# Patient Record
Sex: Female | Born: 1945
Health system: Southern US, Community
[De-identification: ages and names within clinical notes are randomized; demographics above are authoritative.]

## PROBLEM LIST (undated history)

## (undated) DIAGNOSIS — K0889 Other specified disorders of teeth and supporting structures: Secondary | ICD-10-CM

## (undated) DIAGNOSIS — K219 Gastro-esophageal reflux disease without esophagitis: Secondary | ICD-10-CM

## (undated) DIAGNOSIS — Z8719 Personal history of other diseases of the digestive system: Secondary | ICD-10-CM

## (undated) DIAGNOSIS — G8929 Other chronic pain: Secondary | ICD-10-CM

## (undated) DIAGNOSIS — J302 Other seasonal allergic rhinitis: Secondary | ICD-10-CM

## (undated) DIAGNOSIS — R112 Nausea with vomiting, unspecified: Secondary | ICD-10-CM

## (undated) DIAGNOSIS — R0602 Shortness of breath: Secondary | ICD-10-CM

## (undated) DIAGNOSIS — R634 Abnormal weight loss: Secondary | ICD-10-CM

## (undated) DIAGNOSIS — Z89432 Acquired absence of left foot: Secondary | ICD-10-CM

## (undated) DIAGNOSIS — M549 Dorsalgia, unspecified: Secondary | ICD-10-CM

## (undated) DIAGNOSIS — E46 Unspecified protein-calorie malnutrition: Secondary | ICD-10-CM

## (undated) DIAGNOSIS — Z789 Other specified health status: Secondary | ICD-10-CM

## (undated) DIAGNOSIS — K649 Unspecified hemorrhoids: Secondary | ICD-10-CM

## (undated) DIAGNOSIS — H9190 Unspecified hearing loss, unspecified ear: Secondary | ICD-10-CM

## (undated) DIAGNOSIS — M1990 Unspecified osteoarthritis, unspecified site: Secondary | ICD-10-CM

## (undated) DIAGNOSIS — J690 Pneumonitis due to inhalation of food and vomit: Secondary | ICD-10-CM

## (undated) DIAGNOSIS — R159 Full incontinence of feces: Secondary | ICD-10-CM

## (undated) DIAGNOSIS — K8020 Calculus of gallbladder without cholecystitis without obstruction: Secondary | ICD-10-CM

## (undated) DIAGNOSIS — M545 Low back pain, unspecified: Secondary | ICD-10-CM

## (undated) DIAGNOSIS — R600 Localized edema: Secondary | ICD-10-CM

## (undated) DIAGNOSIS — R269 Unspecified abnormalities of gait and mobility: Secondary | ICD-10-CM

## (undated) DIAGNOSIS — M419 Scoliosis, unspecified: Secondary | ICD-10-CM

## (undated) DIAGNOSIS — R32 Unspecified urinary incontinence: Secondary | ICD-10-CM

## (undated) DIAGNOSIS — D649 Anemia, unspecified: Secondary | ICD-10-CM

## (undated) DIAGNOSIS — Z9289 Personal history of other medical treatment: Secondary | ICD-10-CM

## (undated) DIAGNOSIS — Z9889 Other specified postprocedural states: Secondary | ICD-10-CM

## (undated) DIAGNOSIS — J42 Unspecified chronic bronchitis: Secondary | ICD-10-CM

## (undated) DIAGNOSIS — K56609 Unspecified intestinal obstruction, unspecified as to partial versus complete obstruction: Secondary | ICD-10-CM

## (undated) HISTORY — PX: CATARACT EXTRACTION W/ INTRAOCULAR LENS  IMPLANT, BILATERAL: SHX1307

## (undated) HISTORY — PX: TONSILLECTOMY AND ADENOIDECTOMY: SUR1326

## (undated) HISTORY — PX: KNEE ARTHROSCOPY: SUR90

## (undated) HISTORY — PX: APPENDECTOMY: SHX54

## (undated) HISTORY — PX: BLADDER REPAIR: SHX76

## (undated) HISTORY — PX: DOBUTAMINE STRESS ECHO: SHX5426

---

## 1973-07-15 DIAGNOSIS — Z9289 Personal history of other medical treatment: Secondary | ICD-10-CM

## 1973-07-15 HISTORY — DX: Personal history of other medical treatment: Z92.89

## 1973-07-15 HISTORY — PX: EXPLORATORY LAPAROTOMY: SUR591

## 1973-07-15 HISTORY — PX: ABDOMINAL HYSTERECTOMY: SHX81

## 1987-07-16 HISTORY — PX: BREAST LUMPECTOMY: SHX2

## 2004-09-20 ENCOUNTER — Other Ambulatory Visit: Admission: RE | Admit: 2004-09-20 | Discharge: 2004-09-20 | Payer: Self-pay | Admitting: Family Medicine

## 2011-08-06 ENCOUNTER — Other Ambulatory Visit: Payer: Self-pay | Admitting: Family Medicine

## 2011-08-06 DIAGNOSIS — R634 Abnormal weight loss: Secondary | ICD-10-CM

## 2011-08-12 ENCOUNTER — Ambulatory Visit
Admission: RE | Admit: 2011-08-12 | Discharge: 2011-08-12 | Disposition: A | Discharge status: Home / Self Care | Payer: Medicare Other | Source: Ambulatory Visit | Attending: Family Medicine | Admitting: Family Medicine

## 2011-08-12 ENCOUNTER — Other Ambulatory Visit: Payer: Self-pay | Admitting: Family Medicine

## 2011-08-12 ENCOUNTER — Other Ambulatory Visit: Payer: Self-pay

## 2011-08-12 DIAGNOSIS — R634 Abnormal weight loss: Secondary | ICD-10-CM

## 2011-08-12 MED ORDER — IOHEXOL 300 MG/ML  SOLN
100.0000 mL | Freq: Once | INTRAMUSCULAR | Status: AC | PRN
  Administered 2011-08-12: 100 mL via INTRAVENOUS

## 2011-09-23 ENCOUNTER — Encounter (HOSPITAL_BASED_OUTPATIENT_CLINIC_OR_DEPARTMENT_OTHER): Payer: Medicare Other | Attending: Internal Medicine

## 2011-09-23 DIAGNOSIS — Z9089 Acquired absence of other organs: Secondary | ICD-10-CM | POA: Insufficient documentation

## 2011-09-23 DIAGNOSIS — I872 Venous insufficiency (chronic) (peripheral): Secondary | ICD-10-CM | POA: Insufficient documentation

## 2011-09-23 DIAGNOSIS — Z79899 Other long term (current) drug therapy: Secondary | ICD-10-CM | POA: Insufficient documentation

## 2011-09-23 DIAGNOSIS — Z90710 Acquired absence of both cervix and uterus: Secondary | ICD-10-CM | POA: Insufficient documentation

## 2011-09-23 DIAGNOSIS — F17200 Nicotine dependence, unspecified, uncomplicated: Secondary | ICD-10-CM | POA: Insufficient documentation

## 2011-09-23 DIAGNOSIS — L97509 Non-pressure chronic ulcer of other part of unspecified foot with unspecified severity: Secondary | ICD-10-CM | POA: Insufficient documentation

## 2011-09-23 DIAGNOSIS — J4489 Other specified chronic obstructive pulmonary disease: Secondary | ICD-10-CM | POA: Insufficient documentation

## 2011-09-23 DIAGNOSIS — J449 Chronic obstructive pulmonary disease, unspecified: Secondary | ICD-10-CM | POA: Insufficient documentation

## 2011-09-27 NOTE — Progress Notes (Signed)
[facility]  NAME:  Ashley Howard, Ashley Howard NO.:  [id number]  MEDICAL RECORD NO.:  [id number]      DATE OF BIRTH:  1945-11-05  PHYSICIAN:  Ardath Sax, M.D.           VISIT DATE:                                  OFFICE VISIT   This is a 66 year old lady who was sent here by her family doctor because of chronic venous stasis ulcer on her left foot.  She has a history of being a smoker, has COPD.  She has a history of appendectomy, a hysterectomy, a lumpectomy of her breast, left knee surgery, and cataract surgery.  She is on several medicines including Lasix and Aldactone, and lisinopril.  She was examined here today with the findings of about a 2 x 2 cm ulcer on the first MP area of the left foot.  She has obvious venous stasis bilaterally and after going over her physically and deciding on what to do, we put her in Unna boots with collagen on the wound on her left foot, and we did debride some callus from the wound, and we will see her here in a week.  We also cultured the area and put her on a 2-week course of doxycycline.  So, our diagnosis is venous stasis ulcers, and we will see her here in a week.     Ardath Sax, M.D.     PP/MEDQ  D:  09/27/2011  T:  09/27/2011  Job:  [id number]

## 2011-10-14 ENCOUNTER — Encounter (HOSPITAL_BASED_OUTPATIENT_CLINIC_OR_DEPARTMENT_OTHER): Payer: Medicare Other | Attending: Internal Medicine

## 2011-10-14 DIAGNOSIS — L97509 Non-pressure chronic ulcer of other part of unspecified foot with unspecified severity: Secondary | ICD-10-CM | POA: Insufficient documentation

## 2011-10-14 DIAGNOSIS — I872 Venous insufficiency (chronic) (peripheral): Secondary | ICD-10-CM | POA: Insufficient documentation

## 2011-10-14 DIAGNOSIS — J4489 Other specified chronic obstructive pulmonary disease: Secondary | ICD-10-CM | POA: Insufficient documentation

## 2011-10-14 DIAGNOSIS — Z90710 Acquired absence of both cervix and uterus: Secondary | ICD-10-CM | POA: Insufficient documentation

## 2011-10-14 DIAGNOSIS — J449 Chronic obstructive pulmonary disease, unspecified: Secondary | ICD-10-CM | POA: Insufficient documentation

## 2011-10-21 ENCOUNTER — Encounter (HOSPITAL_BASED_OUTPATIENT_CLINIC_OR_DEPARTMENT_OTHER): Payer: Medicare Other

## 2011-11-06 ENCOUNTER — Other Ambulatory Visit (HOSPITAL_COMMUNITY): Payer: Self-pay | Admitting: Orthopedic Surgery

## 2011-11-06 ENCOUNTER — Encounter (HOSPITAL_COMMUNITY): Payer: Self-pay | Admitting: Respiratory Therapy

## 2011-11-06 ENCOUNTER — Encounter (HOSPITAL_COMMUNITY): Payer: Self-pay | Admitting: *Deleted

## 2011-11-06 MED ORDER — CEFAZOLIN SODIUM 1-5 GM-% IV SOLN
1.0000 g | INTRAVENOUS | Status: AC
  Administered 2011-11-07: 1 g via INTRAVENOUS
  Filled 2011-11-06: qty 50

## 2011-11-07 ENCOUNTER — Encounter (HOSPITAL_COMMUNITY): Payer: Self-pay | Admitting: Certified Registered Nurse Anesthetist

## 2011-11-07 ENCOUNTER — Encounter (HOSPITAL_COMMUNITY): Payer: Self-pay | Admitting: *Deleted

## 2011-11-07 ENCOUNTER — Ambulatory Visit (HOSPITAL_COMMUNITY): Payer: Medicare Other

## 2011-11-07 ENCOUNTER — Inpatient Hospital Stay (HOSPITAL_COMMUNITY)
Admission: RE | Admit: 2011-11-07 | Discharge: 2011-11-11 | DRG: 505 | Disposition: A | Discharge status: Skilled Nursing Facility | Payer: Medicare Other | Source: Ambulatory Visit | Attending: Orthopedic Surgery | Admitting: Orthopedic Surgery

## 2011-11-07 ENCOUNTER — Encounter (HOSPITAL_COMMUNITY)
Admission: RE | Disposition: A | Discharge status: Skilled Nursing Facility | Payer: Self-pay | Source: Ambulatory Visit | Attending: Orthopedic Surgery

## 2011-11-07 ENCOUNTER — Ambulatory Visit (HOSPITAL_COMMUNITY): Payer: Medicare Other | Admitting: Certified Registered Nurse Anesthetist

## 2011-11-07 DIAGNOSIS — R32 Unspecified urinary incontinence: Secondary | ICD-10-CM | POA: Diagnosis present

## 2011-11-07 DIAGNOSIS — M869 Osteomyelitis, unspecified: Principal | ICD-10-CM | POA: Diagnosis present

## 2011-11-07 DIAGNOSIS — K219 Gastro-esophageal reflux disease without esophagitis: Secondary | ICD-10-CM | POA: Diagnosis present

## 2011-11-07 DIAGNOSIS — M86179 Other acute osteomyelitis, unspecified ankle and foot: Secondary | ICD-10-CM

## 2011-11-07 DIAGNOSIS — Z888 Allergy status to other drugs, medicaments and biological substances status: Secondary | ICD-10-CM

## 2011-11-07 DIAGNOSIS — M4120 Other idiopathic scoliosis, site unspecified: Secondary | ICD-10-CM | POA: Diagnosis present

## 2011-11-07 DIAGNOSIS — Z9849 Cataract extraction status, unspecified eye: Secondary | ICD-10-CM

## 2011-11-07 DIAGNOSIS — M129 Arthropathy, unspecified: Secondary | ICD-10-CM | POA: Diagnosis present

## 2011-11-07 HISTORY — DX: Nausea with vomiting, unspecified: Z98.890

## 2011-11-07 HISTORY — PX: AMPUTATION: SHX166

## 2011-11-07 HISTORY — DX: Shortness of breath: R06.02

## 2011-11-07 HISTORY — DX: Unspecified osteoarthritis, unspecified site: M19.90

## 2011-11-07 HISTORY — DX: Calculus of gallbladder without cholecystitis without obstruction: K80.20

## 2011-11-07 HISTORY — DX: Unspecified urinary incontinence: R32

## 2011-11-07 HISTORY — DX: Abnormal weight loss: R63.4

## 2011-11-07 HISTORY — DX: Nausea with vomiting, unspecified: R11.2

## 2011-11-07 HISTORY — DX: Other seasonal allergic rhinitis: J30.2

## 2011-11-07 HISTORY — DX: Gastro-esophageal reflux disease without esophagitis: K21.9

## 2011-11-07 HISTORY — DX: Personal history of other diseases of the digestive system: Z87.19

## 2011-11-07 HISTORY — DX: Localized edema: R60.0

## 2011-11-07 HISTORY — DX: Full incontinence of feces: R15.9

## 2011-11-07 HISTORY — DX: Scoliosis, unspecified: M41.9

## 2011-11-07 LAB — CBC
HCT: 34 % — ABNORMAL LOW (ref 36.0–46.0)
Hemoglobin: 11.9 g/dL — ABNORMAL LOW (ref 12.0–15.0)
MCH: 34.4 pg — ABNORMAL HIGH (ref 26.0–34.0)
RBC: 3.46 MIL/uL — ABNORMAL LOW (ref 3.87–5.11)

## 2011-11-07 LAB — COMPREHENSIVE METABOLIC PANEL
ALT: 21 U/L (ref 0–35)
Alkaline Phosphatase: 96 U/L (ref 39–117)
BUN: 22 mg/dL (ref 6–23)
CO2: 25 mEq/L (ref 19–32)
Calcium: 9.7 mg/dL (ref 8.4–10.5)
GFR calc Af Amer: 83 mL/min — ABNORMAL LOW (ref >90)
GFR calc non Af Amer: 71 mL/min — ABNORMAL LOW (ref >90)
Glucose, Bld: 101 mg/dL — ABNORMAL HIGH (ref 70–99)
Potassium: 3.7 mEq/L (ref 3.5–5.1)
Sodium: 137 mEq/L (ref 135–145)

## 2011-11-07 SURGERY — AMPUTATION, FOOT, RAY
Anesthesia: General | Site: Foot | Laterality: Left | Wound class: Clean

## 2011-11-07 MED ORDER — SODIUM CHLORIDE 0.9 % IV SOLN
INTRAVENOUS | Status: DC
  Administered 2011-11-07: 20 mL/h via INTRAVENOUS

## 2011-11-07 MED ORDER — ADULT MULTIVITAMIN W/MINERALS CH
1.0000 | ORAL_TABLET | Freq: Every day | ORAL | Status: DC
  Administered 2011-11-07 – 2011-11-11 (×5): 1 via ORAL
  Filled 2011-11-07 (×5): qty 1

## 2011-11-07 MED ORDER — MUPIROCIN CALCIUM 2 % EX CREA
TOPICAL_CREAM | Freq: Two times a day (BID) | CUTANEOUS | Status: AC
  Filled 2011-11-07: qty 15

## 2011-11-07 MED ORDER — 0.9 % SODIUM CHLORIDE (POUR BTL) OPTIME
TOPICAL | Status: DC | PRN
  Administered 2011-11-07: 1000 mL

## 2011-11-07 MED ORDER — EPHEDRINE SULFATE 50 MG/ML IJ SOLN
INTRAMUSCULAR | Status: DC | PRN
  Administered 2011-11-07: 10 mg via INTRAVENOUS

## 2011-11-07 MED ORDER — SPIRONOLACTONE 25 MG PO TABS
25.0000 mg | ORAL_TABLET | Freq: Every day | ORAL | Status: DC
  Administered 2011-11-07 – 2011-11-11 (×4): 25 mg via ORAL
  Filled 2011-11-07 (×5): qty 1

## 2011-11-07 MED ORDER — ONDANSETRON HCL 4 MG/2ML IJ SOLN
INTRAMUSCULAR | Status: DC | PRN
  Administered 2011-11-07: 4 mg via INTRAVENOUS

## 2011-11-07 MED ORDER — MIDAZOLAM HCL 5 MG/5ML IJ SOLN
INTRAMUSCULAR | Status: DC | PRN
  Administered 2011-11-07: 1 mg via INTRAVENOUS

## 2011-11-07 MED ORDER — DEXTROSE 5 % IV SOLN
500.0000 mg | Freq: Four times a day (QID) | INTRAVENOUS | Status: DC | PRN
  Filled 2011-11-07: qty 5

## 2011-11-07 MED ORDER — HYDROMORPHONE HCL PF 1 MG/ML IJ SOLN: 0.5000 mg | INTRAMUSCULAR | Status: DC | PRN

## 2011-11-07 MED ORDER — METHOCARBAMOL 500 MG PO TABS
500.0000 mg | ORAL_TABLET | Freq: Four times a day (QID) | ORAL | Status: DC | PRN
  Filled 2011-11-07 (×2): qty 1

## 2011-11-07 MED ORDER — ONDANSETRON HCL 4 MG/2ML IJ SOLN: 4.0000 mg | Freq: Four times a day (QID) | INTRAMUSCULAR | Status: DC | PRN

## 2011-11-07 MED ORDER — CEFAZOLIN SODIUM 1-5 GM-% IV SOLN
1.0000 g | Freq: Four times a day (QID) | INTRAVENOUS | Status: AC
  Administered 2011-11-07 – 2011-11-08 (×3): 1 g via INTRAVENOUS
  Filled 2011-11-07 (×3): qty 50

## 2011-11-07 MED ORDER — ONDANSETRON HCL 4 MG PO TABS: 4.0000 mg | ORAL_TABLET | Freq: Four times a day (QID) | ORAL | Status: DC | PRN

## 2011-11-07 MED ORDER — METHOCARBAMOL 500 MG PO TABS
500.0000 mg | ORAL_TABLET | Freq: Four times a day (QID) | ORAL | Status: DC
  Administered 2011-11-07 – 2011-11-11 (×16): 500 mg via ORAL
  Filled 2011-11-07 (×20): qty 1

## 2011-11-07 MED ORDER — MUPIROCIN 2 % EX OINT
TOPICAL_OINTMENT | CUTANEOUS | Status: AC
  Administered 2011-11-07: 1
  Filled 2011-11-07: qty 22

## 2011-11-07 MED ORDER — BISACODYL 5 MG PO TBEC
5.0000 mg | DELAYED_RELEASE_TABLET | ORAL | Status: DC
  Administered 2011-11-07 – 2011-11-09 (×2): 5 mg via ORAL
  Filled 2011-11-07 (×3): qty 1

## 2011-11-07 MED ORDER — OXYCODONE HCL 5 MG PO TABS
5.0000 mg | ORAL_TABLET | ORAL | Status: DC | PRN
  Administered 2011-11-07 – 2011-11-11 (×13): 5 mg via ORAL
  Filled 2011-11-07 (×13): qty 1

## 2011-11-07 MED ORDER — OXYCODONE-ACETAMINOPHEN 5-325 MG PO TABS: 1.0000 | ORAL_TABLET | ORAL | Status: DC | PRN

## 2011-11-07 MED ORDER — DOXYCYCLINE HYCLATE 100 MG PO TABS
100.0000 mg | ORAL_TABLET | Freq: Two times a day (BID) | ORAL | Status: DC
  Administered 2011-11-07 – 2011-11-11 (×9): 100 mg via ORAL
  Filled 2011-11-07 (×11): qty 1

## 2011-11-07 MED ORDER — FUROSEMIDE 20 MG PO TABS
20.0000 mg | ORAL_TABLET | Freq: Every day | ORAL | Status: DC
  Administered 2011-11-07 – 2011-11-11 (×4): 20 mg via ORAL
  Filled 2011-11-07 (×5): qty 1

## 2011-11-07 MED ORDER — CALCIUM CARBONATE ANTACID 500 MG PO CHEW
1.0000 | CHEWABLE_TABLET | Freq: Three times a day (TID) | ORAL | Status: DC
  Administered 2011-11-07 – 2011-11-11 (×12): 200 mg via ORAL
  Filled 2011-11-07 (×15): qty 1

## 2011-11-07 MED ORDER — HYDROMORPHONE HCL PF 1 MG/ML IJ SOLN
0.2500 mg | INTRAMUSCULAR | Status: DC | PRN
  Administered 2011-11-07 (×3): 0.5 mg via INTRAVENOUS

## 2011-11-07 MED ORDER — FENTANYL CITRATE 0.05 MG/ML IJ SOLN
INTRAMUSCULAR | Status: DC | PRN
  Administered 2011-11-07: 25 ug via INTRAVENOUS
  Administered 2011-11-07: 100 ug via INTRAVENOUS
  Administered 2011-11-07: 25 ug via INTRAVENOUS

## 2011-11-07 MED ORDER — METOCLOPRAMIDE HCL 5 MG/ML IJ SOLN: 5.0000 mg | Freq: Three times a day (TID) | INTRAMUSCULAR | Status: DC | PRN

## 2011-11-07 MED ORDER — HYDROMORPHONE HCL PF 1 MG/ML IJ SOLN
INTRAMUSCULAR | Status: AC
  Filled 2011-11-07: qty 1

## 2011-11-07 MED ORDER — LACTATED RINGERS IV SOLN
INTRAVENOUS | Status: DC | PRN
  Administered 2011-11-07: 08:00:00 via INTRAVENOUS

## 2011-11-07 MED ORDER — HYDROCODONE-ACETAMINOPHEN 5-325 MG PO TABS: 1.0000 | ORAL_TABLET | ORAL | Status: DC | PRN

## 2011-11-07 MED ORDER — METOCLOPRAMIDE HCL 10 MG PO TABS: 5.0000 mg | ORAL_TABLET | Freq: Three times a day (TID) | ORAL | Status: DC | PRN

## 2011-11-07 MED ORDER — PROPOFOL 10 MG/ML IV EMUL
INTRAVENOUS | Status: DC | PRN
  Administered 2011-11-07: 120 mg via INTRAVENOUS

## 2011-11-07 SURGICAL SUPPLY — 49 items
BANDAGE ESMARK 6X9 LF (GAUZE/BANDAGES/DRESSINGS) IMPLANT
BANDAGE GAUZE ELAST BULKY 4 IN (GAUZE/BANDAGES/DRESSINGS) ×1 IMPLANT
BLADE SAW SGTL MED 73X18.5 STR (BLADE) IMPLANT
BNDG CMPR 9X6 STRL LF SNTH (GAUZE/BANDAGES/DRESSINGS)
BNDG COHESIVE 4X5 TAN STRL (GAUZE/BANDAGES/DRESSINGS) ×2 IMPLANT
BNDG COHESIVE 6X5 TAN STRL LF (GAUZE/BANDAGES/DRESSINGS) ×1 IMPLANT
BNDG ESMARK 6X9 LF (GAUZE/BANDAGES/DRESSINGS)
CLOTH BEACON ORANGE TIMEOUT ST (SAFETY) ×2 IMPLANT
CUFF TOURNIQUET SINGLE 34IN LL (TOURNIQUET CUFF) IMPLANT
CUFF TOURNIQUET SINGLE 44IN (TOURNIQUET CUFF) IMPLANT
DRAPE U-SHAPE 47X51 STRL (DRAPES) ×3 IMPLANT
DRSG ADAPTIC 3X8 NADH LF (GAUZE/BANDAGES/DRESSINGS) ×1 IMPLANT
DRSG EMULSION OIL 3X3 NADH (GAUZE/BANDAGES/DRESSINGS) ×1 IMPLANT
DRSG PAD ABDOMINAL 8X10 ST (GAUZE/BANDAGES/DRESSINGS) ×1 IMPLANT
DURAPREP 26ML APPLICATOR (WOUND CARE) ×2 IMPLANT
ELECT REM PT RETURN 9FT ADLT (ELECTROSURGICAL) ×2
ELECTRODE REM PT RTRN 9FT ADLT (ELECTROSURGICAL) ×1 IMPLANT
GLOVE BIOGEL PI IND STRL 7.0 (GLOVE) IMPLANT
GLOVE BIOGEL PI IND STRL 8 (GLOVE) IMPLANT
GLOVE BIOGEL PI IND STRL 9 (GLOVE) ×1 IMPLANT
GLOVE BIOGEL PI INDICATOR 7.0 (GLOVE) ×1
GLOVE BIOGEL PI INDICATOR 8 (GLOVE) ×1
GLOVE BIOGEL PI INDICATOR 9 (GLOVE) ×1
GLOVE SURG ORTHO 9.0 STRL STRW (GLOVE) ×2 IMPLANT
GLOVE SURG SS PI 7.0 STRL IVOR (GLOVE) ×1 IMPLANT
GLOVE SURG SS PI 7.5 STRL IVOR (GLOVE) ×1 IMPLANT
GOWN PREVENTION PLUS XLARGE (GOWN DISPOSABLE) ×1 IMPLANT
GOWN SRG XL XLNG 56XLVL 4 (GOWN DISPOSABLE) ×1 IMPLANT
GOWN STRL NON-REIN XL XLG LVL4 (GOWN DISPOSABLE) ×4
KIT BASIN OR (CUSTOM PROCEDURE TRAY) ×2 IMPLANT
KIT ROOM TURNOVER OR (KITS) ×2 IMPLANT
MANIFOLD NEPTUNE II (INSTRUMENTS) ×2 IMPLANT
NS IRRIG 1000ML POUR BTL (IV SOLUTION) ×2 IMPLANT
PACK ORTHO EXTREMITY (CUSTOM PROCEDURE TRAY) ×2 IMPLANT
PAD ARMBOARD 7.5X6 YLW CONV (MISCELLANEOUS) ×3 IMPLANT
PAD CAST 4YDX4 CTTN HI CHSV (CAST SUPPLIES) ×1 IMPLANT
PADDING CAST COTTON 4X4 STRL (CAST SUPPLIES)
SPECIMEN JAR MEDIUM (MISCELLANEOUS) ×1 IMPLANT
SPONGE GAUZE 4X4 12PLY (GAUZE/BANDAGES/DRESSINGS) ×2 IMPLANT
SPONGE LAP 18X18 X RAY DECT (DISPOSABLE) ×3 IMPLANT
STAPLER VISISTAT 35W (STAPLE) ×1 IMPLANT
STOCKINETTE IMPERVIOUS LG (DRAPES) IMPLANT
SUCTION FRAZIER TIP 10 FR DISP (SUCTIONS) ×2 IMPLANT
SUT ETHILON 2 0 PSLX (SUTURE) ×3 IMPLANT
TOWEL OR 17X24 6PK STRL BLUE (TOWEL DISPOSABLE) ×2 IMPLANT
TOWEL OR 17X26 10 PK STRL BLUE (TOWEL DISPOSABLE) ×2 IMPLANT
TUBE CONNECTING 12X1/4 (SUCTIONS) ×2 IMPLANT
UNDERPAD 30X30 INCONTINENT (UNDERPADS AND DIAPERS) ×2 IMPLANT
WATER STERILE IRR 1000ML POUR (IV SOLUTION) ×1 IMPLANT

## 2011-11-07 NOTE — Anesthesia Postprocedure Evaluation (Signed)
  Anesthesia Post-op Note  Patient: Ashley Howard  Procedure(s) Performed: Procedure(s) (LRB): AMPUTATION RAY (Left)  Patient Location: PACU  Anesthesia Type: General  Level of Consciousness: awake, alert  and oriented  Airway and Oxygen Therapy: Patient Spontanous Breathing  Post-op Pain: mild  Post-op Assessment: Post-op Vital signs reviewed, Patient's Cardiovascular Status Stable, Respiratory Function Stable, Patent Airway, No signs of Nausea or vomiting and Pain level controlled  Post-op Vital Signs: Reviewed and stable  Complications: No apparent anesthesia complications

## 2011-11-07 NOTE — Preoperative (Signed)
Beta Blockers   Reason not to administer Beta Blockers:Not Applicable 

## 2011-11-07 NOTE — Anesthesia Procedure Notes (Signed)
Procedure Name: LMA Insertion Date/Time: 11/07/2011 7:56 AM Performed by: Rogelia Boga Pre-anesthesia Checklist: Patient identified, Emergency Drugs available, Suction available, Patient being monitored and Timeout performed Patient Re-evaluated:Patient Re-evaluated prior to inductionOxygen Delivery Method: Circle system utilized Preoxygenation: Pre-oxygenation with 100% oxygen Intubation Type: IV induction LMA Size: 4.0 Number of attempts: 1 Placement Confirmation: positive ETCO2 and breath sounds checked- equal and bilateral Tube secured with: Tape Dental Injury: Teeth and Oropharynx as per pre-operative assessment

## 2011-11-07 NOTE — Progress Notes (Signed)
Orthopedic Tech Progress Note Patient Details:  Ashley Howard [birth year] [id number]  Other Ortho Devices Type of Ortho Device: Postop boot Ortho Device Interventions: Application   Cammer, Mickie Bail 11/07/2011, 9:05 AM

## 2011-11-07 NOTE — Transfer of Care (Signed)
Immediate Anesthesia Transfer of Care Note  Patient: Ashley Howard  Procedure(s) Performed: Procedure(s) (LRB): AMPUTATION RAY (Left)  Patient Location: PACU  Anesthesia Type: General  Level of Consciousness: awake, oriented and patient cooperative  Airway & Oxygen Therapy: Patient Spontanous Breathing and Patient connected to nasal cannula oxygen  Post-op Assessment: Report given to PACU RN  Post vital signs: Reviewed and stable  Complications: No apparent anesthesia complications

## 2011-11-07 NOTE — H&P (Signed)
Ashley Howard is an 66 y.o. female.   Chief Complaint: Abscess ulcer infection foot HPI: Patient is a 66 year old woman who presents with ulcer abscess of the great toe with foul smelling drainage exposed bone failed conservative treatment  Past Medical History  Diagnosis Date  . Incontinence of urine     wears adult briefs  . Gallstones   . Fluid retention in legs     wears compression stocking R leg  . PONV (postoperative nausea and vomiting)   . Seasonal allergies   . Weight loss, unintentional     40 lbs in last year  . Shortness of breath     with pain  . GERD (gastroesophageal reflux disease)   . H/O hiatal hernia   . Arthritis   . Scoliosis   . Bowel incontinence     Past Surgical History  Procedure Date  . Knee arthroscopy     left  . Dobutamine stress echo   . Breast surgery 1989    lumpectomy bilat  . Tonsillectomy   . Adenoidectomy   . Eye surgery     bilat cataracts  . Appendectomy   . Abdominal hysterectomy 1975  . Bladder repair     History reviewed. No pertinent family history. Social History:  reports that she has been smoking.  She does not have any smokeless tobacco history on file. She reports that she drinks alcohol. She reports that she does not use illicit drugs.  Allergies:  Allergies  Allergen Reactions  . Tape Other (See Comments)    Paper tape - blisters  . Tylenol (Acetaminophen) Other (See Comments)    Stop from urinating     Medications Prior to Admission  Medication Sig Dispense Refill  . bisacodyl (DULCOLAX) 5 MG EC tablet Take 5 mg by mouth once a week.      . doxycycline (VIBRA-TABS) 100 MG tablet Take 100 mg by mouth 2 (two) times daily.      . furosemide (LASIX) 20 MG tablet Take 20 mg by mouth daily.      Marland Kitchen guaifenesin (ROBITUSSIN) 100 MG/5ML syrup Take 200 mg by mouth daily as needed.      . methocarbamol (ROBAXIN) 500 MG tablet Take 500 mg by mouth 4 (four) times daily.      . naproxen sodium (ANAPROX) 220 MG  tablet Take 440 mg by mouth 3 (three) times daily with meals.      . pseudoephedrine-guaifenesin (MUCINEX D) 60-600 MG per tablet Take 1 tablet by mouth every 12 (twelve) hours.      Marland Kitchen spironolactone (ALDACTONE) 25 MG tablet Take 25 mg by mouth daily.      . calcium carbonate (TUMS - DOSED IN MG ELEMENTAL CALCIUM) 500 MG chewable tablet Chew 1 tablet by mouth 3 (three) times daily before meals.      . Multiple Vitamin (MULITIVITAMIN WITH MINERALS) TABS Take 1 tablet by mouth daily.        Results for orders placed during the hospital encounter of 11/07/11 (from the past 48 hour(s))  APTT     Status: Normal   Collection Time   11/07/11  6:51 AM      Component Value Range Comment   aPTT 34  24 - 37 (seconds)    No results found.  Review of Systems  All other systems reviewed and are negative.    Blood pressure 114/68, pulse 78, temperature 97.9 F (36.6 C), temperature source Oral, resp. rate 20, height 5\' 2"  (1.575  m), weight 48.535 kg (107 lb), SpO2 99.00%. Physical Exam  Examination patient does have good pulses, she has a Wagner grade 3 ulcer on the plantar aspect of the MTP joint. The ulcer also does probe to bone. Patient does have radiographic findings which showed lytic changes at the MTP joint. Assessment/Plan Assessment: Osteomyelitis MTP joint of the foot.  Plan: Plan for first ray amputation. Risks and benefits were discussed including infection neurovascular injury nonhealing of the wound need for additional surgery. Patient states she understands and wishes to proceed at this time.  Ashley Howard V 11/07/2011, 7:20 AM

## 2011-11-07 NOTE — Op Note (Signed)
OPERATIVE REPORT  DATE OF SURGERY: 11/07/2011  PATIENT:  Ashley Howard,  66 y.o. female  PRE-OPERATIVE DIAGNOSIS:  Abscess and Osteomyelitis Left Great Toe MTP Joint  POST-OPERATIVE DIAGNOSIS:  Abscess and Osteomyelitis Left Great Toe MTP Joint  PROCEDURE:  Procedure(s): AMPUTATION RAY Left foot first ray  SURGEON:  Surgeon(s): Nadara Mustard, MD  ANESTHESIA:   general  EBL:  Minimal ML  SPECIMEN:  No Specimen and Source of Specimen:  Left foot first ray  TOURNIQUET:  * No tourniquets in log *  PROCEDURE DETAILS: Patient is a 66 year old woman with insensate neuropathy with [name] grade 3 ulcer left foot great toe MTP joint. She has failed conservative care radiograph shows osteomyelitis the ulcer probes to bone she has good pulses and she presents at this time for amputation of the first ray after failure of conservative care. Risks and benefits were discussed including infection neurovascular injury persistent pain nonhealing of the wound need for additional surgery. Patient states she understands and wished to proceed at this time. Description of procedure patient was brought to OR room 15 and underwent a general anesthetic. After adequate levels of anesthesia were obtained patient's left foot was prepped using DuraPrep and draped into a sterile field. A racquet incision was made around the ulcer the first ray was resected at the base of the first metatarsal. The wound was irrigated with normal saline hemostasis was obtained. The incision was closed using 2-0 nylon. The wound was covered with Adaptic orthopedic sponges ABDs dressing Kerlix and Coban. Patient was extubated and taken to the PACU in stable condition.  PLAN OF CARE: Admit to inpatient   PATIENT DISPOSITION:  PACU - hemodynamically stable.   Nadara Mustard, MD 11/07/2011 8:23 AM

## 2011-11-07 NOTE — Anesthesia Preprocedure Evaluation (Addendum)
Anesthesia Evaluation  Patient identified by MRN, date of birth, ID band Patient awake    Reviewed: Allergy & Precautions, H&P , NPO status , Patient's Chart, lab work & pertinent test results  History of Anesthesia Complications (+) PONV  Airway Mallampati: II TM Distance: >3 FB Neck ROM: Full    Dental  (+) Dental Advisory Given, Loose and Poor Dentition   Pulmonary shortness of breath and with exertion, Current Smoker,  breath sounds clear to auscultation  Pulmonary exam normal       Cardiovascular hypertension (on diurectics), Pt. on medications + Peripheral Vascular Disease negative cardio ROS  Rhythm:Regular Rate:Normal     Neuro/Psych Chronic pain negative psych ROS   GI/Hepatic hiatal hernia, GERD-  Medicated and Controlled,H/o elevated LFTs   Endo/Other  negative endocrine ROS  Renal/GU negative Renal ROS     Musculoskeletal  (+) Arthritis -, Osteoarthritis,    Abdominal   Peds  Hematology   Anesthesia Other Findings   Reproductive/Obstetrics                          Anesthesia Physical Anesthesia Plan  ASA: III  Anesthesia Plan: General   Post-op Pain Management:    Induction: Intravenous  Airway Management Planned: LMA  Additional Equipment:   Intra-op Plan:   Post-operative Plan:   Informed Consent: I have reviewed the patients History and Physical, chart, labs and discussed the procedure including the risks, benefits and alternatives for the proposed anesthesia with the patient or authorized representative who has indicated his/her understanding and acceptance.   Dental advisory given  Plan Discussed with: Surgeon and CRNA  Anesthesia Plan Comments: (Plan routine monitors, GA- LMA OK)       Anesthesia Quick Evaluation

## 2011-11-08 ENCOUNTER — Encounter (HOSPITAL_COMMUNITY): Payer: Self-pay | Admitting: Orthopedic Surgery

## 2011-11-08 MED ORDER — MUPIROCIN 2 % EX OINT
TOPICAL_OINTMENT | Freq: Two times a day (BID) | CUTANEOUS | Status: DC
  Administered 2011-11-08 – 2011-11-11 (×7): via NASAL
  Filled 2011-11-08: qty 22

## 2011-11-08 MED ORDER — CHLORHEXIDINE GLUCONATE CLOTH 2 % EX PADS
6.0000 | MEDICATED_PAD | Freq: Every day | CUTANEOUS | Status: DC
  Administered 2011-11-08 – 2011-11-09 (×2): 6 via TOPICAL

## 2011-11-08 NOTE — Progress Notes (Signed)
UR Completed.  Sary Bogie Jane [phone] 11/08/2011  

## 2011-11-08 NOTE — Progress Notes (Signed)
Clinical Social Work Department BRIEF PSYCHOSOCIAL ASSESSMENT 11/08/2011  Patient:  Ashley Howard     Account Number:  [id number]     Admit date:  11/07/2011  Clinical Social Worker:  Peggyann Shoals  Date/Time:  11/08/2011 03:30 PM  Referred by:  Physician  Date Referred:  11/08/2011 Referred for  SNF Placement   Other Referral:   Interview type:  Patient Other interview type:    PSYCHOSOCIAL DATA Living Status:  HUSBAND Admitted from facility:   Level of care:   Primary support name:  Ashley Howard Primary support relationship to patient:  SPOUSE Degree of support available:   adequate, however not able to provide support at home.    CURRENT CONCERNS Current Concerns  Post-Acute Placement   Other Concerns:    SOCIAL WORK ASSESSMENT / PLAN CSW met with pt to address cosnult. CSW explained process of SNF as PT is recommending SNF at discharge. Pt lives with her husand and has limited support. Pt is agreeable to SNF   Assessment/plan status:  Other - See comment Other assessment/ plan:   CSW initated SNF search for [facility] and [facility]. CSW will follow up with bed offers. CSW will facilitate discharge to SNF when medically ready.   Information/referral to community resources:   as needed.    PATIENT'S/FAMILY'S RESPONSE TO PLAN OF CARE: Pt was pleasant and oreinted. Pt is agreeable to discharge plan to SNF.   Dede Query, MSW, Theresia Majors [phone]

## 2011-11-08 NOTE — Progress Notes (Addendum)
Clinical Social Work Department CLINICAL SOCIAL WORK PLACEMENT NOTE 11/08/2011  Patient:  Ashley Howard  Account Number:  [id number] Admit date:  11/07/2011  Clinical Social Worker:  Peggyann Shoals  Date/time:  11/08/2011 03:30 PM  Clinical Social Work is seeking post-discharge placement for this patient at the following level of care:   SKILLED NURSING   (*CSW will update this form in Epic as items are completed)   11/08/2011  Patient/family provided with [facility] Department of Clinical Social Work's list of facilities offering this level of care within the geographic area requested by the patient (or if unable, by the patient's family).  11/08/2011  Patient/family informed of their freedom to choose among providers that offer the needed level of care, that participate in Medicare, Medicaid or managed care program needed by the patient, have an available bed and are willing to accept the patient.  11/08/2011  Patient/family informed of [facility]' ownership interest in [facility], as well as of the fact that they are under no obligation to receive care at this facility.  PASARR submitted to EDS on 11/08/2011 PASARR number received from EDS on 11/11/11  FL2 transmitted to all facilities in geographic area requested by pt/family on  11/08/2011 FL2 transmitted to all facilities within larger geographic area on   Patient informed that his/her managed care company has contracts with or will negotiate with  certain facilities, including the following:     Patient/family informed of bed offers received:  11/10/11 [facility] Patient chooses bed at 11/11/11 [facility]B Physician recommends and patient chooses bed at  [facility]  Patient to be transferred to  on  11/11/11 JB Patient to be transferred to facility by [facility] Level  The following physician request were entered in Epic:   Additional Comments:  Dede Query, MSW, LCSWA [phone]

## 2011-11-08 NOTE — Progress Notes (Signed)
Physical Therapy Evaluation Note  Past Medical History  Diagnosis Date  . Incontinence of urine     wears adult briefs  . Gallstones   . Fluid retention in legs     wears compression stocking R leg  . PONV (postoperative nausea and vomiting)   . Seasonal allergies   . Weight loss, unintentional     40 lbs in last year  . Shortness of breath     with pain  . GERD (gastroesophageal reflux disease)   . H/O hiatal hernia   . Arthritis   . Scoliosis   . Bowel incontinence     Past Surgical History  Procedure Date  . Knee arthroscopy     left  . Dobutamine stress echo   . Breast surgery 1989    lumpectomy bilat  . Tonsillectomy   . Adenoidectomy   . Eye surgery     bilat cataracts  . Appendectomy   . Abdominal hysterectomy 1975  . Bladder repair      11/08/11 0909  PT Visit Information  Last PT Received On 11/08/11  PT Time Calculation  PT Start Time 0909  PT Stop Time 0938  PT Time Calculation (min) 29 min  Subjective Data  Subjective Pt received supine in bed with c/o 3/10 L foot pain, throbbing.  Precautions  Precautions Fall  Restrictions  LLE Weight Bearing TWB  Home Living  Lives With Spouse  Available Help at Discharge (none)  Type of Home House  Home Access Stairs to enter  Entrance Stairs-Number of Steps 3  Entrance Stairs-Rails None  Home Layout Two level;Able to live on main level with bedroom/bathroom  Alternate Level Stairs-Number of Steps 12  Alternate Level Stairs-Rails Right  Bathroom Shower/Tub Tub/shower unit  Horticulturist, commercial No  Home Adaptive Equipment (rolling walker)  Additional Comments pt spouse unable to assist patient at home and has no other people to assist  Prior Function  Level of Independence Independent with assistive device(s) (straight cane)  Able to Take Stairs? Yes  Driving No  Vocation Retired  Geneticist, molecular No difficulties  Cognition  Overall Cognitive Status  Appears within functional limits for tasks assessed/performed  Arousal/Alertness Awake/alert  Orientation Level Oriented X4 / Intact  Behavior During Session [facility] for tasks performed  Cognition - Other Comments pt easily distracted  Right Upper Extremity Assessment  RUE ROM/Strength/Tone WFL  Left Upper Extremity Assessment  LUE ROM/Strength/Tone WFL  Right Lower Extremity Assessment  RLE ROM/Strength/Tone WFL  Left Lower Extremity Assessment  LLE ROM/Strength/Tone WFL (except ankle/foot due to surgery)  Trunk Assessment  Trunk Assessment Normal  Bed Mobility  Bed Mobility Rolling Left;Left Sidelying to Sit  Rolling Left 4: Min assist;With rail  Left Sidelying to Sit 4: Min assist;HOB flat  Details for Bed Mobility Assistance v/c's for sequencing, increased time required  Transfers  Transfers Sit to Stand;Stand to Sit  Sit to Stand 4: Min assist;From chair/3-in-1  Stand to Sit 4: Min assist;To chair/3-in-1  Details for Transfer Assistance v/c's for L LW TWB, pt however maintained NWB L LE without difficulty  Ambulation/Gait  Ambulation/Gait Assistance 4: Min assist  Ambulation Distance (Feet) 20 Feet  Assistive device Rolling walker  Ambulation/Gait Assistance Details max directional verbal cues for sequencing of walker due to poor safety awareness  Gait Pattern Step-to pattern  Gait velocity slow  Stairs No  Balance  Balance Assessed Yes  Static Standing Balance  Static Standing - Balance Support Bilateral upper  extremity supported  Static Standing - Level of Assistance 4: Min assist (mod/maxA without UE support)  Static Standing - Comment/# of Minutes pt unsteady with UE support, need bilat UE support  PT - End of Session  Equipment Utilized During Treatment Gait belt (L post op shoe)  Activity Tolerance Patient limited by fatigue;Patient limited by pain  Patient left in chair;with call bell/phone within reach  Nurse Communication (need for SW c/s due to pt unsafe to  return home)  PT Assessment  Clinical Impression Statement Pt s/p  L toe amputation presenting with L LE TWB. Patient requires 24/7 supervision for safety due to increased fall risk and requires assist for ADls. Patient unsafet to return home due to spouse unable to assist patient and has no other people to assist. Patient unable to safely enter home due to spouse inability to hold walker for patient to safely enter stairs or bump patient up in w/c. Patient to benefit from SNF placement to achieve I function for safe transition home.  PT Recommendation/Assessment Patient needs continued PT services  PT Problem List Decreased strength;Decreased range of motion;Decreased activity tolerance;Decreased balance  Barriers to Discharge Inaccessible home environment;Decreased caregiver support  PT Therapy Diagnosis  Difficulty walking;Abnormality of gait;Generalized weakness;Acute pain  PT Plan  PT Frequency Min 5X/week  PT Treatment/Interventions DME instruction;Gait training;Stair training;Functional mobility training;Therapeutic activities;Therapeutic exercise  PT Recommendation  Recommendations for Other Services (Social work c/s)  Follow Up Recommendations Skilled nursing facility;Supervision/Assistance - 24 hour  Equipment Recommended Defer to next venue  Individuals Consulted  Consulted and Agree with Results and Recommendations Patient  Acute Rehab PT Goals  PT Goal Formulation With patient  Time For Goal Achievement 11/22/11  Potential to Achieve Goals Good  Pt will go Supine/Side to Sit with modified independence;with HOB 0 degrees  PT Goal: Supine/Side to Sit - Progress Goal set today  Pt will go Sit to Stand with modified independence (up to RW.)  PT Goal: Sit to Stand - Progress Goal set today  Pt will go Stand to Sit with modified independence  PT Goal: Stand to Sit - Progress Goal set today  Pt will Ambulate 51 - 150 feet;with modified independence;with rolling walker  PT Goal:  Ambulate - Progress Goal set today  Pt will Perform Home Exercise Program Independently  PT Goal: Perform Home Exercise Program - Progress Goal set today  Written Expression  Dominant Hand Right    Pain: 5/10 L LE  Lewis Shock, PT, DPT Pager #: [phone] Office #: [phone]

## 2011-11-08 NOTE — Progress Notes (Signed)
   CARE MANAGEMENT NOTE 11/08/2011  Patient:  Ashley Howard   Account Number:  [id number]  Date Initiated:  11/08/2011  Documentation initiated by:  Carlitos Bottino  Subjective/Objective Assessment:   CM received order for [facility] needs, however pt no progressing adequately and husband unable to assist.     Action/Plan:   CSW notified that pt may need short term SNF   Anticipated DC Date:  11/10/2011   Anticipated DC Plan:  SKILLED NURSING FACILITY         Choice offered to / List presented to:             Status of service:  Completed, signed off Medicare Important Message given?   (If response is "NO", the following Medicare IM given date fields will be blank) Date Medicare IM given:   Date Additional Medicare IM given:    Discharge Disposition:  SKILLED NURSING FACILITY  Per UR Regulation:  Reviewed for med. necessity/level of care/duration of stay  If discussed at Long Length of Stay Meetings, dates discussed:    Comments:

## 2011-11-08 NOTE — Progress Notes (Signed)
Patient ID: Ashley Howard, female   DOB: [birth year], 66 y.o.   MRN: [id number] Postoperative day 1 left foot first ray amputation. Patient states she feels uncomfortable with discharge to home. She will work with physical therapy with a Darco shoe on the left foot possibly discharge to home on Sunday.

## 2011-11-08 NOTE — Progress Notes (Signed)
Rept to Dr Lajoyce Corners regarding pt's BP 105/69 pulse 86, pt's failure to progress with PT and no help at home, +MSSA status. Orders received to hold lasix and aldactone today, MSW consult for SNF placement, mupirocin ointment intra nasally BID and CHG cloth wipes. Also repted to Dr. Lajoyce Corners regarding pt was found to have a tick attached to her skin on her L side this AM. Tick was completely removed and red area was circled. As per his order, will continue to monitor area.

## 2011-11-09 NOTE — Progress Notes (Signed)
Physical Therapy Treatment Patient Details Name: Ashley Howard MRN: [id number] DOB: 01-18-1946 Today's Date: 11/09/2011 Time: 8657-8469 PT Time Calculation (min): 30 min  PT Assessment / Plan / Recommendation Comments on Treatment Session  Pt easily distracted and gets off topic quickly.  Cues to stay on task and for safety. Pt desires SNF secondary to husband unable to assist at home.    Follow Up Recommendations  Skilled nursing facility;Supervision/Assistance - 24 hour    Equipment Recommendations  Defer to next venue    Frequency Min 5X/week   Plan Discharge plan remains appropriate    Precautions / Restrictions Precautions Precautions: Fall Restrictions Weight Bearing Restrictions: Yes LLE Weight Bearing: Touchdown weight bearing   Pertinent Vitals/Pain Pt c/o L foot pain with dependent position but did not rate.    Mobility  Bed Mobility Bed Mobility: Supine to Sit Supine to Sit: 4: Min assist;HOB flat;With rails Details for Bed Mobility Assistance: Increased time required and verbal cues Transfers Sit to Stand: 4: Min assist;With upper extremity assist;From bed Stand to Sit: 4: Min assist;With upper extremity assist;With armrests;To chair/3-in-1 Details for Transfer Assistance: Pt maintaining NWB Ambulation/Gait Ambulation/Gait Assistance: 4: Min assist Ambulation Distance (Feet): 30 Feet Assistive device: Rolling walker Ambulation/Gait Assistance Details: Min cues for walker safety.  Able to maintain NWB during ambulation. Gait Pattern: Step-to pattern Gait velocity: slow Stairs: No Wheelchair Mobility Wheelchair Mobility: No    Exercises     PT Goals Acute Rehab PT Goals Time For Goal Achievement: 11/22/11 Potential to Achieve Goals: Good PT Goal: Supine/Side to Sit - Progress: Progressing toward goal PT Goal: Sit to Stand - Progress: Progressing toward goal PT Goal: Stand to Sit - Progress: Progressing toward goal PT Goal: Ambulate - Progress:  Progressing toward goal  Visit Information  Last PT Received On: 11/09/11 Assistance Needed: +1    Subjective Data  Subjective: Pt extremely talkative and off task at times.   Cognition  Overall Cognitive Status: Appears within functional limits for tasks assessed/performed Arousal/Alertness: Awake/alert Orientation Level: Oriented X4 / Intact Behavior During Session: WFL for tasks performed Cognition - Other Comments: pt easily distracted    Balance  Balance Balance Assessed: No  End of Session PT - End of Session Equipment Utilized During Treatment: Gait belt Activity Tolerance: Patient tolerated treatment well Patient left: in chair;with call bell/phone within reach Nurse Communication: Mobility status    Newell Coral 11/09/2011, 12:46 PM  Newell Coral, PTA Acute Rehab [phone] (office)

## 2011-11-09 NOTE — Progress Notes (Signed)
Patient ID: Ashley Howard, female   DOB: [birth year], 66 y.o.   MRN: [id number] Postoperative day 2 amputation left foot first ray. Patient is slow with therapy. Her husband is unable to care for her at home to to his multiple medical problems. Patient will benefit from short-term skilled nursing treatment. I felt to was signed for short term skilled nursing discharge. Plan discharge Monday.

## 2011-11-10 MED ORDER — MAGNESIUM CITRATE PO SOLN
1.0000 | Freq: Once | ORAL | Status: AC
  Administered 2011-11-10: 1 via ORAL
  Filled 2011-11-10: qty 296

## 2011-11-10 NOTE — Progress Notes (Signed)
Subjective: 3 Days Post-Op Procedure(s) (LRB): AMPUTATION RAY (Left) Patient reports pain as moderate.    Objective: Vital signs in last 24 hours: Temp:  [98.7 F (37.1 C)-99.4 F (37.4 C)] 98.7 F (37.1 C) (04/28 0636) Pulse Rate:  [77-88] 77  (04/28 0636) Resp:  [18-20] 20  (04/28 0636) BP: (106-125)/(66-82) 125/82 mmHg (04/28 0636) SpO2:  [98 %-99 %] 99 % (04/28 0636)  Intake/Output from previous day:   Intake/Output this shift:    No results found for this basename: HGB:5 in the last 72 hours No results found for this basename: WBC:2,RBC:2,HCT:2,PLT:2 in the last 72 hours No results found for this basename: NA:2,K:2,CL:2,CO2:2,BUN:2,CREATININE:2,GLUCOSE:2,CALCIUM:2 in the last 72 hours No results found for this basename: LABPT:2,INR:2 in the last 72 hours  Neurologically intact  Assessment/Plan: 3 Days Post-Op Procedure(s) (LRB): AMPUTATION RAY (Left) Discharge to SNF  DUDA,MARCUS V 11/10/2011, 8:06 AM

## 2011-11-11 MED ORDER — WARFARIN SODIUM 1 MG PO TABS: 1.0000 mg | ORAL_TABLET | Freq: Every day | ORAL | Status: DC

## 2011-11-11 MED ORDER — HYDROCODONE-ACETAMINOPHEN 5-500 MG PO TABS: 1.0000 | ORAL_TABLET | Freq: Four times a day (QID) | ORAL | Status: AC | PRN

## 2011-11-11 MED ORDER — OXYCODONE-ACETAMINOPHEN 5-325 MG PO TABS: 1.0000 | ORAL_TABLET | ORAL | Status: AC | PRN

## 2011-11-11 NOTE — Discharge Summary (Signed)
Physician Discharge Summary  Patient ID: Ashley Howard MRN: [id number] DOB/AGE: 02/23/1946 66 y.o.  Admit date: 11/07/2011 Discharge date: 11/11/2011  Admission Diagnoses: Osteomyelitis abscess and ulceration left foot  Discharge Diagnoses: Same Active Problems:  * No active hospital problems. *    Discharged Condition: stable  Hospital Course: Patient's hospital course was essentially unremarkable she underwent a first ray amputation of the left foot. Postoperatively she progressed slowly with therapy and was felt to benefit from short-term skilled nursing placement.  Consults: None  Significant Diagnostic Studies: labs: Routine labs  Treatments: surgery: Please see operative note  Discharge Exam: Blood pressure 124/75, pulse 72, temperature 98.8 F (37.1 C), temperature source Oral, resp. rate 18, height 5\' 2"  (1.575 m), weight 48.535 kg (107 lb), SpO2 97.00%. Incision/Wound: incision clean and dry at time of discharge.  Disposition: Final discharge disposition not confirmed  Discharge Orders    Future Orders Please Complete By Expires   Diet - low sodium heart healthy      Call MD / Call 911      Comments:   If you experience chest pain or shortness of breath, CALL 911 and be transported to the hospital emergency room.  If you develope a fever above 101 F, pus (white drainage) or increased drainage or redness at the wound, or calf pain, call your surgeon's office.   Constipation Prevention      Comments:   Drink plenty of fluids.  Prune juice may be helpful.  You may use a stool softener, such as Colace (over the counter) 100 mg twice a day.  Use MiraLax (over the counter) for constipation as needed.   Increase activity slowly as tolerated      Weight Bearing as taught in Physical Therapy      Comments:   Use a walker or crutches as instructed.     Medication List  As of 11/11/2011  6:45 AM   TAKE these medications         bisacodyl 5 MG EC tablet   Commonly  known as: DULCOLAX   Take 5 mg by mouth once a week.      calcium carbonate 500 MG chewable tablet   Commonly known as: TUMS - dosed in mg elemental calcium   Chew 1 tablet by mouth 3 (three) times daily before meals.      doxycycline 100 MG tablet   Commonly known as: VIBRA-TABS   Take 100 mg by mouth 2 (two) times daily.      furosemide 20 MG tablet   Commonly known as: LASIX   Take 20 mg by mouth daily.      guaifenesin 100 MG/5ML syrup   Commonly known as: ROBITUSSIN   Take 200 mg by mouth daily as needed.      HYDROcodone-acetaminophen 5-500 MG per tablet   Commonly known as: VICODIN   Take 1 tablet by mouth every 6 (six) hours as needed for pain.      methocarbamol 500 MG tablet   Commonly known as: ROBAXIN   Take 500 mg by mouth 4 (four) times daily.      mulitivitamin with minerals Tabs   Take 1 tablet by mouth daily.      naproxen sodium 220 MG tablet   Commonly known as: ANAPROX   Take 440 mg by mouth 3 (three) times daily with meals.      oxyCODONE-acetaminophen 5-325 MG per tablet   Commonly known as: PERCOCET   Take 1 tablet by  mouth every 4 (four) hours as needed for pain.      pseudoephedrine-guaifenesin 60-600 MG per tablet   Commonly known as: MUCINEX D   Take 1 tablet by mouth every 12 (twelve) hours.      spironolactone 25 MG tablet   Commonly known as: ALDACTONE   Take 25 mg by mouth daily.      warfarin 1 MG tablet   Commonly known as: COUMADIN   Take 1 tablet (1 mg total) by mouth daily.           Follow-up Information    Follow up with Alekxander Isola V, MD in 3 weeks.   Contact information:   [address] [place] [address] [phone]          Signed: Nadara Mustard 11/11/2011, 6:45 AM

## 2011-11-11 NOTE — Discharge Instructions (Signed)
Physical therapy progressive ambulation nonweightbearing on the left lower extremity

## 2011-11-11 NOTE — Progress Notes (Signed)
Physical Therapy Treatment Note   11/11/11 0940  PT Visit Information  Last PT Received On 11/11/11  Assistance Needed +1  PT Time Calculation  PT Start Time 0940  PT Stop Time 0955  PT Time Calculation (min) 15 min  Subjective Data  Subjective Pt received sitting up in chair.  Precautions  Precautions Fall  Required Braces or Orthoses (post op shoe on L LE when amb)  Restrictions  LLE Weight Bearing TWB  Cognition  Overall Cognitive Status Appears within functional limits for tasks assessed/performed  Arousal/Alertness Awake/alert  Orientation Level Oriented X4 / Intact  Behavior During Session [facility] for tasks performed  Cognition - Other Comments pt easily distracted and requires freq re-directing to stay on task  Bed Mobility  Bed Mobility Not assessed (pt received sitting up in chair)  Transfers  Transfers Sit to Stand;Stand to Sit  Sit to Stand 4: Min guard;With armrests;With upper extremity assist;From chair/3-in-1  Stand to Sit 4: Min assist;With upper extremity assist;With armrests;To chair/3-in-1  Details for Transfer Assistance pt maintained L LE NWB  Ambulation/Gait  Ambulation/Gait Assistance 4: Min assist  Ambulation Distance (Feet) 60 Feet  Assistive device Rolling walker  Ambulation/Gait Assistance Details pt 100% compliant with L LE NWB.  Gait Pattern Step-to pattern  Gait velocity improved from last PT session  Stairs No  PT - End of Session  Equipment Utilized During Treatment Gait belt  Activity Tolerance Patient tolerated treatment well  Patient left in chair;with call bell/phone within reach  Nurse Communication Mobility status  PT - Assessment/Plan  Comments on Treatment Session patient con't to require assist for transfers, ADLs, and mobility. Patient con't to benefit from SNF placement due to inabiltiy for anyone to provide assistance to her at home. patient 100% compliant with L LE NWB.  PT Plan Discharge plan remains appropriate;Frequency needs to  be updated  PT Frequency Min 3X/week  Follow Up Recommendations Skilled nursing facility  Equipment Recommended Defer to next venue  Acute Rehab PT Goals  Time For Goal Achievement 11/22/11  Potential to Achieve Goals Good  PT Goal: Sit to Stand - Progress Progressing toward goal  PT Goal: Stand to Sit - Progress Progressing toward goal  PT Goal: Ambulate - Progress Progressing toward goal  PT Goal: Perform Home Exercise Program - Progress Progressing toward goal    Pain: pt denies pain at this time  Lewis Shock, PT, DPT Pager #: [phone] Office #: [phone]

## 2012-02-18 ENCOUNTER — Other Ambulatory Visit: Payer: Self-pay | Admitting: Neurology

## 2012-02-18 DIAGNOSIS — R32 Unspecified urinary incontinence: Secondary | ICD-10-CM

## 2012-02-18 DIAGNOSIS — R269 Unspecified abnormalities of gait and mobility: Secondary | ICD-10-CM

## 2012-02-20 ENCOUNTER — Other Ambulatory Visit: Payer: Self-pay | Admitting: Neurology

## 2012-02-20 DIAGNOSIS — R269 Unspecified abnormalities of gait and mobility: Secondary | ICD-10-CM

## 2012-02-20 DIAGNOSIS — R32 Unspecified urinary incontinence: Secondary | ICD-10-CM

## 2012-02-27 ENCOUNTER — Ambulatory Visit
Admission: RE | Admit: 2012-02-27 | Discharge: 2012-02-27 | Disposition: A | Discharge status: Home / Self Care | Payer: Medicare Other | Source: Ambulatory Visit | Attending: Neurology | Admitting: Neurology

## 2012-02-27 DIAGNOSIS — R269 Unspecified abnormalities of gait and mobility: Secondary | ICD-10-CM

## 2012-02-27 DIAGNOSIS — R32 Unspecified urinary incontinence: Secondary | ICD-10-CM

## 2012-05-12 ENCOUNTER — Other Ambulatory Visit: Payer: Self-pay | Admitting: Neurology

## 2012-05-12 DIAGNOSIS — R32 Unspecified urinary incontinence: Secondary | ICD-10-CM

## 2012-05-12 DIAGNOSIS — R269 Unspecified abnormalities of gait and mobility: Secondary | ICD-10-CM

## 2012-05-15 ENCOUNTER — Ambulatory Visit
Admission: RE | Admit: 2012-05-15 | Discharge: 2012-05-15 | Disposition: A | Discharge status: Home / Self Care | Payer: Medicare Other | Source: Ambulatory Visit | Attending: Neurology | Admitting: Neurology

## 2012-05-15 DIAGNOSIS — R269 Unspecified abnormalities of gait and mobility: Secondary | ICD-10-CM

## 2012-05-15 DIAGNOSIS — R32 Unspecified urinary incontinence: Secondary | ICD-10-CM

## 2012-05-27 ENCOUNTER — Other Ambulatory Visit: Payer: Self-pay | Admitting: Neurology

## 2012-05-27 DIAGNOSIS — E538 Deficiency of other specified B group vitamins: Secondary | ICD-10-CM

## 2012-05-27 DIAGNOSIS — R269 Unspecified abnormalities of gait and mobility: Secondary | ICD-10-CM

## 2012-05-27 DIAGNOSIS — R32 Unspecified urinary incontinence: Secondary | ICD-10-CM

## 2012-06-04 ENCOUNTER — Ambulatory Visit
Admission: RE | Admit: 2012-06-04 | Discharge: 2012-06-04 | Disposition: A | Discharge status: Home / Self Care | Payer: Medicare Other | Source: Ambulatory Visit | Attending: Neurology | Admitting: Neurology

## 2012-06-04 DIAGNOSIS — R32 Unspecified urinary incontinence: Secondary | ICD-10-CM

## 2012-06-04 DIAGNOSIS — R269 Unspecified abnormalities of gait and mobility: Secondary | ICD-10-CM

## 2012-06-04 DIAGNOSIS — E538 Deficiency of other specified B group vitamins: Secondary | ICD-10-CM

## 2012-06-04 MED ORDER — GADOBENATE DIMEGLUMINE 529 MG/ML IV SOLN
10.0000 mL | Freq: Once | INTRAVENOUS | Status: AC | PRN
  Administered 2012-06-04: 10 mL via INTRAVENOUS

## 2012-07-29 ENCOUNTER — Ambulatory Visit (INDEPENDENT_AMBULATORY_CARE_PROVIDER_SITE_OTHER): Payer: Self-pay | Admitting: Surgery

## 2012-08-12 ENCOUNTER — Encounter (INDEPENDENT_AMBULATORY_CARE_PROVIDER_SITE_OTHER): Payer: Self-pay | Admitting: Surgery

## 2012-08-12 ENCOUNTER — Ambulatory Visit (INDEPENDENT_AMBULATORY_CARE_PROVIDER_SITE_OTHER): Payer: Medicare Other | Admitting: Surgery

## 2012-08-12 ENCOUNTER — Telehealth (INDEPENDENT_AMBULATORY_CARE_PROVIDER_SITE_OTHER): Payer: Self-pay | Admitting: General Surgery

## 2012-08-12 NOTE — Telephone Encounter (Signed)
LMOM for patient to call back and ask for Pattricia Boss, I want to move pt up on Dr Magnus Ivan schedule for 1-29

## 2012-08-20 ENCOUNTER — Ambulatory Visit (INDEPENDENT_AMBULATORY_CARE_PROVIDER_SITE_OTHER): Payer: Medicare Other | Admitting: Surgery

## 2012-08-27 ENCOUNTER — Ambulatory Visit (INDEPENDENT_AMBULATORY_CARE_PROVIDER_SITE_OTHER): Payer: Medicare Other | Admitting: Surgery

## 2012-09-14 ENCOUNTER — Ambulatory Visit (INDEPENDENT_AMBULATORY_CARE_PROVIDER_SITE_OTHER): Payer: Medicare Other | Admitting: Surgery

## 2012-09-23 ENCOUNTER — Ambulatory Visit (INDEPENDENT_AMBULATORY_CARE_PROVIDER_SITE_OTHER): Payer: Medicare Other | Admitting: Surgery

## 2012-10-05 ENCOUNTER — Ambulatory Visit (INDEPENDENT_AMBULATORY_CARE_PROVIDER_SITE_OTHER): Payer: Medicare Other | Admitting: Surgery

## 2012-10-05 ENCOUNTER — Encounter (INDEPENDENT_AMBULATORY_CARE_PROVIDER_SITE_OTHER): Payer: Self-pay | Admitting: Surgery

## 2012-10-05 VITALS — BP 130/86 | HR 84 | Temp 98.4°F | Resp 22 | Ht 60.0 in | Wt 101.2 lb

## 2012-10-05 DIAGNOSIS — K8020 Calculus of gallbladder without cholecystitis without obstruction: Secondary | ICD-10-CM

## 2012-10-05 NOTE — Progress Notes (Signed)
Patient ID: Ashley Howard, female   DOB: [birth year], 67 y.o.   MRN: [id number]  Chief Complaint  Patient presents with  . Other    gallstones    HPI Ashley Howard is a 67 y.o. female.   HPI This is a very confused about her medical health 67 year old female. She was found to have gallstones and a CAT scan in January of 2013. She is a self-referral because of gallstones. She has a lot of chronic medical conditions.  She does not appear to have nausea, vomiting, or right upper quadrant abdominal pain after fatty meals. She apparently has seen a gastroenterologist who decided she was not a candidate for upper lower endoscopy from her sedation standpoint. Past Medical History  Diagnosis Date  . Incontinence of urine     wears adult briefs  . Gallstones   . Fluid retention in legs     wears compression stocking R leg  . PONV (postoperative nausea and vomiting)   . Seasonal allergies   . Weight loss, unintentional     40 lbs in last year  . Shortness of breath     with pain  . GERD (gastroesophageal reflux disease)   . H/O hiatal hernia   . Arthritis   . Scoliosis   . Bowel incontinence     Past Surgical History  Procedure Laterality Date  . Knee arthroscopy      left  . Dobutamine stress echo    . Breast surgery  1989    lumpectomy bilat  . Tonsillectomy    . Adenoidectomy    . Eye surgery      bilat cataracts  . Appendectomy    . Abdominal hysterectomy  1975  . Bladder repair    . Amputation  11/07/2011    Procedure: AMPUTATION RAY;  Surgeon: Nadara Mustard, MD;  Location: [facility] OR;  Service: Orthopedics;  Laterality: Left;  Left Foot 1st Ray Amputation    History reviewed. No pertinent family history.  Social History History  Substance Use Topics  . Smoking status: Current Every Day Smoker -- 1.00 packs/day  . Smokeless tobacco: Not on file  . Alcohol Use: Yes     Comment: occasional    Allergies  Allergen Reactions  . Tape Other (See Comments)    Paper tape  - blisters  . Tylenol (Acetaminophen) Other (See Comments)    Stop from urinating     Current Outpatient Prescriptions  Medication Sig Dispense Refill  . bisacodyl (DULCOLAX) 5 MG EC tablet Take 5 mg by mouth once a week.      . calcium carbonate (TUMS - DOSED IN MG ELEMENTAL CALCIUM) 500 MG chewable tablet Chew 1 tablet by mouth 3 (three) times daily before meals.      Marland Kitchen doxycycline (VIBRA-TABS) 100 MG tablet Take 100 mg by mouth 2 (two) times daily.      . furosemide (LASIX) 20 MG tablet Take 20 mg by mouth daily.      Marland Kitchen guaifenesin (ROBITUSSIN) 100 MG/5ML syrup Take 200 mg by mouth daily as needed.      . hydrocodone-ibuprofen (VICOPROFEN) 5-200 MG per tablet       . losartan-hydrochlorothiazide (HYZAAR) 50-12.5 MG per tablet       . methocarbamol (ROBAXIN) 500 MG tablet Take 500 mg by mouth 4 (four) times daily.      . Multiple Vitamin (MULITIVITAMIN WITH MINERALS) TABS Take 1 tablet by mouth daily.      . naproxen sodium (  ANAPROX) 220 MG tablet Take 440 mg by mouth 3 (three) times daily with meals.      . pravastatin (PRAVACHOL) 20 MG tablet       . pseudoephedrine-guaifenesin (MUCINEX D) 60-600 MG per tablet Take 1 tablet by mouth every 12 (twelve) hours.      Marland Kitchen spironolactone (ALDACTONE) 25 MG tablet Take 25 mg by mouth daily.      Marland Kitchen warfarin (COUMADIN) 1 MG tablet Take 1 tablet (1 mg total) by mouth daily.  30 tablet  0   No current facility-administered medications for this visit.    Review of Systems Review of Systems  Unable to perform ROS   Blood pressure 130/86, pulse 84, temperature 98.4 F (36.9 C), temperature source Temporal, resp. rate 22, height 5' (1.524 m), weight 101 lb 3.2 oz (45.904 kg).  Physical Exam Physical Exam  Constitutional:  Disheveled-looking cachectic female walking bent over with a cane but in no acute distress  Cardiovascular: Normal rate, regular rhythm, normal heart sounds and intact distal pulses.   No murmur heard. Pulmonary/Chest:  Effort normal. She has wheezes.  Abdominal: Soft. She exhibits no distension. There is no tenderness. There is no rebound and no guarding.    Data Reviewed I have reviewed her CAT scan showing cholelithiasis  Assessment    Cholelithiasis     Plan    I do not believe she is symptomatic at this point. I believe she needs a much more complete workup including upper and lower endoscopy. I am also uncertain of her cardiac status. She does not appear to be the greatest of surgical candidate. I encouraged her to followup with her primary care physician. I explained gallbladder disease with her in detail. I will see her back as needed        Zidane Renner A 10/05/2012, 4:26 PM

## 2012-11-23 ENCOUNTER — Telehealth: Payer: Self-pay | Admitting: Nurse Practitioner

## 2012-11-25 ENCOUNTER — Other Ambulatory Visit (HOSPITAL_COMMUNITY)
Admission: RE | Admit: 2012-11-25 | Discharge: 2012-11-25 | Disposition: A | Discharge status: Home / Self Care | Payer: Medicare Other | Source: Ambulatory Visit | Attending: Family Medicine | Admitting: Family Medicine

## 2012-11-25 ENCOUNTER — Other Ambulatory Visit: Payer: Self-pay | Admitting: Family Medicine

## 2012-11-25 DIAGNOSIS — Z01419 Encounter for gynecological examination (general) (routine) without abnormal findings: Secondary | ICD-10-CM | POA: Insufficient documentation

## 2012-11-25 DIAGNOSIS — Z1151 Encounter for screening for human papillomavirus (HPV): Secondary | ICD-10-CM | POA: Insufficient documentation

## 2012-12-04 ENCOUNTER — Telehealth: Payer: Self-pay | Admitting: Neurology

## 2012-12-04 NOTE — Telephone Encounter (Signed)
I spoke to patient who said she had a lot of labs and wanted to know what she was tested for.  I told her the best way to go over these labs was to keep her appointment on 12-25-12 with Eber Jones and they could go over and discuss the labs.  She agreed and said she would try and make appointment.  She has transportation issues.  She knows she is vitamin B12 deficient but has not been able to come in and get her injections on a regular bases.

## 2012-12-25 ENCOUNTER — Ambulatory Visit: Payer: Self-pay | Admitting: Nurse Practitioner

## 2012-12-30 ENCOUNTER — Telehealth: Payer: Self-pay | Admitting: *Deleted

## 2012-12-30 ENCOUNTER — Ambulatory Visit (INDEPENDENT_AMBULATORY_CARE_PROVIDER_SITE_OTHER): Payer: Medicare Other | Admitting: Neurology

## 2012-12-30 ENCOUNTER — Ambulatory Visit: Payer: Medicare Other | Admitting: Nurse Practitioner

## 2012-12-30 ENCOUNTER — Other Ambulatory Visit: Payer: Self-pay | Admitting: Neurology

## 2012-12-30 DIAGNOSIS — E538 Deficiency of other specified B group vitamins: Secondary | ICD-10-CM

## 2012-12-30 MED ORDER — CYANOCOBALAMIN 1000 MCG/ML IJ SOLN
1000.0000 ug | Freq: Once | INTRAMUSCULAR | Status: AC
  Administered 2012-12-30: 1000 ug via INTRAMUSCULAR

## 2012-12-30 NOTE — Telephone Encounter (Signed)
Message copied by Monico Blitz on Wed Dec 30, 2012  3:28 PM ------      Message from: Arther Abbott B      Created: Wed Dec 30, 2012 12:15 PM      Contact:  Pt [place] [phone] cell #       Pt had apt today was late had to put on waiting list due to she can't be seen until afternoon lunch, she is needing someone to call her about a pain management Dr. To be seen until then to get something for pain.  ------

## 2012-12-30 NOTE — Telephone Encounter (Signed)
Pt had apt today was late had to put on waiting list due to she can't be seen until afternoon lunch, she is needing someone to call her about a pain management Dr. To be seen until then to get something for pain.

## 2012-12-30 NOTE — Patient Instructions (Signed)
Patient was told to try and come once a month for B12 injections.  She has not been for a few months, has a problem with transportation.

## 2012-12-30 NOTE — Progress Notes (Signed)
Patient here for B12 injection.  Under aseptic technique Cyanocobalamin 1000mcg/1ml given IM in right gluteal.  Tolerated well.  Band-Aid applied. 

## 2013-01-01 ENCOUNTER — Encounter: Payer: Self-pay | Admitting: *Deleted

## 2013-01-01 NOTE — Telephone Encounter (Signed)
This encounter was created in error - please disregard.

## 2013-01-04 ENCOUNTER — Encounter: Payer: Self-pay | Admitting: Neurology

## 2013-01-04 ENCOUNTER — Ambulatory Visit: Payer: Self-pay

## 2013-01-04 ENCOUNTER — Ambulatory Visit (INDEPENDENT_AMBULATORY_CARE_PROVIDER_SITE_OTHER): Payer: Medicare Other | Admitting: Neurology

## 2013-01-04 VITALS — BP 119/80 | HR 78 | Ht 60.0 in | Wt 107.0 lb

## 2013-01-04 DIAGNOSIS — M79675 Pain in left toe(s): Secondary | ICD-10-CM | POA: Insufficient documentation

## 2013-01-04 DIAGNOSIS — R269 Unspecified abnormalities of gait and mobility: Secondary | ICD-10-CM | POA: Insufficient documentation

## 2013-01-04 DIAGNOSIS — E538 Deficiency of other specified B group vitamins: Secondary | ICD-10-CM

## 2013-01-04 DIAGNOSIS — M79609 Pain in unspecified limb: Secondary | ICD-10-CM

## 2013-01-04 MED ORDER — CYANOCOBALAMIN 1000 MCG/ML IJ SOLN
1000.0000 ug | Freq: Once | INTRAMUSCULAR | Status: AC
  Administered 2013-01-04: 1000 ug via INTRAMUSCULAR

## 2013-01-04 NOTE — Patient Instructions (Signed)
Reminder to follow up for next B12 injection.

## 2013-01-04 NOTE — Addendum Note (Signed)
Addended by: Levert Feinstein on: 01/04/2013 04:46 PM   Modules accepted: Orders

## 2013-01-04 NOTE — Progress Notes (Signed)
Patient here for B12 injection. Administered cyclocobalamin 1000 mcg/ml IM in L gluteus under aseptic technique. Bandaid applied.

## 2013-01-04 NOTE — Progress Notes (Signed)
History of Present Illness:   Ashley Howard is a 67 years old right-handed Caucasian female, referred by her primary care physician for evaluation of bowel and bladder incontinence.  She is a poor historian, reported a history of recent left foot toes amputation in April 2013, still has left foot pain,because of chronic nonhealing left lower extremity swelling, toe infection,  She has a midline chronic low back pain, reported 2 years history of urinary incontinence, getting worse, she urinate on herself sometimes when she gets up in the morning, recently also developed bowel incontinence, she could not make it in the bathroom,  She denies significant bilateral lower extremity paresthesia, but complains recent bilateral fingertips and numbness, no upper extremity weakness, she has gait difficulty due to left foot pain, there was no significant left lower extremity weakness  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   I have reviewed MRI film together with patient, brain parenchyma shows  multiple subcortical, periventricular, periatrial white matter hyperintensities on T2/flair images, MRI thoracic spine was normal, there was no intrinsic cord lesion.  Laboratory showed low B12, with significantly elevated homocystine, methylmalonic level, she was started on IM B12 supplement, there was no significant improvement, she continued to have frequent low back pain, muscle spasm, gait difficulty, left foot pain,  intermittent bowel and bladder incontinence.  Visual evoked potential was normal  UPDATE June 23rd 2014: She denies significant change of her status, continue had mild left foot pain, gait difficulty, bowel bladder incontinence, she also reported a previous history of traumatic hysterectomy, with complications, but she could not elaborate on details  Review of Systems  Out of a complete 14 system review, the patient complains of only the following symptoms, and all other reviewed systems are negative.    She complains of fever, chills, swellings in legs, hearing loss, moles, blurred vision, incontinence, feeling hot, feeling cold, joint sweating, achy muscles, allergy, skin sensitivity, snoring, depression, anxiety, not enough sleep,   Physical Exam  Neck: supple no carotid bruits Respiratory: clear to auscultation bilaterally Cardiovascular: regular rate rhythm Musculoskeletal: scoliosis  Neurologic Exam  Mental Status: awake, alert, cooperative to history, talking, and casual conversation. Cranial Nerves: CN II-XII pupils were equal round reactive to light.  Fundi were sharp bilaterally.  Extraocular movements were full.  Visual fields were full on confrontational test.  Facial sensation and strength were normal.  Hearing was intact to finger rubbing bilaterally.  Uvula tongue were midline.  Head turning and shoulder shrugging were normal and symmetric.  Tongue protrusion into the cheeks strength were normal.  Motor: Normal tone, bulk, and strength, s/p left 1 toe amputation, mild left ankle swelling, tenderness upon palpitation Sensory: milldy dependent decreased  light touch, pinprick,  and preserved toe vibratory sensation and proprioception. Coordination: Normal finger-to-nose, heel-to-shin.  There was no dysmetria noticed. Gait and Station: need to push up, atalgic, dragging left foot across the floor, scoliosis, unsteady.  Reflexes: Deep tendon reflexes: Biceps: 2+/2+,  Brachioradialis: 2/2, Triceps: 2/2, Pateller: 2/2, Achilles: absent  Plantar responses are flexor.   Assessment and Plan:  66 years  old Caucasian female reported more than 2 years history of urinary urgency incontinence, recent 1 year history of bowel incontinence, on examination, she is status post  left toe amputation, brisk upper extremity and  patella reflexes, absent ankle reflex, MRI cervical, lumbar showed DJD, no significant canal stenosis. abnormal MRI brain consistent with small vessel disease, would not explain her bowel and bladder incontinence. she reported a history of traumatic hysterectomy with complications in the past, but could not elaborate on details,  1. I will refer her to urology for further evaluation 2. Return to clinic in 6-9 months

## 2013-01-05 ENCOUNTER — Ambulatory Visit: Payer: Self-pay

## 2013-05-24 ENCOUNTER — Other Ambulatory Visit: Payer: Self-pay | Admitting: Neurology

## 2013-05-31 NOTE — Telephone Encounter (Signed)
Dr. Terrace Arabia,  This patient is coming to see CM Daphine Deutscher, NP on July 06 2013. Should we send new Rx to cover folate through December?

## 2013-06-13 ENCOUNTER — Other Ambulatory Visit: Payer: Self-pay

## 2013-06-13 MED ORDER — FOLIC ACID 1 MG PO TABS: 2.0000 mg | ORAL_TABLET | Freq: Every day | ORAL | Status: DC

## 2013-07-06 ENCOUNTER — Ambulatory Visit: Payer: Medicare Other | Admitting: Nurse Practitioner

## 2013-07-06 ENCOUNTER — Telehealth: Payer: Self-pay | Admitting: Nurse Practitioner

## 2013-07-06 NOTE — Telephone Encounter (Signed)
No showed for appt

## 2013-07-28 ENCOUNTER — Other Ambulatory Visit: Payer: Self-pay | Admitting: Neurology

## 2013-07-29 NOTE — Telephone Encounter (Signed)
No showed last OV, no showed last injection appt as well.

## 2013-08-05 ENCOUNTER — Other Ambulatory Visit: Payer: Self-pay | Admitting: Neurology

## 2013-08-06 NOTE — Telephone Encounter (Signed)
Patient has rescheduled appt  

## 2013-09-02 NOTE — Telephone Encounter (Signed)
Pt came in for her visit closing encounter °

## 2013-11-19 ENCOUNTER — Ambulatory Visit: Payer: Medicare Other | Admitting: Nurse Practitioner

## 2014-02-03 ENCOUNTER — Other Ambulatory Visit: Payer: Self-pay | Admitting: Neurology

## 2014-02-03 NOTE — Telephone Encounter (Signed)
Patient has an appt scheduled in Sept

## 2014-02-06 ENCOUNTER — Other Ambulatory Visit: Payer: Self-pay | Admitting: Neurology

## 2014-02-07 ENCOUNTER — Other Ambulatory Visit: Payer: Self-pay | Admitting: Neurology

## 2014-02-07 NOTE — Telephone Encounter (Signed)
Patient has appt scheduled in Sept

## 2014-03-15 ENCOUNTER — Encounter: Payer: Self-pay | Admitting: Nurse Practitioner

## 2014-03-15 ENCOUNTER — Ambulatory Visit (INDEPENDENT_AMBULATORY_CARE_PROVIDER_SITE_OTHER): Payer: Medicare Other | Admitting: Nurse Practitioner

## 2014-03-15 VITALS — BP 137/80 | HR 91 | Ht 60.0 in | Wt 129.0 lb

## 2014-03-15 DIAGNOSIS — R269 Unspecified abnormalities of gait and mobility: Secondary | ICD-10-CM

## 2014-03-15 NOTE — Progress Notes (Signed)
[facility]  PATIENT: Ashley Howard DOB: [birth year]   REASON FOR VISIT: follow up for bowel and bladder incontinence   HISTORY OF PRESENT ILLNESS: Ashley Howard, 68 year old female returns for followup. She was last seen by Dr. Krista Blue 6/ 23/ 2014. She has had some testing at urology and has been placed on Myrbetriq which she claims is very beneficial. She is supposed to have a colonoscopy done but has not followed through with obtaining an appt. She claims she was evaluated for colonoscopy but never followed up. She has a history of left foot toe amputation with gait abnormality. She is ambulating unassisted. She also has a history of multilevel degenerative disc disease.She sees Dr. Sharol Given. She returns for reevaluation  HISTORY: Ashley Howard is a 68 years old right-handed Caucasian female, referred by her primary care physician for evaluation of bowel and bladder incontinence.  She is a poor historian, reported a history of recent left foot toes amputation in April 2013, still has left foot pain,because of chronic nonhealing left lower extremity swelling, toe infection,  She has a midline chronic low back pain, reported 2 years history of urinary incontinence, getting worse, she urinate on herself sometimes when she gets up in the morning, recently also developed bowel incontinence, she could not make it in the bathroom,  She denies significant bilateral lower extremity paresthesia, but complains recent bilateral fingertips and numbness, no upper extremity weakness, she has gait difficulty due to left foot pain, there was no significant left lower extremity weakness  She is the caregiver of her husband, she has intermittent bilateral hands paresthesia no weakness she has a long-standing history of scoliosis, chronic low back pain  MRI of cervical and lumbar spine showed multilevel degenerative disc disease, but there was no significant canal stenosis, EMG nerve conduction study suggestive  of bilateral lumbosacral radiculopathy, also mild axonal peripheral neuropathy, bilateral carpal tunnel syndromes  MRI scan of the thoracic spine shows minor disc signal abnormalities without significant compression.  I have reviewed MRI film together with patient, brain parenchyma shows multiple subcortical, periventricular, periatrial white matter hyperintensities on T2/flair images, MRI thoracic spine was normal, there was no intrinsic cord lesion.  Laboratory showed low B12, with significantly elevated homocystine, methylmalonic level, she was started on IM B12 supplement, there was no significant improvement, she continued to have frequent low back pain, muscle spasm, gait difficulty, left foot pain, intermittent bowel and bladder incontinence.  Visual evoked potential was normal  UPDATE June 23rd 2014:  She denies significant change of her status, continue had mild left foot pain, gait difficulty, bowel bladder incontinence, she also reported a previous history of traumatic hysterectomy, with complications, but she could not elaborate on details  Review of Systems  Out of a complete 14 system review, the patient complains of only the following symptoms, and all other reviewed systems are negative.  She complains of fever, chills, swellings in legs, hearing loss, moles, blurred vision, incontinence, feeling hot, feeling cold, joint sweating, achy muscles, allergy, skin sensitivity, snoring, depression, anxiety, not enough sleep,    REVIEW OF SYSTEMS: Full 14 system review of systems performed and notable only for those listed, all others are neg:  Constitutional: N/A  Cardiovascular: N/A  Ear/Nose/Throat: N/A  Skin: N/A  Eyes: Itching Respiratory: N/A  Gastroitestinal: Incontinence of bowel, urinary frequency Hematology/Lymphatic: N/A  Endocrine: N/A Musculoskeletal: Joint pain, back pain Allergy/Immunology: N/A  Neurological: Numbness Psychiatric: N/A Sleep :  NA   ALLERGIES: Allergies  Allergen Reactions  .  Tape Other (See Comments)    Paper tape - blisters  . Tylenol [Acetaminophen] Other (See Comments)    Stop from urinating     HOME MEDICATIONS: Outpatient Prescriptions Prior to Visit  Medication Sig Dispense Refill  . bisacodyl (DULCOLAX) 5 MG EC tablet Take 5 mg by mouth once a week.      . busPIRone (BUSPAR) 30 MG tablet TAKE ONE TABLET BY MOUTH ONCE DAILY  90 tablet  0  . calcium carbonate (TUMS - DOSED IN MG ELEMENTAL CALCIUM) 500 MG chewable tablet Chew 1 tablet by mouth 3 (three) times daily before meals.      . Cholecalciferol (VITAMIN D PO) Take by mouth as directed.      . Cyanocobalamin (B-12 SL) Place under the tongue.      . folic acid (FOLVITE) 1 MG tablet TAKE TWO TABLETS BY MOUTH ONCE DAILY  180 tablet  1  . guaifenesin (ROBITUSSIN) 100 MG/5ML syrup Take 200 mg by mouth daily as needed.      Marland Kitchen losartan-hydrochlorothiazide (HYZAAR) 50-12.5 MG per tablet       . MAGNESIUM ASPARTATE PO Take by mouth as directed.      . methocarbamol (ROBAXIN) 500 MG tablet Take 500 mg by mouth 4 (four) times daily.      . Multiple Vitamin (MULITIVITAMIN WITH MINERALS) TABS Take 1 tablet by mouth daily.      . naproxen sodium (ANAPROX) 220 MG tablet Take 440 mg by mouth 3 (three) times daily with meals.      . pravastatin (PRAVACHOL) 20 MG tablet       . pseudoephedrine-guaifenesin (MUCINEX D) 60-600 MG per tablet Take 1 tablet by mouth every 12 (twelve) hours.      . triamcinolone cream (KENALOG) 0.1 % 0.1 application as directed.      . busPIRone (BUSPAR) 30 MG tablet TAKE ONE TABLET BY MOUTH ONCE DAILY  90 tablet  0   No facility-administered medications prior to visit.    PAST MEDICAL HISTORY: Past Medical History  Diagnosis Date  . Incontinence of urine     wears adult briefs  . Gallstones   . Fluid retention in legs     wears compression stocking R leg  . PONV (postoperative nausea and vomiting)   . Seasonal allergies   .  Weight loss, unintentional     40 lbs in last year  . Shortness of breath     with pain  . GERD (gastroesophageal reflux disease)   . H/O hiatal hernia   . Arthritis   . Scoliosis   . Bowel incontinence     PAST SURGICAL HISTORY: Past Surgical History  Procedure Laterality Date  . Knee arthroscopy      left  . Dobutamine stress echo    . Breast surgery  1989    lumpectomy bilat  . Tonsillectomy    . Adenoidectomy    . Eye surgery      bilat cataracts  . Appendectomy    . Abdominal hysterectomy  1975  . Bladder repair    . Amputation  11/07/2011    Procedure: AMPUTATION RAY;  Surgeon: Newt Minion, MD;  Location: [facility];  Service: Orthopedics;  Laterality: Left;  Left Foot 1st Ray Amputation    FAMILY HISTORY: History reviewed. No pertinent family history.  SOCIAL HISTORY: History   Social History  . Marital Status: Married    Spouse Name: Nadara Mustard    Number of Children: 1  . Years  of Education: N/A   Occupational History  . retired    Social History Main Topics  . Smoking status: Current Every Day Smoker -- 1.00 packs/day    Types: Cigarettes  . Smokeless tobacco: Never Used  . Alcohol Use: No     Comment: occasional  . Drug Use: No  . Sexual Activity: Not on file   Other Topics Concern  . Not on file   Social History Narrative   Patient is retired and lives at home with her husband Nadara Mustard.    Right handed.   Caffeine- coffee daily.     PHYSICAL EXAM  Filed Vitals:   03/15/14 1521  BP: 137/80  Pulse: 91  Height: 5' (1.524 m)  Weight: 129 lb (58.514 kg)   Body mass index is 25.19 kg/(m^2). Neck: supple no carotid bruits  Respiratory: clear to auscultation bilaterally  Cardiovascular: regular rate rhythm  Musculoskeletal: scoliosis  Neurologic Exam  Mental Status: awake, alert, cooperative to history, talking, and casual conversation.  Cranial Nerves: CN II-XII pupils were equal round reactive to light. Fundi were sharp bilaterally.  Extraocular movements were full. Visual fields were full on confrontational test. Facial sensation and strength were normal. Hearing was intact to finger rubbing bilaterally. Uvula tongue were midline. Head turning and shoulder shrugging were normal and symmetric. Tongue protrusion into the cheeks strength were normal.  Motor: Normal tone, bulk, and strength, s/p left 1 toe amputation, mild left ankle swelling, no tenderness upon palpitation  Sensory: milldy dependent decreased light touch, pinprick, and preserved toe vibratory sensation and proprioception.  Coordination: Normal finger-to-nose, heel-to-shin. There was no dysmetria noticed.  Gait and Station: need to push up, atalgic,  unsteady gait . No assistive device Reflexes: Deep tendon reflexes: Biceps: 2+/2+, Brachioradialis: 2/2, Triceps: 2/2, Pateller: 2/2, Achilles: absent Plantar responses are flexor.   DIAGNOSTIC DATA (LABS, IMAGING, TESTING) -ASSESSMENT AND PLAN  68 y.o. year old female  has a past medical history of Incontinence of urine;  Weight loss, unintentional; Shortness of breath; GERD (gastroesophageal reflux disease); H/O hiatal hernia; Arthritis; Scoliosis; and Bowel incontinence. here to followup. Her urinary incontinence is much better with Myrbetriq.  Continue F/U with urology for bladder incontinance GI groups in town that perform colonscopy are [place] and [place] F/U with [place] prn only [facility], [facility], [facility], [facility] [address], [place], [place] [address] [phone]

## 2014-03-15 NOTE — Patient Instructions (Signed)
Continue F/U with urology for bladder incontinance GI groups in town that perform colonscopy are [place] and [place] F/U with [place] prn only

## 2014-04-01 ENCOUNTER — Other Ambulatory Visit: Payer: Self-pay | Admitting: Neurology

## 2014-06-01 ENCOUNTER — Encounter: Payer: Self-pay | Admitting: Neurology

## 2014-06-07 ENCOUNTER — Encounter: Payer: Self-pay | Admitting: Neurology

## 2014-06-22 ENCOUNTER — Telehealth: Payer: Self-pay | Admitting: Neurology

## 2014-06-22 NOTE — Telephone Encounter (Signed)
Called patient and left her a message stating that I will forward message to Dr.Yan to ask about flu shots.

## 2014-06-22 NOTE — Telephone Encounter (Signed)
Patient is calling because she went to her PCP to get a flu shot and on the paper explaining the flu shot it stated if you have MS not to get the flu shot. Is this correct? Is it all right to have a pneumonia shot? Please call and advise. It is ok to leave a message. Thank you.

## 2014-06-22 NOTE — Telephone Encounter (Signed)
I have called her, she had abnormal MRI brain in the past, most likely small vessel disease, MS is on the differential list.   She is Ok to have flu shot, pneumonia vaccination  Hinton Dyer: Please give her a follow-up visit with me  in March 2016,

## 2014-06-23 NOTE — Telephone Encounter (Signed)
Called and spoke to patient she is scheduled for 2016. Patient is aware of date and time.

## 2014-07-19 DIAGNOSIS — N362 Urethral caruncle: Secondary | ICD-10-CM | POA: Diagnosis not present

## 2014-07-19 DIAGNOSIS — N3281 Overactive bladder: Secondary | ICD-10-CM | POA: Diagnosis not present

## 2014-07-19 DIAGNOSIS — R300 Dysuria: Secondary | ICD-10-CM | POA: Diagnosis not present

## 2014-10-06 ENCOUNTER — Ambulatory Visit: Payer: Self-pay | Admitting: Neurology

## 2014-11-08 DIAGNOSIS — I10 Essential (primary) hypertension: Secondary | ICD-10-CM | POA: Diagnosis not present

## 2014-11-08 DIAGNOSIS — N390 Urinary tract infection, site not specified: Secondary | ICD-10-CM | POA: Diagnosis not present

## 2014-11-08 DIAGNOSIS — M419 Scoliosis, unspecified: Secondary | ICD-10-CM | POA: Diagnosis not present

## 2014-11-08 DIAGNOSIS — N39498 Other specified urinary incontinence: Secondary | ICD-10-CM | POA: Diagnosis not present

## 2014-11-21 DIAGNOSIS — H43813 Vitreous degeneration, bilateral: Secondary | ICD-10-CM | POA: Diagnosis not present

## 2015-05-02 DIAGNOSIS — D510 Vitamin B12 deficiency anemia due to intrinsic factor deficiency: Secondary | ICD-10-CM | POA: Diagnosis not present

## 2015-05-02 DIAGNOSIS — Z6823 Body mass index (BMI) 23.0-23.9, adult: Secondary | ICD-10-CM | POA: Diagnosis not present

## 2015-05-02 DIAGNOSIS — Z23 Encounter for immunization: Secondary | ICD-10-CM | POA: Diagnosis not present

## 2015-05-02 DIAGNOSIS — R531 Weakness: Secondary | ICD-10-CM | POA: Diagnosis not present

## 2015-09-04 ENCOUNTER — Telehealth: Payer: Self-pay | Admitting: Neurology

## 2015-09-04 NOTE — Telephone Encounter (Signed)
Pt

## 2015-09-11 DIAGNOSIS — M545 Low back pain: Secondary | ICD-10-CM | POA: Diagnosis not present

## 2015-09-19 DIAGNOSIS — E785 Hyperlipidemia, unspecified: Secondary | ICD-10-CM | POA: Diagnosis not present

## 2015-09-19 DIAGNOSIS — Z0000 Encounter for general adult medical examination without abnormal findings: Secondary | ICD-10-CM | POA: Diagnosis not present

## 2015-09-19 DIAGNOSIS — I10 Essential (primary) hypertension: Secondary | ICD-10-CM | POA: Diagnosis not present

## 2015-10-16 DIAGNOSIS — N3281 Overactive bladder: Secondary | ICD-10-CM | POA: Diagnosis not present

## 2015-10-16 DIAGNOSIS — Z0000 Encounter for general adult medical examination without abnormal findings: Secondary | ICD-10-CM | POA: Diagnosis not present

## 2015-10-16 DIAGNOSIS — R32 Unspecified urinary incontinence: Secondary | ICD-10-CM | POA: Diagnosis not present

## 2016-01-10 ENCOUNTER — Encounter: Payer: Self-pay | Admitting: Gastroenterology

## 2016-03-14 ENCOUNTER — Encounter: Payer: Self-pay | Admitting: Gastroenterology

## 2016-03-14 ENCOUNTER — Ambulatory Visit (INDEPENDENT_AMBULATORY_CARE_PROVIDER_SITE_OTHER): Payer: Medicare Other | Admitting: Gastroenterology

## 2016-03-14 ENCOUNTER — Encounter (INDEPENDENT_AMBULATORY_CARE_PROVIDER_SITE_OTHER): Payer: Self-pay

## 2016-03-14 VITALS — BP 108/60 | HR 68 | Ht 60.0 in | Wt 106.2 lb

## 2016-03-14 DIAGNOSIS — K429 Umbilical hernia without obstruction or gangrene: Secondary | ICD-10-CM | POA: Diagnosis not present

## 2016-03-14 DIAGNOSIS — R159 Full incontinence of feces: Secondary | ICD-10-CM | POA: Diagnosis not present

## 2016-03-14 NOTE — Progress Notes (Addendum)
[facility] Consult Note:  History: Ashley Howard 03/14/2016  Referring physician: Maggie Font, MD  Reason for consult/chief complaint: Abdominal Pain (concerned about hernia); Diarrhea (incontinent at times); and Weight Loss (unsure how much she has lost) (Nearly 30 minutes late for today's clinic appointment)  Subjective  HPI:  This is a 70 year old woman referred by primary care for a constellation of symptoms. She is a very poor and tangential historian. Despite considerable effort, I was unable to get a consistent history from her. She seemed to be concerned about a possible hernia around the umbilicus, especially prominent when she bends over or lift something. She is sometimes incontinent of formed stool. She may have lost some weight but is uncertain how much it could be. We have no primary care notes for review. She denies rectal bleeding nausea vomiting or dysphagia. She cannot recall the last time she may have had a colonoscopy performed. She has a variety of other somatic symptoms that are very difficult to follow.  Poor historian ROS:  Review of Systems  Constitutional: Positive for fatigue. Negative for appetite change and unexpected weight change.  HENT: Negative for mouth sores and voice change.   Eyes: Negative for pain and redness.  Respiratory: Negative for cough and shortness of breath.   Cardiovascular: Positive for palpitations. Negative for chest pain.  Endocrine: Positive for cold intolerance.  Genitourinary: Positive for frequency and urgency. Negative for dysuria and hematuria.  Musculoskeletal: Positive for arthralgias and back pain. Negative for myalgias.  Skin: Negative for pallor and rash.  Neurological: Positive for light-headedness. Negative for weakness and headaches.  Hematological: Negative for adenopathy. Bruises/bleeds easily.     Past Medical History: Past Medical History:  Diagnosis Date  . Arthritis   . Bowel incontinence    . Fluid retention in legs    wears compression stocking R leg  . Gallstones   . GERD (gastroesophageal reflux disease)   . H/O hiatal hernia   . Incontinence of urine    wears adult briefs  . PONV (postoperative nausea and vomiting)   . Scoliosis   . Seasonal allergies   . Shortness of breath    with pain  . Weight loss, unintentional    40 lbs in last year     Past Surgical History: Past Surgical History:  Procedure Laterality Date  . ABDOMINAL HYSTERECTOMY  1975  . adenoidectomy    . AMPUTATION  11/07/2011   Procedure: AMPUTATION RAY;  Surgeon: Newt Minion, MD;  Location: [facility];  Service: Orthopedics;  Laterality: Left;  Left Foot 1st Ray Amputation  . APPENDECTOMY    . BLADDER REPAIR    . BREAST SURGERY  1989   lumpectomy bilat  . DOBUTAMINE STRESS ECHO    . EYE SURGERY     bilat cataracts  . KNEE ARTHROSCOPY     left  . TONSILLECTOMY       Family History: History reviewed. No pertinent family history. No known  family history of colorectal cancer   Social History: Social History   Social History  . Marital status: Married    Spouse name: Nadara Mustard  . Number of children: 1  . Years of education: N/A   Occupational History  . retired    Social History Main Topics  . Smoking status: Current Every Day Smoker    Packs/day: 1.00    Types: Cigarettes  . Smokeless tobacco: Never Used  . Alcohol use No     Comment: occasional  .  Drug use: No  . Sexual activity: Not Asked   Other Topics Concern  . None   Social History Narrative   Patient is retired and lives at home with her husband Nadara Mustard.    Right handed.   Caffeine- coffee daily.    Allergies: Allergies  Allergen Reactions  . Tape Other (See Comments)    Paper tape - blisters  . Tylenol [Acetaminophen] Other (See Comments)    Stop from urinating     Outpatient Meds: Current Outpatient Prescriptions  Medication Sig Dispense Refill  . bisacodyl (DULCOLAX) 5 MG EC tablet Take 5 mg by  mouth daily.     Marland Kitchen ENSURE PLUS (ENSURE PLUS) LIQD Take 237 mLs by mouth daily.    Marland Kitchen gabapentin (NEURONTIN) 300 MG capsule 300 mg 3 (three) times daily.    Marland Kitchen losartan-hydrochlorothiazide (HYZAAR) 50-12.5 MG per tablet     . methocarbamol (ROBAXIN) 500 MG tablet Take 500 mg by mouth 4 (four) times daily.    Marland Kitchen MYRBETRIQ 25 MG TB24 tablet 2 (two) times daily.     . naproxen sodium (ANAPROX) 220 MG tablet Take 440 mg by mouth 3 (three) times daily with meals.    . pravastatin (PRAVACHOL) 20 MG tablet     . pseudoephedrine-guaifenesin (MUCINEX D) 60-600 MG per tablet Take 1 tablet by mouth every 12 (twelve) hours.    . triamcinolone cream (KENALOG) 0.1 % 0.1 application as needed.      No current facility-administered medications for this visit.       ___________________________________________________________________ Objective   Exam:  BP 108/60   Pulse 68   Ht 5' (1.524 m)   Wt 106 lb 4 oz (48.2 kg)   BMI 20.75 kg/m    General: this is a(n) [place] elderly woman who is stooped over from scoliosis, walks with the aid of a cane.   Eyes: sclera anicteric, no redness  ENT: oral mucosa moist without lesions, no cervical or supraclavicular lymphadenopathy, good dentition  CV: RRR without murmur, S1/S2, no JVD, no peripheral edema  Resp: clear to auscultation bilaterally, normal RR and effort noted  GI: soft, no tenderness, with active bowel sounds. No guarding or palpable organomegaly noted. She seems to have a small easily reducible umbilical hernia when she stands up.  Skin; warm and dry, no rash or jaundice noted  Neuro: awake, alert and oriented x 3. Normal gross motor function and fluent speech She came incontinent of formed stool during the encounter. With the assistance of our MA, I performed a rectal exam which revealed poor resting and voluntary sphincter tone, no other palpable lesions and a small prolapsed internal hemorrhoid. We then got her cleaned changed with a fresh  diaper that she had brought from home.  Data: No labs or imaging for review  Assessment: Encounter Diagnoses  Name Primary?  . Incontinence of bowel Yes  . Umbilical hernia without obstruction and without gangrene     I'm afraid I really could not make sense of her reported symptoms, it is not clear how much weight she is really lost and whether it is really of a GI nature. She has lately living at home alone because her husband is in rehabilitation after her stroke. I think the risk-benefit ratio does not favor an endoscopic workup at this point, and I think she would not even be able to navigate the bowel preparation and logistics of the procedures.   Plan:  I advised a toilet regimen of 3-4 times a day  get more regular evacuation and decrease episodes of incontinence due to poor anorectal incision and muscle tone. If more specific data is available that would indicate the likelihood of a digestive disorder, this can be revisited.  Total 45 minute time, over half spent counseling and coordinating care.  Thank you for the courtesy of this consult.  Please call me with any questions or concerns.  Nelida Meuse III  CC: Maggie Font, MD

## 2016-03-14 NOTE — Patient Instructions (Signed)
If you are age 72 or older, your body mass index should be between 23-30. Your Body mass index is 20.75 kg/m. If this is out of the aforementioned range listed, please consider follow up with your Primary Care Provider.  If you are age 82 or younger, your body mass index should be between 19-25. Your Body mass index is 20.75 kg/m. If this is out of the aformentioned range listed, please consider follow up with your Primary Care Provider.   Thank you for choosing [facility]  Dr Wilfrid Lund III

## 2016-03-23 ENCOUNTER — Other Ambulatory Visit: Payer: Self-pay | Admitting: Neurology

## 2016-04-05 ENCOUNTER — Emergency Department (HOSPITAL_COMMUNITY): Payer: Medicare Other

## 2016-04-05 ENCOUNTER — Encounter (HOSPITAL_COMMUNITY): Payer: Self-pay | Admitting: *Deleted

## 2016-04-05 ENCOUNTER — Emergency Department (HOSPITAL_COMMUNITY)
Admission: EM | Admit: 2016-04-05 | Discharge: 2016-04-05 | Disposition: A | Discharge status: Home / Self Care | Payer: Medicare Other | Attending: Emergency Medicine | Admitting: Emergency Medicine

## 2016-04-05 DIAGNOSIS — M7989 Other specified soft tissue disorders: Secondary | ICD-10-CM | POA: Diagnosis not present

## 2016-04-05 DIAGNOSIS — Z791 Long term (current) use of non-steroidal anti-inflammatories (NSAID): Secondary | ICD-10-CM | POA: Insufficient documentation

## 2016-04-05 DIAGNOSIS — Z79899 Other long term (current) drug therapy: Secondary | ICD-10-CM | POA: Insufficient documentation

## 2016-04-05 DIAGNOSIS — Y929 Unspecified place or not applicable: Secondary | ICD-10-CM | POA: Insufficient documentation

## 2016-04-05 DIAGNOSIS — F17210 Nicotine dependence, cigarettes, uncomplicated: Secondary | ICD-10-CM | POA: Insufficient documentation

## 2016-04-05 DIAGNOSIS — S5012XA Contusion of left forearm, initial encounter: Secondary | ICD-10-CM | POA: Insufficient documentation

## 2016-04-05 DIAGNOSIS — Y9389 Activity, other specified: Secondary | ICD-10-CM | POA: Insufficient documentation

## 2016-04-05 DIAGNOSIS — S59912A Unspecified injury of left forearm, initial encounter: Secondary | ICD-10-CM | POA: Diagnosis not present

## 2016-04-05 DIAGNOSIS — Y999 Unspecified external cause status: Secondary | ICD-10-CM | POA: Diagnosis not present
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
## 2016-04-05 NOTE — Discharge Instructions (Signed)
Apply ice and heat to the area and use Tylenol and Motrin for pain.  Follow-up with your primary care doctor.  Return here as needed

## 2016-04-05 NOTE — ED Notes (Signed)
First attempt---PT dit not answer to name
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
## 2016-05-07 ENCOUNTER — Encounter (HOSPITAL_COMMUNITY): Payer: Self-pay | Admitting: Family Medicine

## 2016-05-07 ENCOUNTER — Ambulatory Visit (HOSPITAL_COMMUNITY)
Admission: EM | Admit: 2016-05-07 | Discharge: 2016-05-07 | Disposition: A | Discharge status: Home / Self Care | Payer: Medicare Other | Attending: Family Medicine | Admitting: Family Medicine

## 2016-05-07 DIAGNOSIS — J069 Acute upper respiratory infection, unspecified: Secondary | ICD-10-CM | POA: Diagnosis not present

## 2016-05-07 MED ORDER — IPRATROPIUM BROMIDE 0.06 % NA SOLN: 2.0000 | Freq: Four times a day (QID) | NASAL | 1 refills | Status: DC

## 2016-05-07 NOTE — Discharge Instructions (Signed)
Drink plenty of fluids as discussed, use medicine as prescribed, and mucinex or delsym for cough. Return or see your doctor if further problems °

## 2016-05-07 NOTE — ED Provider Notes (Signed)
[facility]    CSN: [id number] Arrival date & time: 05/07/16  1540     History   Chief Complaint Chief Complaint  Patient presents with  . Nasal Congestion    HPI CHANCIE CHALOUPKA is a 70 y.o. female.   The history is provided by the patient.  URI  Presenting symptoms: congestion and rhinorrhea   Presenting symptoms: no fever   Severity:  Mild Onset quality:  Gradual Duration:  5 days Progression:  Unchanged Chronicity:  New Relieved by:  None tried Worsened by:  Nothing Ineffective treatments:  None tried Risk factors: sick contacts     Past Medical History:  Diagnosis Date  . Arthritis   . Bowel incontinence   . Fluid retention in legs    wears compression stocking R leg  . Gallstones   . GERD (gastroesophageal reflux disease)   . H/O hiatal hernia   . Incontinence of urine    wears adult briefs  . PONV (postoperative nausea and vomiting)   . Scoliosis   . Seasonal allergies   . Shortness of breath    with pain  . Weight loss, unintentional    40 lbs in last year    Patient Active Problem List   Diagnosis Date Noted  . Abnormality of gait 01/04/2013  . Pain of left great toe 01/04/2013    Past Surgical History:  Procedure Laterality Date  . ABDOMINAL HYSTERECTOMY  1975  . adenoidectomy    . AMPUTATION  11/07/2011   Procedure: AMPUTATION RAY;  Surgeon: Newt Minion, MD;  Location: [facility];  Service: Orthopedics;  Laterality: Left;  Left Foot 1st Ray Amputation  . APPENDECTOMY    . BLADDER REPAIR    . BREAST SURGERY  1989   lumpectomy bilat  . DOBUTAMINE STRESS ECHO    . EYE SURGERY     bilat cataracts  . KNEE ARTHROSCOPY     left  . TONSILLECTOMY      OB History    No data available       Home Medications    Prior to Admission medications   Medication Sig Start Date End Date Taking? Authorizing Provider  bisacodyl (DULCOLAX) 5 MG EC tablet Take 5 mg by mouth daily.     Historical Provider, MD  ENSURE PLUS (ENSURE  PLUS) LIQD Take 237 mLs by mouth daily.    Historical Provider, MD  gabapentin (NEURONTIN) 300 MG capsule 300 mg 3 (three) times daily. 03/04/14   Historical Provider, MD  losartan-hydrochlorothiazide Konrad Penta) 50-12.5 MG per tablet  08/08/12   Historical Provider, MD  methocarbamol (ROBAXIN) 500 MG tablet Take 500 mg by mouth 4 (four) times daily.    Historical Provider, MD  MYRBETRIQ 25 MG TB24 tablet 2 (two) times daily.  03/04/14   Historical Provider, MD  naproxen sodium (ANAPROX) 220 MG tablet Take 440 mg by mouth 3 (three) times daily with meals.    Historical Provider, MD  pravastatin (PRAVACHOL) 20 MG tablet  09/05/12   Historical Provider, MD  pseudoephedrine-guaifenesin (MUCINEX D) 60-600 MG per tablet Take 1 tablet by mouth every 12 (twelve) hours.    Historical Provider, MD  triamcinolone cream (KENALOG) 0.1 % 0.1 application as needed.  12/28/12   Historical Provider, MD    Family History History reviewed. No pertinent family history.  Social History Social History  Substance Use Topics  . Smoking status: Current Every Day Smoker    Packs/day: 1.00    Types: Cigarettes  .  Smokeless tobacco: Never Used  . Alcohol use No     Comment: occasional     Allergies   Tape and Tylenol [acetaminophen]   Review of Systems Review of Systems  Constitutional: Negative for fever.  HENT: Positive for congestion, postnasal drip, rhinorrhea and sinus pressure.   Respiratory: Negative.   Cardiovascular: Negative.   All other systems reviewed and are negative.    Physical Exam Triage Vital Signs ED Triage Vitals [05/07/16 1613]  Enc Vitals Group     BP 108/76     Pulse Rate 80     Resp 18     Temp 97.9 F (36.6 C)     Temp src      SpO2 97 %     Weight      Height      Head Circumference      Peak Flow      Pain Score      Pain Loc      Pain Edu?      Excl. in [place]?    No data found.   Updated Vital Signs BP 108/76   Pulse 80   Temp 97.9 F (36.6 C)   Resp 18    SpO2 97%   Visual Acuity Right Eye Distance:   Left Eye Distance:   Bilateral Distance:    Right Eye Near:   Left Eye Near:    Bilateral Near:     Physical Exam  Constitutional: She is oriented to person, place, and time. She appears well-developed and well-nourished.  HENT:  Right Ear: External ear normal.  Left Ear: External ear normal.  Nose: Nose normal.  Mouth/Throat: Oropharynx is clear and moist.  Eyes: Pupils are equal, round, and reactive to light.  Neck: Normal range of motion. Neck supple.  Pulmonary/Chest: Effort normal and breath sounds normal.  Lymphadenopathy:    She has no cervical adenopathy.  Neurological: She is alert and oriented to person, place, and time.  Skin: Skin is warm and dry.  Nursing note and vitals reviewed.    UC Treatments / Results  Labs (all labs ordered are listed, but only abnormal results are displayed) Labs Reviewed - No data to display  EKG  EKG Interpretation None       Radiology No results found.  Procedures Procedures (including critical care time)  Medications Ordered in UC Medications - No data to display   Initial Impression / Assessment and Plan / UC Course  I have reviewed the triage vital signs and the nursing notes.  Pertinent labs & imaging results that were available during my care of the patient were reviewed by me and considered in my medical decision making (see chart for details).  Clinical Course      Final Clinical Impressions(s) / UC Diagnoses   Final diagnoses:  None    New Prescriptions New Prescriptions   No medications on file     Billy Fischer, MD 05/07/16 [phone]

## 2016-05-07 NOTE — ED Triage Notes (Signed)
Pt here for sinus congestion

## 2016-05-27 DIAGNOSIS — Z23 Encounter for immunization: Secondary | ICD-10-CM | POA: Diagnosis not present

## 2016-05-27 DIAGNOSIS — M1990 Unspecified osteoarthritis, unspecified site: Secondary | ICD-10-CM | POA: Diagnosis not present

## 2016-05-27 DIAGNOSIS — M4125 Other idiopathic scoliosis, thoracolumbar region: Secondary | ICD-10-CM | POA: Diagnosis not present

## 2016-05-27 DIAGNOSIS — J449 Chronic obstructive pulmonary disease, unspecified: Secondary | ICD-10-CM | POA: Diagnosis not present

## 2016-06-05 ENCOUNTER — Ambulatory Visit (INDEPENDENT_AMBULATORY_CARE_PROVIDER_SITE_OTHER): Payer: Self-pay | Admitting: Orthopedic Surgery

## 2016-06-05 ENCOUNTER — Encounter (INDEPENDENT_AMBULATORY_CARE_PROVIDER_SITE_OTHER): Payer: Self-pay | Admitting: Orthopedic Surgery

## 2016-06-05 ENCOUNTER — Ambulatory Visit (INDEPENDENT_AMBULATORY_CARE_PROVIDER_SITE_OTHER): Payer: Medicare Other | Admitting: Orthopedic Surgery

## 2016-06-05 ENCOUNTER — Ambulatory Visit (INDEPENDENT_AMBULATORY_CARE_PROVIDER_SITE_OTHER): Payer: Medicare Other

## 2016-06-05 VITALS — Ht 59.0 in | Wt 93.6 lb

## 2016-06-05 DIAGNOSIS — M25511 Pain in right shoulder: Secondary | ICD-10-CM | POA: Diagnosis not present

## 2016-06-05 NOTE — Progress Notes (Signed)
Office Visit Note   Patient: Ashley Howard           Date of Birth: [birth year]           MRN: [id number] Visit Date: 06/05/2016              Requested by: Iona Beard, MD [address] [place], [place] [address] PCP: Maggie Font, MD   Assessment & Plan: Visit Diagnoses:  1. Acute pain of right shoulder    Contusion right upper extremity. Plan: Recommended Aleve 2 by mouth twice a day. Patient has used Naprosyn in the past with good results.  Follow-Up Instructions: Return if symptoms worsen or fail to improve.   Orders:  Orders Placed This Encounter  Procedures  . XR Shoulder Right   No orders of the defined types were placed in this encounter.     Procedures: No procedures performed   Clinical Data: No additional findings.   Subjective: Chief Complaint  Patient presents with  . Right Shoulder - Pain    S/P fall approximately 3 weeks ago.    Patient is status post fall approximately 3 weeks ago. She grabbed her sink on impact and banged her right upper arm and shoulder. She has decreased range of motion, throbbing and pain radiating down to elbow. She is requesting something for pain today. She is having difficulty lifting objects or even holding the paper.    Review of Systems   Objective: Vital Signs: Ht 4\' 11"  (1.499 m)   Wt 93 lb 9.6 oz (42.5 kg)   BMI 18.90 kg/m   Physical Exam on examination patient is alert oriented no adenopathy well-dressed normal affect normal [place] she has difficulty getting from a sitting to a standing position uses a cane. Examination the right shoulder she has abduction and flexion to 100 she does have some pain with Neer and Hawkins impingement test. There is no ecchymosis or bruising on the shoulder she does have some bruising over the forearm but this is asymptomatic.  Ortho Exam  Specialty Comments:  No specialty comments available.  Imaging: Xr Shoulder Right  Result Date: 06/05/2016 2 view  radiographs of the right shoulder shows decreased subacromial joint space there is no acute fractures. No lung field abnormalities. Patient does have a scoliosis.    PMFS History: Patient Active Problem List   Diagnosis Date Noted  . Abnormality of gait 01/04/2013  . Pain of left great toe 01/04/2013   Past Medical History:  Diagnosis Date  . Arthritis   . Bowel incontinence   . Fluid retention in legs    wears compression stocking R leg  . Gallstones   . GERD (gastroesophageal reflux disease)   . H/O hiatal hernia   . Incontinence of urine    wears adult briefs  . PONV (postoperative nausea and vomiting)   . Scoliosis   . Seasonal allergies   . Shortness of breath    with pain  . Weight loss, unintentional    40 lbs in last year    History reviewed. No pertinent family history.  Past Surgical History:  Procedure Laterality Date  . ABDOMINAL HYSTERECTOMY  1975  . adenoidectomy    . AMPUTATION  11/07/2011   Procedure: AMPUTATION RAY;  Surgeon: Newt Minion, MD;  Location: [facility];  Service: Orthopedics;  Laterality: Left;  Left Foot 1st Ray Amputation  . APPENDECTOMY    . BLADDER REPAIR    . BREAST SURGERY  1989   lumpectomy bilat  . DOBUTAMINE STRESS ECHO    . EYE SURGERY     bilat cataracts  . KNEE ARTHROSCOPY     left  . TONSILLECTOMY     Social History   Occupational History  . retired    Social History Main Topics  . Smoking status: Current Every Day Smoker    Packs/day: 1.00    Types: Cigarettes  . Smokeless tobacco: Never Used  . Alcohol use No     Comment: occasional  . Drug use: No  . Sexual activity: Not on file

## 2016-06-10 ENCOUNTER — Ambulatory Visit (INDEPENDENT_AMBULATORY_CARE_PROVIDER_SITE_OTHER): Payer: Self-pay | Admitting: Orthopedic Surgery

## 2016-06-27 ENCOUNTER — Emergency Department (HOSPITAL_COMMUNITY): Payer: Medicare Other

## 2016-06-27 ENCOUNTER — Encounter (HOSPITAL_COMMUNITY): Payer: Self-pay | Admitting: Emergency Medicine

## 2016-06-27 ENCOUNTER — Emergency Department (HOSPITAL_COMMUNITY)
Admission: EM | Admit: 2016-06-27 | Discharge: 2016-06-27 | Disposition: A | Discharge status: Home / Self Care | Payer: Medicare Other | Attending: Emergency Medicine | Admitting: Emergency Medicine

## 2016-06-27 ENCOUNTER — Ambulatory Visit (HOSPITAL_COMMUNITY)
Admission: EM | Admit: 2016-06-27 | Discharge: 2016-06-27 | Disposition: A | Discharge status: Nursing Home (Includes ICF) | Payer: Medicare Other | Source: Home / Self Care | Attending: Emergency Medicine | Admitting: Emergency Medicine

## 2016-06-27 DIAGNOSIS — N3001 Acute cystitis with hematuria: Secondary | ICD-10-CM | POA: Diagnosis not present

## 2016-06-27 DIAGNOSIS — R109 Unspecified abdominal pain: Secondary | ICD-10-CM | POA: Insufficient documentation

## 2016-06-27 DIAGNOSIS — I959 Hypotension, unspecified: Secondary | ICD-10-CM | POA: Diagnosis not present

## 2016-06-27 DIAGNOSIS — R6883 Chills (without fever): Secondary | ICD-10-CM | POA: Insufficient documentation

## 2016-06-27 DIAGNOSIS — R300 Dysuria: Secondary | ICD-10-CM | POA: Diagnosis not present

## 2016-06-27 DIAGNOSIS — Z79899 Other long term (current) drug therapy: Secondary | ICD-10-CM

## 2016-06-27 DIAGNOSIS — F17210 Nicotine dependence, cigarettes, uncomplicated: Secondary | ICD-10-CM | POA: Diagnosis not present

## 2016-06-27 DIAGNOSIS — R630 Anorexia: Secondary | ICD-10-CM

## 2016-06-27 DIAGNOSIS — R0602 Shortness of breath: Secondary | ICD-10-CM | POA: Diagnosis not present

## 2016-06-27 DIAGNOSIS — K8020 Calculus of gallbladder without cholecystitis without obstruction: Secondary | ICD-10-CM | POA: Diagnosis not present

## 2016-06-27 LAB — URINALYSIS, ROUTINE W REFLEX MICROSCOPIC
Bilirubin Urine: NEGATIVE
GLUCOSE, UA: NEGATIVE mg/dL
KETONES UR: 20 mg/dL — AB
Leukocytes, UA: NEGATIVE
Nitrite: POSITIVE — AB
PH: 6 (ref 5.0–8.0)
PROTEIN: NEGATIVE mg/dL
Specific Gravity, Urine: 1.016 (ref 1.005–1.030)

## 2016-06-27 LAB — POCT URINALYSIS DIP (DEVICE)
GLUCOSE, UA: NEGATIVE mg/dL
KETONES UR: 15 mg/dL — AB
Nitrite: NEGATIVE
Protein, ur: 30 mg/dL — AB
SPECIFIC GRAVITY, URINE: 1.025 (ref 1.005–1.030)
Urobilinogen, UA: 0.2 mg/dL (ref 0.0–1.0)
pH: 6.5 (ref 5.0–8.0)

## 2016-06-27 LAB — COMPREHENSIVE METABOLIC PANEL
ALBUMIN: 3.2 g/dL — AB (ref 3.5–5.0)
ALK PHOS: 56 U/L (ref 38–126)
ALT: 31 U/L (ref 14–54)
AST: 31 U/L (ref 15–41)
Anion gap: 14 (ref 5–15)
BUN: 38 mg/dL — ABNORMAL HIGH (ref 6–20)
CALCIUM: 9.4 mg/dL (ref 8.9–10.3)
CHLORIDE: 96 mmol/L — AB (ref 101–111)
CO2: 25 mmol/L (ref 22–32)
CREATININE: 1.03 mg/dL — AB (ref 0.44–1.00)
GFR calc Af Amer: >60 mL/min (ref >60)
GFR calc non Af Amer: 53 mL/min — ABNORMAL LOW (ref >60)
Glucose, Bld: 104 mg/dL — ABNORMAL HIGH (ref 65–99)
Potassium: 3.8 mmol/L (ref 3.5–5.1)
SODIUM: 135 mmol/L (ref 135–145)
Total Bilirubin: 1.1 mg/dL (ref 0.3–1.2)
Total Protein: 6.6 g/dL (ref 6.5–8.1)

## 2016-06-27 LAB — HEPATIC FUNCTION PANEL
ALT: 24 U/L (ref 14–54)
AST: 24 U/L (ref 15–41)
Albumin: 2.3 g/dL — ABNORMAL LOW (ref 3.5–5.0)
Alkaline Phosphatase: 40 U/L (ref 38–126)
BILIRUBIN DIRECT: 0.2 mg/dL (ref 0.1–0.5)
BILIRUBIN TOTAL: 1.3 mg/dL — AB (ref 0.3–1.2)
Indirect Bilirubin: 1.1 mg/dL — ABNORMAL HIGH (ref 0.3–0.9)
Total Protein: 4.5 g/dL — ABNORMAL LOW (ref 6.5–8.1)

## 2016-06-27 LAB — I-STAT CG4 LACTIC ACID, ED
LACTIC ACID, VENOUS: 0.58 mmol/L (ref 0.5–1.9)
Lactic Acid, Venous: 1.75 mmol/L (ref 0.5–1.9)

## 2016-06-27 LAB — CBC
HCT: 34.3 % — ABNORMAL LOW (ref 36.0–46.0)
Hemoglobin: 11.7 g/dL — ABNORMAL LOW (ref 12.0–15.0)
MCH: 33.6 pg (ref 26.0–34.0)
MCHC: 34.1 g/dL (ref 30.0–36.0)
MCV: 98.6 fL (ref 78.0–100.0)
PLATELETS: 256 10*3/uL (ref 150–400)
RBC: 3.48 MIL/uL — ABNORMAL LOW (ref 3.87–5.11)
RDW: 13.1 % (ref 11.5–15.5)
WBC: 9.2 10*3/uL (ref 4.0–10.5)

## 2016-06-27 LAB — LIPASE, BLOOD: Lipase: 15 U/L (ref 11–51)

## 2016-06-27 MED ORDER — DEXTROSE 5 % IV SOLN
1.0000 g | Freq: Once | INTRAVENOUS | Status: AC
  Administered 2016-06-27: 1 g via INTRAVENOUS
  Filled 2016-06-27: qty 10

## 2016-06-27 MED ORDER — SODIUM CHLORIDE 0.9 % IV BOLUS (SEPSIS)
2000.0000 mL | Freq: Once | INTRAVENOUS | Status: AC
  Administered 2016-06-27: 2000 mL via INTRAVENOUS

## 2016-06-27 MED ORDER — SODIUM CHLORIDE 0.9 % IV BOLUS (SEPSIS)
1000.0000 mL | Freq: Once | INTRAVENOUS | Status: AC
  Administered 2016-06-27: 1000 mL via INTRAVENOUS

## 2016-06-27 MED ORDER — LEVOFLOXACIN 500 MG PO TABS: 500.0000 mg | ORAL_TABLET | Freq: Every day | ORAL | 0 refills | Status: DC

## 2016-06-27 MED ORDER — LEVOFLOXACIN 500 MG PO TABS
500.0000 mg | ORAL_TABLET | Freq: Once | ORAL | Status: AC
  Administered 2016-06-27: 500 mg via ORAL
  Filled 2016-06-27: qty 1

## 2016-06-27 NOTE — ED Provider Notes (Signed)
[name]    CSN: [id number] Arrival date & time: 06/27/16  1057     History   Chief Complaint Chief Complaint  Patient presents with  . Recurrent UTI    HPI Ashley Howard is a 70 y.o. female.   HPI She is a 70 year old woman here for evaluation of foul-smelling urine. She reports about a weeklong history of foul-smelling urine. She denies any dysuria.  She does report some lower abdominal pain. She reports loss of appetite, but denies nausea. She reports a subjective fever at home yesterday. Today, she is complaining of chills. No specific flank pain. She has not been eating much of anything for the last 5 days.  Past Medical History:  Diagnosis Date  . Arthritis   . Bowel incontinence   . Fluid retention in legs    wears compression stocking R leg  . Gallstones   . GERD (gastroesophageal reflux disease)   . H/O hiatal hernia   . Incontinence of urine    wears adult briefs  . PONV (postoperative nausea and vomiting)   . Scoliosis   . Seasonal allergies   . Shortness of breath    with pain  . Weight loss, unintentional    40 lbs in last year    Patient Active Problem List   Diagnosis Date Noted  . Abnormality of gait 01/04/2013  . Pain of left great toe 01/04/2013    Past Surgical History:  Procedure Laterality Date  . ABDOMINAL HYSTERECTOMY  1975  . adenoidectomy    . AMPUTATION  11/07/2011   Procedure: AMPUTATION RAY;  Surgeon: Newt Minion, MD;  Location: [facility];  Service: Orthopedics;  Laterality: Left;  Left Foot 1st Ray Amputation  . APPENDECTOMY    . BLADDER REPAIR    . BREAST SURGERY  1989   lumpectomy bilat  . DOBUTAMINE STRESS ECHO    . EYE SURGERY     bilat cataracts  . KNEE ARTHROSCOPY     left  . TONSILLECTOMY      OB History    No data available       Home Medications    Prior to Admission medications   Medication Sig Start Date End Date Taking? Authorizing Provider  bisacodyl (DULCOLAX) 5 MG EC tablet Take 5 mg  by mouth daily.    Yes Historical Provider, MD  gabapentin (NEURONTIN) 300 MG capsule 300 mg 3 (three) times daily. 03/04/14  Yes Historical Provider, MD  losartan-hydrochlorothiazide Konrad Penta) 50-12.5 MG per tablet  08/08/12  Yes Historical Provider, MD  methocarbamol (ROBAXIN) 500 MG tablet Take 500 mg by mouth 4 (four) times daily.   Yes Historical Provider, MD  MYRBETRIQ 25 MG TB24 tablet 2 (two) times daily.  03/04/14  Yes Historical Provider, MD  ENSURE PLUS (ENSURE PLUS) LIQD Take 237 mLs by mouth daily.    Historical Provider, MD  ipratropium (ATROVENT) 0.06 % nasal spray Place 2 sprays into both nostrils 4 (four) times daily. 05/07/16   Billy Fischer, MD  naproxen sodium (ANAPROX) 220 MG tablet Take 440 mg by mouth 3 (three) times daily with meals.    Historical Provider, MD  pravastatin (PRAVACHOL) 20 MG tablet  09/05/12   Historical Provider, MD  pseudoephedrine-guaifenesin (MUCINEX D) 60-600 MG per tablet Take 1 tablet by mouth every 12 (twelve) hours.    Historical Provider, MD  triamcinolone cream (KENALOG) 0.1 % 0.1 application as needed.  12/28/12   Historical Provider, MD    Family  History History reviewed. No pertinent family history.  Social History Social History  Substance Use Topics  . Smoking status: Current Every Day Smoker    Packs/day: 1.00    Types: Cigarettes  . Smokeless tobacco: Never Used  . Alcohol use No     Comment: occasional     Allergies   Tape and Tylenol [acetaminophen]   Review of Systems Review of Systems As in history of present illness  Physical Exam Triage Vital Signs ED Triage Vitals  Enc Vitals Group     BP      Pulse      Resp      Temp      Temp src      SpO2      Weight      Height      Head Circumference      Peak Flow      Pain Score      Pain Loc      Pain Edu?      Excl. in [place]?    No data found.   Updated Vital Signs BP (!) 89/58 (BP Location: Left Arm)   Pulse 77   Temp 97.7 F (36.5 C) (Oral)   Resp 22    SpO2 97%   Visual Acuity Right Eye Distance:   Left Eye Distance:   Bilateral Distance:    Right Eye Near:   Left Eye Near:    Bilateral Near:     Physical Exam  Constitutional: She is oriented to person, place, and time.  Very thin elderly woman. Sitting in wheelchair.  Cardiovascular: Normal rate.   Pulmonary/Chest: Effort normal.  Abdominal: Soft. There is tenderness (suprapubic). There is no rebound.  Mild left CVA tenderness  Neurological: She is alert and oriented to person, place, and time.     UC Treatments / Results  Labs (all labs ordered are listed, but only abnormal results are displayed) Labs Reviewed  POCT URINALYSIS DIP (DEVICE) - Abnormal; Notable for the following:       Result Value   Bilirubin Urine MODERATE (*)    Ketones, ur 15 (*)    Hgb urine dipstick SMALL (*)    Protein, ur 30 (*)    Leukocytes, UA TRACE (*)    All other components within normal limits  URINE CULTURE    EKG  EKG Interpretation None       Radiology No results found.  Procedures Procedures (including critical care time)  Medications Ordered in UC Medications - No data to display   Initial Impression / Assessment and Plan / UC Course  I have reviewed the triage vital signs and the nursing notes.  Pertinent labs & imaging results that were available during my care of the patient were reviewed by me and considered in my medical decision making (see chart for details).  Clinical Course     UA is consistent with infection. Given the hypotension, loss of appetite, chills, and CVA tenderness, will transfer to the ER via shuttle for additional evaluation and management. Concerned for pyelonephritis in frail elderly woman.  Final Clinical Impressions(s) / UC Diagnoses   Final diagnoses:  Hypotension, unspecified hypotension type  Acute cystitis with hematuria    New Prescriptions New Prescriptions   No medications on file     Melony Overly, MD 06/27/16 1235

## 2016-06-27 NOTE — ED Notes (Signed)
Patient at xray

## 2016-06-27 NOTE — ED Notes (Signed)
Taken to CT.

## 2016-06-27 NOTE — Discharge Instructions (Signed)
Your urine is consistent with an infection. Given the reported fever yesterday, chills, and low blood pressure, we are going to send you to the emergency room for additional management.

## 2016-06-27 NOTE — ED Notes (Signed)
I&O cath performed with Charlett Nose EMT.

## 2016-06-27 NOTE — ED Provider Notes (Signed)
[facility] DEPT Provider Note   CSN: [id number] Arrival date & time: 06/27/16  1259     History   Chief Complaint Chief Complaint  Patient presents with  . Abdominal Pain  . Recurrent UTI    HPI Ashley Howard is a 70 y.o. female.  Patient complains of dysuria and foul-smelling urine. She also complains of some right flank pain.   The history is provided by the patient. No language interpreter was used.  Dysuria   This is a recurrent problem. The current episode started 12 to 24 hours ago. The problem occurs every urination. The problem has not changed since onset.The quality of the pain is described as burning. The pain is at a severity of 5/10. The pain is moderate. There has been no fever. She is not sexually active. There is a history of pyelonephritis. Pertinent negatives include no chills, no frequency and no hematuria.    Past Medical History:  Diagnosis Date  . Arthritis   . Bowel incontinence   . Fluid retention in legs    wears compression stocking R leg  . Gallstones   . GERD (gastroesophageal reflux disease)   . H/O hiatal hernia   . Incontinence of urine    wears adult briefs  . PONV (postoperative nausea and vomiting)   . Scoliosis   . Seasonal allergies   . Shortness of breath    with pain  . Weight loss, unintentional    40 lbs in last year    Patient Active Problem List   Diagnosis Date Noted  . Abnormality of gait 01/04/2013  . Pain of left great toe 01/04/2013    Past Surgical History:  Procedure Laterality Date  . ABDOMINAL HYSTERECTOMY  1975  . adenoidectomy    . AMPUTATION  11/07/2011   Procedure: AMPUTATION RAY;  Surgeon: Newt Minion, MD;  Location: [name];  Service: Orthopedics;  Laterality: Left;  Left Foot 1st Ray Amputation  . APPENDECTOMY    . BLADDER REPAIR    . BREAST SURGERY  1989   lumpectomy bilat  . DOBUTAMINE STRESS ECHO    . EYE SURGERY     bilat cataracts  . KNEE ARTHROSCOPY     left  . TONSILLECTOMY       OB History    No data available       Home Medications    Prior to Admission medications   Medication Sig Start Date End Date Taking? Authorizing Provider  bisacodyl (DULCOLAX) 5 MG EC tablet Take 5 mg by mouth daily.    Yes Historical Provider, MD  diphenhydrAMINE (BENADRYL) 25 MG tablet Take 25 mg by mouth every 6 (six) hours as needed for allergies.   Yes Historical Provider, MD  ENSURE PLUS (ENSURE PLUS) LIQD Take 237 mLs by mouth daily.   Yes Historical Provider, MD  gabapentin (NEURONTIN) 300 MG capsule Take 300 mg by mouth 3 (three) times daily.  03/04/14  Yes Historical Provider, MD  ipratropium (ATROVENT) 0.06 % nasal spray Place 2 sprays into both nostrils 4 (four) times daily. 05/07/16  Yes Billy Fischer, MD  losartan-hydrochlorothiazide (HYZAAR) 50-12.5 MG per tablet Take 1 tablet by mouth daily.  08/08/12  Yes Historical Provider, MD  methocarbamol (ROBAXIN) 500 MG tablet Take 500 mg by mouth daily.    Yes Historical Provider, MD  MYRBETRIQ 25 MG TB24 tablet Take 25 mg by mouth 2 (two) times daily.  03/04/14  Yes Historical Provider, MD  naproxen sodium (ANAPROX) 220  MG tablet Take 440 mg by mouth 2 (two) times daily as needed (pain).    Yes Historical Provider, MD  pravastatin (PRAVACHOL) 20 MG tablet Take 20 mg by mouth daily.  09/05/12  Yes Historical Provider, MD  VESICARE 10 MG tablet Take 10 mg by mouth daily. 05/15/16  Yes Historical Provider, MD  levofloxacin (LEVAQUIN) 500 MG tablet Take 1 tablet (500 mg total) by mouth daily. 06/27/16   Milton Ferguson, MD    Family History History reviewed. No pertinent family history.  Social History Social History  Substance Use Topics  . Smoking status: Current Every Day Smoker    Packs/day: 1.00    Types: Cigarettes  . Smokeless tobacco: Never Used  . Alcohol use No     Comment: occasional     Allergies   Tape and Tylenol [acetaminophen]   Review of Systems Review of Systems  Constitutional: Negative for  appetite change, chills and fatigue.  HENT: Negative for congestion, ear discharge and sinus pressure.   Eyes: Negative for discharge.  Respiratory: Negative for cough.   Cardiovascular: Negative for chest pain.  Gastrointestinal: Negative for abdominal pain and diarrhea.  Genitourinary: Positive for dysuria. Negative for frequency and hematuria.  Musculoskeletal: Negative for back pain.  Skin: Negative for rash.  Neurological: Negative for seizures and headaches.  Psychiatric/Behavioral: Negative for hallucinations.     Physical Exam Updated Vital Signs BP 114/73   Pulse 77   Temp 99.5 F (37.5 C) (Rectal)   Resp 19   Ht 4\' 11"  (1.499 m)   Wt 95 lb (43.1 kg)   SpO2 100%   BMI 19.19 kg/m   Physical Exam  Constitutional: She is oriented to person, place, and time. She appears well-developed.  HENT:  Head: Normocephalic.  Eyes: Conjunctivae and EOM are normal. No scleral icterus.  Neck: Neck supple. No thyromegaly present.  Cardiovascular: Normal rate and regular rhythm.  Exam reveals no gallop and no friction rub.   No murmur heard. Pulmonary/Chest: No stridor. She has no wheezes. She has no rales. She exhibits no tenderness.  Abdominal: She exhibits no distension. There is no tenderness. There is no rebound.  Genitourinary:  Genitourinary Comments: Tender right flank mild  Musculoskeletal: Normal range of motion. She exhibits no edema.  Lymphadenopathy:    She has no cervical adenopathy.  Neurological: She is oriented to person, place, and time. She exhibits normal muscle tone. Coordination normal.  Skin: No rash noted. No erythema.  Psychiatric: She has a normal mood and affect. Her behavior is normal.     ED Treatments / Results  Labs (all labs ordered are listed, but only abnormal results are displayed) Labs Reviewed  COMPREHENSIVE METABOLIC PANEL - Abnormal; Notable for the following:       Result Value   Chloride 96 (*)    Glucose, Bld 104 (*)    BUN 38  (*)    Creatinine, Ser 1.03 (*)    Albumin 3.2 (*)    GFR calc non Af Amer 53 (*)    All other components within normal limits  CBC - Abnormal; Notable for the following:    RBC 3.48 (*)    Hemoglobin 11.7 (*)    HCT 34.3 (*)    All other components within normal limits  URINALYSIS, ROUTINE W REFLEX MICROSCOPIC - Abnormal; Notable for the following:    APPearance HAZY (*)    Hgb urine dipstick SMALL (*)    Ketones, ur 20 (*)    Nitrite  POSITIVE (*)    Bacteria, UA FEW (*)    Squamous Epithelial / LPF 0-5 (*)    All other components within normal limits  HEPATIC FUNCTION PANEL - Abnormal; Notable for the following:    Total Protein 4.5 (*)    Albumin 2.3 (*)    Total Bilirubin 1.3 (*)    Indirect Bilirubin 1.1 (*)    All other components within normal limits  CULTURE, BLOOD (ROUTINE X 2)  CULTURE, BLOOD (ROUTINE X 2)  URINE CULTURE  LIPASE, BLOOD  I-STAT CG4 LACTIC ACID, ED  I-STAT CG4 LACTIC ACID, ED    EKG  EKG Interpretation None       Radiology Dg Chest 2 View  Result Date: 06/27/2016 CLINICAL DATA:  Patient with sticking sensation while drinking fluids. History of hiatal hernia. Shortness of breath. EXAM: CHEST  2 VIEW COMPARISON:  Chest radiograph 11/07/2011. FINDINGS: Normal cardiac and mediastinal contours with tortuosity and calcification of the thoracic aorta. Patchy consolidation within the right mid lung. No pleural effusion or pneumothorax. Osseous demineralization. Mid thoracic spine degenerative changes. Aortic vascular calcification. IMPRESSION: Patchy opacities within the right mid lung may represent infection in the appropriate clinical setting. Underlying lesion not excluded. Recommend follow-up chest radiograph in 4-6 weeks. Aortic atherosclerosis. Electronically Signed   By: Lovey Newcomer M.D.   On: 06/27/2016 17:27   Ct Renal Stone Study  Result Date: 06/27/2016 CLINICAL DATA:  Lower abdominal pain, fever, foul smelling urine EXAM: CT ABDOMEN AND  PELVIS WITHOUT CONTRAST TECHNIQUE: Multidetector CT imaging of the abdomen and pelvis was performed following the standard protocol without IV contrast. COMPARISON:  08/12/2011 FINDINGS: Lower chest: Lung bases are normal. Hepatobiliary: Unenhanced liver shows no biliary ductal dilatation. At least 2 calcified gallstones are noted within gallbladder the largest measures 1 cm. Pancreas: Unenhanced pancreas is poorly visualized Spleen: Unenhanced spleen is normal. Adrenals/Urinary Tract: No adrenal gland mass. No definite nephrolithiasis. No hydronephrosis or hydroureter. Vascular calcifications are noted bilateral renal hilum. Mild distended urinary bladder without focal filling defects. Stomach/Bowel: There are multiple distended small bowel loops within abdomen and pelvis with fluid and some air-fluid levels. There is no transition point in caliber of small bowel. Findings are highly suspicious for significant ileus or bowel obstruction. Clinical correlation is necessary. Vascular/Lymphatic: Extensive atherosclerotic calcifications of abdominal aorta and iliac arteries are noted. Some colonic gas noted in right colon and hepatic flexure of the colon. Moderate stool noted within redundant transverse colon. Descending colon is partially empty. Sigmoid colon is not distended. Reproductive: Unenhanced uterus is poorly visualized probable atrophic. Other: No ascites or free abdominal air. Musculoskeletal: There are degenerative changes pubic symphysis. There is dextroscoliosis of lower thoracic spine and levoscoliosis of the lumbar spine. Degenerative changes are noted thoracolumbar spine. IMPRESSION: 1. No nephrolithiasis.  No hydronephrosis or hydroureter. 2. No calcified calculi are noted within urinary bladder. 3. At least 2 calcified gallstones are noted within gallbladder the largest measures 1 cm. 4. There are fluid distended small bowel loops with some air-fluid levels. Findings highly suspicious for small bowel  obstruction or significant ileus. No transition point in caliber of small bowel is noted. Limited study without IV and oral contrast. 5. Moderate stool noted within redundant transverse colon. No definite evidence of colonic obstruction. 6. Extensive calcifications are noted abdominal aorta and iliac arteries. 7. No ascites or free abdominal air. 8. Scoliosis thoracolumbar spine. Degenerative changes as described above. Electronically Signed   By: Lahoma Crocker M.D.   On: 06/27/2016  16:59    Procedures Procedures (including critical care time)  Medications Ordered in ED Medications  levofloxacin (LEVAQUIN) tablet 500 mg (not administered)  sodium chloride 0.9 % bolus 2,000 mL (0 mLs Intravenous Stopped 06/27/16 1754)  cefTRIAXone (ROCEPHIN) 1 g in dextrose 5 % 50 mL IVPB (0 g Intravenous Stopped 06/27/16 1754)  sodium chloride 0.9 % bolus 1,000 mL (0 mLs Intravenous Stopped 06/27/16 1952)     Initial Impression / Assessment and Plan / ED Course  I have reviewed the triage vital signs and the nursing notes.  Pertinent labs & imaging results that were available during my care of the patient were reviewed by me and considered in my medical decision making (see chart for details).  Clinical Course     Labs unremarkable except for mild dehydration and urinary tract infection. Patient was given some fluids and Levaquin for infection. Chest x-ray suggests the possibility of a pneumonia but patient has no respiratory symptoms. She will follow-up with her PCP  Final Clinical Impressions(s) / ED Diagnoses   Final diagnoses:  Dysuria    New Prescriptions New Prescriptions   LEVOFLOXACIN (LEVAQUIN) 500 MG TABLET    Take 1 tablet (500 mg total) by mouth daily.     Milton Ferguson, MD 06/27/16 2031

## 2016-06-27 NOTE — ED Triage Notes (Signed)
Pt went to urgent care for abd pain and questionable urinary tract infection. Pt states she has had several UTIs over the years. Pt sent here for ruling out pyelonephritis. Pt states it burns when she urinates.

## 2016-06-27 NOTE — Discharge Instructions (Signed)
Take Tylenol or Motrin for pain. Drink plenty of fluids. Start taking antibiotic tomorrow. Follow-up with your primary care doctor next week. Return if problems sooner

## 2016-06-27 NOTE — ED Triage Notes (Signed)
C/o poss UTI sx onset 1 week ++ associated w/abd pain, foul urine odor  Also reports constipation since Friday... taking dulcolax yest  Brought back on wheelchair  A&O x4... NAD

## 2016-06-29 LAB — URINE CULTURE: Culture: >=100000 — AB

## 2016-07-02 LAB — CULTURE, BLOOD (ROUTINE X 2)
Culture: NO GROWTH
Culture: NO GROWTH

## 2016-07-09 DIAGNOSIS — N390 Urinary tract infection, site not specified: Secondary | ICD-10-CM | POA: Diagnosis not present

## 2016-07-09 DIAGNOSIS — J449 Chronic obstructive pulmonary disease, unspecified: Secondary | ICD-10-CM | POA: Diagnosis not present

## 2016-07-15 DIAGNOSIS — J690 Pneumonitis due to inhalation of food and vomit: Secondary | ICD-10-CM

## 2016-07-15 DIAGNOSIS — Z8719 Personal history of other diseases of the digestive system: Secondary | ICD-10-CM

## 2016-07-15 HISTORY — DX: Pneumonitis due to inhalation of food and vomit: J69.0

## 2016-07-15 HISTORY — DX: Personal history of other diseases of the digestive system: Z87.19

## 2016-07-16 ENCOUNTER — Inpatient Hospital Stay (HOSPITAL_COMMUNITY)
Admission: EM | Admit: 2016-07-16 | Discharge: 2016-07-24 | DRG: 871 | Disposition: A | Discharge status: Skilled Nursing Facility | Payer: Medicare Other | Attending: Family Medicine | Admitting: Family Medicine

## 2016-07-16 ENCOUNTER — Emergency Department (HOSPITAL_COMMUNITY): Payer: Medicare Other

## 2016-07-16 ENCOUNTER — Encounter (HOSPITAL_COMMUNITY): Payer: Self-pay

## 2016-07-16 ENCOUNTER — Inpatient Hospital Stay (HOSPITAL_COMMUNITY): Payer: Medicare Other

## 2016-07-16 DIAGNOSIS — E46 Unspecified protein-calorie malnutrition: Secondary | ICD-10-CM | POA: Diagnosis not present

## 2016-07-16 DIAGNOSIS — K8020 Calculus of gallbladder without cholecystitis without obstruction: Secondary | ICD-10-CM | POA: Diagnosis not present

## 2016-07-16 DIAGNOSIS — K56699 Other intestinal obstruction unspecified as to partial versus complete obstruction: Secondary | ICD-10-CM | POA: Diagnosis not present

## 2016-07-16 DIAGNOSIS — D649 Anemia, unspecified: Secondary | ICD-10-CM | POA: Diagnosis not present

## 2016-07-16 DIAGNOSIS — M6281 Muscle weakness (generalized): Secondary | ICD-10-CM | POA: Diagnosis not present

## 2016-07-16 DIAGNOSIS — E43 Unspecified severe protein-calorie malnutrition: Secondary | ICD-10-CM | POA: Diagnosis not present

## 2016-07-16 DIAGNOSIS — Z79899 Other long term (current) drug therapy: Secondary | ICD-10-CM

## 2016-07-16 DIAGNOSIS — E860 Dehydration: Secondary | ICD-10-CM | POA: Diagnosis present

## 2016-07-16 DIAGNOSIS — I248 Other forms of acute ischemic heart disease: Secondary | ICD-10-CM | POA: Diagnosis not present

## 2016-07-16 DIAGNOSIS — K56609 Unspecified intestinal obstruction, unspecified as to partial versus complete obstruction: Secondary | ICD-10-CM

## 2016-07-16 DIAGNOSIS — K920 Hematemesis: Secondary | ICD-10-CM | POA: Diagnosis not present

## 2016-07-16 DIAGNOSIS — Z89412 Acquired absence of left great toe: Secondary | ICD-10-CM

## 2016-07-16 DIAGNOSIS — R0989 Other specified symptoms and signs involving the circulatory and respiratory systems: Secondary | ICD-10-CM | POA: Diagnosis not present

## 2016-07-16 DIAGNOSIS — Z681 Body mass index (BMI) 19 or less, adult: Secondary | ICD-10-CM | POA: Diagnosis not present

## 2016-07-16 DIAGNOSIS — R600 Localized edema: Secondary | ICD-10-CM | POA: Diagnosis not present

## 2016-07-16 DIAGNOSIS — J9690 Respiratory failure, unspecified, unspecified whether with hypoxia or hypercapnia: Secondary | ICD-10-CM

## 2016-07-16 DIAGNOSIS — N390 Urinary tract infection, site not specified: Secondary | ICD-10-CM | POA: Diagnosis not present

## 2016-07-16 DIAGNOSIS — Z888 Allergy status to other drugs, medicaments and biological substances status: Secondary | ICD-10-CM

## 2016-07-16 DIAGNOSIS — E876 Hypokalemia: Secondary | ICD-10-CM | POA: Diagnosis not present

## 2016-07-16 DIAGNOSIS — J302 Other seasonal allergic rhinitis: Secondary | ICD-10-CM | POA: Diagnosis present

## 2016-07-16 DIAGNOSIS — Z4682 Encounter for fitting and adjustment of non-vascular catheter: Secondary | ICD-10-CM | POA: Diagnosis not present

## 2016-07-16 DIAGNOSIS — J90 Pleural effusion, not elsewhere classified: Secondary | ICD-10-CM | POA: Diagnosis not present

## 2016-07-16 DIAGNOSIS — M1990 Unspecified osteoarthritis, unspecified site: Secondary | ICD-10-CM | POA: Diagnosis not present

## 2016-07-16 DIAGNOSIS — A419 Sepsis, unspecified organism: Secondary | ICD-10-CM | POA: Diagnosis not present

## 2016-07-16 DIAGNOSIS — R488 Other symbolic dysfunctions: Secondary | ICD-10-CM | POA: Diagnosis not present

## 2016-07-16 DIAGNOSIS — K6389 Other specified diseases of intestine: Secondary | ICD-10-CM | POA: Diagnosis not present

## 2016-07-16 DIAGNOSIS — M419 Scoliosis, unspecified: Secondary | ICD-10-CM | POA: Diagnosis present

## 2016-07-16 DIAGNOSIS — K5669 Other partial intestinal obstruction: Secondary | ICD-10-CM | POA: Diagnosis not present

## 2016-07-16 DIAGNOSIS — J9601 Acute respiratory failure with hypoxia: Secondary | ICD-10-CM | POA: Diagnosis not present

## 2016-07-16 DIAGNOSIS — I5032 Chronic diastolic (congestive) heart failure: Secondary | ICD-10-CM | POA: Diagnosis not present

## 2016-07-16 DIAGNOSIS — K567 Ileus, unspecified: Secondary | ICD-10-CM | POA: Diagnosis present

## 2016-07-16 DIAGNOSIS — J690 Pneumonitis due to inhalation of food and vomit: Secondary | ICD-10-CM | POA: Diagnosis present

## 2016-07-16 DIAGNOSIS — K219 Gastro-esophageal reflux disease without esophagitis: Secondary | ICD-10-CM | POA: Diagnosis not present

## 2016-07-16 DIAGNOSIS — Z0189 Encounter for other specified special examinations: Secondary | ICD-10-CM

## 2016-07-16 DIAGNOSIS — J189 Pneumonia, unspecified organism: Secondary | ICD-10-CM

## 2016-07-16 DIAGNOSIS — K449 Diaphragmatic hernia without obstruction or gangrene: Secondary | ICD-10-CM | POA: Diagnosis not present

## 2016-07-16 DIAGNOSIS — R579 Shock, unspecified: Secondary | ICD-10-CM | POA: Diagnosis not present

## 2016-07-16 DIAGNOSIS — R6521 Severe sepsis with septic shock: Secondary | ICD-10-CM | POA: Diagnosis present

## 2016-07-16 DIAGNOSIS — R0902 Hypoxemia: Secondary | ICD-10-CM | POA: Diagnosis not present

## 2016-07-16 DIAGNOSIS — F17210 Nicotine dependence, cigarettes, uncomplicated: Secondary | ICD-10-CM | POA: Diagnosis present

## 2016-07-16 DIAGNOSIS — R109 Unspecified abdominal pain: Secondary | ICD-10-CM | POA: Diagnosis not present

## 2016-07-16 LAB — URINALYSIS, ROUTINE W REFLEX MICROSCOPIC
Bilirubin Urine: NEGATIVE
Glucose, UA: NEGATIVE mg/dL
HGB URINE DIPSTICK: NEGATIVE
KETONES UR: NEGATIVE mg/dL
Leukocytes, UA: NEGATIVE
NITRITE: NEGATIVE
PROTEIN: NEGATIVE mg/dL
SPECIFIC GRAVITY, URINE: 1.017 (ref 1.005–1.030)
pH: 5 (ref 5.0–8.0)

## 2016-07-16 LAB — PROTIME-INR
INR: 1.06
Prothrombin Time: 13.9 seconds (ref 11.4–15.2)

## 2016-07-16 LAB — COMPREHENSIVE METABOLIC PANEL
ALT: 14 U/L (ref 14–54)
AST: 26 U/L (ref 15–41)
Albumin: 2.8 g/dL — ABNORMAL LOW (ref 3.5–5.0)
Alkaline Phosphatase: 61 U/L (ref 38–126)
Anion gap: 11 (ref 5–15)
BILIRUBIN TOTAL: 0.9 mg/dL (ref 0.3–1.2)
BUN: 53 mg/dL — ABNORMAL HIGH (ref 6–20)
CO2: 35 mmol/L — AB (ref 22–32)
Calcium: 8.6 mg/dL — ABNORMAL LOW (ref 8.9–10.3)
Chloride: 95 mmol/L — ABNORMAL LOW (ref 101–111)
Creatinine, Ser: 1.02 mg/dL — ABNORMAL HIGH (ref 0.44–1.00)
GFR calc Af Amer: >60 mL/min (ref >60)
GFR calc non Af Amer: 54 mL/min — ABNORMAL LOW (ref >60)
GLUCOSE: 121 mg/dL — AB (ref 65–99)
POTASSIUM: 3.9 mmol/L (ref 3.5–5.1)
SODIUM: 141 mmol/L (ref 135–145)
TOTAL PROTEIN: 5.6 g/dL — AB (ref 6.5–8.1)

## 2016-07-16 LAB — SAMPLE TO BLOOD BANK

## 2016-07-16 LAB — CBC WITH DIFFERENTIAL/PLATELET
Basophils Absolute: 0 10*3/uL (ref 0.0–0.1)
Basophils Relative: 0 %
Eosinophils Absolute: 0 10*3/uL (ref 0.0–0.7)
Eosinophils Relative: 0 %
HEMATOCRIT: 24.4 % — AB (ref 36.0–46.0)
HEMOGLOBIN: 8.7 g/dL — AB (ref 12.0–15.0)
LYMPHS PCT: 4 %
Lymphs Abs: 0.7 10*3/uL (ref 0.7–4.0)
MCH: 34.9 pg — ABNORMAL HIGH (ref 26.0–34.0)
MCHC: 35.7 g/dL (ref 30.0–36.0)
MCV: 98 fL (ref 78.0–100.0)
MONO ABS: 0.8 10*3/uL (ref 0.1–1.0)
Monocytes Relative: 5 %
NEUTROS ABS: 15.6 10*3/uL — AB (ref 1.7–7.7)
NEUTROS PCT: 91 %
Platelets: 315 10*3/uL (ref 150–400)
RBC: 2.49 MIL/uL — ABNORMAL LOW (ref 3.87–5.11)
RDW: 13.8 % (ref 11.5–15.5)
WBC: 17.2 10*3/uL — ABNORMAL HIGH (ref 4.0–10.5)

## 2016-07-16 LAB — I-STAT CG4 LACTIC ACID, ED: LACTIC ACID, VENOUS: 2.34 mmol/L — AB (ref 0.5–1.9)

## 2016-07-16 LAB — ETHANOL

## 2016-07-16 LAB — POC OCCULT BLOOD, ED: Fecal Occult Bld: NEGATIVE

## 2016-07-16 MED ORDER — IOPAMIDOL (ISOVUE-300) INJECTION 61%
INTRAVENOUS | Status: AC
  Filled 2016-07-16: qty 100

## 2016-07-16 MED ORDER — ENOXAPARIN SODIUM 30 MG/0.3ML ~~LOC~~ SOLN: 30.0000 mg | SUBCUTANEOUS | Status: DC

## 2016-07-16 MED ORDER — SODIUM CHLORIDE 0.9 % IV BOLUS (SEPSIS)
500.0000 mL | Freq: Once | INTRAVENOUS | Status: AC
  Administered 2016-07-16: 500 mL via INTRAVENOUS

## 2016-07-16 MED ORDER — ONDANSETRON HCL 4 MG/2ML IJ SOLN
4.0000 mg | Freq: Four times a day (QID) | INTRAMUSCULAR | Status: DC | PRN
  Administered 2016-07-18 – 2016-07-19 (×4): 4 mg via INTRAVENOUS
  Filled 2016-07-16 (×4): qty 2

## 2016-07-16 MED ORDER — IOPAMIDOL (ISOVUE-300) INJECTION 61%
INTRAVENOUS | Status: AC
  Filled 2016-07-16: qty 30

## 2016-07-16 MED ORDER — ONDANSETRON 4 MG PO TBDP: 4.0000 mg | ORAL_TABLET | Freq: Four times a day (QID) | ORAL | Status: DC | PRN

## 2016-07-16 MED ORDER — PANTOPRAZOLE SODIUM 40 MG IV SOLR
40.0000 mg | Freq: Every day | INTRAVENOUS | Status: DC
  Administered 2016-07-17: 40 mg via INTRAVENOUS
  Filled 2016-07-16: qty 40

## 2016-07-16 MED ORDER — ONDANSETRON HCL 4 MG/2ML IJ SOLN
4.0000 mg | Freq: Once | INTRAMUSCULAR | Status: AC
  Administered 2016-07-16: 4 mg via INTRAVENOUS
  Filled 2016-07-16: qty 2

## 2016-07-16 MED ORDER — HYDROMORPHONE HCL 1 MG/ML IJ SOLN
1.0000 mg | INTRAMUSCULAR | Status: DC | PRN
Start: 2016-07-16 — End: 2016-07-20

## 2016-07-16 MED ORDER — PANTOPRAZOLE SODIUM 40 MG IV SOLR
40.0000 mg | Freq: Once | INTRAVENOUS | Status: AC
  Administered 2016-07-16: 40 mg via INTRAVENOUS
  Filled 2016-07-16: qty 40

## 2016-07-16 MED ORDER — SODIUM CHLORIDE 0.9 % IV BOLUS (SEPSIS)
1000.0000 mL | Freq: Once | INTRAVENOUS | Status: AC
  Administered 2016-07-16: 1000 mL via INTRAVENOUS

## 2016-07-16 MED ORDER — PROMETHAZINE HCL 25 MG/ML IJ SOLN
6.2500 mg | Freq: Once | INTRAMUSCULAR | Status: AC
  Administered 2016-07-16: 6.25 mg via INTRAVENOUS
  Filled 2016-07-16: qty 1

## 2016-07-16 MED ORDER — IOPAMIDOL (ISOVUE-300) INJECTION 61%: 30.0000 mL | Freq: Once | INTRAVENOUS | Status: DC | PRN

## 2016-07-16 MED ORDER — KCL IN DEXTROSE-NACL 20-5-0.45 MEQ/L-%-% IV SOLN
INTRAVENOUS | Status: DC
  Administered 2016-07-17: 03:00:00 via INTRAVENOUS
  Filled 2016-07-16: qty 1000

## 2016-07-16 MED ORDER — IOPAMIDOL (ISOVUE-300) INJECTION 61%
100.0000 mL | Freq: Once | INTRAVENOUS | Status: AC | PRN
  Administered 2016-07-16: 60 mL via INTRAVENOUS

## 2016-07-16 NOTE — H&P (Signed)
Ashley Howard is an 71 y.o. female.    General Surgery [facility]  Chief Complaint: small bowel obstruction  HPI: patient is a 72 yo WF brought to the ER by EMS with signs and symptoms of SBO.  Patient and husband found on floor in unheated home under a blanket.  Patient states she has had "constipation" for a long time.  Nausea and emesis began 3 days ago.  In ER, elevated WBC 17K, elevated lactate level.  CT scan with markedly distended stomach and dilated small bowel consistent with obstruction but no transition point seen.  Previous TAH and appy.  Hx of breast surgery and ray amputation of left foot.  On Neurontin for "neuropathy".  Past Medical History:  Diagnosis Date  . Arthritis   . Bowel incontinence   . Fluid retention in legs    wears compression stocking R leg  . Gallstones   . GERD (gastroesophageal reflux disease)   . H/O hiatal hernia   . Incontinence of urine    wears adult briefs  . PONV (postoperative nausea and vomiting)   . Scoliosis   . Seasonal allergies   . Shortness of breath    with pain  . Weight loss, unintentional    40 lbs in last year    Past Surgical History:  Procedure Laterality Date  . ABDOMINAL HYSTERECTOMY  1975  . adenoidectomy    . AMPUTATION  11/07/2011   Procedure: AMPUTATION RAY;  Surgeon: Nadara Mustard, MD;  Location: [facility] OR;  Service: Orthopedics;  Laterality: Left;  Left Foot 1st Ray Amputation  . APPENDECTOMY    . BLADDER REPAIR    . BREAST SURGERY  1989   lumpectomy bilat  . DOBUTAMINE STRESS ECHO    . EYE SURGERY     bilat cataracts  . KNEE ARTHROSCOPY     left  . TONSILLECTOMY      Family History  Problem Relation Age of Onset  . Hypertension Mother    Social History:  reports that she has been smoking Cigarettes.  She has been smoking about 1.00 pack per day. She has never used smokeless tobacco. She reports that she does not drink alcohol or use drugs.  Allergies:  Allergies  Allergen  Reactions  . Tape Other (See Comments)    Paper tape - blisters  . Tylenol [Acetaminophen] Other (See Comments)    Stop from urinating      (Not in a hospital admission)  Results for orders placed or performed during the hospital encounter of 07/16/16 (from the past 48 hour(s))  Comprehensive metabolic panel     Status: Abnormal   Collection Time: 07/16/16  4:44 PM  Result Value Ref Range   Sodium 141 135 - 145 mmol/L   Potassium 3.9 3.5 - 5.1 mmol/L   Chloride 95 (L) 101 - 111 mmol/L   CO2 35 (H) 22 - 32 mmol/L   Glucose, Bld 121 (H) 65 - 99 mg/dL   BUN 53 (H) 6 - 20 mg/dL   Creatinine, Ser 3.47 (H) 0.44 - 1.00 mg/dL   Calcium 8.6 (L) 8.9 - 10.3 mg/dL   Total Protein 5.6 (L) 6.5 - 8.1 g/dL   Albumin 2.8 (L) 3.5 - 5.0 g/dL   AST 26 15 - 41 U/L   ALT 14 14 - 54 U/L   Alkaline Phosphatase 61 38 - 126 U/L   Total Bilirubin 0.9 0.3 - 1.2 mg/dL   GFR calc non Af Denyse Dago  54 (L) >60 mL/min   GFR calc Af Amer >60 >60 mL/min    Comment: (NOTE) The eGFR has been calculated using the CKD EPI equation. This calculation has not been validated in all clinical situations. eGFR's persistently <60 mL/min signify possible Chronic Kidney Disease.    Anion gap 11 5 - 15  CBC with Differential     Status: Abnormal   Collection Time: 07/16/16  4:44 PM  Result Value Ref Range   WBC 17.2 (H) 4.0 - 10.5 K/uL   RBC 2.49 (L) 3.87 - 5.11 MIL/uL   Hemoglobin 8.7 (L) 12.0 - 15.0 g/dL   HCT 13.5 (L) 32.9 - 79.6 %   MCV 98.0 78.0 - 100.0 fL   MCH 34.9 (H) 26.0 - 34.0 pg   MCHC 35.7 30.0 - 36.0 g/dL   RDW 94.2 16.6 - 68.1 %   Platelets 315 150 - 400 K/uL   Neutrophils Relative % 91 %   Neutro Abs 15.6 (H) 1.7 - 7.7 K/uL   Lymphocytes Relative 4 %   Lymphs Abs 0.7 0.7 - 4.0 K/uL   Monocytes Relative 5 %   Monocytes Absolute 0.8 0.1 - 1.0 K/uL   Eosinophils Relative 0 %   Eosinophils Absolute 0.0 0.0 - 0.7 K/uL   Basophils Relative 0 %   Basophils Absolute 0.0 0.0 - 0.1 K/uL  Protime-INR      Status: None   Collection Time: 07/16/16  4:44 PM  Result Value Ref Range   Prothrombin Time 13.9 11.4 - 15.2 seconds   INR 1.06   Ethanol     Status: None   Collection Time: 07/16/16  4:44 PM  Result Value Ref Range   Alcohol, Ethyl (B) <5 <5 mg/dL    Comment:        LOWEST DETECTABLE LIMIT FOR SERUM ALCOHOL IS 5 mg/dL FOR MEDICAL PURPOSES ONLY   Sample to Blood Bank     Status: None   Collection Time: 07/16/16  4:52 PM  Result Value Ref Range   Blood Bank Specimen SAMPLE AVAILABLE FOR TESTING    Sample Expiration 07/19/2016   I-Stat CG4 Lactic Acid, ED     Status: Abnormal   Collection Time: 07/16/16  6:17 PM  Result Value Ref Range   Lactic Acid, Venous 2.34 (HH) 0.5 - 1.9 mmol/L   Comment NOTIFIED PHYSICIAN   POC occult blood, ED RN will collect     Status: None   Collection Time: 07/16/16  6:21 PM  Result Value Ref Range   Fecal Occult Bld NEGATIVE NEGATIVE  Urinalysis, Routine w reflex microscopic     Status: Abnormal   Collection Time: 07/16/16  8:00 PM  Result Value Ref Range   Color, Urine YELLOW YELLOW   APPearance HAZY (A) CLEAR   Specific Gravity, Urine 1.017 1.005 - 1.030   pH 5.0 5.0 - 8.0   Glucose, UA NEGATIVE NEGATIVE mg/dL   Hgb urine dipstick NEGATIVE NEGATIVE   Bilirubin Urine NEGATIVE NEGATIVE   Ketones, ur NEGATIVE NEGATIVE mg/dL   Protein, ur NEGATIVE NEGATIVE mg/dL   Nitrite NEGATIVE NEGATIVE   Leukocytes, UA NEGATIVE NEGATIVE   Ct Abdomen Pelvis W Contrast  Result Date: 07/16/2016 CLINICAL DATA:  Throwing up dark tarry emesis x1 week. Generalized abdominal pain. EXAM: CT ABDOMEN AND PELVIS WITH CONTRAST TECHNIQUE: Multidetector CT imaging of the abdomen and pelvis was performed using the standard protocol following bolus administration of intravenous contrast. CONTRAST:  62mL ISOVUE-300 IOPAMIDOL (ISOVUE-300) INJECTION 61% COMPARISON:  CT  from 06/27/2016 FINDINGS: Lower chest: No acute abnormality. Fluid-filled distal esophagus likely secondary to  reflux from marked gastric fluid-filled distention and small-bowel obstruction. Hepatobiliary: Mild intrahepatic ductal dilatation. There are least 2 gallstones are again identified without significant change measuring up to 9 mm in greatest dimension. No gallbladder wall thickening or distention. No space-occupying mass of the liver. Pancreas: Poorly visualized due to adjacent marked fluid-filled small bowel dilatation. Spleen: No splenomegaly. Adrenals/Urinary Tract: Adrenal glands are unremarkable. Kidneys are normal, without renal calculi, focal lesion, or hydronephrosis. Bladder is partially decompressed. Stomach/Bowel: Marked fluid-filled gastric distention with marked small bowel fluid-filled dilatation up to 4.7 cm. Decompressed large bowel. Findings are in keeping with high-grade small bowel obstruction. Exact source and transition zone is not identified the given history of prior hysterectomy and appendectomy as well as bladder repair, adhesions is a leading consideration. Vascular/Lymphatic: Extensive atherosclerotic calcifications of the abdominal aorta and iliac arteries. No lymphadenopathy. Reproductive: Uterus and adnexa are not well visualized. Other: There is a small volume of ascites interposed between dilated small bowel loops. Musculoskeletal: There shaped scoliosis of the thoracolumbar spine. IMPRESSION: Worsening of small bowel obstruction since prior exam where it was noted mostly within the pelvis. There is marked gastric fluid-filled distention and reflux of contrast into the distal esophagus. Fluid-filled small bowel measures up to 4.7 cm in diameter. NG tube to suction is highly recommended given the potential risk of aspiration due to fluid-filled stomach and reflux. Critical Value/emergent results were called by telephone at the time of interpretation on 07/16/2016 at 9:08 pm to Dr. Davonna Belling , who verbally acknowledged these results. Uncomplicated cholelithiasis. Electronically  Signed   By: Ashley Royalty M.D.   On: 07/16/2016 21:08   Dg Abd Acute W/chest  Result Date: 07/16/2016 CLINICAL DATA:  Vomiting for 1 week, generalized abdominal pain. EXAM: DG ABDOMEN ACUTE W/ 1V CHEST COMPARISON:  Chest x-ray dated 06/27/2016 and abdomen CT dated 06/27/2016. FINDINGS: Single view of the chest and two views of the abdomen are provided. Single view of the chest: Cardiomediastinal silhouette is stable. Atherosclerotic changes noted at the aortic arch. Lungs are at least mildly hyperexpanded suggesting COPD/ emphysema. Lungs appear clear. No pleural effusion or pneumothorax seen. Supine and decubitus views of the abdomen: Significantly distended bowel loops, most likely small bowel, are seen within the left abdomen and right pelvis, similar to the small-bowel distention seen on earlier CT abdomen of 06/27/2016. Air and stool is noted within the right colon. There is no evidence of free intraperitoneal air. No soft tissue mass or abnormal fluid collection seen. Degenerative changes noted within the scoliotic lumbar spine. No acute or suspicious osseous finding seen. IMPRESSION: 1. No evidence of acute cardiopulmonary abnormality. No evidence of pneumonia. 2. Distended loops of gas-filled bowel within the left abdomen and right pelvis, favored to be small bowel loops, similar to the small bowel distension seen on earlier CT abdomen of 06/27/2016. Chronic partial obstruction or ileus? Chronic institutional bowel syndrome? 3. Aortic atherosclerosis. Electronically Signed   By: Franki Cabot M.D.   On: 07/16/2016 17:50    Review of Systems  Constitutional: Positive for malaise/fatigue and weight loss.  HENT: Positive for nosebleeds.   Eyes: Negative.   Respiratory: Positive for cough.   Cardiovascular: Positive for leg swelling.  Gastrointestinal: Positive for constipation, nausea and vomiting. Negative for blood in stool.  Genitourinary: Negative.   Musculoskeletal: Negative.   Skin:  Negative.   Neurological: Positive for weakness.  Endo/Heme/Allergies: Negative.   Psychiatric/Behavioral:  Negative.     Blood pressure (!) 90/52, pulse 90, temperature 98.5 F (36.9 C), temperature source Rectal, resp. rate 14, height '4\' 11"'$  (1.499 m), weight 43.1 kg (95 lb), SpO2 97 %. Physical Exam  Constitutional: No distress.  Cachetic appearing  HENT:  Head: Normocephalic and atraumatic.  Right Ear: External ear normal.  Left Ear: External ear normal.  Eyes: Conjunctivae are normal. Pupils are equal, round, and reactive to light. No scleral icterus.  Neck: Normal range of motion. Neck supple. No tracheal deviation present. No thyromegaly present.  Cardiovascular: Normal rate, regular rhythm and normal heart sounds.   No murmur heard. Respiratory: Effort normal and breath sounds normal. No respiratory distress. She has no wheezes.  Coarse BS bilat  GI: She exhibits distension (marked). She exhibits no mass. There is tenderness (mild, diffuse). There is guarding. There is no rebound.  Musculoskeletal: Normal range of motion. She exhibits deformity (ray amputation left foot). She exhibits no edema or tenderness.  Neurological: She is alert.  Skin: Skin is warm and dry. She is not diaphoretic.  Psychiatric: She has a normal mood and affect.     Assessment/Plan Small bowel obstruction, likely secondary to adhesions Hypotension Dehydration  Admit to surgical service.  Will request step-down bed due to mild hypotension and alteration in mental status.  Plan NG decompression, IV hydration, repeat labs in AM, serial exams.  Discussed with patient who agrees.  Earnstine Regal, MD, [facility] Office: [phone]    Earnstine Regal, MD 07/16/2016, 10:23 PM

## 2016-07-16 NOTE — ED Provider Notes (Signed)
[facility] DEPT Provider Note   CSN: [id number] Arrival date & time: 07/16/16  1607     History   Chief Complaint Chief Complaint  Patient presents with  . Abdominal Pain  . Hematemesis    HPI Ashley Howard is a 71 y.o. female.  HPI Patient presents with throwing of emesis that is black the last week. Also abdominal pain. Reportedly had a welfare check called from an occupational therapist. Reportedly had no heat in the house. Patient's abdominal pain. Denies fevers. States she is having no bowel movements. Pain is dull and constant. Patient states her abdomen is not normally swollen like it is now. Around 2 weeks ago was on antibiotics for urinary tract infection. Past Medical History:  Diagnosis Date  . Arthritis   . Bowel incontinence   . Fluid retention in legs    wears compression stocking R leg  . Gallstones   . GERD (gastroesophageal reflux disease)   . H/O hiatal hernia   . Incontinence of urine    wears adult briefs  . PONV (postoperative nausea and vomiting)   . Scoliosis   . Seasonal allergies   . Shortness of breath    with pain  . Weight loss, unintentional    40 lbs in last year    Patient Active Problem List   Diagnosis Date Noted  . Abnormality of gait 01/04/2013  . Pain of left great toe 01/04/2013    Past Surgical History:  Procedure Laterality Date  . ABDOMINAL HYSTERECTOMY  1975  . adenoidectomy    . AMPUTATION  11/07/2011   Procedure: AMPUTATION RAY;  Surgeon: Newt Minion, MD;  Location: [facility];  Service: Orthopedics;  Laterality: Left;  Left Foot 1st Ray Amputation  . APPENDECTOMY    . BLADDER REPAIR    . BREAST SURGERY  1989   lumpectomy bilat  . DOBUTAMINE STRESS ECHO    . EYE SURGERY     bilat cataracts  . KNEE ARTHROSCOPY     left  . TONSILLECTOMY      OB History    No data available       Home Medications    Prior to Admission medications   Medication Sig Start Date End Date Taking? Authorizing Provider    calcium carbonate (TUMS - DOSED IN MG ELEMENTAL CALCIUM) 500 MG chewable tablet Chew 1 tablet by mouth as needed for indigestion or heartburn.   Yes Historical Provider, MD  diphenhydrAMINE (BENADRYL) 25 MG tablet Take 25 mg by mouth every 6 (six) hours as needed for allergies.   Yes Historical Provider, MD  Docusate Calcium (STOOL SOFTENER PO) Take 1 tablet by mouth daily.   Yes Historical Provider, MD  ENSURE PLUS (ENSURE PLUS) LIQD Take 237 mLs by mouth daily.   Yes Historical Provider, MD  gabapentin (NEURONTIN) 300 MG capsule Take 300 mg by mouth 3 (three) times daily.  03/04/14  Yes Historical Provider, MD  methocarbamol (ROBAXIN) 500 MG tablet Take 500 mg by mouth daily.    Yes Historical Provider, MD  MYRBETRIQ 25 MG TB24 tablet Take 25 mg by mouth 2 (two) times daily.  03/04/14  Yes Historical Provider, MD  naproxen sodium (ANAPROX) 220 MG tablet Take 440 mg by mouth 2 (two) times daily as needed (pain).    Yes Historical Provider, MD  pravastatin (PRAVACHOL) 20 MG tablet Take 20 mg by mouth daily.  09/05/12  Yes Historical Provider, MD  VESICARE 10 MG tablet Take 10 mg by mouth  at bedtime.    Yes Historical Provider, MD  ipratropium (ATROVENT) 0.06 % nasal spray Place 2 sprays into both nostrils 4 (four) times daily. Patient not taking: Reported on 07/16/2016 05/07/16   Billy Fischer, MD  levofloxacin (LEVAQUIN) 500 MG tablet Take 1 tablet (500 mg total) by mouth daily. Patient not taking: Reported on 07/16/2016 06/27/16   Milton Ferguson, MD    Family History Family History  Problem Relation Age of Onset  . Hypertension Mother     Social History Social History  Substance Use Topics  . Smoking status: Current Some Day Smoker    Packs/day: 1.00    Types: Cigarettes  . Smokeless tobacco: Never Used  . Alcohol use No     Allergies   Tape and Tylenol [acetaminophen]   Review of Systems Review of Systems  Constitutional: Positive for appetite change. Negative for fever.  HENT:  Negative for congestion.   Respiratory: Negative for shortness of breath.   Cardiovascular: Negative for chest pain.  Gastrointestinal: Positive for abdominal distention, abdominal pain, nausea and vomiting. Negative for blood in stool and constipation.  Genitourinary: Negative for dysuria.  Musculoskeletal: Negative for back pain.  Skin: Negative for wound.  Neurological: Negative for headaches.  Psychiatric/Behavioral: Negative for confusion.     Physical Exam Updated Vital Signs BP (!) 90/52 (BP Location: Left Arm)   Pulse 90   Temp 98.5 F (36.9 C) (Rectal)   Resp 14   Ht 4\' 11"  (1.499 m)   Wt 95 lb (43.1 kg)   SpO2 97%   BMI 19.19 kg/m   Physical Exam  Constitutional: She appears well-developed.  HENT:  Head: Normocephalic.  Eyes: EOM are normal.  Neck: Neck supple.  Cardiovascular: Normal rate.   Pulmonary/Chest: Effort normal.  Abdominal: She exhibits distension. There is tenderness.  Diffusely distended and tender. No hernias palpated.  Musculoskeletal: She exhibits no tenderness.  Neurological: She is alert.  Skin: Skin is warm. Capillary refill takes less than 2 seconds.  Psychiatric: She has a normal mood and affect.     ED Treatments / Results  Labs (all labs ordered are listed, but only abnormal results are displayed) Labs Reviewed  COMPREHENSIVE METABOLIC PANEL - Abnormal; Notable for the following:       Result Value   Chloride 95 (*)    CO2 35 (*)    Glucose, Bld 121 (*)    BUN 53 (*)    Creatinine, Ser 1.02 (*)    Calcium 8.6 (*)    Total Protein 5.6 (*)    Albumin 2.8 (*)    GFR calc non Af Amer 54 (*)    All other components within normal limits  CBC WITH DIFFERENTIAL/PLATELET - Abnormal; Notable for the following:    WBC 17.2 (*)    RBC 2.49 (*)    Hemoglobin 8.7 (*)    HCT 24.4 (*)    MCH 34.9 (*)    Neutro Abs 15.6 (*)    All other components within normal limits  URINALYSIS, ROUTINE W REFLEX MICROSCOPIC - Abnormal; Notable for  the following:    APPearance HAZY (*)    All other components within normal limits  I-STAT CG4 LACTIC ACID, ED - Abnormal; Notable for the following:    Lactic Acid, Venous 2.34 (*)    All other components within normal limits  PROTIME-INR  ETHANOL  POC OCCULT BLOOD, ED  SAMPLE TO BLOOD BANK    EKG  EKG Interpretation None  Radiology Ct Abdomen Pelvis W Contrast  Result Date: 07/16/2016 CLINICAL DATA:  Throwing up dark tarry emesis x1 week. Generalized abdominal pain. EXAM: CT ABDOMEN AND PELVIS WITH CONTRAST TECHNIQUE: Multidetector CT imaging of the abdomen and pelvis was performed using the standard protocol following bolus administration of intravenous contrast. CONTRAST:  3mL ISOVUE-300 IOPAMIDOL (ISOVUE-300) INJECTION 61% COMPARISON:  CT from 06/27/2016 FINDINGS: Lower chest: No acute abnormality. Fluid-filled distal esophagus likely secondary to reflux from marked gastric fluid-filled distention and small-bowel obstruction. Hepatobiliary: Mild intrahepatic ductal dilatation. There are least 2 gallstones are again identified without significant change measuring up to 9 mm in greatest dimension. No gallbladder wall thickening or distention. No space-occupying mass of the liver. Pancreas: Poorly visualized due to adjacent marked fluid-filled small bowel dilatation. Spleen: No splenomegaly. Adrenals/Urinary Tract: Adrenal glands are unremarkable. Kidneys are normal, without renal calculi, focal lesion, or hydronephrosis. Bladder is partially decompressed. Stomach/Bowel: Marked fluid-filled gastric distention with marked small bowel fluid-filled dilatation up to 4.7 cm. Decompressed large bowel. Findings are in keeping with high-grade small bowel obstruction. Exact source and transition zone is not identified the given history of prior hysterectomy and appendectomy as well as bladder repair, adhesions is a leading consideration. Vascular/Lymphatic: Extensive atherosclerotic  calcifications of the abdominal aorta and iliac arteries. No lymphadenopathy. Reproductive: Uterus and adnexa are not well visualized. Other: There is a small volume of ascites interposed between dilated small bowel loops. Musculoskeletal: There shaped scoliosis of the thoracolumbar spine. IMPRESSION: Worsening of small bowel obstruction since prior exam where it was noted mostly within the pelvis. There is marked gastric fluid-filled distention and reflux of contrast into the distal esophagus. Fluid-filled small bowel measures up to 4.7 cm in diameter. NG tube to suction is highly recommended given the potential risk of aspiration due to fluid-filled stomach and reflux. Critical Value/emergent results were called by telephone at the time of interpretation on 07/16/2016 at 9:08 pm to Dr. Davonna Belling , who verbally acknowledged these results. Uncomplicated cholelithiasis. Electronically Signed   By: Ashley Royalty M.D.   On: 07/16/2016 21:08   Dg Abd Acute W/chest  Result Date: 07/16/2016 CLINICAL DATA:  Vomiting for 1 week, generalized abdominal pain. EXAM: DG ABDOMEN ACUTE W/ 1V CHEST COMPARISON:  Chest x-ray dated 06/27/2016 and abdomen CT dated 06/27/2016. FINDINGS: Single view of the chest and two views of the abdomen are provided. Single view of the chest: Cardiomediastinal silhouette is stable. Atherosclerotic changes noted at the aortic arch. Lungs are at least mildly hyperexpanded suggesting COPD/ emphysema. Lungs appear clear. No pleural effusion or pneumothorax seen. Supine and decubitus views of the abdomen: Significantly distended bowel loops, most likely small bowel, are seen within the left abdomen and right pelvis, similar to the small-bowel distention seen on earlier CT abdomen of 06/27/2016. Air and stool is noted within the right colon. There is no evidence of free intraperitoneal air. No soft tissue mass or abnormal fluid collection seen. Degenerative changes noted within the scoliotic lumbar  spine. No acute or suspicious osseous finding seen. IMPRESSION: 1. No evidence of acute cardiopulmonary abnormality. No evidence of pneumonia. 2. Distended loops of gas-filled bowel within the left abdomen and right pelvis, favored to be small bowel loops, similar to the small bowel distension seen on earlier CT abdomen of 06/27/2016. Chronic partial obstruction or ileus? Chronic institutional bowel syndrome? 3. Aortic atherosclerosis. Electronically Signed   By: Franki Cabot M.D.   On: 07/16/2016 17:50    Procedures Procedures (including critical care time)  Medications  Ordered in ED Medications  iopamidol (ISOVUE-300) 61 % injection (not administered)  iopamidol (ISOVUE-300) 61 % injection (not administered)  iopamidol (ISOVUE-300) 61 % injection 30 mL (not administered)  sodium chloride 0.9 % bolus 1,000 mL (1,000 mLs Intravenous New Bag/Given 07/16/16 2148)  sodium chloride 0.9 % bolus 1,000 mL (0 mLs Intravenous Stopped 07/16/16 1913)  ondansetron (ZOFRAN) injection 4 mg (4 mg Intravenous Given 07/16/16 1814)  pantoprazole (PROTONIX) injection 40 mg (40 mg Intravenous Given 07/16/16 1913)  sodium chloride 0.9 % bolus 500 mL (500 mLs Intravenous New Bag/Given 07/16/16 2032)  promethazine (PHENERGAN) injection 6.25 mg (6.25 mg Intravenous Given 07/16/16 2032)  iopamidol (ISOVUE-300) 61 % injection 100 mL (60 mLs Intravenous Contrast Given 07/16/16 2008)     Initial Impression / Assessment and Plan / ED Course  I have reviewed the triage vital signs and the nursing notes.  Pertinent labs & imaging results that were available during my care of the patient were reviewed by me and considered in my medical decision making (see chart for details).  Clinical Course     Patient presents with nausea vomiting abdominal pain. Distended abdomen. Reviewed records from previous ER visit 2 weeks ago and had some abdominal distention on CT. Now has clear bowel obstruction. Has been vomiting. States she's been  passing nothing from her bowels. Has some mild hypotension which appears to be somewhat chronic for. Lactic acid minimally elevated.White count elevated and hemoglobin is a little low. Guaiac negative from below. Will require admission. NG tube will be placed. Patient vomited had a little Phenergan has been a little more sleepy since then. General surgery will admit  CRITICAL CARE Performed by: Mackie Pai. Total critical care time: 30 minutes Critical care time was exclusive of separately billable procedures and treating other patients. Critical care was necessary to treat or prevent imminent or life-threatening deterioration. Critical care was time spent personally by me on the following activities: development of treatment plan with patient and/or surrogate as well as nursing, discussions with consultants, evaluation of patient's response to treatment, examination of patient, obtaining history from patient or surrogate, ordering and performing treatments and interventions, ordering and review of laboratory studies, ordering and review of radiographic studies, pulse oximetry and re-evaluation of patient's condition.   Final Clinical Impressions(s) / ED Diagnoses   Final diagnoses:  Small bowel obstruction  Anemia, unspecified type    New Prescriptions New Prescriptions   No medications on file     Davonna Belling, MD 07/16/16 2220
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
## 2016-07-16 NOTE — ED Notes (Signed)
Bed: RESA Expected date:  Expected time:  Means of arrival:  Comments: EMS 71 yo, abd pain

## 2016-07-16 NOTE — ED Notes (Signed)
Attempted to call for report, RN will call back.
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
## 2016-07-16 NOTE — ED Notes (Signed)
Patient transported to CT 
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
## 2016-07-16 NOTE — ED Notes (Signed)
[facility] report made concerning patient's home situation

## 2016-07-16 NOTE — ED Notes (Signed)
Patient's husband yelling and screaming at staff to give him his medication, wants to be taken out for air, wants to be taken upstairs so he can lay in a bed. Patient became irate and called staff "a bunch of dumb asses." Attempted to explain to the patient;s husband that he was not a patient and could not be taken to a bed nor could give him medication.

## 2016-07-16 NOTE — ED Notes (Signed)
Abnormal labs result MD Alvino Chapel have been made aware

## 2016-07-16 NOTE — ED Notes (Signed)
Voicemails left with [facility] and [facility] social workers about patient and husband's home situation

## 2016-07-17 ENCOUNTER — Inpatient Hospital Stay (HOSPITAL_COMMUNITY): Payer: Medicare Other

## 2016-07-17 DIAGNOSIS — A419 Sepsis, unspecified organism: Principal | ICD-10-CM

## 2016-07-17 DIAGNOSIS — J690 Pneumonitis due to inhalation of food and vomit: Secondary | ICD-10-CM

## 2016-07-17 DIAGNOSIS — R6521 Severe sepsis with septic shock: Secondary | ICD-10-CM

## 2016-07-17 DIAGNOSIS — K56609 Unspecified intestinal obstruction, unspecified as to partial versus complete obstruction: Secondary | ICD-10-CM

## 2016-07-17 LAB — PREALBUMIN: Prealbumin: 7.8 mg/dL — ABNORMAL LOW (ref 18–38)

## 2016-07-17 LAB — CBC
HCT: 20 % — ABNORMAL LOW (ref 36.0–46.0)
HEMATOCRIT: 17.2 % — AB (ref 36.0–46.0)
HEMATOCRIT: 26.4 % — AB (ref 36.0–46.0)
HEMOGLOBIN: 9.2 g/dL — AB (ref 12.0–15.0)
Hemoglobin: 7.1 g/dL — ABNORMAL LOW (ref 12.0–15.0)
Hemoglobin: 5.9 g/dL — CL (ref 12.0–15.0)
MCH: 35.5 pg — ABNORMAL HIGH (ref 26.0–34.0)
MCH: 33.7 pg (ref 26.0–34.0)
MCH: 33.1 pg (ref 26.0–34.0)
MCHC: 35.5 g/dL (ref 30.0–36.0)
MCHC: 34.3 g/dL (ref 30.0–36.0)
MCHC: 34.8 g/dL (ref 30.0–36.0)
MCV: 100 fL (ref 78.0–100.0)
MCV: 98.3 fL (ref 78.0–100.0)
MCV: 95 fL (ref 78.0–100.0)
PLATELETS: 235 10*3/uL (ref 150–400)
Platelets: 192 10*3/uL (ref 150–400)
Platelets: 217 10*3/uL (ref 150–400)
RBC: 2 MIL/uL — AB (ref 3.87–5.11)
RBC: 1.75 MIL/uL — ABNORMAL LOW (ref 3.87–5.11)
RBC: 2.78 MIL/uL — AB (ref 3.87–5.11)
RDW: 14.2 % (ref 11.5–15.5)
RDW: 14.4 % (ref 11.5–15.5)
RDW: 16.5 % — ABNORMAL HIGH (ref 11.5–15.5)
WBC: 7.1 10*3/uL (ref 4.0–10.5)
WBC: 6.6 10*3/uL (ref 4.0–10.5)
WBC: 9.2 10*3/uL (ref 4.0–10.5)

## 2016-07-17 LAB — BASIC METABOLIC PANEL
ANION GAP: 5 (ref 5–15)
Anion gap: 5 (ref 5–15)
BUN: 36 mg/dL — AB (ref 6–20)
BUN: 32 mg/dL — ABNORMAL HIGH (ref 6–20)
CALCIUM: 7.2 mg/dL — AB (ref 8.9–10.3)
CALCIUM: 7 mg/dL — AB (ref 8.9–10.3)
CO2: 29 mmol/L (ref 22–32)
CO2: 27 mmol/L (ref 22–32)
CREATININE: 0.73 mg/dL (ref 0.44–1.00)
Chloride: 108 mmol/L (ref 101–111)
Chloride: 110 mmol/L (ref 101–111)
Creatinine, Ser: 0.74 mg/dL (ref 0.44–1.00)
GFR calc non Af Amer: >60 mL/min (ref >60)
GFR calc non Af Amer: >60 mL/min (ref >60)
Glucose, Bld: 103 mg/dL — ABNORMAL HIGH (ref 65–99)
Glucose, Bld: 91 mg/dL (ref 65–99)
Potassium: 3 mmol/L — ABNORMAL LOW (ref 3.5–5.1)
Potassium: 3.1 mmol/L — ABNORMAL LOW (ref 3.5–5.1)
SODIUM: 142 mmol/L (ref 135–145)
Sodium: 142 mmol/L (ref 135–145)

## 2016-07-17 LAB — MRSA PCR SCREENING: MRSA BY PCR: NEGATIVE

## 2016-07-17 LAB — TROPONIN I
TROPONIN I: 0.04 ng/mL — AB (ref <0.03)
Troponin I: 0.04 ng/mL (ref <0.03)

## 2016-07-17 LAB — OCCULT BLOOD GASTRIC / DUODENUM (SPECIMEN CUP)
OCCULT BLOOD, GASTRIC: POSITIVE — AB
pH, Gastric: 3

## 2016-07-17 LAB — CORTISOL: Cortisol, Plasma: 19.3 ug/dL

## 2016-07-17 LAB — BRAIN NATRIURETIC PEPTIDE: B NATRIURETIC PEPTIDE 5: 429.8 pg/mL — AB (ref 0.0–100.0)

## 2016-07-17 LAB — LACTIC ACID, PLASMA
LACTIC ACID, VENOUS: 1.1 mmol/L (ref 0.5–1.9)
LACTIC ACID, VENOUS: 1.2 mmol/L (ref 0.5–1.9)

## 2016-07-17 LAB — ABO/RH: ABO/RH(D): O POS

## 2016-07-17 LAB — PROCALCITONIN: Procalcitonin: 0.14 ng/mL

## 2016-07-17 LAB — INFLUENZA PANEL BY PCR (TYPE A & B)
INFLBPCR: NEGATIVE
Influenza A By PCR: NEGATIVE

## 2016-07-17 LAB — PREPARE RBC (CROSSMATCH)

## 2016-07-17 MED ORDER — PIPERACILLIN-TAZOBACTAM 3.375 G IVPB
3.3750 g | Freq: Three times a day (TID) | INTRAVENOUS | Status: AC
  Administered 2016-07-17 – 2016-07-22 (×17): 3.375 g via INTRAVENOUS
  Filled 2016-07-17 (×17): qty 50

## 2016-07-17 MED ORDER — SODIUM CHLORIDE 0.9 % IV SOLN: Freq: Once | INTRAVENOUS | Status: DC

## 2016-07-17 MED ORDER — POTASSIUM CHLORIDE 10 MEQ/100ML IV SOLN
10.0000 meq | INTRAVENOUS | Status: AC
  Administered 2016-07-17 (×5): 10 meq via INTRAVENOUS
  Filled 2016-07-17 (×5): qty 100

## 2016-07-17 MED ORDER — PANTOPRAZOLE SODIUM 40 MG IV SOLR
40.0000 mg | Freq: Two times a day (BID) | INTRAVENOUS | Status: DC
  Administered 2016-07-17 – 2016-07-22 (×11): 40 mg via INTRAVENOUS
  Filled 2016-07-17 (×12): qty 40

## 2016-07-17 MED ORDER — ACETAMINOPHEN 10 MG/ML IV SOLN
1000.0000 mg | Freq: Four times a day (QID) | INTRAVENOUS | Status: DC
  Filled 2016-07-17: qty 100

## 2016-07-17 MED ORDER — DIPHENHYDRAMINE HCL 50 MG/ML IJ SOLN
12.5000 mg | Freq: Four times a day (QID) | INTRAMUSCULAR | Status: DC | PRN
  Administered 2016-07-18: 12.5 mg via INTRAVENOUS
  Administered 2016-07-18: 25 mg via INTRAVENOUS
  Filled 2016-07-17 (×2): qty 1

## 2016-07-17 MED ORDER — SODIUM CHLORIDE 0.9 % IV SOLN
Freq: Once | INTRAVENOUS | Status: AC
  Administered 2016-07-17: 06:00:00 via INTRAVENOUS

## 2016-07-17 MED ORDER — DIATRIZOATE MEGLUMINE & SODIUM 66-10 % PO SOLN
90.0000 mL | Freq: Once | ORAL | Status: AC
Start: 2016-07-17 — End: 2016-07-18
  Administered 2016-07-18: 90 mL via NASOGASTRIC
  Filled 2016-07-17: qty 90

## 2016-07-17 MED ORDER — SODIUM CHLORIDE 0.9 % IV SOLN: 30.0000 meq | Freq: Four times a day (QID) | INTRAVENOUS | Status: DC

## 2016-07-17 MED ORDER — POTASSIUM CHLORIDE 10 MEQ/100ML IV SOLN
10.0000 meq | INTRAVENOUS | Status: AC
  Administered 2016-07-17 (×3): 10 meq via INTRAVENOUS
  Filled 2016-07-17 (×3): qty 100

## 2016-07-17 MED ORDER — VANCOMYCIN HCL IN DEXTROSE 1-5 GM/200ML-% IV SOLN
1000.0000 mg | INTRAVENOUS | Status: DC
  Administered 2016-07-17 – 2016-07-18 (×2): 1000 mg via INTRAVENOUS
  Filled 2016-07-17 (×2): qty 200

## 2016-07-17 MED ORDER — KCL IN DEXTROSE-NACL 20-5-0.9 MEQ/L-%-% IV SOLN
INTRAVENOUS | Status: DC
  Administered 2016-07-17: 13:00:00 via INTRAVENOUS
  Filled 2016-07-17: qty 1000

## 2016-07-17 MED ORDER — VITAMINS A & D EX OINT
TOPICAL_OINTMENT | CUTANEOUS | Status: AC
  Administered 2016-07-17: 5
  Filled 2016-07-17: qty 5

## 2016-07-17 MED ORDER — DIPHENHYDRAMINE HCL 50 MG/ML IJ SOLN
12.5000 mg | Freq: Once | INTRAMUSCULAR | Status: AC
  Administered 2016-07-17: 12.5 mg via INTRAVENOUS
  Filled 2016-07-17: qty 1

## 2016-07-17 MED ORDER — DEXTROSE 5 % IV SOLN
30.0000 ug/min | INTRAVENOUS | Status: DC
  Administered 2016-07-17: 108 ug/min via INTRAVENOUS
  Administered 2016-07-17: 30 ug/min via INTRAVENOUS
  Administered 2016-07-17: 120 ug/min via INTRAVENOUS
  Administered 2016-07-17: 115 ug/min via INTRAVENOUS
  Administered 2016-07-17: 105 ug/min via INTRAVENOUS
  Administered 2016-07-17: 90 ug/min via INTRAVENOUS
  Administered 2016-07-17: 103 ug/min via INTRAVENOUS
  Administered 2016-07-18: 85 ug/min via INTRAVENOUS
  Administered 2016-07-18: 103 ug/min via INTRAVENOUS
  Administered 2016-07-18: 22 ug/min via INTRAVENOUS
  Administered 2016-07-18: 50 ug/min via INTRAVENOUS
  Administered 2016-07-18: 105 ug/min via INTRAVENOUS
  Administered 2016-07-19: 16 ug/min via INTRAVENOUS
  Administered 2016-07-20 – 2016-07-21 (×2): 10 ug/min via INTRAVENOUS
  Filled 2016-07-17 (×17): qty 1

## 2016-07-17 MED ORDER — SODIUM CHLORIDE 0.9 % IV SOLN
Freq: Once | INTRAVENOUS | Status: AC
  Administered 2016-07-17: 02:00:00 via INTRAVENOUS

## 2016-07-17 MED ORDER — ACETAMINOPHEN 10 MG/ML IV SOLN
1000.0000 mg | Freq: Four times a day (QID) | INTRAVENOUS | Status: AC
  Administered 2016-07-17 – 2016-07-18 (×4): 1000 mg via INTRAVENOUS
  Filled 2016-07-17 (×5): qty 100

## 2016-07-17 MED ORDER — ORAL CARE MOUTH RINSE
15.0000 mL | Freq: Two times a day (BID) | OROMUCOSAL | Status: DC
  Administered 2016-07-17 – 2016-07-23 (×11): 15 mL via OROMUCOSAL

## 2016-07-17 MED ORDER — CHLORHEXIDINE GLUCONATE 0.12 % MT SOLN
15.0000 mL | Freq: Two times a day (BID) | OROMUCOSAL | Status: DC
  Administered 2016-07-17 – 2016-07-24 (×15): 15 mL via OROMUCOSAL
  Filled 2016-07-17 (×14): qty 15

## 2016-07-17 NOTE — Progress Notes (Signed)
Initial Nutrition Assessment  DOCUMENTATION CODES:   Not applicable  INTERVENTION:  - RD will follow-up 1/5.  NUTRITION DIAGNOSIS:   Inadequate oral intake related to inability to eat as evidenced by NPO status.  GOAL:   Patient will meet greater than or equal to 90% of their needs  MONITOR:   Weight trends, Labs, I & O's, Other (Comment) (Diet advancement versus need for nutrition support)  REASON FOR ASSESSMENT:   Malnutrition Screening Tool  ASSESSMENT:   71 yo WF brought to the ER by EMS with signs and symptoms of SBO.  Patient and husband found on floor in unheated home under a blanket.  Patient states she has had "constipation" for a long time.  Nausea and emesis began 3 days ago.  In ER, elevated WBC 17K, elevated lactate level.  CT scan with markedly distended stomach and dilated small bowel consistent with obstruction but no transition point seen.  Pt seen for MST. BMI indicates normal weight. Pt has been NPO since admission and unable to meet needs. NGT placed in ED yesterday.  Per chart review, pt had 3-4 weeks of anorexia followed by 2-3 days of N/V with associated mild abdominal pain. Emesis is described as black tarry liquid.   Unable to talk with pt at this time d/t several care providers working with/talking with her Data processing manager, Surgery PA, and RN). Will follow-up 07/19/16 for POC, especially as it relates to need for surgery and nutrition, and obtain all information from pt at that time.   Unable to perform physical assessment but will do so at follow-up. Per chart review, pt has lost 8 lbs (7.5% body weight) in the past 3 months which is significant for time frame. Pt likely meets criteria for malnutrition but unable to confirm at this time.   Medications reviewed; PRN Zofran, 40 mg IV Protonix BID, 10 mEq IV KCl x5 doses today.  Labs reviewed; K: 3.1 mmol/L, BUN: 32 mg/dL, Ca: 7 mg/dL.  IVF: D5-NS-20 mEq KCl @ 50 mL/hr (204 kcal).  Drip: Neo @ 80 mcg/min.      Diet Order:  Diet NPO time specified Except for: Ice Chips  Skin:  Reviewed, no issues  Last BM:  PTA/unknown  Height:   Ht Readings from Last 1 Encounters:  07/17/16 4\' 11"  (1.499 m)    Weight:   Wt Readings from Last 1 Encounters:  07/17/16 98 lb 8.7 oz (44.7 kg)    Ideal Body Weight:  42.91 kg  BMI:  Body mass index is 19.9 kg/m.  Estimated Nutritional Needs:   Kcal:  1120-1340 (25-30 kcal/kg)  Protein:  45-55 grams (1-1.2 grams/kg)  Fluid:  >/= 1.5 L/day  EDUCATION NEEDS:   No education needs identified at this time    Jarome Matin, MS, RD, LDN, CNSC Inpatient Clinical Dietitian Pager # [phone] After hours/weekend pager # [phone]

## 2016-07-17 NOTE — Progress Notes (Signed)
Subjective: Called with low BP and fever.  Pt reports having UTI and low BP back back on 06/27/16.  BP was low and sent to ED. Treated for dehydration and UTI with Levofloxacin and Rocephin. BP meds stopped at that time Admitted to ED with Nausea and vomiting, dark tarry stools, unable to eat much since before Christmas.  Currently BP 70/40, she is alert and answering questions.  No distress.  Temp 101.9, rectally.  telem SR with rate of 90.  She was seen before for weight loss  Since admit her BP has been in the SBP 90's to 70's.  She has had 4.5 liters of fluid so far since admit, I am giving a 500 ml bolus.  1200 urine output. Objective: Vital signs in last 24 hours: Temp:  [97.9 F (36.6 C)-101.5 F (38.6 C)] 101.5 F (38.6 C) (01/03 0923) Pulse Rate:  [55-102] 88 (01/03 0923) Resp:  [12-26] 14 (01/03 0923) BP: (55-107)/(27-69) 73/32 (01/03 0913) SpO2:  [80 %-100 %] 100 % (01/03 0923) Weight:  [43.1 kg (95 lb)-44.7 kg (98 lb 8.7 oz)] 44.7 kg (98 lb 8.7 oz) (01/03 0124)  labs K+ 3.0 Lactate is better H/H 7.1/20 CT last PM:  Worsening of small bowel obstruction since prior exam where it was noted mostly within the pelvis. There is marked gastric fluid-filled distention and reflux of contrast into the distal esophagus. Fluid-filled small bowel measures up to 4.7 cm in diameter. NG tube to suction is highly recommended given the potential risk of aspiration due to fluid-filled stomach and reflux.   Plain abdominal film this AM:  NG tube tip overlies the expected mid body of the stomach. 2. Some improvement in gaseous distention of small bowel. 3. Opacity medially at the left lung base suspicious for pneumonia, new compared to the CT images through the lung bases. 4. Gallstones in the right upper quadrant.   Intake/Output from previous day: 01/02 0701 - 01/03 0700 In: 4552.1 [I.V.:2052.1; IV Piggyback:2500] Out: [id number] [Urine:325] Intake/Output this shift: Total I/O In: 125  [I.V.:125] Out: 315 [Urine:315]  General appearance: alert, cooperative, no distress and answering questions well, no memory or cognition issues Resp: clear to auscultation bilaterally Cardio: regular rate and rhythm, S1, S2 normal, no murmur, click, rub or gallop and tachycardic GI: still distended not really tender, no BS, midline incision lower abdomen. Extremities: trace edema noted with SCD's.  Lab Results:   Recent Labs  07/16/16 1644 07/17/16 0347  WBC 17.2* 7.1  HGB 8.7* 7.1*  HCT 24.4* 20.0*  PLT 315 235    BMET  Recent Labs  07/16/16 1644 07/17/16 0347  NA 141 142  K 3.9 3.0*  CL 95* 108  CO2 35* 29  GLUCOSE 121* 103*  BUN 53* 36*  CREATININE 1.02* 0.73  CALCIUM 8.6* 7.2*   PT/INR  Recent Labs  07/16/16 1644  LABPROT 13.9  INR 1.06     Recent Labs Lab 07/16/16 1644  AST 26  ALT 14  ALKPHOS 61  BILITOT 0.9  PROT 5.6*  ALBUMIN 2.8*     Lipase     Component Value Date/Time   LIPASE 15 06/27/2016 1310     Studies/Results: Dg Abdomen 1 View  Result Date: 07/16/2016 CLINICAL DATA:  Initial evaluation for NG tube placement. EXAM: ABDOMEN - 1 VIEW COMPARISON:  Prior CT from same day. FINDINGS: Enteric tube in place with tip overlying the stomach. Side hole is at or just beyond the level the GE junction. Consider advancement by  3 cm to insure adequate placement within the stomach. Multiple dilated loops of small bowel again seen within the abdomen, compatible with obstruction. Finding better evaluated with CT from the same day. Visualized lung bases are clear. Scoliosis with multilevel degenerative spondylolysis noted. IMPRESSION: 1. NG tube in place with side hole at or just beyond the level the GE junction. Consider advancement by 3 cm to insure adequate placement within the stomach. 2. Multiple dilated gas-filled loops of small bowel within the abdomen, compatible with small bowel obstruction. Findings better evaluated on prior CT from earlier the  same day. Electronically Signed   By: Jeannine Boga M.D.   On: 07/16/2016 23:54   Ct Abdomen Pelvis W Contrast  Result Date: 07/16/2016 CLINICAL DATA:  Throwing up dark tarry emesis x1 week. Generalized abdominal pain. EXAM: CT ABDOMEN AND PELVIS WITH CONTRAST TECHNIQUE: Multidetector CT imaging of the abdomen and pelvis was performed using the standard protocol following bolus administration of intravenous contrast. CONTRAST:  60mL ISOVUE-300 IOPAMIDOL (ISOVUE-300) INJECTION 61% COMPARISON:  CT from 06/27/2016 FINDINGS: Lower chest: No acute abnormality. Fluid-filled distal esophagus likely secondary to reflux from marked gastric fluid-filled distention and small-bowel obstruction. Hepatobiliary: Mild intrahepatic ductal dilatation. There are least 2 gallstones are again identified without significant change measuring up to 9 mm in greatest dimension. No gallbladder wall thickening or distention. No space-occupying mass of the liver. Pancreas: Poorly visualized due to adjacent marked fluid-filled small bowel dilatation. Spleen: No splenomegaly. Adrenals/Urinary Tract: Adrenal glands are unremarkable. Kidneys are normal, without renal calculi, focal lesion, or hydronephrosis. Bladder is partially decompressed. Stomach/Bowel: Marked fluid-filled gastric distention with marked small bowel fluid-filled dilatation up to 4.7 cm. Decompressed large bowel. Findings are in keeping with high-grade small bowel obstruction. Exact source and transition zone is not identified the given history of prior hysterectomy and appendectomy as well as bladder repair, adhesions is a leading consideration. Vascular/Lymphatic: Extensive atherosclerotic calcifications of the abdominal aorta and iliac arteries. No lymphadenopathy. Reproductive: Uterus and adnexa are not well visualized. Other: There is a small volume of ascites interposed between dilated small bowel loops. Musculoskeletal: There shaped scoliosis of the thoracolumbar  spine. IMPRESSION: Worsening of small bowel obstruction since prior exam where it was noted mostly within the pelvis. There is marked gastric fluid-filled distention and reflux of contrast into the distal esophagus. Fluid-filled small bowel measures up to 4.7 cm in diameter. NG tube to suction is highly recommended given the potential risk of aspiration due to fluid-filled stomach and reflux. Critical Value/emergent results were called by telephone at the time of interpretation on 07/16/2016 at 9:08 pm to Dr. Davonna Belling , who verbally acknowledged these results. Uncomplicated cholelithiasis. Electronically Signed   By: Ashley Royalty M.D.   On: 07/16/2016 21:08   Dg Abd Acute W/chest  Result Date: 07/16/2016 CLINICAL DATA:  Vomiting for 1 week, generalized abdominal pain. EXAM: DG ABDOMEN ACUTE W/ 1V CHEST COMPARISON:  Chest x-ray dated 06/27/2016 and abdomen CT dated 06/27/2016. FINDINGS: Single view of the chest and two views of the abdomen are provided. Single view of the chest: Cardiomediastinal silhouette is stable. Atherosclerotic changes noted at the aortic arch. Lungs are at least mildly hyperexpanded suggesting COPD/ emphysema. Lungs appear clear. No pleural effusion or pneumothorax seen. Supine and decubitus views of the abdomen: Significantly distended bowel loops, most likely small bowel, are seen within the left abdomen and right pelvis, similar to the small-bowel distention seen on earlier CT abdomen of 06/27/2016. Air and stool is noted within the  right colon. There is no evidence of free intraperitoneal air. No soft tissue mass or abnormal fluid collection seen. Degenerative changes noted within the scoliotic lumbar spine. No acute or suspicious osseous finding seen. IMPRESSION: 1. No evidence of acute cardiopulmonary abnormality. No evidence of pneumonia. 2. Distended loops of gas-filled bowel within the left abdomen and right pelvis, favored to be small bowel loops, similar to the small bowel  distension seen on earlier CT abdomen of 06/27/2016. Chronic partial obstruction or ileus? Chronic institutional bowel syndrome? 3. Aortic atherosclerosis. Electronically Signed   By: Franki Cabot M.D.   On: 07/16/2016 17:50   Dg Abd Portable 1v-small Bowel Protocol-position Verification  Result Date: 07/17/2016 CLINICAL DATA:  NG tube placement, small bowel obstruction EXAM: PORTABLE ABDOMEN - 1 VIEW COMPARISON:  KUB of 07/16/2016 and CT abdomen and pelvis of 07/16/2016 FINDINGS: The tip of the NG tube overlies the expected mid body of the stomach. There appears to have been some slight improvement in the gaseous distention of small bowel with some colonic bowel gas present. However there is opacity noted medially at the left lung base and left lower lobe pneumonia is a consideration. The left lung base appeared clear on yesterday's CT of the abdomen. Thoracolumbar scoliosis again is noted. Contrast is within the urinary bladder from recent CT scan. Gallstones are present within the right upper quadrant. IMPRESSION: 1. NG tube tip overlies the expected mid body of the stomach. 2. Some improvement in gaseous distention of small bowel. 3. Opacity medially at the left lung base suspicious for pneumonia, new compared to the CT images through the lung bases. 4. Gallstones in the right upper quadrant. Electronically Signed   By: Ivar Drape M.D.   On: 07/17/2016 09:49   Prior to Admission medications   Medication Sig Start Date End Date Taking? Authorizing Provider  calcium carbonate (TUMS - DOSED IN MG ELEMENTAL CALCIUM) 500 MG chewable tablet Chew 1 tablet by mouth as needed for indigestion or heartburn.   Yes Historical Provider, MD  diphenhydrAMINE (BENADRYL) 25 MG tablet Take 25 mg by mouth every 6 (six) hours as needed for allergies.   Yes Historical Provider, MD  Docusate Calcium (STOOL SOFTENER PO) Take 1 tablet by mouth daily.   Yes Historical Provider, MD  ENSURE PLUS (ENSURE PLUS) LIQD Take 237 mLs  by mouth daily.   Yes Historical Provider, MD  gabapentin (NEURONTIN) 300 MG capsule Take 300 mg by mouth 3 (three) times daily.  03/04/14  Yes Historical Provider, MD  methocarbamol (ROBAXIN) 500 MG tablet Take 500 mg by mouth daily.    Yes Historical Provider, MD  MYRBETRIQ 25 MG TB24 tablet Take 25 mg by mouth 2 (two) times daily.  03/04/14  Yes Historical Provider, MD  naproxen sodium (ANAPROX) 220 MG tablet Take 440 mg by mouth 2 (two) times daily as needed (pain).    Yes Historical Provider, MD  pravastatin (PRAVACHOL) 20 MG tablet Take 20 mg by mouth daily.  09/05/12  Yes Historical Provider, MD  VESICARE 10 MG tablet Take 10 mg by mouth at bedtime.    Yes Historical Provider, MD  ipratropium (ATROVENT) 0.06 % nasal spray Place 2 sprays into both nostrils 4 (four) times daily. Patient not taking: Reported on 07/16/2016 05/07/16   Billy Fischer, MD  levofloxacin (LEVAQUIN) 500 MG tablet Take 1 tablet (500 mg total) by mouth daily. Patient not taking: Reported on 07/16/2016 06/27/16   Milton Ferguson, MD     Medications: . chlorhexidine  15 mL Mouth Rinse BID  . diatrizoate meglumine-sodium  90 mL Per NG tube Once  . diphenhydrAMINE  12.5 mg Intravenous Once  . mouth rinse  15 mL Mouth Rinse q12n4p  . pantoprazole (PROTONIX) IV  40 mg Intravenous QHS  . potassium chloride  10 mEq Intravenous Q1 Hr x 3  . potassium chloride  10 mEq Intravenous Q1 Hr x 5   . dextrose 5 % and 0.45 % NaCl with KCl 20 mEq/L 50 mL/hr at 07/17/16 [id number]    Assessment/Plan Sepsis/Hypotension - fluids, transfuse when temp is better, rule out MI Possible pneumonia - CXR, IV zosyn and vancomycin Severe anemia - recheck labs, transfuse when safe Possible GI bleed - Protonix, stool and NG occult's ordered SBO by CT - Hx of hysterectomy/appendectomy ? Hx Tobacco use - none for 3 days Malnutrition - prealbumin ordered  FEN:  IV fluids ID:  Starting Zosyn and Vancomycin DVT:  SCD - hold on anemia for Low H/H - possible  occult bleeding.    Plan:  Update labs now, rule out MI,  PCXR now to evaluate for pneumonia.  Start Zosyn and Vancomycin.  Check stool and NG for occult blood.  CCM/ Dr. Lake Bells is also seeing her and will add some pressors to help maintain pressures.  Discussed with Dr. Lake Bells and will try some IV Tylenol, the allergy report  is very vague.    LOS: 1 day    Erubiel Manasco 07/17/2016 [id number]

## 2016-07-17 NOTE — Progress Notes (Signed)
CRITICAL VALUE ALERT  Critical value received:  Hgb 5.9 Date of notification:  07/17/2016  Time of notification:  [id number]  Critical value read back:Yes.    Nurse who received alert:  Luther Parody, RN  MD notified (1st page):  [place]   Time of first page:  3  MD notified (2nd page):  Time of second page:  Responding MD:  Lake Bells  Time MD responded:  [id number]

## 2016-07-17 NOTE — Progress Notes (Signed)
Blood ordered- pre blood vitals revealed oral temperature of 101.5. On call notified. Will await further orders. Luther Parody, RN
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
## 2016-07-17 NOTE — Progress Notes (Signed)
Small bowel obstruction  Subjective: Pt resuscitated in the SDU last night.  She states her pain is better.  Objective: Vital signs in last 24 hours: Temp:  [97.9 F (36.6 C)-99.8 F (37.7 C)] 98.9 F (37.2 C) (01/03 0318) Pulse Rate:  [55-102] 90 (01/03 0800) Resp:  [12-26] 22 (01/03 0800) BP: (63-107)/(31-69) 86/49 (01/03 0800) SpO2:  [80 %-100 %] 100 % (01/03 0800) Weight:  [43.1 kg (95 lb)-44.7 kg (98 lb 8.7 oz)] 44.7 kg (98 lb 8.7 oz) (01/03 0124)    Intake/Output from previous day: 01/02 0701 - 01/03 0700 In: 4552.1 [I.V.:2052.1; IV Piggyback:2500] Out: [id number] [Urine:325] Intake/Output this shift: Total I/O In: 125 [I.V.:125] Out: 315 [Urine:315]  General appearance: alert and cooperative GI: soft, distended   Lab Results:  Results for orders placed or performed during the hospital encounter of 07/16/16 (from the past 24 hour(s))  Comprehensive metabolic panel     Status: Abnormal   Collection Time: 07/16/16  4:44 PM  Result Value Ref Range   Sodium 141 135 - 145 mmol/L   Potassium 3.9 3.5 - 5.1 mmol/L   Chloride 95 (L) 101 - 111 mmol/L   CO2 35 (H) 22 - 32 mmol/L   Glucose, Bld 121 (H) 65 - 99 mg/dL   BUN 53 (H) 6 - 20 mg/dL   Creatinine, Ser 1.02 (H) 0.44 - 1.00 mg/dL   Calcium 8.6 (L) 8.9 - 10.3 mg/dL   Total Protein 5.6 (L) 6.5 - 8.1 g/dL   Albumin 2.8 (L) 3.5 - 5.0 g/dL   AST 26 15 - 41 U/L   ALT 14 14 - 54 U/L   Alkaline Phosphatase 61 38 - 126 U/L   Total Bilirubin 0.9 0.3 - 1.2 mg/dL   GFR calc non Af Amer 54 (L) >60 mL/min   GFR calc Af Amer >60 >60 mL/min   Anion gap 11 5 - 15  CBC with Differential     Status: Abnormal   Collection Time: 07/16/16  4:44 PM  Result Value Ref Range   WBC 17.2 (H) 4.0 - 10.5 K/uL   RBC 2.49 (L) 3.87 - 5.11 MIL/uL   Hemoglobin 8.7 (L) 12.0 - 15.0 g/dL   HCT 24.4 (L) 36.0 - 46.0 %   MCV 98.0 78.0 - 100.0 fL   MCH 34.9 (H) 26.0 - 34.0 pg   MCHC 35.7 30.0 - 36.0 g/dL   RDW 13.8 11.5 - 15.5 %   Platelets 315 150 -  400 K/uL   Neutrophils Relative % 91 %   Neutro Abs 15.6 (H) 1.7 - 7.7 K/uL   Lymphocytes Relative 4 %   Lymphs Abs 0.7 0.7 - 4.0 K/uL   Monocytes Relative 5 %   Monocytes Absolute 0.8 0.1 - 1.0 K/uL   Eosinophils Relative 0 %   Eosinophils Absolute 0.0 0.0 - 0.7 K/uL   Basophils Relative 0 %   Basophils Absolute 0.0 0.0 - 0.1 K/uL  Protime-INR     Status: None   Collection Time: 07/16/16  4:44 PM  Result Value Ref Range   Prothrombin Time 13.9 11.4 - 15.2 seconds   INR 1.06   Ethanol     Status: None   Collection Time: 07/16/16  4:44 PM  Result Value Ref Range   Alcohol, Ethyl (B) <5 <5 mg/dL  Type and screen [place]     Status: None (Preliminary result)   Collection Time: 07/16/16  4:44 PM  Result Value Ref Range   Blood  Product Unit Number [id number]    Unit Type and Rh 5100    Blood Product Expiration Date XT:3149753    Blood Product Unit Number [id number]    Unit Type and Rh 5100    Blood Product Expiration Date XT:3149753   ABO/Rh     Status: None   Collection Time: 07/16/16  4:44 PM  Result Value Ref Range   ABO/RH(D) O POS   Sample to Blood Bank     Status: None   Collection Time: 07/16/16  4:52 PM  Result Value Ref Range   Blood Bank Specimen SAMPLE AVAILABLE FOR TESTING    Sample Expiration 07/19/2016   I-Stat CG4 Lactic Acid, ED     Status: Abnormal   Collection Time: 07/16/16  6:17 PM  Result Value Ref Range   Lactic Acid, Venous 2.34 (HH) 0.5 - 1.9 mmol/L   Comment NOTIFIED PHYSICIAN   POC occult blood, ED RN will collect     Status: None   Collection Time: 07/16/16  6:21 PM  Result Value Ref Range   Fecal Occult Bld NEGATIVE NEGATIVE  Urinalysis, Routine w reflex microscopic     Status: Abnormal   Collection Time: 07/16/16  8:00 PM  Result Value Ref Range   Color, Urine YELLOW YELLOW   APPearance HAZY (A) CLEAR   Specific Gravity, Urine 1.017 1.005 - 1.030   pH 5.0 5.0 - 8.0   Glucose, UA NEGATIVE NEGATIVE mg/dL    Hgb urine dipstick NEGATIVE NEGATIVE   Bilirubin Urine NEGATIVE NEGATIVE   Ketones, ur NEGATIVE NEGATIVE mg/dL   Protein, ur NEGATIVE NEGATIVE mg/dL   Nitrite NEGATIVE NEGATIVE   Leukocytes, UA NEGATIVE NEGATIVE  MRSA PCR Screening     Status: None   Collection Time: 07/17/16  1:24 AM  Result Value Ref Range   MRSA by PCR NEGATIVE NEGATIVE  Basic metabolic panel     Status: Abnormal   Collection Time: 07/17/16  3:47 AM  Result Value Ref Range   Sodium 142 135 - 145 mmol/L   Potassium 3.0 (L) 3.5 - 5.1 mmol/L   Chloride 108 101 - 111 mmol/L   CO2 29 22 - 32 mmol/L   Glucose, Bld 103 (H) 65 - 99 mg/dL   BUN 36 (H) 6 - 20 mg/dL   Creatinine, Ser 0.73 0.44 - 1.00 mg/dL   Calcium 7.2 (L) 8.9 - 10.3 mg/dL   GFR calc non Af Amer >60 >60 mL/min   GFR calc Af Amer >60 >60 mL/min   Anion gap 5 5 - 15  CBC     Status: Abnormal   Collection Time: 07/17/16  3:47 AM  Result Value Ref Range   WBC 7.1 4.0 - 10.5 K/uL   RBC 2.00 (L) 3.87 - 5.11 MIL/uL   Hemoglobin 7.1 (L) 12.0 - 15.0 g/dL   HCT 20.0 (L) 36.0 - 46.0 %   MCV 100.0 78.0 - 100.0 fL   MCH 35.5 (H) 26.0 - 34.0 pg   MCHC 35.5 30.0 - 36.0 g/dL   RDW 14.2 11.5 - 15.5 %   Platelets 235 150 - 400 K/uL  Lactic acid, plasma     Status: None   Collection Time: 07/17/16  3:47 AM  Result Value Ref Range   Lactic Acid, Venous 1.1 0.5 - 1.9 mmol/L  Prepare RBC     Status: None   Collection Time: 07/17/16  5:03 AM  Result Value Ref Range   Order Confirmation ORDER PROCESSED BY BLOOD BANK  Lactic acid, plasma     Status: None   Collection Time: 07/17/16  7:12 AM  Result Value Ref Range   Lactic Acid, Venous 1.2 0.5 - 1.9 mmol/L     Studies/Results Radiology     MEDS, Scheduled . sodium chloride   Intravenous Once  . chlorhexidine  15 mL Mouth Rinse BID  . diatrizoate meglumine-sodium  90 mL Per NG tube Once  . diphenhydrAMINE  12.5 mg Intravenous Once  . mouth rinse  15 mL Mouth Rinse q12n4p  . pantoprazole (PROTONIX) IV   40 mg Intravenous QHS     Assessment: Small bowel obstruction   Plan: Small bowel protocol started this AM. Hypokalemia: IV replacements today.  Anticipate continued losses Chronic anemia exacerbated by IV fluid resuscitation.  Pt hypotensive as well.  Will transfuse 2 units Discussed with Pt's son.  Anticipate she will need SNF upon d/c Will get PT and OT to eval once HD stable   LOS: 1 day    Rosario Adie, MD [facility], [place]   07/17/2016 9:00 AM

## 2016-07-17 NOTE — Progress Notes (Signed)
Pt BP 73/32. Pt sleepy, but easily aroused and answers all orientation questions appropriately. MD on call notified. Awaiting further orders. Will continue to monitor. S.Katelan Hirt, RN

## 2016-07-17 NOTE — Consult Note (Signed)
PULMONARY / CRITICAL CARE MEDICINE   Name: Ashley Howard MRN: [id number] DOB: [birth year]    ADMISSION DATE:  07/16/2016 CONSULTATION DATE:  07/17/2016  REFERRING MD:  Dr. Harlow Asa   CHIEF COMPLAINT:  Abdominal pain, nausea vomiting  HISTORY OF PRESENT ILLNESS:   This is a 71 year old female with a past medical history significant for gastroesophageal reflux disease and a hiatal hernia who came to the [facility] emergency department on 07/16/2016 complaining of nausea vomiting and belly pain. She was noted to have a small bowel obstruction so she was admitted by the general surgical service. A CT scan of her abdomen showed dilated small bowel but no clear transition point. An NG tube was placed and she was admitted to the intensive care unit. Overnight she's had persistent hypotension and now fevers. She says that she has been making some urine.  On further history, she tells me that she's been having anorexia for 3-4 weeks followed by 2-3 days of nausea vomiting. She thinks her last bowel movement was approximately one week ago. She said that for 2-3 days she was vomiting up significant amounts of black tarry liquid. She noted some mild abdominal pain with this.  She said some dyspnea over the last year but this has been relatively stable. No cough. Pulmonary and critical care medicine was consulted due to persistent hypotension.  PAST MEDICAL HISTORY :  She  has a past medical history of Arthritis; Bowel incontinence; Fluid retention in legs; Gallstones; GERD (gastroesophageal reflux disease); H/O hiatal hernia; Incontinence of urine; PONV (postoperative nausea and vomiting); Scoliosis; Seasonal allergies; Shortness of breath; and Weight loss, unintentional.  PAST SURGICAL HISTORY: She  has a past surgical history that includes Knee arthroscopy; Dobutamine stress echo; Breast surgery (1989); Tonsillectomy; adenoidectomy; Eye surgery; Appendectomy; Abdominal hysterectomy (1975); Bladder repair;  and Amputation (11/07/2011).  Allergies  Allergen Reactions  . Tape Other (See Comments)    Paper tape - blisters  . Tylenol [Acetaminophen] Other (See Comments)    Stop from urinating  Per MD, non-specific swelling in the 1970's, no anaphylactic type response per patient     No current facility-administered medications on file prior to encounter.    Current Outpatient Prescriptions on File Prior to Encounter  Medication Sig  . diphenhydrAMINE (BENADRYL) 25 MG tablet Take 25 mg by mouth every 6 (six) hours as needed for allergies.  Marland Kitchen ENSURE PLUS (ENSURE PLUS) LIQD Take 237 mLs by mouth daily.  Marland Kitchen gabapentin (NEURONTIN) 300 MG capsule Take 300 mg by mouth 3 (three) times daily.   . methocarbamol (ROBAXIN) 500 MG tablet Take 500 mg by mouth daily.   Marland Kitchen MYRBETRIQ 25 MG TB24 tablet Take 25 mg by mouth 2 (two) times daily.   . naproxen sodium (ANAPROX) 220 MG tablet Take 440 mg by mouth 2 (two) times daily as needed (pain).   . pravastatin (PRAVACHOL) 20 MG tablet Take 20 mg by mouth daily.   . VESICARE 10 MG tablet Take 10 mg by mouth at bedtime.   Marland Kitchen ipratropium (ATROVENT) 0.06 % nasal spray Place 2 sprays into both nostrils 4 (four) times daily. (Patient not taking: Reported on 07/16/2016)  . levofloxacin (LEVAQUIN) 500 MG tablet Take 1 tablet (500 mg total) by mouth daily. (Patient not taking: Reported on 07/16/2016)    FAMILY HISTORY:  Her indicated that her mother is deceased. She indicated that her father is deceased.    SOCIAL HISTORY: She  reports that she has been smoking Cigarettes.  She has  been smoking about 1.00 pack per day. She has never used smokeless tobacco. She reports that she does not drink alcohol or use drugs.  REVIEW OF SYSTEMS:   Gen: Denies fever, chills, weight change, + fatigue, night sweats HEENT: Denies blurred vision, double vision, hearing loss, tinnitus, sinus congestion, rhinorrhea, sore throat, neck stiffness, dysphagia PULM: Denies shortness of breath,  cough, sputum production, hemoptysis, wheezing CV: Denies chest pain, edema, orthopnea, paroxysmal nocturnal dyspnea, palpitations GI: per HPI GU: Denies dysuria, hematuria, polyuria, oliguria, urethral discharge Endocrine: Denies hot or cold intolerance, polyuria, polyphagia or appetite change Derm: Denies rash, dry skin, scaling or peeling skin change Heme: Denies easy bruising, bleeding, bleeding gums Neuro: Denies headache, numbness, weakness, slurred speech, loss of memory or consciousness   SUBJECTIVE:  As above  VITAL SIGNS: BP (!) 70/41   Pulse 88   Temp (!) 101.9 F (38.8 C) (Rectal)   Resp 18   Ht 4\' 11"  (1.499 m)   Wt 98 lb 8.7 oz (44.7 kg)   SpO2 99%   BMI 19.90 kg/m   HEMODYNAMICS:    VENTILATOR SETTINGS:    INTAKE / OUTPUT: I/O last 3 completed shifts: In: 4552.1 [I.V.:2052.1; IV Piggyback:2500] Out: [id number] [Urine:325; Other:550]  PHYSICAL EXAMINATION: General:  Awake, alert Neuro:  Awake, alert, oriented 4, moves all 4 extremities HEENT:  Normocephalic atraumatic, oropharynx with slightly dry mucous membranes Cardiovascular:  Regular rate and rhythm no murmurs gallops or rubs Lungs:  Clear to auscultation bilaterally, no wheezing, normal effort Abdomen:  Minimal bowel sounds on exam, mildly distended, minimal tenderness Musculoskeletal:  Normal bulk and tone Skin:  No rash or skin breakdown  LABS:  BMET  Recent Labs Lab 07/16/16 1644 07/17/16 0347  NA 141 142  K 3.9 3.0*  CL 95* 108  CO2 35* 29  BUN 53* 36*  CREATININE 1.02* 0.73  GLUCOSE 121* 103*    Electrolytes  Recent Labs Lab 07/16/16 1644 07/17/16 0347  CALCIUM 8.6* 7.2*    CBC  Recent Labs Lab 07/16/16 1644 07/17/16 0347 07/17/16 1100  WBC 17.2* 7.1 6.6  HGB 8.7* 7.1* 5.9*  HCT 24.4* 20.0* 17.2*  PLT 315 235 192    Coag's  Recent Labs Lab 07/16/16 1644  INR 1.06    Sepsis Markers  Recent Labs Lab 07/16/16 1817 07/17/16 0347 07/17/16 0712   LATICACIDVEN 2.34* 1.1 1.2    ABG No results for input(s): PHART, PCO2ART, PO2ART in the last 168 hours.  Liver Enzymes  Recent Labs Lab 07/16/16 1644  AST 26  ALT 14  ALKPHOS 61  BILITOT 0.9  ALBUMIN 2.8*    Cardiac Enzymes No results for input(s): TROPONINI, PROBNP in the last 168 hours.  Glucose No results for input(s): GLUCAP in the last 168 hours.  Imaging Dg Abdomen 1 View  Result Date: 07/16/2016 CLINICAL DATA:  Initial evaluation for NG tube placement. EXAM: ABDOMEN - 1 VIEW COMPARISON:  Prior CT from same day. FINDINGS: Enteric tube in place with tip overlying the stomach. Side hole is at or just beyond the level the GE junction. Consider advancement by 3 cm to insure adequate placement within the stomach. Multiple dilated loops of small bowel again seen within the abdomen, compatible with obstruction. Finding better evaluated with CT from the same day. Visualized lung bases are clear. Scoliosis with multilevel degenerative spondylolysis noted. IMPRESSION: 1. NG tube in place with side hole at or just beyond the level the GE junction. Consider advancement by 3 cm to insure adequate  placement within the stomach. 2. Multiple dilated gas-filled loops of small bowel within the abdomen, compatible with small bowel obstruction. Findings better evaluated on prior CT from earlier the same day. Electronically Signed   By: Jeannine Boga M.D.   On: 07/16/2016 23:54   Ct Abdomen Pelvis W Contrast  Result Date: 07/16/2016 CLINICAL DATA:  Throwing up dark tarry emesis x1 week. Generalized abdominal pain. EXAM: CT ABDOMEN AND PELVIS WITH CONTRAST TECHNIQUE: Multidetector CT imaging of the abdomen and pelvis was performed using the standard protocol following bolus administration of intravenous contrast. CONTRAST:  32mL ISOVUE-300 IOPAMIDOL (ISOVUE-300) INJECTION 61% COMPARISON:  CT from 06/27/2016 FINDINGS: Lower chest: No acute abnormality. Fluid-filled distal esophagus likely  secondary to reflux from marked gastric fluid-filled distention and small-bowel obstruction. Hepatobiliary: Mild intrahepatic ductal dilatation. There are least 2 gallstones are again identified without significant change measuring up to 9 mm in greatest dimension. No gallbladder wall thickening or distention. No space-occupying mass of the liver. Pancreas: Poorly visualized due to adjacent marked fluid-filled small bowel dilatation. Spleen: No splenomegaly. Adrenals/Urinary Tract: Adrenal glands are unremarkable. Kidneys are normal, without renal calculi, focal lesion, or hydronephrosis. Bladder is partially decompressed. Stomach/Bowel: Marked fluid-filled gastric distention with marked small bowel fluid-filled dilatation up to 4.7 cm. Decompressed large bowel. Findings are in keeping with high-grade small bowel obstruction. Exact source and transition zone is not identified the given history of prior hysterectomy and appendectomy as well as bladder repair, adhesions is a leading consideration. Vascular/Lymphatic: Extensive atherosclerotic calcifications of the abdominal aorta and iliac arteries. No lymphadenopathy. Reproductive: Uterus and adnexa are not well visualized. Other: There is a small volume of ascites interposed between dilated small bowel loops. Musculoskeletal: There shaped scoliosis of the thoracolumbar spine. IMPRESSION: Worsening of small bowel obstruction since prior exam where it was noted mostly within the pelvis. There is marked gastric fluid-filled distention and reflux of contrast into the distal esophagus. Fluid-filled small bowel measures up to 4.7 cm in diameter. NG tube to suction is highly recommended given the potential risk of aspiration due to fluid-filled stomach and reflux. Critical Value/emergent results were called by telephone at the time of interpretation on 07/16/2016 at 9:08 pm to Dr. Davonna Belling , who verbally acknowledged these results. Uncomplicated cholelithiasis.  Electronically Signed   By: Ashley Royalty M.D.   On: 07/16/2016 21:08   Dg Abd Acute W/chest  Result Date: 07/16/2016 CLINICAL DATA:  Vomiting for 1 week, generalized abdominal pain. EXAM: DG ABDOMEN ACUTE W/ 1V CHEST COMPARISON:  Chest x-ray dated 06/27/2016 and abdomen CT dated 06/27/2016. FINDINGS: Single view of the chest and two views of the abdomen are provided. Single view of the chest: Cardiomediastinal silhouette is stable. Atherosclerotic changes noted at the aortic arch. Lungs are at least mildly hyperexpanded suggesting COPD/ emphysema. Lungs appear clear. No pleural effusion or pneumothorax seen. Supine and decubitus views of the abdomen: Significantly distended bowel loops, most likely small bowel, are seen within the left abdomen and right pelvis, similar to the small-bowel distention seen on earlier CT abdomen of 06/27/2016. Air and stool is noted within the right colon. There is no evidence of free intraperitoneal air. No soft tissue mass or abnormal fluid collection seen. Degenerative changes noted within the scoliotic lumbar spine. No acute or suspicious osseous finding seen. IMPRESSION: 1. No evidence of acute cardiopulmonary abnormality. No evidence of pneumonia. 2. Distended loops of gas-filled bowel within the left abdomen and right pelvis, favored to be small bowel loops, similar to the small  bowel distension seen on earlier CT abdomen of 06/27/2016. Chronic partial obstruction or ileus? Chronic institutional bowel syndrome? 3. Aortic atherosclerosis. Electronically Signed   By: Franki Cabot M.D.   On: 07/16/2016 17:50   Dg Abd Portable 1v-small Bowel Protocol-position Verification  Result Date: 07/17/2016 CLINICAL DATA:  NG tube placement, small bowel obstruction EXAM: PORTABLE ABDOMEN - 1 VIEW COMPARISON:  KUB of 07/16/2016 and CT abdomen and pelvis of 07/16/2016 FINDINGS: The tip of the NG tube overlies the expected mid body of the stomach. There appears to have been some slight  improvement in the gaseous distention of small bowel with some colonic bowel gas present. However there is opacity noted medially at the left lung base and left lower lobe pneumonia is a consideration. The left lung base appeared clear on yesterday's CT of the abdomen. Thoracolumbar scoliosis again is noted. Contrast is within the urinary bladder from recent CT scan. Gallstones are present within the right upper quadrant. IMPRESSION: 1. NG tube tip overlies the expected mid body of the stomach. 2. Some improvement in gaseous distention of small bowel. 3. Opacity medially at the left lung base suspicious for pneumonia, new compared to the CT images through the lung bases. 4. Gallstones in the right upper quadrant. Electronically Signed   By: Ivar Drape M.D.   On: 07/17/2016 09:49   Dg Chest Port 1 View  Result Date: 07/17/2016 CLINICAL DATA:  Code sepsis, congestion EXAM: PORTABLE CHEST 1 VIEW COMPARISON:  07/16/2016 FINDINGS: Diffuse opacities in the lungs, new since prior study. This could represent edema or infection. Heart is upper limits normal in size. No effusions. NG tube enters the stomach. IMPRESSION: Diffuse bilateral airspace opacities concerning for edema or infection. Electronically Signed   By: Rolm Baptise M.D.   On: 07/17/2016 11:32   07/17/2016 chest x-ray images personally reviewed showing diffuse interstitial edema bilaterally, no clear consolidation  STUDIES:  07/16/2016 CT abdomen pelvis with contrast: Small bowel obstruction without clear transition point, there is fluid in the stomach and esophagus.  CULTURES: 1/3 Blood culture>   ANTIBIOTICS: Zosyn 07/17/2016 >  Vanc 07/17/2016 >   SIGNIFICANT EVENTS:   LINES/TUBES:   DISCUSSION: 71 year old female with a past medical history significant for hiatal hernia was admitted overnight for a small bowel obstruction. As of 07/17/2016 she has increasing hypotension and fever worrisome for sepsis. However, clinical picture is a  bit puzzling as she does seem volume deplete on physical exam there are chest x-ray suggestive of pulmonary edema (clinical exam not consistent with this). Differential diagnosis of her hypotension includes a cardiac event versus adrenal insufficiency versus ongoing hypovolemia which is most likely.  ASSESSMENT / PLAN:  PULMONARY A: Pulmonary edema on chest x-ray? Aspiration pneumonitis? P:   Monitor O2 saturation closely Continue gentle IV fluid resuscitation Monitor respiratory status in ICU See ID  CARDIOVASCULAR A:  Shock, vital signs consistent with septic shock and appears to be continued volume deplete based on physical exam; has been hypotensive since 1/2; normal lactic acid and adequate urine output are reassuring P:  Continue D5 NS as you are doing Give 1 U PRBC now Add neosynephrine for MAP > 65 May need CVL, reassess after blood transfusion Check cortisol Check 12 lead EKG now  RENAL A:   Hypokalemia P:   Stat repeat labs now Replace KCl Monitor BMET and UOP Replace electrolytes as needed   GASTROINTESTINAL A:   Small bowel obstruction GI Bleed? Black tarry emesis but no blood on gastric  emesis P:   NG tube to suction Per surgery NPO PPI BID Consider endoscopy  HEMATOLOGIC A:   Anemia P:  Monitor for bleeding Transfuse PRBC now, goal Hgb > 7gm/dL Repeat CBC later today  INFECTIOUS A:   Sepsis> gut translocation from SBO? Aspiration pneumonitis? P:   Agree with vanc/zosyn Blood cultures ordered  ENDOCRINE A:   No acute issues P:   Monitor glucose  NEUROLOGIC A:   No acute issues P:   Monitor mental status   FAMILY  - Updates: family updated bedside by me 1/3  My cc time 40 minutes  Roselie Awkward, MD [facility] Pager: [phone] Cell: [phone] After 3pm or if no response, call [phone]  07/17/2016, 11:37 AM

## 2016-07-17 NOTE — Progress Notes (Signed)
Pharmacy Antibiotic Note  Ashley Howard is a 71 y.o. female admitted on 07/16/2016 with small bowel obstruction.  Pharmacy has been consulted for vancomycin and zosyn dosing for rule out sepsis.  Plan:  Vancomycin 1g IV q24h  Check trough at steady state, goal 15-20 mcg/ml  Zosyn 3.375gm IV q8h (4hr extended infusions) per MD  Follow up renal function & cultures, de-escalate as appropriate  Height: 4\' 11"  (149.9 cm) Weight: 98 lb 8.7 oz (44.7 kg) IBW/kg (Calculated) : 43.2  Temp (24hrs), Avg:99.8 F (37.7 C), Min:97.9 F (36.6 C), Max:101.9 F (38.8 C)   Recent Labs Lab 07/16/16 1644 07/16/16 1817 07/17/16 0347 07/17/16 0712  WBC 17.2*  --  7.1  --   CREATININE 1.02*  --  0.73  --   LATICACIDVEN  --  2.34* 1.1 1.2    Estimated Creatinine Clearance: 43.4 mL/min (by C-G formula based on SCr of 0.73 mg/dL).    Allergies  Allergen Reactions  . Tape Other (See Comments)    Paper tape - blisters  . Tylenol [Acetaminophen] Other (See Comments)    Stop from urinating     Antimicrobials this admission:  1/3 Vanc >> 1/3 Zosyn >>  Dose adjustments this admission:  ---  Microbiology results:  12/14 UCx: Klebsiella (sensitive to all except R amp/unasyn) 1/3 MRSA PCR: neg  Thank you for allowing pharmacy to be a part of this patient's care.  Peggyann Juba, PharmD, BCPS Pager: [phone] 07/17/2016 11:00 AM

## 2016-07-18 ENCOUNTER — Inpatient Hospital Stay (HOSPITAL_COMMUNITY): Payer: Medicare Other

## 2016-07-18 DIAGNOSIS — R0902 Hypoxemia: Secondary | ICD-10-CM

## 2016-07-18 LAB — COMPREHENSIVE METABOLIC PANEL
ALBUMIN: 2 g/dL — AB (ref 3.5–5.0)
ALT: 15 U/L (ref 14–54)
AST: 18 U/L (ref 15–41)
Alkaline Phosphatase: 51 U/L (ref 38–126)
Anion gap: 5 (ref 5–15)
BUN: 18 mg/dL (ref 6–20)
CHLORIDE: 107 mmol/L (ref 101–111)
CO2: 22 mmol/L (ref 22–32)
CREATININE: 0.69 mg/dL (ref 0.44–1.00)
Calcium: 7.3 mg/dL — ABNORMAL LOW (ref 8.9–10.3)
GFR calc Af Amer: >60 mL/min (ref >60)
GFR calc non Af Amer: >60 mL/min (ref >60)
GLUCOSE: 103 mg/dL — AB (ref 65–99)
POTASSIUM: 3.2 mmol/L — AB (ref 3.5–5.1)
Sodium: 134 mmol/L — ABNORMAL LOW (ref 135–145)
Total Bilirubin: 0.8 mg/dL (ref 0.3–1.2)
Total Protein: 4.2 g/dL — ABNORMAL LOW (ref 6.5–8.1)

## 2016-07-18 LAB — BASIC METABOLIC PANEL
Anion gap: 8 (ref 5–15)
BUN: 20 mg/dL (ref 6–20)
CHLORIDE: 104 mmol/L (ref 101–111)
CO2: 22 mmol/L (ref 22–32)
CREATININE: 0.72 mg/dL (ref 0.44–1.00)
Calcium: 7.2 mg/dL — ABNORMAL LOW (ref 8.9–10.3)
GFR calc Af Amer: >60 mL/min (ref >60)
GFR calc non Af Amer: >60 mL/min (ref >60)
Glucose, Bld: 118 mg/dL — ABNORMAL HIGH (ref 65–99)
POTASSIUM: 3.2 mmol/L — AB (ref 3.5–5.1)
Sodium: 134 mmol/L — ABNORMAL LOW (ref 135–145)

## 2016-07-18 LAB — TYPE AND SCREEN
BLOOD PRODUCT EXPIRATION DATE: 201801122359
Blood Product Expiration Date: 201801122359
ISSUE DATE / TIME: 201801031347
ISSUE DATE / TIME: 201801031632
UNIT TYPE AND RH: 5100
Unit Type and Rh: 5100

## 2016-07-18 LAB — CBC
HEMATOCRIT: 28.2 % — AB (ref 36.0–46.0)
Hemoglobin: 10 g/dL — ABNORMAL LOW (ref 12.0–15.0)
MCH: 33.6 pg (ref 26.0–34.0)
MCHC: 35.5 g/dL (ref 30.0–36.0)
MCV: 94.6 fL (ref 78.0–100.0)
PLATELETS: 206 10*3/uL (ref 150–400)
RBC: 2.98 MIL/uL — ABNORMAL LOW (ref 3.87–5.11)
RDW: 17.6 % — AB (ref 11.5–15.5)
WBC: 10.3 10*3/uL (ref 4.0–10.5)

## 2016-07-18 LAB — PROCALCITONIN: Procalcitonin: 29.96 ng/mL

## 2016-07-18 LAB — TROPONIN I
TROPONIN I: 0.03 ng/mL — AB (ref <0.03)
TROPONIN I: 0.06 ng/mL — AB (ref <0.03)
Troponin I: 0.04 ng/mL (ref <0.03)
Troponin I: 0.06 ng/mL (ref <0.03)

## 2016-07-18 LAB — MAGNESIUM: MAGNESIUM: 1.6 mg/dL — AB (ref 1.7–2.4)

## 2016-07-18 MED ORDER — POTASSIUM CHLORIDE 10 MEQ/100ML IV SOLN
10.0000 meq | INTRAVENOUS | Status: AC
  Administered 2016-07-18 (×5): 10 meq via INTRAVENOUS
  Filled 2016-07-18 (×6): qty 100

## 2016-07-18 MED ORDER — KCL IN DEXTROSE-NACL 40-5-0.9 MEQ/L-%-% IV SOLN
INTRAVENOUS | Status: DC
  Administered 2016-07-18: 08:00:00 via INTRAVENOUS
  Filled 2016-07-18 (×3): qty 1000

## 2016-07-18 MED ORDER — MAGNESIUM SULFATE 2 GM/50ML IV SOLN
2.0000 g | Freq: Once | INTRAVENOUS | Status: AC
  Administered 2016-07-18: 2 g via INTRAVENOUS
  Filled 2016-07-18: qty 50

## 2016-07-18 NOTE — Progress Notes (Signed)
PULMONARY / CRITICAL CARE MEDICINE   Name: Ashley Howard MRN: [id number] DOB: 10-07-1945    ADMISSION DATE:  07/16/2016 CONSULTATION DATE:  07/17/2016  REFERRING MD:  Dr. Harlow Asa   CHIEF COMPLAINT:  Abdominal pain, nausea vomiting  HISTORY OF PRESENT ILLNESS:   This is a 71 year old female with a past medical history significant for gastroesophageal reflux disease and a hiatal hernia who came to the [facility] emergency department on 07/16/2016 complaining of nausea vomiting and belly pain. She was noted to have a small bowel obstruction so she was admitted by the general surgical service. A CT scan of her abdomen showed dilated small bowel but no clear transition point. An NG tube was placed and she was admitted to the intensive care unit. Overnight she's had persistent hypotension and now fevers. She says that she has been making some urine.  On further history, she reports that she's been having anorexia for 3-4 weeks followed by 2-3 days of nausea vomiting. She thinks her last bowel movement was approximately one week ago. She said that for 2-3 days she was vomiting up significant amounts of black tarry liquid. She noted some mild abdominal pain with this.  She said some dyspnea over the last year but this has been relatively stable. No cough. Pulmonary and critical care medicine was consulted due to persistent hypotension.    SUBJECTIVE:  Cough worse, abd pain increased, NAD at rest  VITAL SIGNS: BP 132/64   Pulse 60   Temp 98.3 F (36.8 C) (Oral)   Resp 17   Ht 4\' 11"  (1.499 m)   Wt 98 lb 8.7 oz (44.7 kg)   SpO2 94%   BMI 19.90 kg/m   HEMODYNAMICS:    VENTILATOR SETTINGS:    INTAKE / OUTPUT: I/O last 3 completed shifts: In: 9122.5 [I.V.:5269.5; Blood:603; IV Piggyback:3250] Out: [id number]; Emesis/NG output:1350; Other:550]  PHYSICAL EXAMINATION: General:  Awake, alert Neuro:  Awake, alert, oriented 4, moves all 4 extremities HEENT:  Normocephalic  atraumatic, oropharynx with slightly dry mucous membranes Cardiovascular:  Regular rate and rhythm no murmurs gallops or rubs Lungs:  Clear to auscultation bilaterally, no wheezing, normal effort, decreased bs bases, congested smokers cough Abdomen:  Minimal bowel sounds on exam, mildly distended, increased tenderness 1/4 Musculoskeletal:  Normal bulk and tone Skin:  No rash or skin breakdown  LABS:  BMET  Recent Labs Lab 07/17/16 0347 07/17/16 1100 07/18/16 0511  NA 142 142 134*  K 3.0* 3.1* 3.2*  CL 108 110 104  CO2 29 27 22   BUN 36* 32* 20  CREATININE 0.73 0.74 0.72  GLUCOSE 103* 91 118*    Electrolytes  Recent Labs Lab 07/17/16 0347 07/17/16 1100 07/18/16 0511  CALCIUM 7.2* 7.0* 7.2*    CBC  Recent Labs Lab 07/17/16 1100 07/17/16 2039 07/18/16 0511  WBC 6.6 9.2 10.3  HGB 5.9* 9.2* 10.0*  HCT 17.2* 26.4* 28.2*  PLT 192 217 206    Coag's  Recent Labs Lab 07/16/16 1644  INR 1.06    Sepsis Markers  Recent Labs Lab 07/16/16 1817 07/17/16 0347 07/17/16 0712 07/17/16 1100 07/18/16 0511  LATICACIDVEN 2.34* 1.1 1.2  --   --   PROCALCITON  --   --   --  0.14 29.96    ABG No results for input(s): PHART, PCO2ART, PO2ART in the last 168 hours.  Liver Enzymes  Recent Labs Lab 07/16/16 1644  AST 26  ALT 14  ALKPHOS 61  BILITOT 0.9  ALBUMIN 2.8*  Cardiac Enzymes  Recent Labs Lab 07/17/16 1659 07/17/16 2311 07/18/16 0511  TROPONINI 0.04* 0.03* 0.04*    Glucose No results for input(s): GLUCAP in the last 168 hours.  Imaging Dg Chest Port 1 View  Result Date: 07/18/2016 CLINICAL DATA:  Pneumonia EXAM: PORTABLE CHEST 1 VIEW COMPARISON:  07/17/2016 FINDINGS: Marked progression of diffuse bilateral airspace disease. Development of moderately large right pleural effusion. Bibasilar atelectasis has progressed. NG tube has been placed into the stomach. IMPRESSION: Marked progression of bilateral airspace disease, right greater than left.  Development of moderately large right pleural effusion. Findings are most typical for congestive heart failure with edema however pneumonia cannot be excluded. Electronically Signed   By: Franchot Gallo M.D.   On: 07/18/2016 08:04   Dg Abd Portable 1v  Result Date: 07/18/2016 CLINICAL DATA:  Follow up small bowel obstruction EXAM: PORTABLE ABDOMEN - 1 VIEW COMPARISON:  07/17/2016 FINDINGS: Scattered large and small bowel gas is noted. No obstructive changes are seen. Nasogastric catheter remains in the stomach. No free air is noted. Stable degenerative change of the lumbar spine is noted. IMPRESSION: No evidence of small bowel dilatation. Electronically Signed   By: Inez Catalina M.D.   On: 07/18/2016 08:01   Dg Abd Portable 1v-small Bowel Protocol-position Verification  Result Date: 07/17/2016 CLINICAL DATA:  NG tube placement, small bowel obstruction EXAM: PORTABLE ABDOMEN - 1 VIEW COMPARISON:  KUB of 07/16/2016 and CT abdomen and pelvis of 07/16/2016 FINDINGS: The tip of the NG tube overlies the expected mid body of the stomach. There appears to have been some slight improvement in the gaseous distention of small bowel with some colonic bowel gas present. However there is opacity noted medially at the left lung base and left lower lobe pneumonia is a consideration. The left lung base appeared clear on yesterday's CT of the abdomen. Thoracolumbar scoliosis again is noted. Contrast is within the urinary bladder from recent CT scan. Gallstones are present within the right upper quadrant. IMPRESSION: 1. NG tube tip overlies the expected mid body of the stomach. 2. Some improvement in gaseous distention of small bowel. 3. Opacity medially at the left lung base suspicious for pneumonia, new compared to the CT images through the lung bases. 4. Gallstones in the right upper quadrant. Electronically Signed   By: Ivar Drape M.D.   On: 07/17/2016 09:49   Dg Chest Port 1 View  Result Date: 07/17/2016 CLINICAL DATA:   Code sepsis, congestion EXAM: PORTABLE CHEST 1 VIEW COMPARISON:  07/16/2016 FINDINGS: Diffuse opacities in the lungs, new since prior study. This could represent edema or infection. Heart is upper limits normal in size. No effusions. NG tube enters the stomach. IMPRESSION: Diffuse bilateral airspace opacities concerning for edema or infection. Electronically Signed   By: Rolm Baptise M.D.   On: 07/17/2016 11:32   07/17/2016 chest x-ray images personally reviewed showing diffuse interstitial edema bilaterally, no clear consolidation  07/18/16 CxR with increased asdz  STUDIES:  07/16/2016 CT abdomen pelvis with contrast: Small bowel obstruction without clear transition point, there is fluid in the stomach and esophagus.  CULTURES: 1/3 Blood culture>   ANTIBIOTICS: Zosyn 07/17/2016 >  Vanc 07/17/2016 >   SIGNIFICANT EVENTS:   LINES/TUBES:   DISCUSSION: 71 year old female with a past medical history significant for hiatal hernia was admitted overnight for a small bowel obstruction. As of 07/17/2016 she has increasing hypotension and fever worrisome for sepsis. However, clinical picture is a bit puzzling as she does seem volume deplete  on physical exam there are chest x-ray suggestive of pulmonary edema (clinical exam not consistent with this). Differential diagnosis of her hypotension includes a cardiac event versus adrenal insufficiency versus ongoing hypovolemia which is most likely.  ASSESSMENT / PLAN:  PULMONARY A: Pulmonary edema on chest x-ray? Aspiration pneumonitis? P:   Monitor O2 saturation closely Continue gentle IV fluid resuscitation, goal equal I/o 1/4 Monitor respiratory status in ICU See ID  CARDIOVASCULAR A:  Shock, vital signs consistent with septic shock and appears to be continued volume deplete based on physical exam; has been hypotensive since 1/2; normal lactic acid and adequate urine output are reassuring 1/4 remains on neo P:  Continue D5 NS Give 1 U PRBC  1/3 neosynephrine for MAP > 65 Would not diurese at this time. CxR looks more ASDZ than edema 1/4. Per MD    RENAL  A:   Hypokalemia P:   Follow labs  Replace KCl Monitor BMET and UOP Replace electrolytes as needed per CCS 1/4   GASTROINTESTINAL A:   Small bowel obstruction GI Bleed? Black tarry emesis but no blood on gastric emesis P:   NG tube to suction(bloody drainage 1300 cc x 24 hrs) Per surgery NPO PPI BID Consider endoscopy  HEMATOLOGIC  Recent Labs  07/17/16 2039 07/18/16 0511  HGB 9.2* 10.0*    A:   Anemia P:  Monitor for bleeding Transfuse PRBC 1/3, goal Hgb > 7gm/dL Follow CBCy  INFECTIOUS A:   Sepsis> gut translocation from SBO? Aspiration pneumonitis? P:   Agree with vanc/zosyn Blood cultures ordered 1/4 Cxr worse, cont abx  ENDOCRINE A:   No acute issues P:   Monitor glucose  NEUROLOGIC A:   No acute issues P:   Monitor mental status   FAMILY  - Updates: family updated bedside by me 1/3  My cc time 30 minutes  Richardson Landry Demetrias Goodbar ACNP Maryanna Shape PCCM Pager [phone] till 3 pm If no answer page [phone] 07/18/2016, 8:42 AM

## 2016-07-18 NOTE — Progress Notes (Signed)
Subjective: She looks better this AM.  She still feels warm to me.  NG drainage is clearer this AM.  Still has abdominal distension.  Tired and coughing all night, says she didn't get much sleep.  Objective: Vital signs in last 24 hours: Temp:  [97.6 F (36.4 C)-102 F (38.9 C)] 97.6 F (36.4 C) (01/04 0329) Pulse Rate:  [53-94] 60 (01/04 0700) Resp:  [13-25] 17 (01/04 0700) BP: (55-137)/(27-73) 132/64 (01/04 0700) SpO2:  [90 %-100 %] 94 % (01/04 0700)   4570 IV Urine 865 recorded NG 1350 6 liter positive TM 101.9 yesterday AM SBP up to  110-130 range on low dose pressor sats 92-05 on O2 [place] K+ 3.2 Na 134 Troponins:   0.04, 0.04,0.03,0.04 WBC is normal and H/H is up to 10/28 CXR yesteday 1128 hours:  Diffuse bilateral airspace opacities concerning for edema or Infection NG was positive for blood Intake/Output from previous day: 01/03 0701 - 01/04 0700 In: 4570.4 [I.V.:3217.4; Blood:603; IV Piggyback:750] Out: 2215 [Urine:865; Emesis/NG output:1350] Intake/Output this shift: No intake/output data recorded.  General appearance: alert, cooperative and no distress Resp: some rale in bases, not wheezing right now. Cardio: regular with some PAC or PVC GI: SOFT, she is still distended, few bS, NG drainage is clear and green now.  Lab Results:   Recent Labs  07/17/16 2039 07/18/16 0511  WBC 9.2 10.3  HGB 9.2* 10.0*  HCT 26.4* 28.2*  PLT 217 206    BMET  Recent Labs  07/17/16 1100 07/18/16 0511  NA 142 134*  K 3.1* 3.2*  CL 110 104  CO2 27 22  GLUCOSE 91 118*  BUN 32* 20  CREATININE 0.74 0.72  CALCIUM 7.0* 7.2*   PT/INR  Recent Labs  07/16/16 1644  LABPROT 13.9  INR 1.06     Recent Labs Lab 07/16/16 1644  AST 26  ALT 14  ALKPHOS 61  BILITOT 0.9  PROT 5.6*  ALBUMIN 2.8*     Lipase     Component Value Date/Time   LIPASE 15 06/27/2016 1310     Studies/Results: Dg Abdomen 1 View  Result Date: 07/16/2016 CLINICAL DATA:  Initial  evaluation for NG tube placement. EXAM: ABDOMEN - 1 VIEW COMPARISON:  Prior CT from same day. FINDINGS: Enteric tube in place with tip overlying the stomach. Side hole is at or just beyond the level the GE junction. Consider advancement by 3 cm to insure adequate placement within the stomach. Multiple dilated loops of small bowel again seen within the abdomen, compatible with obstruction. Finding better evaluated with CT from the same day. Visualized lung bases are clear. Scoliosis with multilevel degenerative spondylolysis noted. IMPRESSION: 1. NG tube in place with side hole at or just beyond the level the GE junction. Consider advancement by 3 cm to insure adequate placement within the stomach. 2. Multiple dilated gas-filled loops of small bowel within the abdomen, compatible with small bowel obstruction. Findings better evaluated on prior CT from earlier the same day. Electronically Signed   By: Jeannine Boga M.D.   On: 07/16/2016 23:54   Ct Abdomen Pelvis W Contrast  Result Date: 07/16/2016 CLINICAL DATA:  Throwing up dark tarry emesis x1 week. Generalized abdominal pain. EXAM: CT ABDOMEN AND PELVIS WITH CONTRAST TECHNIQUE: Multidetector CT imaging of the abdomen and pelvis was performed using the standard protocol following bolus administration of intravenous contrast. CONTRAST:  12mL ISOVUE-300 IOPAMIDOL (ISOVUE-300) INJECTION 61% COMPARISON:  CT from 06/27/2016 FINDINGS: Lower chest: No acute abnormality. Fluid-filled distal  esophagus likely secondary to reflux from marked gastric fluid-filled distention and small-bowel obstruction. Hepatobiliary: Mild intrahepatic ductal dilatation. There are least 2 gallstones are again identified without significant change measuring up to 9 mm in greatest dimension. No gallbladder wall thickening or distention. No space-occupying mass of the liver. Pancreas: Poorly visualized due to adjacent marked fluid-filled small bowel dilatation. Spleen: No splenomegaly.  Adrenals/Urinary Tract: Adrenal glands are unremarkable. Kidneys are normal, without renal calculi, focal lesion, or hydronephrosis. Bladder is partially decompressed. Stomach/Bowel: Marked fluid-filled gastric distention with marked small bowel fluid-filled dilatation up to 4.7 cm. Decompressed large bowel. Findings are in keeping with high-grade small bowel obstruction. Exact source and transition zone is not identified the given history of prior hysterectomy and appendectomy as well as bladder repair, adhesions is a leading consideration. Vascular/Lymphatic: Extensive atherosclerotic calcifications of the abdominal aorta and iliac arteries. No lymphadenopathy. Reproductive: Uterus and adnexa are not well visualized. Other: There is a small volume of ascites interposed between dilated small bowel loops. Musculoskeletal: There shaped scoliosis of the thoracolumbar spine. IMPRESSION: Worsening of small bowel obstruction since prior exam where it was noted mostly within the pelvis. There is marked gastric fluid-filled distention and reflux of contrast into the distal esophagus. Fluid-filled small bowel measures up to 4.7 cm in diameter. NG tube to suction is highly recommended given the potential risk of aspiration due to fluid-filled stomach and reflux. Critical Value/emergent results were called by telephone at the time of interpretation on 07/16/2016 at 9:08 pm to Dr. Davonna Belling , who verbally acknowledged these results. Uncomplicated cholelithiasis. Electronically Signed   By: Ashley Royalty M.D.   On: 07/16/2016 21:08   Dg Abd Acute W/chest  Result Date: 07/16/2016 CLINICAL DATA:  Vomiting for 1 week, generalized abdominal pain. EXAM: DG ABDOMEN ACUTE W/ 1V CHEST COMPARISON:  Chest x-ray dated 06/27/2016 and abdomen CT dated 06/27/2016. FINDINGS: Single view of the chest and two views of the abdomen are provided. Single view of the chest: Cardiomediastinal silhouette is stable. Atherosclerotic changes noted  at the aortic arch. Lungs are at least mildly hyperexpanded suggesting COPD/ emphysema. Lungs appear clear. No pleural effusion or pneumothorax seen. Supine and decubitus views of the abdomen: Significantly distended bowel loops, most likely small bowel, are seen within the left abdomen and right pelvis, similar to the small-bowel distention seen on earlier CT abdomen of 06/27/2016. Air and stool is noted within the right colon. There is no evidence of free intraperitoneal air. No soft tissue mass or abnormal fluid collection seen. Degenerative changes noted within the scoliotic lumbar spine. No acute or suspicious osseous finding seen. IMPRESSION: 1. No evidence of acute cardiopulmonary abnormality. No evidence of pneumonia. 2. Distended loops of gas-filled bowel within the left abdomen and right pelvis, favored to be small bowel loops, similar to the small bowel distension seen on earlier CT abdomen of 06/27/2016. Chronic partial obstruction or ileus? Chronic institutional bowel syndrome? 3. Aortic atherosclerosis. Electronically Signed   By: Franki Cabot M.D.   On: 07/16/2016 17:50   Dg Abd Portable 1v-small Bowel Protocol-position Verification  Result Date: 07/17/2016 CLINICAL DATA:  NG tube placement, small bowel obstruction EXAM: PORTABLE ABDOMEN - 1 VIEW COMPARISON:  KUB of 07/16/2016 and CT abdomen and pelvis of 07/16/2016 FINDINGS: The tip of the NG tube overlies the expected mid body of the stomach. There appears to have been some slight improvement in the gaseous distention of small bowel with some colonic bowel gas present. However there is opacity noted medially at  the left lung base and left lower lobe pneumonia is a consideration. The left lung base appeared clear on yesterday's CT of the abdomen. Thoracolumbar scoliosis again is noted. Contrast is within the urinary bladder from recent CT scan. Gallstones are present within the right upper quadrant. IMPRESSION: 1. NG tube tip overlies the  expected mid body of the stomach. 2. Some improvement in gaseous distention of small bowel. 3. Opacity medially at the left lung base suspicious for pneumonia, new compared to the CT images through the lung bases. 4. Gallstones in the right upper quadrant. Electronically Signed   By: Ivar Drape M.D.   On: 07/17/2016 09:49   Dg Chest Port 1 View  Result Date: 07/17/2016 CLINICAL DATA:  Code sepsis, congestion EXAM: PORTABLE CHEST 1 VIEW COMPARISON:  07/16/2016 FINDINGS: Diffuse opacities in the lungs, new since prior study. This could represent edema or infection. Heart is upper limits normal in size. No effusions. NG tube enters the stomach. IMPRESSION: Diffuse bilateral airspace opacities concerning for edema or infection. Electronically Signed   By: Rolm Baptise M.D.   On: 07/17/2016 11:32    Medications: . chlorhexidine  15 mL Mouth Rinse BID  . diatrizoate meglumine-sodium  90 mL Per NG tube Once  . mouth rinse  15 mL Mouth Rinse q12n4p  . pantoprazole (PROTONIX) IV  40 mg Intravenous Q12H  . piperacillin-tazobactam (ZOSYN)  IV  3.375 g Intravenous Q8H  . vancomycin  1,000 mg Intravenous Q24H   . dextrose 5 % and 0.9 % NaCl with KCl 20 mEq/L 50 mL/hr at 07/17/16 1800  . phenylephrine (NEO-SYNEPHRINE) Adult infusion 85 mcg/min (07/18/16 [id number])    Assessment/Plan Sepsis/Hypotension - Temp better still on Neo No MI, most likely some demand ischemia Possible pneumonia - CXR, IV zosyn and vancomycin, - still with bilateral infiltrated, defer to CCM Severe anemia -transfused 2 units PRBC 07/17/16 Hypokalemia SBO by CT - Hx of hysterectomy/appendectomy - check film and do SB protocol today Hx Tobacco use - none for 3 days Malnutrition - prealbumin 7.8 FEN:  IV fluids ID:  Starting Zosyn and Vancomycin day 2 DVT:  SCD - hold on anemia for Low H/H - possible occult bleeding. If H/H remains stable after transfusion will add Lovenox tomorrow  Plan:   SB protocol pending film.  Continue NG  drainage and antibiotics.  Defer pressor and pulmonary to CCM.  Appreciate their help.          LOS: 2 days    Kari Montero 07/18/2016 [phone]

## 2016-07-19 ENCOUNTER — Inpatient Hospital Stay (HOSPITAL_COMMUNITY): Payer: Medicare Other

## 2016-07-19 LAB — MAGNESIUM: MAGNESIUM: 1.9 mg/dL (ref 1.7–2.4)

## 2016-07-19 LAB — BASIC METABOLIC PANEL
ANION GAP: 7 (ref 5–15)
BUN: 15 mg/dL (ref 6–20)
CO2: 23 mmol/L (ref 22–32)
Calcium: 7.6 mg/dL — ABNORMAL LOW (ref 8.9–10.3)
Chloride: 108 mmol/L (ref 101–111)
Creatinine, Ser: 0.66 mg/dL (ref 0.44–1.00)
Glucose, Bld: 92 mg/dL (ref 65–99)
POTASSIUM: 4 mmol/L (ref 3.5–5.1)
SODIUM: 138 mmol/L (ref 135–145)

## 2016-07-19 LAB — CBC
HEMATOCRIT: 27.5 % — AB (ref 36.0–46.0)
HEMOGLOBIN: 9.8 g/dL — AB (ref 12.0–15.0)
MCH: 33.7 pg (ref 26.0–34.0)
MCHC: 35.6 g/dL (ref 30.0–36.0)
MCV: 94.5 fL (ref 78.0–100.0)
Platelets: 187 10*3/uL (ref 150–400)
RBC: 2.91 MIL/uL — AB (ref 3.87–5.11)
RDW: 17.1 % — ABNORMAL HIGH (ref 11.5–15.5)
WBC: 12 10*3/uL — AB (ref 4.0–10.5)

## 2016-07-19 LAB — TROPONIN I
TROPONIN I: 0.05 ng/mL — AB (ref <0.03)
TROPONIN I: 0.05 ng/mL — AB (ref <0.03)
TROPONIN I: 0.03 ng/mL — AB (ref <0.03)
TROPONIN I: 0.03 ng/mL — AB (ref <0.03)
Troponin I: 0.05 ng/mL (ref <0.03)

## 2016-07-19 LAB — BRAIN NATRIURETIC PEPTIDE: B NATRIURETIC PEPTIDE 5: 856 pg/mL — AB (ref 0.0–100.0)

## 2016-07-19 LAB — PHOSPHORUS: PHOSPHORUS: 1.5 mg/dL — AB (ref 2.5–4.6)

## 2016-07-19 LAB — PROCALCITONIN: PROCALCITONIN: 36.7 ng/mL

## 2016-07-19 MED ORDER — IBUPROFEN 200 MG PO TABS
400.0000 mg | ORAL_TABLET | Freq: Four times a day (QID) | ORAL | Status: DC | PRN
  Administered 2016-07-19: 400 mg via ORAL
  Filled 2016-07-19: qty 2

## 2016-07-19 MED ORDER — ADULT MULTIVITAMIN LIQUID CH
15.0000 mL | Freq: Every day | ORAL | Status: DC
  Administered 2016-07-19: 10 mL via ORAL
  Administered 2016-07-20 – 2016-07-22 (×3): 15 mL via ORAL
  Filled 2016-07-19 (×4): qty 15

## 2016-07-19 MED ORDER — MAGNESIUM SULFATE 2 GM/50ML IV SOLN
2.0000 g | Freq: Once | INTRAVENOUS | Status: AC
  Administered 2016-07-19: 2 g via INTRAVENOUS
  Filled 2016-07-19: qty 50

## 2016-07-19 MED ORDER — SODIUM PHOSPHATES 45 MMOLE/15ML IV SOLN
30.0000 mmol | Freq: Once | INTRAVENOUS | Status: AC
  Administered 2016-07-19: 30 mmol via INTRAVENOUS
  Filled 2016-07-19: qty 10

## 2016-07-19 MED ORDER — BOOST / RESOURCE BREEZE PO LIQD
1.0000 | Freq: Three times a day (TID) | ORAL | Status: DC
  Administered 2016-07-19 – 2016-07-20 (×4): 1 via ORAL
  Administered 2016-07-21: 237 mL via ORAL
  Administered 2016-07-21 – 2016-07-22 (×3): 1 via ORAL

## 2016-07-19 MED ORDER — ACETAMINOPHEN 325 MG PO TABS
650.0000 mg | ORAL_TABLET | Freq: Four times a day (QID) | ORAL | Status: DC | PRN
  Administered 2016-07-20 – 2016-07-22 (×3): 650 mg via ORAL
  Filled 2016-07-19 (×3): qty 2

## 2016-07-19 MED ORDER — MIDODRINE HCL 5 MG PO TABS
5.0000 mg | ORAL_TABLET | Freq: Three times a day (TID) | ORAL | Status: AC
Start: 2016-07-19 — End: 2016-07-21
  Administered 2016-07-19 – 2016-07-21 (×6): 5 mg via ORAL
  Filled 2016-07-19 (×6): qty 1

## 2016-07-19 MED ORDER — ACETAMINOPHEN 160 MG/5ML PO SOLN
650.0000 mg | Freq: Four times a day (QID) | ORAL | Status: DC | PRN
  Administered 2016-07-19: 650 mg
  Filled 2016-07-19: qty 20.3

## 2016-07-19 MED ORDER — ALUM & MAG HYDROXIDE-SIMETH 200-200-20 MG/5ML PO SUSP
15.0000 mL | ORAL | Status: DC | PRN
  Administered 2016-07-19: 15 mL via ORAL
  Filled 2016-07-19: qty 30

## 2016-07-19 NOTE — Progress Notes (Signed)
CSW met with pt / son this am to assist with d/c planning. Pt's spouse was admitted to [facility] last night. PT has recommended SNF placement for pt. Pt has accepted bed at [facility] when stable for d/c. Pt will not be sharing room with spouse at facility. CSW will continue to follow to assist with d/c planning needs.  Werner Lean LCSW [phone]

## 2016-07-19 NOTE — Evaluation (Signed)
Physical Therapy Evaluation Patient Details Name: Ashley Howard MRN: [id number] DOB: 01/11/1946 Today's Date: 07/19/2016   History of Present Illness  Pt admitted through ED with SBO, septic shock and Asp PNA.  Pt with hx of peripheral neuropathy and L foot 1rst ray amputation  Clinical Impression  Pt admitted as above and presenting with functional mobility limitations 2* generalized weakness, poor endurance and ambulatory balance deficits.  Pt would greatly benefit from follow up rehab at SNF level to maximize IND and safety.    Follow Up Recommendations SNF    Equipment Recommendations  None recommended by PT    Recommendations for Other Services       Precautions / Restrictions Precautions Precautions: Fall Restrictions Weight Bearing Restrictions: No      Mobility  Bed Mobility Overal bed mobility: Needs Assistance Bed Mobility: Sit to Supine       Sit to supine: Min assist;Mod assist   General bed mobility comments: assist to bring LEs up into bed  Transfers Overall transfer level: Needs assistance Equipment used: Rolling walker (2 wheeled) Transfers: Sit to/from Stand Sit to Stand: Min assist;Mod assist;+2 physical assistance;+2 safety/equipment         General transfer comment: cues for transition position and use of UEs to self assist  Ambulation/Gait Ambulation/Gait assistance: Min assist;+2 safety/equipment Ambulation Distance (Feet): 20 Feet Assistive device: Rolling walker (2 wheeled) Gait Pattern/deviations: Step-to pattern;Decreased step length - right;Decreased step length - left;Shuffle;Trunk flexed Gait velocity: dec Gait velocity interpretation: Below normal speed for age/gender General Gait Details: Increased time and cues for posture and postion from RW.  Pt incontinent of bowel while ambulating  Stairs            Wheelchair Mobility    Modified Rankin (Stroke Patients Only)       Balance Overall balance assessment: Needs  assistance Sitting-balance support: No upper extremity supported;Feet supported Sitting balance-Leahy Scale: Fair     Standing balance support: Bilateral upper extremity supported Standing balance-Leahy Scale: Poor                               Pertinent Vitals/Pain Pain Assessment: No/denies pain    Home Living Family/patient expects to be discharged to:: Skilled nursing facility                      Prior Function Level of Independence: Independent with assistive device(s)         Comments: Per pt she utilized cane at home     Hand Dominance        Extremity/Trunk Assessment   Upper Extremity Assessment Upper Extremity Assessment: Generalized weakness    Lower Extremity Assessment Lower Extremity Assessment: Generalized weakness    Cervical / Trunk Assessment Cervical / Trunk Assessment: Kyphotic  Communication   Communication: No difficulties  Cognition Arousal/Alertness: Awake/alert Behavior During Therapy: WFL for tasks assessed/performed;Impulsive Overall Cognitive Status: Within Functional Limits for tasks assessed                      General Comments      Exercises     Assessment/Plan    PT Assessment Patient needs continued PT services  PT Problem List Decreased strength;Decreased range of motion;Decreased activity tolerance;Decreased balance;Decreased mobility;Decreased knowledge of use of DME          PT Treatment Interventions DME instruction;Gait training;Functional mobility training;Therapeutic activities;Therapeutic exercise;Balance training;Patient/family education  PT Goals (Current goals can be found in the Care Plan section)  Acute Rehab PT Goals Patient Stated Goal: Rehab and get stronger to regain IND PT Goal Formulation: With patient Time For Goal Achievement: 08/02/16 Potential to Achieve Goals: Fair    Frequency Min 3X/week   Barriers to discharge        Co-evaluation                End of Session Equipment Utilized During Treatment: Gait belt;Oxygen Activity Tolerance: Patient tolerated treatment well;Patient limited by fatigue Patient left: in bed;with call bell/phone within reach;with bed alarm set Nurse Communication: Mobility status         Time: [id number] PT Time Calculation (min) (ACUTE ONLY): 27 min   Charges:   PT Evaluation $PT Eval Moderate Complexity: 1 Procedure PT Treatments $Gait Training: 8-22 mins   PT G Codes:        Ashley Howard 08/01/16, 2:32 PM

## 2016-07-19 NOTE — Progress Notes (Signed)
[facility] Progress Note Patient Name: Ashley Howard DOB: 1946/01/17 MRN: [id number]   Date of Service  07/19/2016  HPI/Events of Note  hypophos  eICU Interventions  Phos replaced     Intervention Category Intermediate Interventions: Electrolyte abnormality - evaluation and management  DETERDING,ELIZABETH 07/19/2016, 6:16 AM

## 2016-07-19 NOTE — Progress Notes (Signed)
Subjective: She looks better, and would like to eat.  She still feel febrile to me, we are checking rectal temp.  Nurse has her working on IS now.  Few BS, having loose stools.  She is not obstructed on film yesterday.  She still having large NG output.  Not OOB so far.  Back on low dose Neo. Rectal temp 101.6 - oral temp running in the 99 range   Objective: Vital signs in last 24 hours: Temp:  [98.3 F (36.8 C)-100 F (37.8 C)] 99.9 F (37.7 C) (01/05 0345) Pulse Rate:  [54-78] 71 (01/05 0700) Resp:  [14-29] 20 (01/05 0700) BP: (73-136)/(39-72) 101/56 (01/05 0700) SpO2:  [87 %-98 %] 96 % (01/05 0700) Last BM Date:  (pta) 20 PO 2300 IV 300 urine recorded 1200 from the NG BM x 2 Tm 100 last PM 1900 BMP OK except phos + Ca is low Trop still up some 0.06 and 0.05 x 2 each WBC up some today, H/H is stable Procalcitonin up 36.7 CXR show:  Persistent congestive heart failure this AM DG abd yesterday shows:  No bowel obstruction. Contrast is seen within nonobstructed, nondistended small and large bowel loops to the level of the rectum. Intake/Output from previous day: 01/04 0701 - 01/05 0700 In: 2321 [P.O.:20; I.V.:1601; IV Piggyback:700] Out: 1500 [Urine:300; Emesis/NG output:1200] Intake/Output this shift: No intake/output data recorded.  General appearance: alert, cooperative, no distress and talks non stop Resp: clear to auscultation bilaterally and no wheezing few rales, down some in base, I think, she continue to talk GI: soft, nontender, mildy distended, few BS, having loose stools  Lab Results:   Recent Labs  07/18/16 0511 07/19/16 0508  WBC 10.3 12.0*  HGB 10.0* 9.8*  HCT 28.2* 27.5*  PLT 206 187    BMET  Recent Labs  07/18/16 1020 07/19/16 0508  NA 134* 138  K 3.2* 4.0  CL 107 108  CO2 22 23  GLUCOSE 103* 92  BUN 18 15  CREATININE 0.69 0.66  CALCIUM 7.3* 7.6*   PT/INR  Recent Labs  07/16/16 1644  LABPROT 13.9  INR 1.06     Recent  Labs Lab 07/16/16 1644 07/18/16 1020  AST 26 18  ALT 14 15  ALKPHOS 61 51  BILITOT 0.9 0.8  PROT 5.6* 4.2*  ALBUMIN 2.8* 2.0*     Lipase     Component Value Date/Time   LIPASE 15 06/27/2016 1310     Studies/Results: Dg Chest Port 1 View  Result Date: 07/19/2016 CLINICAL DATA:  Respiratory failure EXAM: PORTABLE CHEST 1 VIEW COMPARISON:  07/18/2016 FINDINGS: Nasogastric tube extends into the stomach. Central and basilar airspace opacities persist bilaterally, as well as pleural effusions, right greater than left. Little or no interval change. IMPRESSION: Persistent congestive heart failure. Electronically Signed   By: Andreas Newport M.D.   On: 07/19/2016 07:02   Dg Chest Port 1 View  Result Date: 07/18/2016 CLINICAL DATA:  Pneumonia EXAM: PORTABLE CHEST 1 VIEW COMPARISON:  07/17/2016 FINDINGS: Marked progression of diffuse bilateral airspace disease. Development of moderately large right pleural effusion. Bibasilar atelectasis has progressed. NG tube has been placed into the stomach. IMPRESSION: Marked progression of bilateral airspace disease, right greater than left. Development of moderately large right pleural effusion. Findings are most typical for congestive heart failure with edema however pneumonia cannot be excluded. Electronically Signed   By: Franchot Gallo M.D.   On: 07/18/2016 08:04   Dg Abd Portable 1v-small Bowel Obstruction Protocol-initial, 8 Hr  Delay  Result Date: 07/18/2016 CLINICAL DATA:  Small bowel obstruction. EXAM: PORTABLE ABDOMEN - 1 VIEW COMPARISON:  07/18/2016 and 0729 hours FINDINGS: Portable AP supine view of the abdomen pelvis demonstrates oral contrast within nonobstructed, nondistended small and large bowel loops to the level of the rectum. Gastric tube is seen in the left upper quadrant. Dextrorotatory scoliosis of the lumbar spine. No acute osseous abnormality. IMPRESSION: No bowel obstruction. Contrast is seen within nonobstructed, nondistended small  and large bowel loops to the level of the rectum. Electronically Signed   By: Ashley Royalty M.D.   On: 07/18/2016 18:24   Dg Abd Portable 1v  Result Date: 07/18/2016 CLINICAL DATA:  Follow up small bowel obstruction EXAM: PORTABLE ABDOMEN - 1 VIEW COMPARISON:  07/17/2016 FINDINGS: Scattered large and small bowel gas is noted. No obstructive changes are seen. Nasogastric catheter remains in the stomach. No free air is noted. Stable degenerative change of the lumbar spine is noted. IMPRESSION: No evidence of small bowel dilatation. Electronically Signed   By: Inez Catalina M.D.   On: 07/18/2016 08:01   Dg Abd Portable 1v-small Bowel Protocol-position Verification  Result Date: 07/17/2016 CLINICAL DATA:  NG tube placement, small bowel obstruction EXAM: PORTABLE ABDOMEN - 1 VIEW COMPARISON:  KUB of 07/16/2016 and CT abdomen and pelvis of 07/16/2016 FINDINGS: The tip of the NG tube overlies the expected mid body of the stomach. There appears to have been some slight improvement in the gaseous distention of small bowel with some colonic bowel gas present. However there is opacity noted medially at the left lung base and left lower lobe pneumonia is a consideration. The left lung base appeared clear on yesterday's CT of the abdomen. Thoracolumbar scoliosis again is noted. Contrast is within the urinary bladder from recent CT scan. Gallstones are present within the right upper quadrant. IMPRESSION: 1. NG tube tip overlies the expected mid body of the stomach. 2. Some improvement in gaseous distention of small bowel. 3. Opacity medially at the left lung base suspicious for pneumonia, new compared to the CT images through the lung bases. 4. Gallstones in the right upper quadrant. Electronically Signed   By: Ivar Drape M.D.   On: 07/17/2016 09:49   Dg Chest Port 1 View  Result Date: 07/17/2016 CLINICAL DATA:  Code sepsis, congestion EXAM: PORTABLE CHEST 1 VIEW COMPARISON:  07/16/2016 FINDINGS: Diffuse opacities in the  lungs, new since prior study. This could represent edema or infection. Heart is upper limits normal in size. No effusions. NG tube enters the stomach. IMPRESSION: Diffuse bilateral airspace opacities concerning for edema or infection. Electronically Signed   By: Rolm Baptise M.D.   On: 07/17/2016 11:32    Medications: . chlorhexidine  15 mL Mouth Rinse BID  . mouth rinse  15 mL Mouth Rinse q12n4p  . pantoprazole (PROTONIX) IV  40 mg Intravenous Q12H  . piperacillin-tazobactam (ZOSYN)  IV  3.375 g Intravenous Q8H  . sodium phosphate  Dextrose 5% IVPB  30 mmol Intravenous Once  . vancomycin  1,000 mg Intravenous Q24H   . dextrose 5 % and 0.9 % NaCl with KCl 40 mEq/L 50 mL/hr at 07/18/16 0820  . phenylephrine (NEO-SYNEPHRINE) Adult infusion 16 mcg/min (07/19/16 [id number])  Urine culture - MULTIPLE SPECIES PRESENT, current specimen;  Klebsiella on 06/27/16  Assessment/Plan Sepsis/Hypotension - Temp better still on Neo No MI, most likely some demand ischemia - ongoing low + troponin Possible pneumonia - CXR, IV zosyn and vancomycin, - still with bilateral infiltrated,  defer to CCM Severe anemia -transfused 2 units PRBC 07/17/16 Hypokalemia - resolved Hypocalcemia/low phos SBO by CT - Hx of hysterectomy/appendectomy - Contrast to the rectum on CT, still having alllot of NG drainage. Hx Tobacco use - none for 3 days prior to admit Malnutrition - prealbumin 7.8 FEN: IV fluids ID: Starting Zosyn and Vancomycin day 3 DVT: SCD - OK to start anticoagulation from our standpoint  Plan:  She does not have an SBO, most likely an ileus from her acute illness.  I am starting some clamping trials with the NG.  I don't want her aspirating and making pulmonary issues worse.  Add Motrin, she thinks she is allergic to Tylenol, although she did fine with IV Tylenol.  Add sips of clears from the floor, and see how she does.  Pull NG when we are sure it's going thru.  Low aspiration risk.       LOS: 3 days     Mathan Darroch 07/19/2016 [id number]

## 2016-07-19 NOTE — NC FL2 (Signed)
[place] LEVEL OF CARE SCREENING TOOL     IDENTIFICATION  Patient Name: Ashley Howard Birthdate: 11/25/1945 Sex: female Admission Date (Current Location): 07/16/2016  [facility] and [place] Number:  Herbalist and Address:  [facility],  [place] [address], [place]      Provider Number: [id number]  Attending Physician Name and Address:  Juanito Doom, MD  Relative Name and Phone Number:       Current Level of Care: Hospital Recommended Level of Care: [place] Prior Approval Number:    Date Approved/Denied:   PASRR Number: [id number]  Discharge Plan: SNF    Current Diagnoses: Patient Active Problem List   Diagnosis Date Noted  . Small bowel obstruction 07/16/2016  . Dehydration 07/16/2016  . Abnormality of gait 01/04/2013  . Pain of left great toe 01/04/2013    Orientation RESPIRATION BLADDER Height & Weight     Self, Time, Situation, Place  Normal Continent Weight: 98 lb 8.7 oz (44.7 kg) Height:  4\' 11"  (149.9 cm)  BEHAVIORAL SYMPTOMS/MOOD NEUROLOGICAL BOWEL NUTRITION STATUS  Other (Comment) (no behaviors)   Continent  (NPO but expecting to progress as condition improves)  AMBULATORY STATUS COMMUNICATION OF NEEDS Skin   Limited Assist Verbally Normal                       Personal Care Assistance Level of Assistance  Bathing, Feeding, Dressing Bathing Assistance: Limited assistance Feeding assistance: Independent Dressing Assistance: Limited assistance     Functional Limitations Info  Sight, Hearing, Speech Sight Info: Adequate Hearing Info: Adequate Speech Info: Adequate    SPECIAL CARE FACTORS FREQUENCY  PT (By licensed PT), OT (By licensed OT)     PT Frequency: 5x wk OT Frequency: 5x wk            Contractures Contractures Info: Not present    Additional Factors Info  Code Status Code Status Info: Full Code             Current Medications (07/19/2016):  This is the  current hospital active medication list Current Facility-Administered Medications  Medication Dose Route Frequency Provider Last Rate Last Dose  . acetaminophen (TYLENOL) solution 650 mg  650 mg Per Tube Q6H PRN Juanito Doom, MD      . chlorhexidine (PERIDEX) 0.12 % solution 15 mL  15 mL Mouth Rinse BID Armandina Gemma, MD   15 mL at 07/18/16 2135  . diphenhydrAMINE (BENADRYL) injection 12.5-25 mg  12.5-25 mg Intravenous Q6H PRN Earnstine Regal, PA-C   25 mg at 07/18/16 2337  . HYDROmorphone (DILAUDID) injection 1 mg  1 mg Intravenous Q2H PRN Armandina Gemma, MD      . iopamidol (ISOVUE-300) 61 % injection 30 mL  30 mL Oral Once PRN Davonna Belling, MD      . magnesium sulfate IVPB 2 g 50 mL  2 g Intravenous Once Juanito Doom, MD      . MEDLINE mouth rinse  15 mL Mouth Rinse q12n4p Armandina Gemma, MD   15 mL at 07/18/16 1637  . midodrine (PROAMATINE) tablet 5 mg  5 mg Oral TID WC Juanito Doom, MD      . ondansetron (ZOFRAN-ODT) disintegrating tablet 4 mg  4 mg Oral Q6H PRN Armandina Gemma, MD       Or  . ondansetron (ZOFRAN) injection 4 mg  4 mg Intravenous Q6H PRN Armandina Gemma, MD   4 mg at  07/19/16 [id number]  . pantoprazole (PROTONIX) injection 40 mg  40 mg Intravenous Q12H Earnstine Regal, PA-C   40 mg at 07/18/16 2142  . phenylephrine (NEO-SYNEPHRINE) 10 mg in dextrose 5 % 250 mL (0.04 mg/mL) infusion  30-200 mcg/min Intravenous Continuous Juanito Doom, MD   Stopped at 07/19/16 [id number]  . piperacillin-tazobactam (ZOSYN) IVPB 3.375 g  3.375 g Intravenous Q8H Earnstine Regal, PA-C   3.375 g at 07/19/16 0526  . sodium phosphate 30 mmol in dextrose 5 % 250 mL infusion  30 mmol Intravenous Once Colbert Coyer, MD   30 mmol at 07/19/16 [id number]     Discharge Medications: Please see discharge summary for a list of discharge medications.  Relevant Imaging Results:  Relevant Lab Results:   Additional Information SS # [id number]  Laraya Pestka, Randall An, LCSW

## 2016-07-19 NOTE — Clinical Social Work Placement (Signed)
   CLINICAL SOCIAL WORK PLACEMENT  NOTE  Date:  07/19/2016  Patient Details  Name: Ashley Howard MRN: [id number] Date of Birth: 08-Mar-1946  Clinical Social Work is seeking post-discharge placement for this patient at the [place] level of care (*CSW will initial, date and re-position this form in  chart as items are completed):  Yes   Patient/family provided with [place] Work Department's list of facilities offering this level of care within the geographic area requested by the patient (or if unable, by the patient's family).  Yes   Patient/family informed of their freedom to choose among providers that offer the needed level of care, that participate in Medicare, Medicaid or managed care program needed by the patient, have an available bed and are willing to accept the patient.  Yes   Patient/family informed of [facility]'s ownership interest in [facility] and [facility], as well as of the fact that they are under no obligation to receive care at these facilities.  PASRR submitted to EDS on 07/19/16     PASRR number received on 07/19/16     Existing PASRR number confirmed on       FL2 transmitted to all facilities in geographic area requested by pt/family on 07/19/16     FL2 transmitted to all facilities within larger geographic area on       Patient informed that his/her managed care company has contracts with or will negotiate with certain facilities, including the following:        Yes   Patient/family informed of bed offers received.  Patient chooses bed at [facility]     Physician recommends and patient chooses bed at      Patient to be transferred to   on  .  Patient to be transferred to facility by       Patient family notified on   of transfer.  Name of family member notified:        PHYSICIAN       Additional Comment:    _______________________________________________ Luretha Rued,  [place] 07/19/2016, 2:42 PM

## 2016-07-19 NOTE — Progress Notes (Addendum)
Nutrition Follow-up  DOCUMENTATION CODES:   Severe malnutrition in context of acute illness/injury  INTERVENTION:  - Continue to advance diet as medically feasible. - Will order Boost Breeze TID, each supplement provides 250 kcal and 9 grams of protein. - Will order daily liquid multivitamin.  - RD will follow-up 1/8.  NUTRITION DIAGNOSIS:   Inadequate oral intake related to inability to eat as evidenced by NPO status. -ongoing  GOAL:   Patient will meet greater than or equal to 90% of their needs -unmet  MONITOR:   Diet advancement, Supplement acceptance, Weight trends, Labs, Skin  ASSESSMENT:   71 yo WF brought to the ER by EMS with signs and symptoms of SBO.  Patient and husband found on floor in unheated home under a blanket.  Patient states she has had "constipation" for a long time.  Nausea and emesis began 3 days ago.  In ER, elevated WBC 17K, elevated lactate level.  CT scan with markedly distended stomach and dilated small bowel consistent with obstruction but no transition point seen.  1/5 Diet advanced to CLD earlier this AM. Pt has had sips of grape juice and apple juice and denies nausea or abdominal discomfort. RN in pt's room to remove NGT at this time. Pt states she is hungry and that she would like to have chicken broth and jello today. Will order Boost Breeze while on CLD to assist in meeting estimated protein need.   Pt confirms information about abdominal discomfort and poor appetite PTA. She states that her appetite fluctuated day-to-day and states that she has had a large amount of stress over the past few months. She also reports lack of taste for several months and that this occurs "when I have excess acid in my stomach." She also reports use of antibiotics during that time.   Physical assessment shows severe muscle and severe fat wasting. Pt meets criteria for severe malnutrition based on wasting and weight loss. Weight +1.6 kg since admission which is likely  fluid related given previous IVF.   Medications reviewed; 2 g IV Mg sulfate x1 dose yesterday and x1 dose today, PRN Zofran, 40 mg IV Protonix BID, 30 mmol Na Phos  x1 dose today.  Labs reviewed; Ca: 7.8 mg/dL, Phos: 1.5 mg/dL.    1/3 - Pt has been NPO since admission and unable to meet needs.  - NGT placed in ED yesterday. - Per chart review, pt had 3-4 weeks of anorexia followed by 2-3 days of N/V with associated mild abdominal pain.  - Emesis is described as black tarry liquid.  - Unable to talk with pt at this time d/t several care providers working with/talking with her Data processing manager, Surgery PA, and RN).  - Unable to perform physical assessment but will do so at follow-up.  - Per chart review, pt has lost 8 lbs (7.5% body weight) in the past 3 months which is significant for time frame.  - Pt likely meets criteria for malnutrition but unable to confirm at this time.   IVF: D5-NS-20 mEq KCl @ 50 mL/hr (204 kcal).  Drip: Neo @ 80 mcg/min.     Diet Order:  Diet clear liquid Room service appropriate? Yes; Fluid consistency: Thin  Skin:  Reviewed, no issues  Last BM:  1/5  Height:   Ht Readings from Last 1 Encounters:  07/17/16 4\' 11"  (1.499 m)    Weight:   Wt Readings from Last 1 Encounters:  07/17/16 98 lb 8.7 oz (44.7 kg)  Ideal Body Weight:  42.91 kg  BMI:  Body mass index is 19.9 kg/m.  Estimated Nutritional Needs:   Kcal:  1120-1340 (25-30 kcal/kg)  Protein:  45-55 grams (1-1.2 grams/kg)  Fluid:  >/= 1.5 L/day  EDUCATION NEEDS:   No education needs identified at this time    Jarome Matin, MS, RD, LDN, CNSC Inpatient Clinical Dietitian Pager # [phone] After hours/weekend pager # [phone]

## 2016-07-19 NOTE — Progress Notes (Signed)
PULMONARY / CRITICAL CARE MEDICINE   Name: Ashley Howard MRN: [id number] DOB: 1945/10/07    ADMISSION DATE:  07/16/2016 CONSULTATION DATE:  07/17/2016  REFERRING MD:  Dr. Harlow Asa   CHIEF COMPLAINT:  Abdominal pain, nausea vomiting  BRIEF: 71 y/o female with GERD and a hiatal hernia came to [facility] on 1/2 with a small bowel obstruction which lead to aspiration pneumonia and septic shock.  Chronically ill appearing, malnourished.  She and husband live in poor living conditions.     SUBJECTIVE:  Feels better this morning Some cough   VITAL SIGNS: BP (!) 116/55   Pulse 74   Temp (!) 101.6 F (38.7 C) (Rectal)   Resp 15   Ht 4\' 11"  (1.499 m)   Wt 98 lb 8.7 oz (44.7 kg)   SpO2 95%   BMI 19.90 kg/m   HEMODYNAMICS:    VENTILATOR SETTINGS:    INTAKE / OUTPUT: I/O last 3 completed shifts: In: 4502.2 [P.O.:20; I.V.:3682.2; IV [id number] Out: 2050 [Urine:300; Emesis/NG output:1750]  PHYSICAL EXAMINATION: General:  No distress in bed Neuro:  Awake, alert, oriented 4, moves all 4 extremities HEENT:  Normocephalic atraumatic, NG in place Cardiovascular:  Regular rate and rhythm no murmurs gallops or rubs Lungs:  Surprisingly clear, congested sounding cough Abdomen:  Bowel sounds positive, non tender Musculoskeletal:  Normal bulk and tone Skin:  No rash or skin breakdown  LABS:  BMET  Recent Labs Lab 07/18/16 0511 07/18/16 1020 07/19/16 0508  NA 134* 134* 138  K 3.2* 3.2* 4.0  CL 104 107 108  CO2 22 22 23   BUN 20 18 15   CREATININE 0.72 0.69 0.66  GLUCOSE 118* 103* 92    Electrolytes  Recent Labs Lab 07/18/16 0511 07/18/16 1020 07/19/16 0508  CALCIUM 7.2* 7.3* 7.6*  MG  --  1.6* 1.9  PHOS  --   --  1.5*    CBC  Recent Labs Lab 07/17/16 2039 07/18/16 0511 07/19/16 0508  WBC 9.2 10.3 12.0*  HGB 9.2* 10.0* 9.8*  HCT 26.4* 28.2* 27.5*  PLT 217 206 187    Coag's  Recent Labs Lab 07/16/16 1644  INR 1.06    Sepsis Markers  Recent  Labs Lab 07/16/16 1817 07/17/16 0347 07/17/16 0712 07/17/16 1100 07/18/16 0511 07/19/16 0508  LATICACIDVEN 2.34* 1.1 1.2  --   --   --   PROCALCITON  --   --   --  0.14 29.96 36.70    ABG No results for input(s): PHART, PCO2ART, PO2ART in the last 168 hours.  Liver Enzymes  Recent Labs Lab 07/16/16 1644 07/18/16 1020  AST 26 18  ALT 14 15  ALKPHOS 61 51  BILITOT 0.9 0.8  ALBUMIN 2.8* 2.0*    Cardiac Enzymes  Recent Labs Lab 07/18/16 1631 07/18/16 2313 07/19/16 0508  TROPONINI 0.06* 0.05* 0.05*    Glucose No results for input(s): GLUCAP in the last 168 hours.  Imaging Dg Chest Port 1 View  Result Date: 07/19/2016 CLINICAL DATA:  Respiratory failure EXAM: PORTABLE CHEST 1 VIEW COMPARISON:  07/18/2016 FINDINGS: Nasogastric tube extends into the stomach. Central and basilar airspace opacities persist bilaterally, as well as pleural effusions, right greater than left. Little or no interval change. IMPRESSION: Persistent congestive heart failure. Electronically Signed   By: Andreas Newport M.D.   On: 07/19/2016 07:02   Dg Abd Portable 1v-small Bowel Obstruction Protocol-initial, 8 Hr Delay  Result Date: 07/18/2016 CLINICAL DATA:  Small bowel obstruction. EXAM: PORTABLE ABDOMEN - 1 VIEW  COMPARISON:  07/18/2016 and 0729 hours FINDINGS: Portable AP supine view of the abdomen pelvis demonstrates oral contrast within nonobstructed, nondistended small and large bowel loops to the level of the rectum. Gastric tube is seen in the left upper quadrant. Dextrorotatory scoliosis of the lumbar spine. No acute osseous abnormality. IMPRESSION: No bowel obstruction. Contrast is seen within nonobstructed, nondistended small and large bowel loops to the level of the rectum. Electronically Signed   By: Ashley Royalty M.D.   On: 07/18/2016 18:24   07/17/2016 chest x-ray images personally reviewed showing diffuse interstitial edema bilaterally, no clear consolidation  1/5 CXR > persistent R>L  airspace disease and effusion on R  STUDIES:  07/16/2016 CT abdomen pelvis with contrast: Small bowel obstruction without clear transition point, there is fluid in the stomach and esophagus.  CULTURES: 1/3 Blood culture>   ANTIBIOTICS: Zosyn 07/17/2016 >  Vanc 07/17/2016 > 1/5  SIGNIFICANT EVENTS:   LINES/TUBES:   DISCUSSION: 71 year old female with a past medical history significant for hiatal hernia was admitted 1/2 for a small bowel obstruction which lead to aspiration pneumonia and septic shock.  At baseline appears chronically malnourised, lives in poor living conditions.  ASSESSMENT / PLAN:  PULMONARY A: Aspiration pneumonia, doubt pulmonary edema based on physical exam P:   Monitor O2 saturation closely Out of bed, incentive spirometry Check pro-BNP, if elevated then give gentle diuresis and check echo Stop IVF Monitor respiratory status in ICU See ID  CARDIOVASCULAR A:  Sept shock Doubt CHF P:  Tele Stop IVF neosynephrine for MAP > 55 Add midodrine tid x2 days 1/5 F/u pro-BNP as above  RENAL  A:   Hypokalemia Hypophos P:   Kphos 1/5 Monitor BMET and UOP Replace electrolytes as needed  GASTROINTESTINAL A:   Small bowel obstruction GI Bleed? Unclear, no frank bleeding here P:   NG tube and diet per sugery D/C NSAIDs PPI BID KUB per surgery  HEMATOLOGIC  A:   Anemia P:  Monitor for bleeding Transfuse PRBC for goal Hgb > 7gm/dL Follow CBC  INFECTIOUS A:   Sepsis> Aspiration pneumonia P:   Follow cultures Stop vanc Continue zosyn  ENDOCRINE A:   No acute issues P:   Monitor glucose  NEUROLOGIC A:   No acute issues P:   Monitor mental status   FAMILY  - Updates: family updated bedside by me 1/3, none present 1/5  PCCM will assume primary care if OK by surgery  My cc time 37 minutes  Roselie Awkward, MD [facility] Pager: [phone] Cell: [phone] After 3pm or if no response, call [phone]  07/19/2016, 9:11  AM
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
## 2016-07-20 DIAGNOSIS — E43 Unspecified severe protein-calorie malnutrition: Secondary | ICD-10-CM | POA: Insufficient documentation

## 2016-07-20 LAB — CBC WITH DIFFERENTIAL/PLATELET
Basophils Absolute: 0 10*3/uL (ref 0.0–0.1)
Basophils Relative: 0 %
Eosinophils Absolute: 0.2 10*3/uL (ref 0.0–0.7)
Eosinophils Relative: 1 %
HEMATOCRIT: 27.6 % — AB (ref 36.0–46.0)
HEMOGLOBIN: 9.4 g/dL — AB (ref 12.0–15.0)
LYMPHS ABS: 0.8 10*3/uL (ref 0.7–4.0)
Lymphocytes Relative: 6 %
MCH: 32 pg (ref 26.0–34.0)
MCHC: 34.1 g/dL (ref 30.0–36.0)
MCV: 93.9 fL (ref 78.0–100.0)
Monocytes Absolute: 0.7 10*3/uL (ref 0.1–1.0)
Monocytes Relative: 5 %
NEUTROS ABS: 11.8 10*3/uL — AB (ref 1.7–7.7)
NEUTROS PCT: 88 %
Platelets: 205 10*3/uL (ref 150–400)
RBC: 2.94 MIL/uL — AB (ref 3.87–5.11)
RDW: 16.2 % — ABNORMAL HIGH (ref 11.5–15.5)
WBC: 13.5 10*3/uL — AB (ref 4.0–10.5)

## 2016-07-20 LAB — BASIC METABOLIC PANEL
ANION GAP: 7 (ref 5–15)
BUN: 13 mg/dL (ref 6–20)
CALCIUM: 7.4 mg/dL — AB (ref 8.9–10.3)
CHLORIDE: 107 mmol/L (ref 101–111)
CO2: 20 mmol/L — AB (ref 22–32)
Creatinine, Ser: 0.45 mg/dL (ref 0.44–1.00)
GFR calc non Af Amer: >60 mL/min (ref >60)
Glucose, Bld: 93 mg/dL (ref 65–99)
Potassium: 3.8 mmol/L (ref 3.5–5.1)
Sodium: 134 mmol/L — ABNORMAL LOW (ref 135–145)

## 2016-07-20 LAB — TROPONIN I: Troponin I: <0.03 ng/mL (ref <0.03)

## 2016-07-20 LAB — OCCULT BLOOD X 1 CARD TO LAB, STOOL: Fecal Occult Bld: POSITIVE — AB

## 2016-07-20 MED ORDER — HYDROMORPHONE HCL 1 MG/ML IJ SOLN: 0.5000 mg | INTRAMUSCULAR | Status: DC | PRN

## 2016-07-20 NOTE — Progress Notes (Signed)
Subjective: Tolerating clears without nausea or emesis. Having bowel movements. Intermittently febrile o/n and on low dose neo this Am.    Objective: Vital signs in last 24 hours: Temp:  [99.8 F (37.7 C)-101.6 F (38.7 C)] 100.3 F (37.9 C) (01/06 0400) Pulse Rate:  [54-87] 68 (01/06 0700) Resp:  [15-26] 21 (01/06 0700) BP: (68-116)/(27-65) 79/51 (01/06 0700) SpO2:  [90 %-97 %] 92 % (01/06 0700) Last BM Date: 07/20/16 320 PO 150 IV 4x urine recorded BM x 3 Tm 101.3 last PM 1730  Intake/Output from previous day: 01/05 0701 - 01/06 0700 In: 470.4 [I.V.:320.4; IV Piggyback:150] Out: -  Intake/Output this shift: No intake/output data recorded.  General appearance: alert, cooperative, no distress and talks non stop Resp: clear to auscultation bilaterally and no wheezing few rales, down some in base, I think, she continue to talk GI: soft, nontender, mildy distended, few BS, having loose stools  Lab Results:   Recent Labs  07/19/16 0508 07/20/16 0507  WBC 12.0* 13.5*  HGB 9.8* 9.4*  HCT 27.5* 27.6*  PLT 187 205    BMET  Recent Labs  07/19/16 0508 07/20/16 0507  NA 138 134*  K 4.0 3.8  CL 108 107  CO2 23 20*  GLUCOSE 92 93  BUN 15 13  CREATININE 0.66 0.45  CALCIUM 7.6* 7.4*   PT/INR No results for input(s): LABPROT, INR in the last 72 hours.   Recent Labs Lab 07/16/16 1644 07/18/16 1020  AST 26 18  ALT 14 15  ALKPHOS 61 51  BILITOT 0.9 0.8  PROT 5.6* 4.2*  ALBUMIN 2.8* 2.0*     Lipase     Component Value Date/Time   LIPASE 15 06/27/2016 1310     Studies/Results: Dg Chest Port 1 View  Result Date: 07/19/2016 CLINICAL DATA:  Respiratory failure EXAM: PORTABLE CHEST 1 VIEW COMPARISON:  07/18/2016 FINDINGS: Nasogastric tube extends into the stomach. Central and basilar airspace opacities persist bilaterally, as well as pleural effusions, right greater than left. Little or no interval change. IMPRESSION: Persistent congestive heart failure.  Electronically Signed   By: Andreas Newport M.D.   On: 07/19/2016 07:02   Dg Chest Port 1 View  Result Date: 07/18/2016 CLINICAL DATA:  Pneumonia EXAM: PORTABLE CHEST 1 VIEW COMPARISON:  07/17/2016 FINDINGS: Marked progression of diffuse bilateral airspace disease. Development of moderately large right pleural effusion. Bibasilar atelectasis has progressed. NG tube has been placed into the stomach. IMPRESSION: Marked progression of bilateral airspace disease, right greater than left. Development of moderately large right pleural effusion. Findings are most typical for congestive heart failure with edema however pneumonia cannot be excluded. Electronically Signed   By: Franchot Gallo M.D.   On: 07/18/2016 08:04   Dg Abd Portable 1v-small Bowel Obstruction Protocol-24 Hr Delay  Result Date: 07/19/2016 CLINICAL DATA:  71 year old female undergoing small bowel follow-through. 24 hour delayed film. EXAM: PORTABLE ABDOMEN - 1 VIEW COMPARISON:  Most recent prior radiograph 07/18/2016 FINDINGS: Continued distal progression of ingested oral contrast material. All of the contrast is now within the colon. The sigmoid colon is redundant. Scattered colonic diverticula are present. Gas can be seen within several loops of small bowel. A nasogastric tube is present with the tip in the stomach. Multilevel degenerative disc disease throughout the spine with dextroconvex and rotary scoliosis. The bones appear osteopenic. IMPRESSION: Continued distal progression of oral contrast material. All contrast is now within the colon. Mild colonic diverticulosis. Electronically Signed   By: Dellis Filbert.D.  On: 07/19/2016 09:40   Dg Abd Portable 1v-small Bowel Obstruction Protocol-initial, 8 Hr Delay  Result Date: 07/18/2016 CLINICAL DATA:  Small bowel obstruction. EXAM: PORTABLE ABDOMEN - 1 VIEW COMPARISON:  07/18/2016 and 0729 hours FINDINGS: Portable AP supine view of the abdomen pelvis demonstrates oral contrast within  nonobstructed, nondistended small and large bowel loops to the level of the rectum. Gastric tube is seen in the left upper quadrant. Dextrorotatory scoliosis of the lumbar spine. No acute osseous abnormality. IMPRESSION: No bowel obstruction. Contrast is seen within nonobstructed, nondistended small and large bowel loops to the level of the rectum. Electronically Signed   By: Ashley Royalty M.D.   On: 07/18/2016 18:24   Dg Abd Portable 1v  Result Date: 07/18/2016 CLINICAL DATA:  Follow up small bowel obstruction EXAM: PORTABLE ABDOMEN - 1 VIEW COMPARISON:  07/17/2016 FINDINGS: Scattered large and small bowel gas is noted. No obstructive changes are seen. Nasogastric catheter remains in the stomach. No free air is noted. Stable degenerative change of the lumbar spine is noted. IMPRESSION: No evidence of small bowel dilatation. Electronically Signed   By: Inez Catalina M.D.   On: 07/18/2016 08:01    Medications: . chlorhexidine  15 mL Mouth Rinse BID  . feeding supplement  1 Container Oral TID BM  . mouth rinse  15 mL Mouth Rinse q12n4p  . midodrine  5 mg Oral TID WC  . multivitamin  15 mL Oral Daily  . pantoprazole (PROTONIX) IV  40 mg Intravenous Q12H  . piperacillin-tazobactam (ZOSYN)  IV  3.375 g Intravenous Q8H   . phenylephrine (NEO-SYNEPHRINE) Adult infusion 10 mcg/min (07/20/16 0600)  Urine culture - MULTIPLE SPECIES PRESENT, current specimen;  Klebsiella on 06/27/16  Assessment/Plan Sepsis/Hypotension - Temp better still on Neo No MI, most likely some demand ischemia - trop normalized Possible pneumonia -  IV zosyn, defer to CCM Severe anemia -transfused 2 units PRBC 07/17/16, hgb stable today Hypokalemia - resolved Hypocalcemia/low phos SBO by CT - Hx of hysterectomy/appendectomy - Contrast to the rectum on CT, tolerating clears and having bm. Hx Tobacco use - none for 3 days prior to admit Malnutrition - prealbumin 7.8 FEN: IV fluids ID: wbc up slightly, on zosyn DVT: SCD - OK to  start anticoagulation from our standpoint  Plan:  NG out and tolerating clears having bowel function will advance to full liquid diet. Mobilize/out of bed as tolerated. Will continue to follow.        LOS: 4 days    Clovis Riley MD 07/20/2016
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
## 2016-07-21 LAB — BASIC METABOLIC PANEL
ANION GAP: 7 (ref 5–15)
BUN: 13 mg/dL (ref 6–20)
CALCIUM: 7.6 mg/dL — AB (ref 8.9–10.3)
CO2: 23 mmol/L (ref 22–32)
CREATININE: 0.5 mg/dL (ref 0.44–1.00)
Chloride: 104 mmol/L (ref 101–111)
Glucose, Bld: 103 mg/dL — ABNORMAL HIGH (ref 65–99)
Potassium: 3.8 mmol/L (ref 3.5–5.1)
SODIUM: 134 mmol/L — AB (ref 135–145)

## 2016-07-21 LAB — CBC
HEMATOCRIT: 26.2 % — AB (ref 36.0–46.0)
Hemoglobin: 9.2 g/dL — ABNORMAL LOW (ref 12.0–15.0)
MCH: 33.3 pg (ref 26.0–34.0)
MCHC: 35.1 g/dL (ref 30.0–36.0)
MCV: 94.9 fL (ref 78.0–100.0)
PLATELETS: 211 10*3/uL (ref 150–400)
RBC: 2.76 MIL/uL — ABNORMAL LOW (ref 3.87–5.11)
RDW: 15.6 % — AB (ref 11.5–15.5)
WBC: 14.1 10*3/uL — AB (ref 4.0–10.5)

## 2016-07-21 LAB — MAGNESIUM: MAGNESIUM: 1.7 mg/dL (ref 1.7–2.4)

## 2016-07-21 LAB — PHOSPHORUS: PHOSPHORUS: 2 mg/dL — AB (ref 2.5–4.6)

## 2016-07-21 NOTE — Progress Notes (Signed)
Subjective: Tolerating full liquids without nausea or emesis. She has finished her entire breakfast tray today. Having bowel movements. Still on low dose neo (10)  this Am but was off it most of the night.    Objective: Vital signs in last 24 hours: Temp:  [98.5 F (36.9 C)-99.7 F (37.6 C)] 98.8 F (37.1 C) (01/07 0300) Pulse Rate:  [61-81] 75 (01/07 0730) Resp:  [16-30] 20 (01/07 0730) BP: (72-135)/(34-75) 77/54 (01/07 0730) SpO2:  [84 %-100 %] 84 % (01/07 0730) Last BM Date: 07/20/16 240 PO 150 IV 3x urine recorded BM x 2 Tm 99.7  Intake/Output from previous day: 01/06 0701 - 01/07 0700 In: 600 [P.O.:240; I.V.:210; IV Piggyback:150] Out: -  Intake/Output this shift: No intake/output data recorded.  General appearance: alert, cooperative, no distress and talks non stop Resp: clear to auscultation bilaterally and no wheezing few rales, down some in base, I think, she continue to talk GI: soft, nontender, mildy distended, few BS, having loose stools  Lab Results:   Recent Labs  07/20/16 0507 07/21/16 0345  WBC 13.5* 14.1*  HGB 9.4* 9.2*  HCT 27.6* 26.2*  PLT 205 211    BMET  Recent Labs  07/20/16 0507 07/21/16 0345  NA 134* 134*  K 3.8 3.8  CL 107 104  CO2 20* 23  GLUCOSE 93 103*  BUN 13 13  CREATININE 0.45 0.50  CALCIUM 7.4* 7.6*   PT/INR No results for input(s): LABPROT, INR in the last 72 hours.   Recent Labs Lab 07/16/16 1644 07/18/16 1020  AST 26 18  ALT 14 15  ALKPHOS 61 51  BILITOT 0.9 0.8  PROT 5.6* 4.2*  ALBUMIN 2.8* 2.0*     Lipase     Component Value Date/Time   LIPASE 15 06/27/2016 1310     Studies/Results: Dg Abd Portable 1v-small Bowel Obstruction Protocol-24 Hr Delay  Result Date: 07/19/2016 CLINICAL DATA:  71 year old female undergoing small bowel follow-through. 24 hour delayed film. EXAM: PORTABLE ABDOMEN - 1 VIEW COMPARISON:  Most recent prior radiograph 07/18/2016 FINDINGS: Continued distal progression of  ingested oral contrast material. All of the contrast is now within the colon. The sigmoid colon is redundant. Scattered colonic diverticula are present. Gas can be seen within several loops of small bowel. A nasogastric tube is present with the tip in the stomach. Multilevel degenerative disc disease throughout the spine with dextroconvex and rotary scoliosis. The bones appear osteopenic. IMPRESSION: Continued distal progression of oral contrast material. All contrast is now within the colon. Mild colonic diverticulosis. Electronically Signed   By: Jacqulynn Cadet M.D.   On: 07/19/2016 09:40    Medications: . chlorhexidine  15 mL Mouth Rinse BID  . feeding supplement  1 Container Oral TID BM  . mouth rinse  15 mL Mouth Rinse q12n4p  . midodrine  5 mg Oral TID WC  . multivitamin  15 mL Oral Daily  . pantoprazole (PROTONIX) IV  40 mg Intravenous Q12H  . piperacillin-tazobactam (ZOSYN)  IV  3.375 g Intravenous Q8H   . phenylephrine (NEO-SYNEPHRINE) Adult infusion 10 mcg/min (07/21/16 [id number])  Urine culture - MULTIPLE SPECIES PRESENT, current specimen;  Klebsiella on 06/27/16  Assessment/Plan Sepsis/Hypotension - Off Neo all night but back on just 10 this morning. Does not seem to be doing much for her, seems an echo is planned per CCM- if no overt fluid overload perhaps she would benefit from just a little fluid No MI, most likely some demand ischemia - trop normalized Possible  pneumonia -  IV zosyn, defer to CCM Severe anemia -transfused 2 units PRBC 07/17/16, hgb stable today Hypokalemia - resolved Hypocalcemia/low phos SBO by CT - Hx of hysterectomy/appendectomy - Contrast to the rectum on CT, tolerating clears and having bm. Hx Tobacco use - none for 3 days prior to admit Malnutrition - prealbumin 7.8 FEN: IV fluids ID: wbc up slightly, on zosyn DVT: SCD - OK to start anticoagulation from our standpoint  Plan:  Will advance to reg diet. Mobilize/out of bed as tolerated. Will continue  to follow.        LOS: 5 days    Clovis Riley MD 07/21/2016

## 2016-07-21 NOTE — Progress Notes (Signed)
PULMONARY / CRITICAL CARE MEDICINE   Name: Ashley Howard MRN: [id number] DOB: Aug 26, 1945    ADMISSION DATE:  07/16/2016 CONSULTATION DATE:  07/17/2016  REFERRING MD:  Dr. Harlow Asa   CHIEF COMPLAINT:  Abdominal pain, nausea vomiting  BRIEF: 71 y/o female with GERD and a hiatal hernia came to [facility] on 1/2 with a small bowel obstruction which lead to aspiration pneumonia and septic shock.  Chronically ill appearing, malnourished.  She and husband live in poor living conditions.     SUBJECTIVE:  denies nausea , no abd pain oob to chair Remains On low dose neo gtt   VITAL SIGNS: BP (!) 90/51   Pulse 78   Temp 99.9 F (37.7 C) (Oral)   Resp (!) 22   Ht 4\' 11"  (1.499 m)   Wt 44.7 kg (98 lb 8.7 oz)   SpO2 94%   BMI 19.90 kg/m   HEMODYNAMICS:    VENTILATOR SETTINGS:    INTAKE / OUTPUT: I/O last 3 completed shifts: In: 965 [P.O.:240; I.V.:475; IV Piggyback:250] Out: -   PHYSICAL EXAMINATION: General:  No distress in bed Neuro:  Awake, alert, oriented 4, moves all 4 extremities HEENT:  Normocephalic atraumatic Cardiovascular:  Regular rate and rhythm no murmurs gallops or rubs Lungs:   clear, congested sounding cough Abdomen:  Bowel sounds positive, non tender Musculoskeletal:  Normal bulk and tone Skin:  No rash or skin breakdown  LABS:  BMET  Recent Labs Lab 07/19/16 0508 07/20/16 0507 07/21/16 0345  NA 138 134* 134*  K 4.0 3.8 3.8  CL 108 107 104  CO2 23 20* 23  BUN 15 13 13   CREATININE 0.66 0.45 0.50  GLUCOSE 92 93 103*    Electrolytes  Recent Labs Lab 07/18/16 1020 07/19/16 0508 07/20/16 0507 07/21/16 0345  CALCIUM 7.3* 7.6* 7.4* 7.6*  MG 1.6* 1.9  --  1.7  PHOS  --  1.5*  --  2.0*    CBC  Recent Labs Lab 07/19/16 0508 07/20/16 0507 07/21/16 0345  WBC 12.0* 13.5* 14.1*  HGB 9.8* 9.4* 9.2*  HCT 27.5* 27.6* 26.2*  PLT 187 205 211    Coag's  Recent Labs Lab 07/16/16 1644  INR 1.06    Sepsis Markers  Recent Labs Lab  07/16/16 1817 07/17/16 0347 07/17/16 0712 07/17/16 1100 07/18/16 0511 07/19/16 0508  LATICACIDVEN 2.34* 1.1 1.2  --   --   --   PROCALCITON  --   --   --  0.14 29.96 36.70    ABG No results for input(s): PHART, PCO2ART, PO2ART in the last 168 hours.  Liver Enzymes  Recent Labs Lab 07/16/16 1644 07/18/16 1020  AST 26 18  ALT 14 15  ALKPHOS 61 51  BILITOT 0.9 0.8  ALBUMIN 2.8* 2.0*    Cardiac Enzymes  Recent Labs Lab 07/19/16 1635 07/19/16 2230 07/20/16 0507  TROPONINI 0.03* 0.03* <0.03    Glucose No results for input(s): GLUCAP in the last 168 hours.  Imaging No results found. 07/17/2016 chest x-ray images personally reviewed showing diffuse interstitial edema bilaterally, no clear consolidation  1/5 CXR > persistent R>L airspace disease and effusion on R  STUDIES:  07/16/2016 CT abdomen pelvis with contrast: Small bowel obstruction without clear transition point, there is fluid in the stomach and esophagus.  CULTURES: 1/3 Blood culture> ng  ANTIBIOTICS: Zosyn 07/17/2016 >  Vanc 07/17/2016 > 1/5  SIGNIFICANT EVENTS:   LINES/TUBES:   DISCUSSION: 71 year old female with a past medical history significant for hiatal  hernia was admitted 1/2 for a small bowel obstruction which lead to aspiration pneumonia and septic shock.  At baseline appears chronically malnourised, lives in poor living conditions.  ASSESSMENT / PLAN:  PULMONARY A: Aspiration pneumonia, doubt pulmonary edema based on physical exam P:   Out of bed, incentive spirometry   CARDIOVASCULAR A:  Septic shock Doubt CHF P:  Tele neosynephrine for MAP > 55, SBP 85 Added midodrine tid 1/5 pro-BNP slight elevated , gentle diuresis once off neo -trying to avoid more fluid check echo  RENAL  A:   Hypokalemia Hypophos -repleted P:   Monitor BMET and UOP Replace electrolytes as needed  GASTROINTESTINAL A:   Small bowel obstruction UGI Bleed -FOB +, no frank bleeding  Protein  calorie malnutrition P:   advance diet per sugery D/C NSAIDs PPI BID ? Need for EGD at some point   HEMATOLOGIC  A:   Anemia P:  Monitor for bleeding Transfuse PRBC for goal Hgb > 7gm/dL Follow CBC Hold heparin, ct SCDs  INFECTIOUS A:   Sepsis> Aspiration pneumonia P:   Stopped vanc Continue zosyn x 5ds total until 1/8  ENDOCRINE A:   No acute issues P:   Monitor glucose  NEUROLOGIC A:   Deconditioning P:   PT, mobilise   FAMILY  - Updates: pt    My cc time 31 minutes  Kara Mead MD. FCCP. [place] [facility] Pager [phone] If no response call [phone]    07/21/2016, 10:47 AM

## 2016-07-22 ENCOUNTER — Inpatient Hospital Stay (HOSPITAL_COMMUNITY): Payer: Medicare Other

## 2016-07-22 DIAGNOSIS — R6521 Severe sepsis with septic shock: Secondary | ICD-10-CM

## 2016-07-22 DIAGNOSIS — R579 Shock, unspecified: Secondary | ICD-10-CM

## 2016-07-22 DIAGNOSIS — A419 Sepsis, unspecified organism: Secondary | ICD-10-CM

## 2016-07-22 LAB — CULTURE, BLOOD (ROUTINE X 2)
CULTURE: NO GROWTH
CULTURE: NO GROWTH

## 2016-07-22 LAB — ECHOCARDIOGRAM COMPLETE
Height: 59 in
WEIGHTICAEL: 1576.73 [oz_av]

## 2016-07-22 MED ORDER — ENSURE ENLIVE PO LIQD: 237.0000 mL | Freq: Three times a day (TID) | ORAL | Status: DC

## 2016-07-22 MED ORDER — ENSURE ENLIVE PO LIQD
237.0000 mL | Freq: Two times a day (BID) | ORAL | Status: DC
  Administered 2016-07-22 – 2016-07-24 (×4): 237 mL via ORAL

## 2016-07-22 MED ORDER — ADULT MULTIVITAMIN W/MINERALS CH
1.0000 | ORAL_TABLET | Freq: Every day | ORAL | Status: DC
  Administered 2016-07-23 – 2016-07-24 (×2): 1 via ORAL
  Filled 2016-07-22 (×2): qty 1
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
## 2016-07-22 NOTE — Progress Notes (Signed)
[facility]  [address] [place], [place] [address] Phone: [phone] FAX: [place] [phone] 11-04-1945    Problem List:   Principal Problem:   Small bowel obstruction Active Problems:   Dehydration   Protein-calorie malnutrition, severe   Septic shock ([place])           Assessment  Ileus resolved.  No evidence of bowel obstruction.   Plan:  -Solid diet as tolerated.  Fiber bowel regimen.  -VTE prophylaxis- SCDs, etc -mobilize as tolerated to help recovery  Will sign off for now.  Call  with questions.  Adin Hector, M.D., F.A.C.S. Gastrointestinal and Minimally Invasive Surgery [facility] [address], [address] [place], [place] [address] [phone] Main / Paging   07/22/2016  CARE TEAM:  PCP: Maggie Font, MD  Outpatient Care Team: Patient Care Team: Iona Beard, MD as PCP - General (Family Medicine)  Inpatient Treatment Team: Treatment Team: Attending Provider: Juanito Doom, MD; Rounding Team: Md Pccm, MD; Registered Nurse: Claria Dice, RN; Technician: Linton Rump, NT; Rounding Team: Jackelyn Knife, MD; Registered Nurse: Carl Best, RN  Subjective:  Tolerates solid foods.  Off pressors.  Feeling much better.  Complaining of supplemental shake is "too fruity".  Wishes vanilla shake.  No nausea or vomiting.  Hungry.  Objective:  Vital signs:  Vitals:   07/22/16 1200 07/22/16 1355 07/22/16 1502 07/22/16 1600  BP: 103/75 (!) 112/52  (!) 85/54  Pulse:      Resp: 20 (!) 21 (!) 27 (!) 28  Temp: 98.4 F (36.9 C) (!) 100.7 F (38.2 C)    TempSrc: Oral Oral    SpO2: 95%  94% 95%  Weight:      Height:        Last BM Date: 07/21/16  Intake/Output   Yesterday:  01/07 0701 - 01/08 0700 In: 461.8 [P.O.:120; I.V.:191.8; IV Piggyback:150] Out: -  This shift:  Total I/O In: 590 [P.O.:540; IV Piggyback:50] Out: 700  [Urine:700]  Bowel function:  Flatus: YES  BM:  YES  Drain: (No drain)   Physical Exam:  General: Pt awake/alert/oriented x4 in No acute distress Eyes: PERRL, normal EOM.  Sclera clear.  No icterus Neuro: CN II-XII intact w/o focal sensory/motor deficits. Lymph: No head/neck/groin lymphadenopathy Psych:  No delerium/psychosis/paranoia HENT: Normocephalic, Mucus membranes moist.  No thrush Neck: Supple, No tracheal deviation Chest: No chest wall pain w good excursion CV:  Pulses intact.  Regular rhythm MS: Normal AROM mjr joints.  No obvious deformity Abdomen: Soft.  Mildy distended.  Nontender.  No evidence of peritonitis.  No incarcerated hernias. Ext:  SCDs BLE.  No mjr edema.  No cyanosis Skin: No petechiae / purpura  Results:   Labs: Results for orders placed or performed during the hospital encounter of 07/16/16 (from the past 48 hour(s))  CBC     Status: Abnormal   Collection Time: 07/21/16  3:45 AM  Result Value Ref Range   WBC 14.1 (H) 4.0 - 10.5 K/uL   RBC 2.76 (L) 3.87 - 5.11 MIL/uL   Hemoglobin 9.2 (L) 12.0 - 15.0 g/dL   HCT 26.2 (L) 36.0 - 46.0 %   MCV 94.9 78.0 - 100.0 fL   MCH 33.3 26.0 - 34.0 pg   MCHC 35.1 30.0 - 36.0 g/dL   RDW 15.6 (H) 11.5 - 15.5 %   Platelets 211 150 - 400 K/uL  Basic metabolic  panel     Status: Abnormal   Collection Time: 07/21/16  3:45 AM  Result Value Ref Range   Sodium 134 (L) 135 - 145 mmol/L   Potassium 3.8 3.5 - 5.1 mmol/L   Chloride 104 101 - 111 mmol/L   CO2 23 22 - 32 mmol/L   Glucose, Bld 103 (H) 65 - 99 mg/dL   BUN 13 6 - 20 mg/dL   Creatinine, Ser 0.50 0.44 - 1.00 mg/dL   Calcium 7.6 (L) 8.9 - 10.3 mg/dL   GFR calc non Af Amer >60 >60 mL/min   GFR calc Af Amer >60 >60 mL/min    Comment: (NOTE) The eGFR has been calculated using the CKD EPI equation. This calculation has not been validated in all clinical situations. eGFR's persistently <60 mL/min signify possible Chronic Kidney Disease.    Anion gap 7 5 - 15   Magnesium     Status: None   Collection Time: 07/21/16  3:45 AM  Result Value Ref Range   Magnesium 1.7 1.7 - 2.4 mg/dL  Phosphorus     Status: Abnormal   Collection Time: 07/21/16  3:45 AM  Result Value Ref Range   Phosphorus 2.0 (L) 2.5 - 4.6 mg/dL    Imaging / Studies: No results found.  Medications / Allergies: per chart  Antibiotics: Anti-infectives    Start     Dose/Rate Route Frequency Ordered Stop   07/17/16 1200  piperacillin-tazobactam (ZOSYN) IVPB 3.375 g     3.375 g 12.5 mL/hr over 240 Minutes Intravenous Every 8 hours 07/17/16 1049 07/22/16 2359   07/17/16 1200  vancomycin (VANCOCIN) IVPB 1000 mg/200 mL premix  Status:  Discontinued     1,000 mg 200 mL/hr over 60 Minutes Intravenous Every 24 hours 07/17/16 1056 07/19/16 0919        Note: Portions of this report may have been transcribed using voice recognition software. Every effort was made to ensure accuracy; however, inadvertent computerized transcription errors may be present.   Any transcriptional errors that result from this process are unintentional.     Adin Hector, M.D., F.A.C.S. Gastrointestinal and Minimally Invasive Surgery [facility] [address], [place], [place] [address] [phone] Main / Paging   07/22/2016

## 2016-07-22 NOTE — Progress Notes (Signed)
Physical Therapy Treatment Patient Details Name: Ashley Howard MRN: [id number] DOB: 1945-10-01 Today's Date: 07/22/2016    History of Present Illness Pt admitted through ED with SBO, septic shock and Asp PNA.  Pt with hx of peripheral neuropathy and L foot 1rst ray amputation    PT Comments    The patient is able to mobilize to the recliner today,  BP 88/52 after transfer,. Unable to ambulate today with DOE. Continue PT while in acute care.  Follow Up Recommendations  SNF     Equipment Recommendations  None recommended by PT    Recommendations for Other Services       Precautions / Restrictions Restrictions Weight Bearing Restrictions: No    Mobility  Bed Mobility Overal bed mobility: Needs Assistance Bed Mobility: Supine to Sit;Rolling Rolling: Supervision   Supine to sit: Min assist     General bed mobility comments: rolls with rail, extra time to push self to sitting upright  Transfers Overall transfer level: Needs assistance Equipment used: 1 person hand held assist Transfers: Sit to/from [employer] transfer comment: bear hug transfer  pivots today. BP 8/52, HR 94, Sats on 4 l >94%  Ambulation/Gait             General Gait Details: not tested, more dyspneic today   Stairs            Wheelchair Mobility    Modified Rankin (Stroke Patients Only)       Balance                                    Cognition Arousal/Alertness: Awake/alert Behavior During Therapy: WFL for tasks assessed/performed                        Exercises      General Comments        Pertinent Vitals/Pain Pain Assessment: Faces Faces Pain Scale: Hurts little more Pain Location: legs, neuropathy Pain Descriptors / Indicators: Discomfort Pain Intervention(s): Monitored during session    Home Living                      Prior Function            PT Goals (current goals can now be  found in the care plan section) Progress towards PT goals: Progressing toward goals    Frequency    Min 3X/week      PT Plan Current plan remains appropriate    Co-evaluation             End of Session Equipment Utilized During Treatment: Oxygen Activity Tolerance: Patient tolerated treatment well;Treatment limited secondary to medical complications (Comment) Patient left: in chair;with call bell/phone within reach;with chair alarm set     Time: [id number] PT Time Calculation (min) (ACUTE ONLY): 25 min  Charges:  $Therapeutic Activity: 23-37 mins                    G Codes:      [name] 07/22/2016, 10:11 AM Tresa Endo PT [phone]

## 2016-07-22 NOTE — Progress Notes (Signed)
  Echocardiogram 2D Echocardiogram has been performed.  Tresa Res 07/22/2016, 3:55 PM

## 2016-07-22 NOTE — Progress Notes (Signed)
Nutrition Follow-up  DOCUMENTATION CODES:   Severe malnutrition in context of acute illness/injury  INTERVENTION:  - Continue to encourage PO intakes.  - Continue Ensure Enlive BID, each supplement provides 350 kcal and 20 grams of protein - RD will continue to monitor for additional nutrition-related needs.  NUTRITION DIAGNOSIS:   Inadequate oral intake related to inability to eat as evidenced by NPO status. -diet advanced to Regular yesterday AM and beginning to eat better.  GOAL:   Patient will meet greater than or equal to 90% of their needs -not fully met at this time.   MONITOR:   PO intake, Supplement acceptance, Weight trends, Labs, I & O's  ASSESSMENT:   71 yo WF brought to the ER by EMS with signs and symptoms of SBO.  Patient and husband found on floor in unheated home under a blanket.  Patient states she has had "constipation" for a long time.  Nausea and emesis began 3 days ago.  In ER, elevated WBC 17K, elevated lactate level.  CT scan with markedly distended stomach and dilated small bowel consistent with obstruction but no transition point seen.  1/8 Diet advanced from CLD to FLD 1/6 @ 9021 and from FLD to Regular 1/7 @ 0736. Pt with good appetite with diet advancement and denies any discomfort with eating. She consumed 75% of dinner last night and 80% of breakfast this AM. Boost Breeze d/c'ed and Ensure Enlive was ordered BID earlier this AM. No new weight since 07/17/16.  Medications reviewed; daily multivitamin with minerals, PRN Zofran, 40 mg IV Protonix BID. Labs reviewed; Na: 134 mmol/L, Ca: 7.6 mg/dL, Phos: 2 mg/dL.    1/5 - Diet advanced to CLD earlier this AM.  - Pt has had sips of grape juice and apple juice and denies nausea or abdominal discomfort.  - RN in pt's room to remove NGT at this time.  - Pt states she is hungry and that she would like to have chicken broth and jello today.  - Will order Boost Breeze while on CLD to assist in meeting  estimated protein need.  - Pt confirms information about abdominal discomfort and poor appetite PTA.  - She states that her appetite fluctuated day-to-day and states that she has had a large amount of stress over the past few months.  - She also reports lack of taste for several months and that this occurs "when I have excess acid in my stomach."  - She also reports use of antibiotics during that time.  - Physical assessment shows severe muscle and severe fat wasting.  - Pt meets criteria for severe malnutrition based on wasting and weight loss.  - Weight +1.6 kg since admission which is likely fluid related given previous IVF.    1/3 - Pt has been NPO since admission and unable to meet needs.  - NGT placed in ED yesterday. - Per chart review, pt had 3-4 weeks of anorexia followed by 2-3 days of N/V with associated mild abdominal pain.  - Emesis is described as black tarry liquid.  - Unable to talk with pt at this time d/t several care providers working with/talking with her Data processing manager, Surgery PA, and RN).  - Unable to perform physical assessment but will do so at follow-up.  - Per chart review, pt has lost 8 lbs (7.5% body weight) in the past 3 months which is significant for time frame.  - Pt likely meets criteria for malnutrition but unable to confirm at this time.  IVF:D5-NS-20 mEq KCl @ 50 mL/hr (204 kcal).  Drip:Neo @ 80 mcg/min.     Diet Order:  Diet Heart Room service appropriate? Yes; Fluid consistency: Thin  Skin:  Reviewed, no issues  Last BM:  1/7  Height:   Ht Readings from Last 1 Encounters:  07/17/16 _0  (1.499 m)    Weight:   Wt Readings from Last 1 Encounters:  07/17/16 98 lb 8.7 oz (44.7 kg)    Ideal Body Weight:  42.91 kg  BMI:  Body mass index is 19.9 kg/m.  Estimated Nutritional Needs:   Kcal:  1120-1340 (25-30 kcal/kg)  Protein:  45-55 grams (1-1.2 grams/kg)  Fluid:  >/= 1.5 L/day  EDUCATION NEEDS:   No education needs  identified at this time    Jarome Matin, MS, RD, LDN, CNSC Inpatient Clinical Dietitian Pager # [phone] After hours/weekend pager # [phone]

## 2016-07-22 NOTE — Progress Notes (Signed)
O2 sat decreased to 85 with pt sleeping. O2 increased to 5l nasl cannula. Temp 100.7 ,tylenol 650mg m given as ordered.

## 2016-07-23 LAB — BASIC METABOLIC PANEL
Anion gap: 7 (ref 5–15)
BUN: 13 mg/dL (ref 6–20)
CHLORIDE: 99 mmol/L — AB (ref 101–111)
CO2: 27 mmol/L (ref 22–32)
CREATININE: 0.58 mg/dL (ref 0.44–1.00)
Calcium: 8 mg/dL — ABNORMAL LOW (ref 8.9–10.3)
GFR calc non Af Amer: >60 mL/min (ref >60)
Glucose, Bld: 100 mg/dL — ABNORMAL HIGH (ref 65–99)
POTASSIUM: 3.7 mmol/L (ref 3.5–5.1)
SODIUM: 133 mmol/L — AB (ref 135–145)

## 2016-07-23 LAB — CBC
HCT: 27.3 % — ABNORMAL LOW (ref 36.0–46.0)
HEMOGLOBIN: 9.3 g/dL — AB (ref 12.0–15.0)
MCH: 32.4 pg (ref 26.0–34.0)
MCHC: 34.1 g/dL (ref 30.0–36.0)
MCV: 95.1 fL (ref 78.0–100.0)
Platelets: 304 10*3/uL (ref 150–400)
RBC: 2.87 MIL/uL — ABNORMAL LOW (ref 3.87–5.11)
RDW: 14.6 % (ref 11.5–15.5)
WBC: 11.2 10*3/uL — ABNORMAL HIGH (ref 4.0–10.5)

## 2016-07-23 LAB — PHOSPHORUS: PHOSPHORUS: 2.3 mg/dL — AB (ref 2.5–4.6)

## 2016-07-23 LAB — MAGNESIUM: MAGNESIUM: 1.7 mg/dL (ref 1.7–2.4)

## 2016-07-23 MED ORDER — PANTOPRAZOLE SODIUM 40 MG PO TBEC
80.0000 mg | DELAYED_RELEASE_TABLET | Freq: Two times a day (BID) | ORAL | Status: DC
  Administered 2016-07-23 – 2016-07-24 (×3): 80 mg via ORAL
  Filled 2016-07-23 (×3): qty 2

## 2016-07-23 NOTE — Evaluation (Signed)
Clinical/Bedside Swallow Evaluation Patient Details  Name: Ashley Howard MRN: [id number] Date of Birth: 07-24-1945  Today's Date: 07/23/2016 Time: SLP Start Time (ACUTE ONLY): 32 SLP Stop Time (ACUTE ONLY): 1555 SLP Time Calculation (min) (ACUTE ONLY): 15 min  Past Medical History:  Past Medical History:  Diagnosis Date  . Arthritis   . Bowel incontinence   . Fluid retention in legs    wears compression stocking R leg  . Gallstones   . GERD (gastroesophageal reflux disease)   . H/O hiatal hernia   . Incontinence of urine    wears adult briefs  . PONV (postoperative nausea and vomiting)   . Scoliosis   . Seasonal allergies   . Shortness of breath    with pain  . Weight loss, unintentional    40 lbs in last year   Past Surgical History:  Past Surgical History:  Procedure Laterality Date  . ABDOMINAL HYSTERECTOMY  1975  . adenoidectomy    . AMPUTATION  11/07/2011   Procedure: AMPUTATION RAY;  Surgeon: Newt Minion, MD;  Location: [place];  Service: Orthopedics;  Laterality: Left;  Left Foot 1st Ray Amputation  . APPENDECTOMY    . BLADDER REPAIR    . BREAST SURGERY  1989   lumpectomy bilat  . DOBUTAMINE STRESS ECHO    . EYE SURGERY     bilat cataracts  . KNEE ARTHROSCOPY     left  . TONSILLECTOMY     HPI:  71 y.o.femalewho presented with SBO resulting in aspiration pneumonia and resultant septic shock. She was admitted to [facility] and was given neo-synephrine for pressure support and zosyn for the aspiration. Shock has resolved and pressors weaned. She's completed 5 days of zosyn, and was transferred to the hospitalists service 07/23/2016. Regular diet started per general surgery 1/8.  Dx includes protein cal malnutrition.    Assessment / Plan / Recommendation Clinical Impression  Pt presents with normal oropharyngeal swallow function with adequate mastication, brisk swallow response, adequate respiratory/swallowing reciprocity, no overt s/s of aspiration.  Recommend pt  continue current regular consistency diet with thin liquids - no SLP f/u warranted.  Will sign off.     Aspiration Risk       Diet Recommendation   regular solids, thin liquids.  Medication Administration: Whole meds with liquid    Other  Recommendations Oral Care Recommendations: Oral care BID   Follow up Recommendations None      Frequency and Duration            Prognosis        Swallow Study   General Date of Onset: 07/16/16 HPI: 71 y.o.femalewho presented with SBO resulting in aspiration pneumonia and resultant septic shock. She was admitted to [facility] and was given neo-synephrine for pressure support and zosyn for the aspiration. Shock has resolved and pressors weaned. She's completed 5 days of zosyn, and was transferred to the hospitalists service 07/23/2016. Regular diet started per general surgery 1/8.  Dx includes protein cal malnutrition.  Type of Study: Bedside Swallow Evaluation Previous Swallow Assessment: no Diet Prior to this Study: Regular;Thin liquids Temperature Spikes Noted: Yes Respiratory Status: Nasal cannula History of Recent Intubation: No Behavior/Cognition: Alert;Cooperative Oral Cavity Assessment: Within Functional Limits Oral Care Completed by SLP: No Oral Cavity - Dentition: Poor condition Vision: Functional for self-feeding Self-Feeding Abilities: Able to feed self Patient Positioning: Upright in bed Baseline Vocal Quality: Normal Volitional Cough: Strong Volitional Swallow: Able to elicit    Oral/Motor/Sensory Function Overall  Oral Motor/Sensory Function: Within functional limits   Ice Chips Ice chips: Within functional limits   Thin Liquid Thin Liquid: Within functional limits Presentation: Cup;Straw    Nectar Thick Nectar Thick Liquid: Not tested   Honey Thick Honey Thick Liquid: Not tested   Puree Puree: Within functional limits Presentation: Self Fed;Spoon   Solid   GO   Solid: Within functional limits        Juan Quam Laurice 07/23/2016,3:55 PM   Estill Bamberg L. Tivis Ringer, [place] CCC/SLP Pager [phone]
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
## 2016-07-23 NOTE — Progress Notes (Signed)
PT Cancellation Note  Patient Details Name: Ashley Howard MRN: [id number] DOB: 1946-04-29   Cancelled Treatment:    Reason Eval/Treat Not Completed: Fatigue/lethargy limiting ability to participate (patient states that she just got back to bed. check back another time. Plans to go to SNF.)   [name] 07/23/2016, 2:18 PM Tresa Endo PT [phone]

## 2016-07-23 NOTE — Progress Notes (Signed)
CSW assisting with d/c planning. Met with pt at bedside. Pt is requesting [facility] for SNF placement. Pt does not want to go to [facility] where her husband is residing. Pt requested CSW speak with her son, Randy, regarding d/c planning. CSW spoke with Randy this afternoon. He supports his mother request for a differet placement from spouse. CSW has made arrangements for pt to d/c to [facility] when stable for d/c.  CSW will continue to follow to assist with d/c planning needs.  Jamie Haidinger LCSW [phone] 

## 2016-07-24 DIAGNOSIS — Z8701 Personal history of pneumonia (recurrent): Secondary | ICD-10-CM | POA: Diagnosis not present

## 2016-07-24 DIAGNOSIS — K56699 Other intestinal obstruction unspecified as to partial versus complete obstruction: Secondary | ICD-10-CM | POA: Diagnosis not present

## 2016-07-24 DIAGNOSIS — M419 Scoliosis, unspecified: Secondary | ICD-10-CM | POA: Diagnosis not present

## 2016-07-24 DIAGNOSIS — R140 Abdominal distension (gaseous): Secondary | ICD-10-CM | POA: Diagnosis not present

## 2016-07-24 DIAGNOSIS — F17210 Nicotine dependence, cigarettes, uncomplicated: Secondary | ICD-10-CM | POA: Diagnosis present

## 2016-07-24 DIAGNOSIS — D649 Anemia, unspecified: Secondary | ICD-10-CM | POA: Diagnosis not present

## 2016-07-24 DIAGNOSIS — Z8249 Family history of ischemic heart disease and other diseases of the circulatory system: Secondary | ICD-10-CM | POA: Diagnosis not present

## 2016-07-24 DIAGNOSIS — Z4682 Encounter for fitting and adjustment of non-vascular catheter: Secondary | ICD-10-CM | POA: Diagnosis not present

## 2016-07-24 DIAGNOSIS — R488 Other symbolic dysfunctions: Secondary | ICD-10-CM | POA: Diagnosis not present

## 2016-07-24 DIAGNOSIS — M6281 Muscle weakness (generalized): Secondary | ICD-10-CM | POA: Diagnosis not present

## 2016-07-24 DIAGNOSIS — D62 Acute posthemorrhagic anemia: Secondary | ICD-10-CM | POA: Diagnosis not present

## 2016-07-24 DIAGNOSIS — M792 Neuralgia and neuritis, unspecified: Secondary | ICD-10-CM | POA: Diagnosis not present

## 2016-07-24 DIAGNOSIS — E785 Hyperlipidemia, unspecified: Secondary | ICD-10-CM | POA: Diagnosis not present

## 2016-07-24 DIAGNOSIS — K56609 Unspecified intestinal obstruction, unspecified as to partial versus complete obstruction: Secondary | ICD-10-CM | POA: Diagnosis not present

## 2016-07-24 DIAGNOSIS — K219 Gastro-esophageal reflux disease without esophagitis: Secondary | ICD-10-CM | POA: Diagnosis not present

## 2016-07-24 DIAGNOSIS — I5032 Chronic diastolic (congestive) heart failure: Secondary | ICD-10-CM | POA: Diagnosis not present

## 2016-07-24 DIAGNOSIS — R739 Hyperglycemia, unspecified: Secondary | ICD-10-CM | POA: Diagnosis not present

## 2016-07-24 DIAGNOSIS — R6521 Severe sepsis with septic shock: Secondary | ICD-10-CM | POA: Diagnosis not present

## 2016-07-24 DIAGNOSIS — Z01818 Encounter for other preprocedural examination: Secondary | ICD-10-CM | POA: Diagnosis not present

## 2016-07-24 DIAGNOSIS — K8064 Calculus of gallbladder and bile duct with chronic cholecystitis without obstruction: Secondary | ICD-10-CM | POA: Diagnosis not present

## 2016-07-24 DIAGNOSIS — R109 Unspecified abdominal pain: Secondary | ICD-10-CM | POA: Diagnosis not present

## 2016-07-24 DIAGNOSIS — K5900 Constipation, unspecified: Secondary | ICD-10-CM | POA: Diagnosis not present

## 2016-07-24 DIAGNOSIS — Z6822 Body mass index (BMI) 22.0-22.9, adult: Secondary | ICD-10-CM | POA: Diagnosis not present

## 2016-07-24 DIAGNOSIS — K8050 Calculus of bile duct without cholangitis or cholecystitis without obstruction: Secondary | ICD-10-CM | POA: Diagnosis not present

## 2016-07-24 DIAGNOSIS — K5669 Other partial intestinal obstruction: Secondary | ICD-10-CM | POA: Diagnosis not present

## 2016-07-24 DIAGNOSIS — A419 Sepsis, unspecified organism: Secondary | ICD-10-CM | POA: Diagnosis not present

## 2016-07-24 DIAGNOSIS — E43 Unspecified severe protein-calorie malnutrition: Secondary | ICD-10-CM | POA: Diagnosis not present

## 2016-07-24 DIAGNOSIS — Z90710 Acquired absence of both cervix and uterus: Secondary | ICD-10-CM | POA: Diagnosis not present

## 2016-07-24 DIAGNOSIS — R32 Unspecified urinary incontinence: Secondary | ICD-10-CM | POA: Diagnosis present

## 2016-07-24 DIAGNOSIS — K5651 Intestinal adhesions [bands], with partial obstruction: Secondary | ICD-10-CM | POA: Diagnosis not present

## 2016-07-24 DIAGNOSIS — R600 Localized edema: Secondary | ICD-10-CM | POA: Diagnosis not present

## 2016-07-24 MED ORDER — PANTOPRAZOLE SODIUM 40 MG PO TBEC: 40.0000 mg | DELAYED_RELEASE_TABLET | Freq: Two times a day (BID) | ORAL | Status: DC

## 2016-07-24 NOTE — Clinical Social Work Placement (Signed)
Patient is set to discharge to [facility] today. Patient & son, Ashley Howard made aware. Discharge packet given to RN, Tess. [facility] called for transport.     Raynaldo Opitz, [facility] Clinical Social Worker cell #: [phone]    CLINICAL SOCIAL WORK PLACEMENT  NOTE  Date:  07/24/2016  Patient Details  Name: Ashley Howard MRN: [id number] Date of Birth: 08-29-1945  Clinical Social Work is seeking post-discharge placement for this patient at the [place] level of care (*CSW will initial, date and re-position this form in  chart as items are completed):  Yes   Patient/family provided with [facility] Work Department's list of facilities offering this level of care within the geographic area requested by the patient (or if unable, by the patient's family).  Yes   Patient/family informed of their freedom to choose among providers that offer the needed level of care, that participate in Medicare, Medicaid or managed care program needed by the patient, have an available bed and are willing to accept the patient.  Yes   Patient/family informed of [facility]'s ownership interest in [facility] and [facility], as well as of the fact that they are under no obligation to receive care at these facilities.  PASRR submitted to [facility] on 07/19/16     PASRR number received on 07/19/16     Existing PASRR number confirmed on       FL2 transmitted to all facilities in geographic area requested by pt/family on 07/19/16     FL2 transmitted to all facilities within larger geographic area on       Patient informed that his/her managed care company has contracts with or will negotiate with certain facilities, including the following:        Yes   Patient/family informed of bed offers received.  Patient chooses bed at [facility] recommends and patient chooses bed at      Patient to be transferred to [facility] on 07/24/16.  Patient to be transferred to facility by [facility]     Patient family notified on 07/24/16 of transfer.  Name of family member notified:  patient's son, Ashley Howard aware     PHYSICIAN       Additional Comment:    _______________________________________________ Standley Brooking, LCSW 07/24/2016, 3:50 PM

## 2016-07-24 NOTE — Progress Notes (Signed)
I agree with the previous nurses's assessment
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
## 2016-07-26 ENCOUNTER — Non-Acute Institutional Stay (SKILLED_NURSING_FACILITY): Payer: Medicare Other | Admitting: Nurse Practitioner

## 2016-07-26 ENCOUNTER — Encounter: Payer: Self-pay | Admitting: Nurse Practitioner

## 2016-07-26 DIAGNOSIS — D62 Acute posthemorrhagic anemia: Secondary | ICD-10-CM | POA: Diagnosis not present

## 2016-07-26 DIAGNOSIS — E43 Unspecified severe protein-calorie malnutrition: Secondary | ICD-10-CM

## 2016-07-26 DIAGNOSIS — K56609 Unspecified intestinal obstruction, unspecified as to partial versus complete obstruction: Secondary | ICD-10-CM

## 2016-07-26 DIAGNOSIS — R6521 Severe sepsis with septic shock: Secondary | ICD-10-CM | POA: Diagnosis not present

## 2016-07-26 DIAGNOSIS — A419 Sepsis, unspecified organism: Secondary | ICD-10-CM | POA: Diagnosis not present

## 2016-07-26 DIAGNOSIS — K5900 Constipation, unspecified: Secondary | ICD-10-CM | POA: Diagnosis not present

## 2016-07-26 NOTE — Progress Notes (Signed)
Nursing Home Location: [facility] Room: 111  Place of Service: SNF (31)  PCP: Maggie Font, MD  Allergies  Allergen Reactions  . Tape Other (See Comments)    Paper tape - blisters    Chief Complaint  Patient presents with  . Hospitalization Follow-up    [facility] stay from 07/16/2016 until 07/24/2016    HPI:  Patient is a 71 y.o. female seen today at [facility] to follow up hospitalization from 1/2-1/10/18. Pt was hospitalized with SBO resulting in aspiration pneumonia and resultant septic shock. She completed 5 days of zosyn for aspiration pneumonia and placed on bowel rest for SBO. On discharge was tolerating diet and having adequate BMs. Today she reports she is constipated. Pt did have positive FOBT and was placed to PPI BID.   Review of Systems:  Review of Systems  Constitutional: Positive for fatigue. Negative for activity change, appetite change and unexpected weight change.  HENT: Negative for congestion and hearing loss.   Eyes: Negative.   Respiratory: Negative for cough and shortness of breath.   Cardiovascular: Negative for chest pain, palpitations and leg swelling.  Gastrointestinal: Positive for constipation. Negative for abdominal pain, diarrhea, nausea and vomiting.  Genitourinary: Negative for difficulty urinating and dysuria.  Musculoskeletal: Negative for arthralgias and myalgias.  Skin: Negative for color change and wound.  Neurological: Positive for weakness. Negative for dizziness.  Psychiatric/Behavioral: Negative for agitation, behavioral problems and confusion.    Past Medical History:  Diagnosis Date  . Arthritis   . Bowel incontinence   . Fluid retention in legs    wears compression stocking R leg  . Gallstones   . GERD (gastroesophageal reflux disease)   . H/O hiatal hernia   . Incontinence of urine    wears adult briefs  . PONV (postoperative nausea and vomiting)   . Scoliosis   . Seasonal allergies   .  Shortness of breath    with pain  . Weight loss, unintentional    40 lbs in last year   Past Surgical History:  Procedure Laterality Date  . ABDOMINAL HYSTERECTOMY  1975  . adenoidectomy    . AMPUTATION  11/07/2011   Procedure: AMPUTATION RAY;  Surgeon: Newt Minion, MD;  Location: [facility];  Service: Orthopedics;  Laterality: Left;  Left Foot 1st Ray Amputation  . APPENDECTOMY    . BLADDER REPAIR    . BREAST SURGERY  1989   lumpectomy bilat  . DOBUTAMINE STRESS ECHO    . EYE SURGERY     bilat cataracts  . KNEE ARTHROSCOPY     left  . TONSILLECTOMY     Social History:   reports that she has been smoking Cigarettes.  She has been smoking about 1.00 pack per day. She has never used smokeless tobacco. She reports that she does not drink alcohol or use drugs.  Family History  Problem Relation Age of Onset  . Hypertension Mother     Medications: Patient's Medications  New Prescriptions   No medications on file  Previous Medications   CALCIUM CARBONATE (TUMS - DOSED IN MG ELEMENTAL CALCIUM) 500 MG CHEWABLE TABLET    Chew 1 tablet by mouth as needed for indigestion or heartburn.   DIPHENHYDRAMINE (BENADRYL) 25 MG TABLET    Take 25 mg by mouth every 6 (six) hours as needed for allergies.   DOCUSATE CALCIUM (STOOL SOFTENER PO)    Take 1 tablet by mouth daily.   ENSURE PLUS (ENSURE  PLUS) LIQD    Take 237 mLs by mouth daily.   GABAPENTIN (NEURONTIN) 300 MG CAPSULE    Take 300 mg by mouth 3 (three) times daily.    MYRBETRIQ 25 MG TB24 TABLET    Take 25 mg by mouth 2 (two) times daily.    PANTOPRAZOLE (PROTONIX) 40 MG TABLET    Take 1 tablet (40 mg total) by mouth 2 (two) times daily.   PRAVASTATIN (PRAVACHOL) 20 MG TABLET    Take 20 mg by mouth daily.    VESICARE 10 MG TABLET    Take 10 mg by mouth at bedtime.   Modified Medications   No medications on file  Discontinued Medications   No medications on file     Physical Exam: Vitals:   07/26/16 1033  BP: 114/65  Pulse: 76    Resp: 20  Temp: 97.6 F (36.4 C)  SpO2: 98%  Weight: 112 lb 9.6 oz (51.1 kg)  Height: 4\' 11"  (1.499 m)    Physical Exam  Constitutional: She is oriented to person, place, and time. She appears well-developed and well-nourished. No distress.  Frail thin female.   HENT:  Head: Normocephalic and atraumatic.  Mouth/Throat: Oropharynx is clear and moist. No oropharyngeal exudate.  Eyes: Conjunctivae are normal. Pupils are equal, round, and reactive to light.  Neck: Normal range of motion. Neck supple.  Cardiovascular: Normal rate, regular rhythm and normal heart sounds.   Pulmonary/Chest: Effort normal and breath sounds normal.  Abdominal: Soft. Bowel sounds are normal.  Musculoskeletal: She exhibits no edema or tenderness.  Generalized weakness  Neurological: She is alert and oriented to person, place, and time.  Skin: Skin is warm and dry. She is not diaphoretic.  Psychiatric: She has a normal mood and affect.    Labs reviewed: Basic Metabolic Panel:  Recent Labs  07/19/16 0508 07/20/16 0507 07/21/16 0345 07/23/16 0407  NA 138 134* 134* 133*  K 4.0 3.8 3.8 3.7  CL 108 107 104 99*  CO2 23 20* 23 27  GLUCOSE 92 93 103* 100*  BUN 15 13 13 13   CREATININE 0.66 0.45 0.50 0.58  CALCIUM 7.6* 7.4* 7.6* 8.0*  MG 1.9  --  1.7 1.7  PHOS 1.5*  --  2.0* 2.3*   Liver Function Tests:  Recent Labs  06/27/16 1837 07/16/16 1644 07/18/16 1020  AST 24 26 18   ALT 24 14 15   ALKPHOS 40 61 51  BILITOT 1.3* 0.9 0.8  PROT 4.5* 5.6* 4.2*  ALBUMIN 2.3* 2.8* 2.0*    Recent Labs  06/27/16 1310  LIPASE 15   No results for input(s): AMMONIA in the last 8760 hours. CBC:  Recent Labs  07/16/16 1644  07/20/16 0507 07/21/16 0345 07/23/16 0407  WBC 17.2*  < > 13.5* 14.1* 11.2*  NEUTROABS 15.6*  --  11.8*  --   --   HGB 8.7*  < > 9.4* 9.2* 9.3*  HCT 24.4*  < > 27.6* 26.2* 27.3*  MCV 98.0  < > 93.9 94.9 95.1  PLT 315  < > 205 211 304  < > = values in this interval not  displayed. TSH: No results for input(s): TSH in the last 8760 hours. A1C: No results found for: HGBA1C Lipid Panel: No results for input(s): CHOL, HDL, LDLCALC, TRIG, CHOLHDL, LDLDIRECT in the last 8760 hours.  Radiological Exams: Dg Abdomen 1 View  Result Date: 07/16/2016 CLINICAL DATA:  Initial evaluation for NG tube placement. EXAM: ABDOMEN - 1 VIEW COMPARISON:  Prior CT from same day. FINDINGS: Enteric tube in place with tip overlying the stomach. Side hole is at or just beyond the level the GE junction. Consider advancement by 3 cm to insure adequate placement within the stomach. Multiple dilated loops of small bowel again seen within the abdomen, compatible with obstruction. Finding better evaluated with CT from the same day. Visualized lung bases are clear. Scoliosis with multilevel degenerative spondylolysis noted. IMPRESSION: 1. NG tube in place with side hole at or just beyond the level the GE junction. Consider advancement by 3 cm to insure adequate placement within the stomach. 2. Multiple dilated gas-filled loops of small bowel within the abdomen, compatible with small bowel obstruction. Findings better evaluated on prior CT from earlier the same day. Electronically Signed   By: Jeannine Boga M.D.   On: 07/16/2016 23:54   Ct Abdomen Pelvis W Contrast  Result Date: 07/16/2016 CLINICAL DATA:  Throwing up dark tarry emesis x1 week. Generalized abdominal pain. EXAM: CT ABDOMEN AND PELVIS WITH CONTRAST TECHNIQUE: Multidetector CT imaging of the abdomen and pelvis was performed using the standard protocol following bolus administration of intravenous contrast. CONTRAST:  65mL ISOVUE-300 IOPAMIDOL (ISOVUE-300) INJECTION 61% COMPARISON:  CT from 06/27/2016 FINDINGS: Lower chest: No acute abnormality. Fluid-filled distal esophagus likely secondary to reflux from marked gastric fluid-filled distention and small-bowel obstruction. Hepatobiliary: Mild intrahepatic ductal dilatation. There are  least 2 gallstones are again identified without significant change measuring up to 9 mm in greatest dimension. No gallbladder wall thickening or distention. No space-occupying mass of the liver. Pancreas: Poorly visualized due to adjacent marked fluid-filled small bowel dilatation. Spleen: No splenomegaly. Adrenals/Urinary Tract: Adrenal glands are unremarkable. Kidneys are normal, without renal calculi, focal lesion, or hydronephrosis. Bladder is partially decompressed. Stomach/Bowel: Marked fluid-filled gastric distention with marked small bowel fluid-filled dilatation up to 4.7 cm. Decompressed large bowel. Findings are in keeping with high-grade small bowel obstruction. Exact source and transition zone is not identified the given history of prior hysterectomy and appendectomy as well as bladder repair, adhesions is a leading consideration. Vascular/Lymphatic: Extensive atherosclerotic calcifications of the abdominal aorta and iliac arteries. No lymphadenopathy. Reproductive: Uterus and adnexa are not well visualized. Other: There is a small volume of ascites interposed between dilated small bowel loops. Musculoskeletal: There shaped scoliosis of the thoracolumbar spine. IMPRESSION: Worsening of small bowel obstruction since prior exam where it was noted mostly within the pelvis. There is marked gastric fluid-filled distention and reflux of contrast into the distal esophagus. Fluid-filled small bowel measures up to 4.7 cm in diameter. NG tube to suction is highly recommended given the potential risk of aspiration due to fluid-filled stomach and reflux. Critical Value/emergent results were called by telephone at the time of interpretation on 07/16/2016 at 9:08 pm to Dr. Davonna Belling , who verbally acknowledged these results. Uncomplicated cholelithiasis. Electronically Signed   By: Ashley Royalty M.D.   On: 07/16/2016 21:08   Dg Abd Acute W/chest  Result Date: 07/16/2016 CLINICAL DATA:  Vomiting for 1 week,  generalized abdominal pain. EXAM: DG ABDOMEN ACUTE W/ 1V CHEST COMPARISON:  Chest x-ray dated 06/27/2016 and abdomen CT dated 06/27/2016. FINDINGS: Single view of the chest and two views of the abdomen are provided. Single view of the chest: Cardiomediastinal silhouette is stable. Atherosclerotic changes noted at the aortic arch. Lungs are at least mildly hyperexpanded suggesting COPD/ emphysema. Lungs appear clear. No pleural effusion or pneumothorax seen. Supine and decubitus views of the abdomen: Significantly distended bowel loops, most likely  small bowel, are seen within the left abdomen and right pelvis, similar to the small-bowel distention seen on earlier CT abdomen of 06/27/2016. Air and stool is noted within the right colon. There is no evidence of free intraperitoneal air. No soft tissue mass or abnormal fluid collection seen. Degenerative changes noted within the scoliotic lumbar spine. No acute or suspicious osseous finding seen. IMPRESSION: 1. No evidence of acute cardiopulmonary abnormality. No evidence of pneumonia. 2. Distended loops of gas-filled bowel within the left abdomen and right pelvis, favored to be small bowel loops, similar to the small bowel distension seen on earlier CT abdomen of 06/27/2016. Chronic partial obstruction or ileus? Chronic institutional bowel syndrome? 3. Aortic atherosclerosis. Electronically Signed   By: Franki Cabot M.D.   On: 07/16/2016 17:50   Dg Abd Portable 1v-small Bowel Protocol-position Verification  Result Date: 07/17/2016 CLINICAL DATA:  NG tube placement, small bowel obstruction EXAM: PORTABLE ABDOMEN - 1 VIEW COMPARISON:  KUB of 07/16/2016 and CT abdomen and pelvis of 07/16/2016 FINDINGS: The tip of the NG tube overlies the expected mid body of the stomach. There appears to have been some slight improvement in the gaseous distention of small bowel with some colonic bowel gas present. However there is opacity noted medially at the left lung base and left  lower lobe pneumonia is a consideration. The left lung base appeared clear on yesterday's CT of the abdomen. Thoracolumbar scoliosis again is noted. Contrast is within the urinary bladder from recent CT scan. Gallstones are present within the right upper quadrant. IMPRESSION: 1. NG tube tip overlies the expected mid body of the stomach. 2. Some improvement in gaseous distention of small bowel. 3. Opacity medially at the left lung base suspicious for pneumonia, new compared to the CT images through the lung bases. 4. Gallstones in the right upper quadrant. Electronically Signed   By: Ivar Drape M.D.   On: 07/17/2016 09:49   Dg Chest Port 1 View  Result Date: 07/17/2016 CLINICAL DATA:  Code sepsis, congestion EXAM: PORTABLE CHEST 1 VIEW COMPARISON:  07/16/2016 FINDINGS: Diffuse opacities in the lungs, new since prior study. This could represent edema or infection. Heart is upper limits normal in size. No effusions. NG tube enters the stomach. IMPRESSION: Diffuse bilateral airspace opacities concerning for edema or infection. Electronically Signed   By: Rolm Baptise M.D.   On: 07/17/2016 11:32    Assessment/Plan 1. Small bowel obstruction Has resolved, tolerating diet, no N/V at this time.   2. Protein-calorie malnutrition, severe Will be followed by RD at facility, conts on supplements  3. Septic shock ([place]) Resolved. No signs of infection at this time  4. Constipation, unspecified constipation type Will add miralax daily  5. Acute blood loss anemia Will follow up cbc due to upper GI bleed. conts on protonix BID    Shelbi Vaccaro K. Harle Battiest  [facility] [phone] 8 am - 5 pm) [phone] (after hours)

## 2016-07-30 ENCOUNTER — Encounter: Payer: Self-pay | Admitting: Internal Medicine

## 2016-07-30 ENCOUNTER — Non-Acute Institutional Stay (SKILLED_NURSING_FACILITY): Payer: Medicare Other | Admitting: Internal Medicine

## 2016-07-30 ENCOUNTER — Other Ambulatory Visit: Payer: Self-pay | Admitting: *Deleted

## 2016-07-30 DIAGNOSIS — Z8701 Personal history of pneumonia (recurrent): Secondary | ICD-10-CM

## 2016-07-30 DIAGNOSIS — K56609 Unspecified intestinal obstruction, unspecified as to partial versus complete obstruction: Secondary | ICD-10-CM | POA: Diagnosis not present

## 2016-07-30 DIAGNOSIS — R6521 Severe sepsis with septic shock: Secondary | ICD-10-CM | POA: Diagnosis not present

## 2016-07-30 DIAGNOSIS — A419 Sepsis, unspecified organism: Secondary | ICD-10-CM

## 2016-07-30 NOTE — Assessment & Plan Note (Signed)
Urine C&S will be rechecked; she has no evidence of active infection

## 2016-07-30 NOTE — Patient Instructions (Signed)
See Current Assessment & Plan in Problem List under specific Diagnosis 

## 2016-07-30 NOTE — Patient Outreach (Signed)
[facility]) Care Management  07/30/2016  RENETTE HSU 1946/06/30 [id number]   Met with patient at bedside. She denies any chronic health issues such as HF, DM or COPD. She states she had pneumonia. She states she is unsure of discharge plan, she states her spouse is in another facility, her son is her POA and will assist her with discharge plans. She took a brochure and will discuss [facility] program services with her son.   Spoke with [name], SW at facility. She states that son is unsure of discharge plans. Discussed that patient is eligible for [facility] program and patient given brochure.  Plan to follow up regarding discharge needs.  Royetta Crochet. Laymond Purser, RN, BSN, [place] Acute Care Coordinator [phone]

## 2016-07-30 NOTE — Assessment & Plan Note (Signed)
O2 sats on room air were 97% today and she has no suggestion of reactive airways disease on exam. Clinically she does have COPD.

## 2016-07-30 NOTE — Assessment & Plan Note (Signed)
Probiotics will be initiated because the stool changes and the recent antibiotic therapy

## 2016-07-30 NOTE — Progress Notes (Signed)
Facility Location: [facility]  Room Number: 111  Code Status: Full Code   PCP: Maggie Font, MD [address] [place] [id number]  This is a comprehensive admission note to [facility] performed on this date less than 30 days from date of admission. Included are preadmission medical/surgical history;reconciled medication list; family history; social history and comprehensive review of systems.  Corrections and additions to the records were documented . Comprehensive physical exam was also performed. Additionally a clinical summary was entered for each active diagnosis pertinent to this admission in the Problem List to enhance continuity of care.  HPI: The patient was hospitalized 1/2-1/10/18 admitted from home with small bowel obstruction complicated by aspiration pneumonia and septic shock. She received Neo-Synephrine for pressure support and Zosyn for the aspiration pneumonia. On 07/22/16 she reported feeling unsafe in her current relationship with her husband.He has been admitted to another SNF;she to this facility. She also has severe protein-caloric malnutrition. At discharge she was off the pressor support agents but remained on oxygen 4 L minute. She had been a 1/2 pack per day smoker prior to the hospitalization.  Fecal occult blood testing was positive. She was placed on a PPI twice a day. Hemoglobin remained stable at approximately 9. GI consultation was to be pursued if she has progressive anemia. She has chronic diastolic congestive heart failure. BNP was 856.  Past medical and surgical history: Includes seasonal allergies, urinary incontinence, GERD and degenerative joint disease. Pertinent surgeries include bilateral lumpectomies and amputation of the left great toe. She's also had bladder repair.  Social history: One half pack per day smoker PTA  Family history: Updated  Review of systems: She has multiple positive symptoms.  She describes frequent stool, an issue since 2013 associated with stool incontinence. She had a severe episode of incontinence this weekend. She describes her butt as being "sore" from the frequent bowel movements. She has seen a gastroenterologist in August of last year. She feels her urine is smelly , she completed antibiotics for urinary tract infection. She also describes some green nasal discharge. Her shortness of breath has improved. She states that the oxygen saturations have not dropped with PT/OT. She has no purulent sputum. She denies significant itching but does have dry skin. She's had some sneezing. Balance issues are chronic. She has bloating intermittently but no other active GI symptoms. She denies active anxiety/depression but expresses "I'm glad to be here". She expressed "I'm leery if if the door is shut". She has a history of snoring without sleep apnea. She has numbness and tingling in her hands and feet. She also has phantom symptoms where the left toe was amputated. She has some chronic right shoulder pain which she relates to lifting her husband. She is concerned about her toenail status  Constitutional: No fever,significant weight change, fatigue  Eyes: No redness, discharge, pain, vision change ENT/mouth: No earache,change in hearing ,sore throat  Cardiovascular: No chest pain, palpitations,paroxysmal nocturnal dyspnea, claudication, edema  Respiratory: No significant cough, sputum production,hemoptysis Gastrointestinal: No heartburn,dysphagia,abdominal pain, nausea / vomiting,rectal bleeding, melena Genitourinary: No dysuria,hematuria, pyuria, nocturia Musculoskeletal: No joint stiffness, joint swelling Dermatologic: No rash Endocrine: No change in hair/skin/ nails, excessive thirst, excessive hunger, excessive urination  Hematologic/lymphatic: No significant bruising, lymphadenopathy,abnormal bleeding Allergy/immunology: No itchy/ watery eyes, urticaria,  angioedema  Physical exam:  Pertinent or positive findings: she is thin and appears somewhat cachectic. Teeth are nicotine stained.  Pectus carinatum is present. There  is asymmetry of the posterior thorax. Thorax is more prominent than the left. Chest is surprisingly clear with only minor rales.  Her abdomen is distended with decreased bowel sounds. Pedal pulses are decreased. She has trace edema at the sock line. Toenails are markedly thickened, especially the right great toe nail. Left toe is absent. She has isolated flexion contractures of the toes. Pes planus is present.   General appearance: no acute distress , increased work of breathing is present.   Lymphatic: No lymphadenopathy about the head, neck, axilla . Eyes: No conjunctival inflammation or lid edema is present. There is no scleral icterus. Ears:  External ear exam shows no significant lesions or deformities.   Nose:  External nasal examination shows no deformity or inflammation. Nasal mucosa are pink and moist without lesions ,exudates Oral exam: lips and gums are healthy appearing.There is no oropharyngeal erythema or exudate . Neck:  No thyromegaly, masses, tenderness noted.    Heart:  Normal rate and regular rhythm. S1 and S2 normal without gallop, murmur, click, rub .  Abdomen:Bowel sounds are normal. Abdomen is soft and nontender with no organomegaly, hernias,masses. GU: deferred  Extremities:  No cyanosis, clubbing  Neurologic exam : Strength equal  in upper & lower extremities Deep tendon reflexes are equal Skin: Warm & dry w/o tenting. No significant lesions or rash.  See clinical summary under each active problem in the Problem List with associated updated therapeutic plan

## 2016-08-05 LAB — CBC AND DIFFERENTIAL
HCT: 28 % — AB (ref 36–46)
HEMOGLOBIN: 9.6 g/dL — AB (ref 12.0–16.0)
Platelets: 456 10*3/uL — AB (ref 150–399)
WBC: 5.5 10*3/mL

## 2016-08-06 ENCOUNTER — Other Ambulatory Visit: Payer: Self-pay | Admitting: *Deleted

## 2016-08-06 NOTE — Patient Outreach (Signed)
[facility]) Care Management  08/06/2016  SHONDA MANDARINO 09-22-1945 [id number]   Met with SW at facility.  She states patient is requesting SW assist her with ALF placement upon discharge.   Plan to sign off case as patient as no [facility] care management discharge needs.  Requested SW to let RNCM know if discharge disposition changes.    Royetta Crochet. Laymond Purser, RN, BSN, [place] [phone]) Business Cell  [phone]) Toll Free Office

## 2016-08-21 ENCOUNTER — Inpatient Hospital Stay (HOSPITAL_COMMUNITY): Payer: Medicare Other

## 2016-08-21 ENCOUNTER — Emergency Department (HOSPITAL_COMMUNITY): Payer: Medicare Other

## 2016-08-21 ENCOUNTER — Inpatient Hospital Stay (HOSPITAL_COMMUNITY)
Admission: EM | Admit: 2016-08-21 | Discharge: 2016-08-29 | DRG: 335 | Disposition: A | Discharge status: Home Health | Payer: Medicare Other | Attending: Internal Medicine | Admitting: Internal Medicine

## 2016-08-21 ENCOUNTER — Encounter (HOSPITAL_COMMUNITY): Payer: Self-pay | Admitting: *Deleted

## 2016-08-21 DIAGNOSIS — R140 Abdominal distension (gaseous): Secondary | ICD-10-CM

## 2016-08-21 DIAGNOSIS — Z48815 Encounter for surgical aftercare following surgery on the digestive system: Secondary | ICD-10-CM | POA: Diagnosis not present

## 2016-08-21 DIAGNOSIS — Z01818 Encounter for other preprocedural examination: Secondary | ICD-10-CM

## 2016-08-21 DIAGNOSIS — R739 Hyperglycemia, unspecified: Secondary | ICD-10-CM | POA: Diagnosis present

## 2016-08-21 DIAGNOSIS — K5649 Other impaction of intestine: Secondary | ICD-10-CM | POA: Diagnosis not present

## 2016-08-21 DIAGNOSIS — E43 Unspecified severe protein-calorie malnutrition: Secondary | ICD-10-CM | POA: Diagnosis not present

## 2016-08-21 DIAGNOSIS — K8066 Calculus of gallbladder and bile duct with acute and chronic cholecystitis without obstruction: Secondary | ICD-10-CM | POA: Diagnosis not present

## 2016-08-21 DIAGNOSIS — Z6822 Body mass index (BMI) 22.0-22.9, adult: Secondary | ICD-10-CM

## 2016-08-21 DIAGNOSIS — D649 Anemia, unspecified: Secondary | ICD-10-CM | POA: Diagnosis not present

## 2016-08-21 DIAGNOSIS — K5651 Intestinal adhesions [bands], with partial obstruction: Secondary | ICD-10-CM | POA: Diagnosis not present

## 2016-08-21 DIAGNOSIS — G8929 Other chronic pain: Secondary | ICD-10-CM | POA: Diagnosis not present

## 2016-08-21 DIAGNOSIS — Z90710 Acquired absence of both cervix and uterus: Secondary | ICD-10-CM | POA: Diagnosis not present

## 2016-08-21 DIAGNOSIS — K8050 Calculus of bile duct without cholangitis or cholecystitis without obstruction: Secondary | ICD-10-CM

## 2016-08-21 DIAGNOSIS — M792 Neuralgia and neuritis, unspecified: Secondary | ICD-10-CM

## 2016-08-21 DIAGNOSIS — Z8249 Family history of ischemic heart disease and other diseases of the circulatory system: Secondary | ICD-10-CM | POA: Diagnosis not present

## 2016-08-21 DIAGNOSIS — K5900 Constipation, unspecified: Secondary | ICD-10-CM | POA: Diagnosis not present

## 2016-08-21 DIAGNOSIS — K565 Intestinal adhesions [bands], unspecified as to partial versus complete obstruction: Secondary | ICD-10-CM | POA: Diagnosis not present

## 2016-08-21 DIAGNOSIS — K449 Diaphragmatic hernia without obstruction or gangrene: Secondary | ICD-10-CM | POA: Diagnosis not present

## 2016-08-21 DIAGNOSIS — R109 Unspecified abdominal pain: Secondary | ICD-10-CM | POA: Diagnosis not present

## 2016-08-21 DIAGNOSIS — K8020 Calculus of gallbladder without cholecystitis without obstruction: Secondary | ICD-10-CM | POA: Diagnosis not present

## 2016-08-21 DIAGNOSIS — K5669 Other partial intestinal obstruction: Secondary | ICD-10-CM | POA: Diagnosis not present

## 2016-08-21 DIAGNOSIS — K8064 Calculus of gallbladder and bile duct with chronic cholecystitis without obstruction: Secondary | ICD-10-CM | POA: Diagnosis not present

## 2016-08-21 DIAGNOSIS — Z4682 Encounter for fitting and adjustment of non-vascular catheter: Secondary | ICD-10-CM | POA: Diagnosis not present

## 2016-08-21 DIAGNOSIS — F17210 Nicotine dependence, cigarettes, uncomplicated: Secondary | ICD-10-CM | POA: Diagnosis present

## 2016-08-21 DIAGNOSIS — E785 Hyperlipidemia, unspecified: Secondary | ICD-10-CM | POA: Diagnosis not present

## 2016-08-21 DIAGNOSIS — R32 Unspecified urinary incontinence: Secondary | ICD-10-CM | POA: Diagnosis present

## 2016-08-21 DIAGNOSIS — K219 Gastro-esophageal reflux disease without esophagitis: Secondary | ICD-10-CM

## 2016-08-21 DIAGNOSIS — K56609 Unspecified intestinal obstruction, unspecified as to partial versus complete obstruction: Secondary | ICD-10-CM

## 2016-08-21 DIAGNOSIS — M6281 Muscle weakness (generalized): Secondary | ICD-10-CM | POA: Diagnosis not present

## 2016-08-21 DIAGNOSIS — R531 Weakness: Secondary | ICD-10-CM | POA: Diagnosis not present

## 2016-08-21 DIAGNOSIS — R262 Difficulty in walking, not elsewhere classified: Secondary | ICD-10-CM

## 2016-08-21 DIAGNOSIS — K8012 Calculus of gallbladder with acute and chronic cholecystitis without obstruction: Secondary | ICD-10-CM | POA: Diagnosis not present

## 2016-08-21 HISTORY — DX: Acquired absence of left foot: Z89.432

## 2016-08-21 HISTORY — DX: Unspecified intestinal obstruction, unspecified as to partial versus complete obstruction: K56.609

## 2016-08-21 HISTORY — DX: Pneumonitis due to inhalation of food and vomit: J69.0

## 2016-08-21 HISTORY — DX: Personal history of other medical treatment: Z92.89

## 2016-08-21 HISTORY — DX: Other chronic pain: G89.29

## 2016-08-21 HISTORY — DX: Unspecified chronic bronchitis: J42

## 2016-08-21 HISTORY — DX: Dorsalgia, unspecified: M54.9

## 2016-08-21 HISTORY — DX: Personal history of other diseases of the digestive system: Z87.19

## 2016-08-21 LAB — COMPREHENSIVE METABOLIC PANEL WITH GFR
ALT: 19 U/L (ref 14–54)
AST: 26 U/L (ref 15–41)
Albumin: 3.1 g/dL — ABNORMAL LOW (ref 3.5–5.0)
Alkaline Phosphatase: 97 U/L (ref 38–126)
Anion gap: 11 (ref 5–15)
BUN: 13 mg/dL (ref 6–20)
CO2: 27 mmol/L (ref 22–32)
Calcium: 10.4 mg/dL — ABNORMAL HIGH (ref 8.9–10.3)
Chloride: 95 mmol/L — ABNORMAL LOW (ref 101–111)
Creatinine, Ser: 0.77 mg/dL (ref 0.44–1.00)
GFR calc Af Amer: >60 mL/min
GFR calc non Af Amer: >60 mL/min
Glucose, Bld: 118 mg/dL — ABNORMAL HIGH (ref 65–99)
Potassium: 4.7 mmol/L (ref 3.5–5.1)
Sodium: 133 mmol/L — ABNORMAL LOW (ref 135–145)
Total Bilirubin: 0.4 mg/dL (ref 0.3–1.2)
Total Protein: 7.5 g/dL (ref 6.5–8.1)

## 2016-08-21 LAB — LIPASE, BLOOD: Lipase: 22 U/L (ref 11–51)

## 2016-08-21 LAB — CBC
HCT: 33.8 % — ABNORMAL LOW (ref 36.0–46.0)
Hemoglobin: 11 g/dL — ABNORMAL LOW (ref 12.0–15.0)
MCH: 31.2 pg (ref 26.0–34.0)
MCHC: 32.5 g/dL (ref 30.0–36.0)
MCV: 95.8 fL (ref 78.0–100.0)
Platelets: 702 10*3/uL — ABNORMAL HIGH (ref 150–400)
RBC: 3.53 MIL/uL — ABNORMAL LOW (ref 3.87–5.11)
RDW: 14.6 % (ref 11.5–15.5)
WBC: 15 10*3/uL — ABNORMAL HIGH (ref 4.0–10.5)

## 2016-08-21 LAB — I-STAT CG4 LACTIC ACID, ED
Lactic Acid, Venous: 1.63 mmol/L (ref 0.5–1.9)
Lactic Acid, Venous: 0.58 mmol/L (ref 0.5–1.9)

## 2016-08-21 LAB — URINALYSIS, ROUTINE W REFLEX MICROSCOPIC
Bilirubin Urine: NEGATIVE
Glucose, UA: NEGATIVE mg/dL
Hgb urine dipstick: NEGATIVE
Ketones, ur: NEGATIVE mg/dL
Leukocytes, UA: NEGATIVE
Nitrite: NEGATIVE
Protein, ur: NEGATIVE mg/dL
Specific Gravity, Urine: >1.046 — ABNORMAL HIGH (ref 1.005–1.030)
pH: 6 (ref 5.0–8.0)

## 2016-08-21 LAB — PROTIME-INR
INR: 1.07
PROTHROMBIN TIME: 13.9 s (ref 11.4–15.2)

## 2016-08-21 LAB — APTT: APTT: 37 s — AB (ref 24–36)

## 2016-08-21 MED ORDER — ACETAMINOPHEN 650 MG RE SUPP: 650.0000 mg | Freq: Four times a day (QID) | RECTAL | Status: DC | PRN

## 2016-08-21 MED ORDER — DEXTROSE-NACL 5-0.9 % IV SOLN
INTRAVENOUS | Status: DC
  Administered 2016-08-21 – 2016-08-23 (×2): via INTRAVENOUS

## 2016-08-21 MED ORDER — DIATRIZOATE MEGLUMINE & SODIUM 66-10 % PO SOLN
90.0000 mL | Freq: Once | ORAL | Status: AC
  Administered 2016-08-21: 90 mL via NASOGASTRIC
  Filled 2016-08-21: qty 90

## 2016-08-21 MED ORDER — ONDANSETRON HCL 4 MG PO TABS: 4.0000 mg | ORAL_TABLET | Freq: Four times a day (QID) | ORAL | Status: DC | PRN

## 2016-08-21 MED ORDER — LIDOCAINE VISCOUS 2 % MT SOLN
15.0000 mL | Freq: Once | OROMUCOSAL | Status: AC
  Administered 2016-08-21: 15 mL via OROMUCOSAL
  Filled 2016-08-21: qty 15

## 2016-08-21 MED ORDER — ACETAMINOPHEN 325 MG PO TABS
650.0000 mg | ORAL_TABLET | Freq: Four times a day (QID) | ORAL | Status: DC | PRN
  Administered 2016-08-27: 650 mg via ORAL
  Filled 2016-08-21: qty 2

## 2016-08-21 MED ORDER — OXYMETAZOLINE HCL 0.05 % NA SOLN
1.0000 | Freq: Once | NASAL | Status: AC
  Administered 2016-08-21: 1 via NASAL
  Filled 2016-08-21: qty 15

## 2016-08-21 MED ORDER — ONDANSETRON HCL 4 MG/2ML IJ SOLN
4.0000 mg | Freq: Four times a day (QID) | INTRAMUSCULAR | Status: DC | PRN
  Administered 2016-08-26 (×2): 4 mg via INTRAVENOUS
  Filled 2016-08-21: qty 2

## 2016-08-21 MED ORDER — ONDANSETRON HCL 4 MG/2ML IJ SOLN
4.0000 mg | Freq: Once | INTRAMUSCULAR | Status: AC
  Administered 2016-08-21: 4 mg via INTRAVENOUS
  Filled 2016-08-21: qty 2

## 2016-08-21 MED ORDER — FAMOTIDINE IN NACL 20-0.9 MG/50ML-% IV SOLN
20.0000 mg | INTRAVENOUS | Status: DC
  Administered 2016-08-21 – 2016-08-22 (×2): 20 mg via INTRAVENOUS
  Filled 2016-08-21 (×3): qty 50

## 2016-08-21 MED ORDER — IOPAMIDOL (ISOVUE-300) INJECTION 61%
INTRAVENOUS | Status: AC
  Administered 2016-08-21: 100 mL
  Filled 2016-08-21: qty 100

## 2016-08-21 MED ORDER — MORPHINE SULFATE (PF) 4 MG/ML IV SOLN
4.0000 mg | Freq: Once | INTRAVENOUS | Status: AC
  Administered 2016-08-21: 4 mg via INTRAVENOUS
  Filled 2016-08-21: qty 1

## 2016-08-21 NOTE — ED Triage Notes (Signed)
Pt reports abdominal pain and distention starting yesterday morning. Pt reports some constipation recently.

## 2016-08-21 NOTE — Consult Note (Signed)
[facility] Consult: 4:44 PM 08/21/2016  LOS: 0 days    Referring Provider: Dr Marily Memos  Primary Care Physician:  Maggie Font, MD Primary Gastroenterologist:  Dr Loletha Carrow.    Reason for Consultation:  choledocholithiasis   HPI: Ashley Howard is a 71 y.o. female.  PMH GERD, HH.  Gallstones. Status post appendectomy, abdominal hysterectomy and bladder surgery.  Pt admitted to [facility] 1/2 -07/24/16 with SBO which caused aspiration pneumonia and septic shock.   CT scan of 07/16/16 showed worsening SBO, marketed gastric distention with reflux of contrast into the distal esophagus, mild intrahepatic ductal dilatation with at least 2 gallstones up to 9 mm in size. LFTs were normal.  She had protein calorie malnutrition, prealbumin of 7.8.  Marland Kitchen She was anemic (Hgb 5.3, s/p PRBC x 2, discharge Hgb 9.3, 1 of 2 FOBT test were positive).  She discharged to [facility].    Beginning yesterday she began to develop pain across her upper abdomen and abdominal swelling. Not much nausea but has been "spitting up". Pain did not go away after administration of pain meds at the nursing home. She says that she's been having bowel movements about every day and had a smaller bowel movement last night. She was brought to the emergency room today.  CT scan of abdomen/pelvis indicates high-grade SBO. Also has intra-and extra hepatic biliary ductal dilatation with multiple gallstones and a distal CBD filling defect.. Abdominal films of the chest and abdomen show colonic ileus, unable to rule out distal colorectal obstruction, gas in the right inguinal canal, difficult to exclude herniaHer LFTs are not elevated. WBCs 15K. Hemoglobin is 11.  Surgery has seen the patient and NG tube has been placed.  Abdominal pain improved following  morphine.  In August 2017 she was referred to Dr. Wilfrid Lund for evaluation of weight loss, intermittent fecal incontinence, abdominal pain along with lots of various complaints.  She was in somewhat frail health at the time and Dr. Loletha Carrow did not feel the risk benefit ratio favored endoscopic workup.  Patient cannot recall whether she's had any colonoscopies in the past and there is no Epic record of colonoscopy.  Past Medical History:  Diagnosis Date  . Arthritis   . Bowel incontinence   . Fluid retention in legs    wears compression stocking R leg  . Gallstones   . GERD (gastroesophageal reflux disease)   . H/O hiatal hernia   . Incontinence of urine    wears adult briefs  . PONV (postoperative nausea and vomiting)   . Scoliosis   . Seasonal allergies   . Shortness of breath    with pain  . Status post partial amputation of foot, left ([place])   . Weight loss, unintentional    40 lbs in last year    Past Surgical History:  Procedure Laterality Date  . ABDOMINAL HYSTERECTOMY  1975  . adenoidectomy    . AMPUTATION  11/07/2011   Procedure: AMPUTATION RAY;  Surgeon: Newt Minion, MD;  Location: [facility];  Service: Orthopedics;  Laterality:  Left;  Left Foot 1st Ray Amputation  . APPENDECTOMY    . BLADDER REPAIR    . BREAST SURGERY  1989   lumpectomy bilat  . DOBUTAMINE STRESS ECHO    . EYE SURGERY     bilat cataracts  . KNEE ARTHROSCOPY     left  . TONSILLECTOMY      Prior to Admission medications   Medication Sig Start Date End Date Taking? Authorizing Provider  acetaminophen (TYLENOL) 325 MG tablet Take 650 mg by mouth every 6 (six) hours as needed for mild pain.   Yes Historical Provider, MD  calcium carbonate (TUMS - DOSED IN MG ELEMENTAL CALCIUM) 500 MG chewable tablet Chew 1 tablet by mouth as needed for indigestion or heartburn.   Yes Historical Provider, MD  diphenhydrAMINE (BENADRYL) 25 MG tablet Take 25 mg by mouth every 6 (six) hours as needed for allergies.   Yes  Historical Provider, MD  Docusate Calcium (STOOL SOFTENER PO) Take 1 tablet by mouth daily.   Yes Historical Provider, MD  ENSURE PLUS (ENSURE PLUS) LIQD Take 237 mLs by mouth daily.   Yes Historical Provider, MD  gabapentin (NEURONTIN) 300 MG capsule Take 300 mg by mouth 3 (three) times daily.  03/04/14  Yes Historical Provider, MD  loratadine (CLARITIN) 10 MG tablet Take 10 mg by mouth daily as needed for allergies.   Yes Historical Provider, MD  Multiple Vitamin (MULTIVITAMIN WITH MINERALS) TABS tablet Take 1 tablet by mouth daily.   Yes Historical Provider, MD  oxybutynin (DITROPAN) 5 MG tablet Take 5 mg by mouth 2 (two) times daily.   Yes Historical Provider, MD  pantoprazole (PROTONIX) 40 MG tablet Take 1 tablet (40 mg total) by mouth 2 (two) times daily. 07/24/16  Yes Patrecia Pour, MD  polyethylene glycol (MIRALAX / GLYCOLAX) packet Take 17 g by mouth daily. Hold for loose stool   Yes Historical Provider, MD  pravastatin (PRAVACHOL) 20 MG tablet Take 20 mg by mouth daily.  09/05/12  Yes Historical Provider, MD  simethicone (MYLICON) 0000000 MG chewable tablet Chew 125 mg by mouth every 6 (six) hours as needed for flatulence.   Yes Historical Provider, MD    Scheduled Meds:  Infusions:  PRN Meds:    Allergies as of 08/21/2016 - Review Complete 08/21/2016  Allergen Reaction Noted  . Tape Other (See Comments) 11/06/2011    Family History  Problem Relation Age of Onset  . Hypertension Mother   . Cancer Father   . Diabetes Neg Hx   . Stroke Neg Hx     Social History   Social History  . Marital status: Married    Spouse name: Nadara Mustard  . Number of children: 1  . Years of education: N/A   Occupational History  . retired    Social History Main Topics  . Smoking status: Current Some Day Smoker    Packs/day: 1.00    Types: Cigarettes  . Smokeless tobacco: Never Used     Comment: smoked since age 54 up to 1 pack per day.one half pack per day prior to admission  . Alcohol use No   . Drug use: No  . Sexual activity: Not Currently   Other Topics Concern  . Not on file   Social History Narrative   Patient is retired.  As of 2018 she was staying at [facility]. Previously she was living at home alone, her husband, who has dementia, lives in a SNF    Right handed.  Caffeine- coffee daily.    REVIEW OF SYSTEMS: Constitutional:  No fatigue. No complaint of weakness. ENT:  No nose bleeds Pulm:  Stable dyspnea on exertion. CV:  No palpitations, no LE edema.  Chest pain GU:  No hematuria, no frequency GI:  No dysphagia. No blood in the stool. Heme:  No unusual bleeding or bruising.   Transfusions:  Cannot recall previous blood transfusions Neuro:  No headaches, no peripheral tingling or numbness Derm:  No itching, no rash or sores.  Endocrine:  No sweats or chills.  No polyuria or dysuria Immunization:  Did not inquire as to recent or previous vaccinations. Travel:  None beyond local counties in last few months.    PHYSICAL EXAM: Vital signs in last 24 hours: Vitals:   08/21/16 1003 08/21/16 1249  BP: 131/87 133/77  Pulse: 97 100  Resp: 16 20  Temp: 99.2 F (37.3 C) 98.4 F (36.9 C)   Wt Readings from Last 3 Encounters:  07/30/16 50.8 kg (112 lb)  07/26/16 51.1 kg (112 lb 9.6 oz)  07/24/16 51.1 kg (112 lb 10.5 oz)    General: Very talkative, tangential, comfortable appearing cachectic WF. Head:  No signs of head trauma. No facial edema or asymmetry.  Eyes:  Scleral icterus, no conjunctival pallor. Ears:  Moderately HOH  Nose:  No congestion or discharge Mouth:  Oral mucosa is moist and clear. Teeth are in poor shape Neck:  JVD, no masses, no thyromegaly Lungs:  Clear bilaterally. No labored breathing or cough. Heart:  RRR. No MRG. S1, S2 present. Abdomen:  Distended and somewhat tense. Mild tenderness across the upper abdomen no guarding or rebound. No HSM, hernias or masses. .   Rectal:  Deferred   Musc/Skeltl:  Arthritic changes  in the knees and fingers. No joint swelling or erythema.  Overall osteoporotic skeletal appearance Extremities:  Thin arms and legs. No CCE. Neurologic:  Moves all 4 limbs. No tremor. Alert, oriented times 3. Skin:  No rashes, sores or telangiectasia Tattoos:  None   Psych:  Pressured, tangential speech making it hard to interview her.  Intake/Output from previous day: No intake/output data recorded. Intake/Output this shift: No intake/output data recorded.  LAB RESULTS:  Recent Labs  08/21/16 1005  WBC 15.0*  HGB 11.0*  HCT 33.8*  PLT 702*   BMET Lab Results  Component Value Date   NA 133 (L) 08/21/2016   NA 133 (L) 07/23/2016   NA 134 (L) 07/21/2016   K 4.7 08/21/2016   K 3.7 07/23/2016   K 3.8 07/21/2016   CL 95 (L) 08/21/2016   CL 99 (L) 07/23/2016   CL 104 07/21/2016   CO2 27 08/21/2016   CO2 27 07/23/2016   CO2 23 07/21/2016   GLUCOSE 118 (H) 08/21/2016   GLUCOSE 100 (H) 07/23/2016   GLUCOSE 103 (H) 07/21/2016   BUN 13 08/21/2016   BUN 13 07/23/2016   BUN 13 07/21/2016   CREATININE 0.77 08/21/2016   CREATININE 0.58 07/23/2016   CREATININE 0.50 07/21/2016   CALCIUM 10.4 (H) 08/21/2016   CALCIUM 8.0 (L) 07/23/2016   CALCIUM 7.6 (L) 07/21/2016   LFT  Recent Labs  08/21/16 1005  PROT 7.5  ALBUMIN 3.1*  AST 26  ALT 19  ALKPHOS 97  BILITOT 0.4   PT/INR Lab Results  Component Value Date   INR 1.06 07/16/2016   INR 0.94 11/07/2011   Hepatitis Panel No results for input(s): HEPBSAG, HCVAB, HEPAIGM, HEPBIGM in the last 72 hours.  C-Diff No components found for: CDIFF Lipase     Component Value Date/Time   LIPASE 22 08/21/2016 1005    Drugs of Abuse  No results found for: LABOPIA, COCAINSCRNUR, LABBENZ, AMPHETMU, THCU, LABBARB   RADIOLOGY STUDIES: Ct Abdomen Pelvis W Contrast  Result Date: 08/21/2016 CLINICAL DATA:  Abdominal pain and distention.  Constipation. EXAM: CT ABDOMEN AND PELVIS WITH CONTRAST TECHNIQUE: Multidetector CT imaging of  the abdomen and pelvis was performed using the standard protocol following bolus administration of intravenous contrast. CONTRAST:  112mL ISOVUE-300 IOPAMIDOL (ISOVUE-300) INJECTION 61% COMPARISON:  07/16/2016 FINDINGS: Lower chest: Mild atelectasis in both lower lobes. No pleural or pericardial fluid. Small hiatal hernia. Hepatobiliary: No liver parenchymal abnormality is seen. There is intra and extrahepatic biliary ductal dilatation. There are multiple stones in the gallbladder. There appears to be a filling defect in the distal common duct, non calcified, most consistent with a passing stone. Pancreas: Normal Spleen: Normal Adrenals/Urinary Tract: Normal appearance of the kidneys except for vascular calcification. Adrenal glands are normal. Stomach/Bowel: Markedly dilated fluid filled loops of small intestine consistent with small bowel obstruction. The colon is essentially collapsed. Few loops of collapsed ileum are noted. Vascular/Lymphatic: Aortic atherosclerosis. No aneurysm. IVC is normal. Reproductive: Previous hysterectomy.  No pelvic mass. Other: Small amount of free fluid.  No free air. Musculoskeletal: Curvature in chronic degenerative change of the spine. Superior endplate fracture of L2 is chronic. IMPRESSION: High-grade small bowel obstruction. Small amount of free fluid. No free air. Chololithiasis. Intra and extra hepatic biliary ductal dilatation. Probable choledocholithiasis with a stone or stones in the distal duct. Electronically Signed   By: Nelson Chimes M.D.   On: 08/21/2016 13:59   Dg Abdomen Acute W/chest  Result Date: 08/21/2016 CLINICAL DATA:  Pain and abdominal distention for 2 days with nausea. EXAM: DG ABDOMEN ACUTE W/ 1V CHEST COMPARISON:  Chest radiograph 07/19/2016. FINDINGS: Frontal view of the chest shows midline trachea and normal heart size. Thoracic aorta is calcified. Biapical pleuroparenchymal scarring. No airspace consolidation or pleural fluid. There is marked gaseous  distention of colon with a fair amount of stool and air-fluid levels. Gas is seen in the rectosigmoid colon. There may be bowel gas extending into the right inguinal canal. No definite small bowel dilatation. Dextroconvex scoliosis, centered near the thoracolumbar junction, with degenerative changes. IMPRESSION: 1. Bowel gas pattern is indicative of a colonic ileus. Distal colorectal obstruction cannot be excluded. 2. There may be bowel gas extending into the right inguinal canal. Difficult to exclude a hernia. 3. CT abdomen pelvis with contrast may be helpful in further evaluation, as clinically indicated. Electronically Signed   By: Lorin Picket M.D.   On: 08/21/2016 13:34     IMPRESSION:   *  SBO.  Recurrence, hospitalized with obstruction 5 weeks previously.  *  Choledocholithiasis with dilated intra-and extrahepatic ducts.. The LFTs are normal and so it is not causing obstruction.  It is NOT clear that this is related to the SBO.  *  Normocytic anemia.  Got 2 PRBCs 5 weeks ago.  Hgb now improved c/w discharge level 3 to 4 weeks ago. Was FOBT + on 1 of 2 tests during recent admission.    *  Protein calorie malnutrition.     PLAN:     *  Continue intermittent NG tube suction. Pain management.   *  Dr Ardis Hughs to decide re timing of ERCP.     Azucena Freed  08/21/2016, 4:44 PM Pager: [phone]  ________________________________________________________________________  Velora Heckler GI MD note:  I personally examined the patient, reviewed the data and agree with the assessment and plan described above.  This morning her abdomen is a bit softer and she has passed a small amount of gas.  Still tympanic on exam, not tender.  Bowel sounds trace.  Her LFTs remain normal and so I do not think that her gallstone disease is related to her bowel obstruction.  She has had numerous abd surgeries and was told in the past she had a lot of adhesions and that is more likely the cause of the SBO.  Seems to  be improving with NG tube overnight. Will follow along. We will eventually need to address her gallstone disease with ERCP and probable cholecystectomy, this may be as outpatient pending her clinical course with this acute illness.   Owens Loffler, MD [facility] Pager [phone]

## 2016-08-21 NOTE — Consult Note (Signed)
[facility] Consult/Admission Note  Ashley Howard 30-May-1946  [id number].    Requesting MD: Dr. Wilson Singer Chief Complaint/Reason for Consult: SBO  HPI:   Patient is a 71 year old female with a past medical history significant for GERD, hiatal hernia, small bowel obstruction that was medically managed  (07/16/2016)  presented to the ED today with complaints of abdominal pain that started yesterday. Patient states the pain progressively worsened to where it waxed and waned and became severe, 10/10, sharp, nonradiating, nothing made it better, nothing made it worse. Associated nausea and bilious vomiting. Patient's last bowel movement was normal yesterday. Patient denies dysuria, hematuria, hematochezia, hematemesis, dizziness, chest pain, shortness of breath. Patient has extensive surgical history including appendectomy, abdominal hysterectomy, bladder surgery.   ED course: Labs: WBC 15, hemoglobin 11 CT scan of abdomen revealed High-grade small bowel instruction. Small amount of free fluid. No free air. Cholelithiasis. Possible choledocholithiasis.   ROS:  Review of Systems  Constitutional: Negative for chills and fever.  HENT: Negative for sore throat.   Eyes: Negative for discharge.  Respiratory: Negative for sputum production.   Cardiovascular: Negative for chest pain.  Gastrointestinal: Positive for abdominal pain, nausea and vomiting. Negative for blood in stool and diarrhea.  Genitourinary: Negative for dysuria and hematuria.  Neurological: Negative for dizziness, loss of consciousness and headaches.  All other systems reviewed and are negative.    Family History  Problem Relation Age of Onset  . Hypertension Mother   . Cancer Father   . Diabetes Neg Hx   . Stroke Neg Hx     Past Medical History:  Diagnosis Date  . Arthritis   . Bowel incontinence   . Fluid retention in legs    wears compression stocking R leg  . Gallstones   . GERD (gastroesophageal reflux  disease)   . H/O hiatal hernia   . Incontinence of urine    wears adult briefs  . PONV (postoperative nausea and vomiting)   . Scoliosis   . Seasonal allergies   . Shortness of breath    with pain  . Status post partial amputation of foot, left ([place])   . Weight loss, unintentional    40 lbs in last year    Past Surgical History:  Procedure Laterality Date  . ABDOMINAL HYSTERECTOMY  1975  . adenoidectomy    . AMPUTATION  11/07/2011   Procedure: AMPUTATION RAY;  Surgeon: Newt Minion, MD;  Location: [facility];  Service: Orthopedics;  Laterality: Left;  Left Foot 1st Ray Amputation  . APPENDECTOMY    . BLADDER REPAIR    . BREAST SURGERY  1989   lumpectomy bilat  . DOBUTAMINE STRESS ECHO    . EYE SURGERY     bilat cataracts  . KNEE ARTHROSCOPY     left  . TONSILLECTOMY      Social History:  reports that she has been smoking Cigarettes.  She has been smoking about 1.00 pack per day. She has never used smokeless tobacco. She reports that she does not drink alcohol or use drugs.  Allergies:  Allergies  Allergen Reactions  . Tape Other (See Comments)    Paper tape - blisters     (Not in a hospital admission)  Blood pressure 133/77, pulse 100, temperature 98.4 F (36.9 C), temperature source Oral, resp. rate 20, SpO2 99 %.  Physical Exam  Constitutional: She is well-developed, well-nourished, and in no distress. Vital signs are normal. No distress.  Thin, frail, elderly woman who  appears older than her stated age  HENT:  Head: Normocephalic and atraumatic.  Nose: Nose normal.  Mouth/Throat: Oropharynx is clear and moist.  Eyes: Conjunctivae are normal. Pupils are equal, round, and reactive to light. Right eye exhibits no discharge. Left eye exhibits no discharge. No scleral icterus.  Neck: Normal range of motion. Neck supple.  Cardiovascular: Normal rate, regular rhythm, normal heart sounds and intact distal pulses.  Exam reveals no gallop and no friction rub.   No murmur  heard. Pulses:      Radial pulses are 2+ on the right side, and 2+ on the left side.       Dorsalis pedis pulses are 2+ on the right side, and 2+ on the left side.  Pulmonary/Chest: Effort normal and breath sounds normal. No respiratory distress. She has no decreased breath sounds. She has no wheezes. She has no rhonchi. She has no rales.  Abdominal: Soft. Bowel sounds are normal. She exhibits distension. She exhibits no mass. There is generalized tenderness. There is no rebound and no guarding.  Distended, increased tympany, previous abdominal scar noted to the periumbilical region, active bowel sounds, generalized TTP  Musculoskeletal: Normal range of motion. She exhibits no edema or deformity.  Neurological: She is alert. GCS score is 15.  Skin: Skin is warm and dry. No rash noted. She is not diaphoretic.  Psychiatric: Mood and affect normal.  Nursing note and vitals reviewed.   Results for orders placed or performed during the hospital encounter of 08/21/16 (from the past 48 hour(s))  Lipase, blood     Status: None   Collection Time: 08/21/16 10:05 AM  Result Value Ref Range   Lipase 22 11 - 51 U/L  Comprehensive metabolic panel     Status: Abnormal   Collection Time: 08/21/16 10:05 AM  Result Value Ref Range   Sodium 133 (L) 135 - 145 mmol/L   Potassium 4.7 3.5 - 5.1 mmol/L   Chloride 95 (L) 101 - 111 mmol/L   CO2 27 22 - 32 mmol/L   Glucose, Bld 118 (H) 65 - 99 mg/dL   BUN 13 6 - 20 mg/dL   Creatinine, Ser 0.77 0.44 - 1.00 mg/dL   Calcium 10.4 (H) 8.9 - 10.3 mg/dL   Total Protein 7.5 6.5 - 8.1 g/dL   Albumin 3.1 (L) 3.5 - 5.0 g/dL   AST 26 15 - 41 U/L   ALT 19 14 - 54 U/L   Alkaline Phosphatase 97 38 - 126 U/L   Total Bilirubin 0.4 0.3 - 1.2 mg/dL   GFR calc non Af Amer >60 >60 mL/min   GFR calc Af Amer >60 >60 mL/min    Comment: (NOTE) The eGFR has been calculated using the CKD EPI equation. This calculation has not been validated in all clinical situations. eGFR's  persistently <60 mL/min signify possible Chronic Kidney Disease.    Anion gap 11 5 - 15  CBC     Status: Abnormal   Collection Time: 08/21/16 10:05 AM  Result Value Ref Range   WBC 15.0 (H) 4.0 - 10.5 K/uL   RBC 3.53 (L) 3.87 - 5.11 MIL/uL   Hemoglobin 11.0 (L) 12.0 - 15.0 g/dL   HCT 33.8 (L) 36.0 - 46.0 %   MCV 95.8 78.0 - 100.0 fL   MCH 31.2 26.0 - 34.0 pg   MCHC 32.5 30.0 - 36.0 g/dL   RDW 14.6 11.5 - 15.5 %   Platelets 702 (H) 150 - 400 K/uL  I-Stat CG4  Lactic Acid, ED     Status: None   Collection Time: 08/21/16 12:45 PM  Result Value Ref Range   Lactic Acid, Venous 1.63 0.5 - 1.9 mmol/L  I-Stat CG4 Lactic Acid, ED     Status: None   Collection Time: 08/21/16  4:04 PM  Result Value Ref Range   Lactic Acid, Venous 0.58 0.5 - 1.9 mmol/L   Ct Abdomen Pelvis W Contrast  Result Date: 08/21/2016 CLINICAL DATA:  Abdominal pain and distention.  Constipation. EXAM: CT ABDOMEN AND PELVIS WITH CONTRAST TECHNIQUE: Multidetector CT imaging of the abdomen and pelvis was performed using the standard protocol following bolus administration of intravenous contrast. CONTRAST:  165m ISOVUE-300 IOPAMIDOL (ISOVUE-300) INJECTION 61% COMPARISON:  07/16/2016 FINDINGS: Lower chest: Mild atelectasis in both lower lobes. No pleural or pericardial fluid. Small hiatal hernia. Hepatobiliary: No liver parenchymal abnormality is seen. There is intra and extrahepatic biliary ductal dilatation. There are multiple stones in the gallbladder. There appears to be a filling defect in the distal common duct, non calcified, most consistent with a passing stone. Pancreas: Normal Spleen: Normal Adrenals/Urinary Tract: Normal appearance of the kidneys except for vascular calcification. Adrenal glands are normal. Stomach/Bowel: Markedly dilated fluid filled loops of small intestine consistent with small bowel obstruction. The colon is essentially collapsed. Few loops of collapsed ileum are noted. Vascular/Lymphatic: Aortic  atherosclerosis. No aneurysm. IVC is normal. Reproductive: Previous hysterectomy.  No pelvic mass. Other: Small amount of free fluid.  No free air. Musculoskeletal: Curvature in chronic degenerative change of the spine. Superior endplate fracture of L2 is chronic. IMPRESSION: High-grade small bowel obstruction. Small amount of free fluid. No free air. Chololithiasis. Intra and extra hepatic biliary ductal dilatation. Probable choledocholithiasis with a stone or stones in the distal duct. Electronically Signed   By: MNelson ChimesM.D.   On: 08/21/2016 13:59   Dg Abdomen Acute W/chest  Result Date: 08/21/2016 CLINICAL DATA:  Pain and abdominal distention for 2 days with nausea. EXAM: DG ABDOMEN ACUTE W/ 1V CHEST COMPARISON:  Chest radiograph 07/19/2016. FINDINGS: Frontal view of the chest shows midline trachea and normal heart size. Thoracic aorta is calcified. Biapical pleuroparenchymal scarring. No airspace consolidation or pleural fluid. There is marked gaseous distention of colon with a fair amount of stool and air-fluid levels. Gas is seen in the rectosigmoid colon. There may be bowel gas extending into the right inguinal canal. No definite small bowel dilatation. Dextroconvex scoliosis, centered near the thoracolumbar junction, with degenerative changes. IMPRESSION: 1. Bowel gas pattern is indicative of a colonic ileus. Distal colorectal obstruction cannot be excluded. 2. There may be bowel gas extending into the right inguinal canal. Difficult to exclude a hernia. 3. CT abdomen pelvis with contrast may be helpful in further evaluation, as clinically indicated. Electronically Signed   By: MLorin PicketM.D.   On: 08/21/2016 13:34      Assessment/Plan  SBO - CT scan concerning for small bowel obstruction and choledocholithiasis - NG tube and small bowel protocol, nothing by mouth - IV fluids - Primary is consulting GI for possible MRCP or ERCP - Possible cholecystectomy during this hospital  admission pending GIs recommendations  We will continue to follow this patient. Thank you for the consult.  JKalman Drape [facility] 08/21/2016, 4:06 PM Pager: [phone]: [phone]-Fri 7:00 am-4:30 pm Sat-Sun 7:00 am-11:30 am

## 2016-08-21 NOTE — ED Notes (Addendum)
Pt son has called and asked that we keep him informed with any progress on his mother. Cell # [phone] Work# [phone]

## 2016-08-21 NOTE — ED Provider Notes (Signed)
[facility] DEPT Provider Note   CSN: [id number] Arrival date & time: 08/21/16  1002     History   Chief Complaint Chief Complaint  Patient presents with  . Abdominal Pain    HPI Ashley Howard is a 71 y.o. female.  71 year old Caucasian female past medical history significant for    71 year old Caucasian female past medical history significant for GERD, hiatal hernia, small bowel obstruction  (07/16/2016) that presents to the ED today with a 2 day history complaining of acute onset of abdominal pain and distention started approximately one day ago. Patient states that her pain started yesterday afternoon. He states this progressively worsened. Describes pain as constant and sharp in nature. States her last bowel movement was last night. Was dark in nature and hard. History of constipation. She denies any hematochezia. States she has been nauseous with dry heaves but has not emesis. Has not eaten anything since yesterday. Patient was admitted on 07/16/16 for small bowel obstruction. Patient became hypotensive with a fever and treated for septic shock due to aspiration pneumonia. She was discharged 07/24/16 [facility]. States she has been feeling fine up until yesterday. Denies any urinary symptoms. Denies any fevers. States her pain is 10 out of 10. Nothing makes better or worse. Denies any fever, chills, headache, vision changes, lightheadedness, dizziness, chest pain, shortness of breath, urinary symptoms, paresthesias. Patient states that she has had severe amount of abdominal surgeries including appendectomy, hysterectomy, bladder surgery.  Past Medical History:  Diagnosis Date  . Arthritis   . Bowel incontinence   . Fluid retention in legs    wears compression stocking R leg  . Gallstones   . GERD (gastroesophageal reflux disease)   . H/O hiatal hernia   . Incontinence of urine    wears adult briefs  . PONV (postoperative nausea and vomiting)   . Scoliosis   . Seasonal  allergies   . Shortness of breath    with pain  . Weight loss, unintentional    40 lbs in last year    Patient Active Problem List   Diagnosis Date Noted  . History of aspiration pneumonia 07/30/2016  . Septic shock ([place])   . Protein-calorie malnutrition, severe 07/20/2016  . Small bowel obstruction 07/16/2016  . Dehydration 07/16/2016  . Abnormality of gait 01/04/2013    Past Surgical History:  Procedure Laterality Date  . ABDOMINAL HYSTERECTOMY  1975  . adenoidectomy    . AMPUTATION  11/07/2011   Procedure: AMPUTATION RAY;  Surgeon: Newt Minion, MD;  Location: [facility];  Service: Orthopedics;  Laterality: Left;  Left Foot 1st Ray Amputation  . APPENDECTOMY    . BLADDER REPAIR    . BREAST SURGERY  1989   lumpectomy bilat  . DOBUTAMINE STRESS ECHO    . EYE SURGERY     bilat cataracts  . KNEE ARTHROSCOPY     left  . TONSILLECTOMY      OB History    No data available       Home Medications    Prior to Admission medications   Medication Sig Start Date End Date Taking? Authorizing Provider  calcium carbonate (TUMS - DOSED IN MG ELEMENTAL CALCIUM) 500 MG chewable tablet Chew 1 tablet by mouth as needed for indigestion or heartburn.    Historical Provider, MD  diphenhydrAMINE (BENADRYL) 25 MG tablet Take 25 mg by mouth every 6 (six) hours as needed for allergies.    Historical Provider, MD  Docusate Calcium (STOOL SOFTENER PO)  Take 1 tablet by mouth daily.    Historical Provider, MD  ENSURE PLUS (ENSURE PLUS) LIQD Take 237 mLs by mouth daily.    Historical Provider, MD  gabapentin (NEURONTIN) 300 MG capsule Take 300 mg by mouth 3 (three) times daily.  03/04/14   Historical Provider, MD  MYRBETRIQ 25 MG TB24 tablet Take 25 mg by mouth 2 (two) times daily.  03/04/14   Historical Provider, MD  pantoprazole (PROTONIX) 40 MG tablet Take 1 tablet (40 mg total) by mouth 2 (two) times daily. 07/24/16   Patrecia Pour, MD  polyethylene glycol (MIRALAX / Floria Raveling) packet Take 17 g by  mouth daily. Hold for loose stool    Historical Provider, MD  pravastatin (PRAVACHOL) 20 MG tablet Take 20 mg by mouth daily.  09/05/12   Historical Provider, MD    Family History Family History  Problem Relation Age of Onset  . Hypertension Mother   . Cancer Father   . Diabetes Neg Hx   . Stroke Neg Hx     Social History Social History  Substance Use Topics  . Smoking status: Current Some Day Smoker    Packs/day: 1.00    Types: Cigarettes  . Smokeless tobacco: Never Used     Comment: smoked since age 22 up to 1 pack per day.one half pack per day prior to admission  . Alcohol use No     Allergies   Tape   Review of Systems Review of Systems  Constitutional: Negative for chills and fever.  HENT: Negative for congestion.   Eyes: Negative for visual disturbance.  Respiratory: Negative for cough and shortness of breath.   Cardiovascular: Negative for chest pain.  Gastrointestinal: Positive for abdominal distention, abdominal pain, blood in stool, constipation, nausea and vomiting. Negative for diarrhea.  Genitourinary: Negative for dysuria, flank pain, hematuria and urgency.  Musculoskeletal: Negative.   Skin: Negative.   Neurological: Negative for dizziness, syncope, weakness, light-headedness, numbness and headaches.  All other systems reviewed and are negative.    Physical Exam Updated Vital Signs BP 131/87 (BP Location: Right Arm)   Pulse 97   Temp 99.2 F (37.3 C) (Oral)   Resp 16   SpO2 97%   Physical Exam  Constitutional: She is oriented to person, place, and time. She appears well-developed and well-nourished. She appears distressed.  Patient appears to be uncomfortable on bed and yelling in pain. However she is easily distractible.  HENT:  Head: Normocephalic and atraumatic.  Mouth/Throat: Oropharynx is clear and moist.  Eyes: Conjunctivae are normal. Pupils are equal, round, and reactive to light. Right eye exhibits no discharge. Left eye exhibits no  discharge. No scleral icterus.  Neck: Normal range of motion. Neck supple. No thyromegaly present.  Cardiovascular: Normal rate, regular rhythm, normal heart sounds and intact distal pulses.  Exam reveals no gallop and no friction rub.   No murmur heard. Pulmonary/Chest: Effort normal and breath sounds normal. No respiratory distress.  Abdominal: Soft. Bowel sounds are normal. She exhibits no distension. There is generalized tenderness. There is no rigidity, no rebound, no guarding and no CVA tenderness.  Abdomen is mildly distended. However it is soft. Bowel sounds are decreased. Abdomen is not rigid. No signs or rebound. Generalized tenderness.  Musculoskeletal: Normal range of motion.  Lymphadenopathy:    She has no cervical adenopathy.  Neurological: She is alert and oriented to person, place, and time.  Skin: Skin is warm and dry. Capillary refill takes less than 2 seconds.  Nursing note and vitals reviewed.    ED Treatments / Results  Labs (all labs ordered are listed, but only abnormal results are displayed) Labs Reviewed  COMPREHENSIVE METABOLIC PANEL - Abnormal; Notable for the following:       Result Value   Sodium 133 (*)    Chloride 95 (*)    Glucose, Bld 118 (*)    Calcium 10.4 (*)    Albumin 3.1 (*)    All other components within normal limits  CBC - Abnormal; Notable for the following:    WBC 15.0 (*)    RBC 3.53 (*)    Hemoglobin 11.0 (*)    HCT 33.8 (*)    Platelets 702 (*)    All other components within normal limits  LIPASE, BLOOD  URINALYSIS, ROUTINE W REFLEX MICROSCOPIC  I-STAT CG4 LACTIC ACID, ED  I-STAT CG4 LACTIC ACID, ED    EKG  EKG Interpretation None       Radiology Ct Abdomen Pelvis W Contrast  Result Date: 08/21/2016 CLINICAL DATA:  Abdominal pain and distention.  Constipation. EXAM: CT ABDOMEN AND PELVIS WITH CONTRAST TECHNIQUE: Multidetector CT imaging of the abdomen and pelvis was performed using the standard protocol following bolus  administration of intravenous contrast. CONTRAST:  136mL ISOVUE-300 IOPAMIDOL (ISOVUE-300) INJECTION 61% COMPARISON:  07/16/2016 FINDINGS: Lower chest: Mild atelectasis in both lower lobes. No pleural or pericardial fluid. Small hiatal hernia. Hepatobiliary: No liver parenchymal abnormality is seen. There is intra and extrahepatic biliary ductal dilatation. There are multiple stones in the gallbladder. There appears to be a filling defect in the distal common duct, non calcified, most consistent with a passing stone. Pancreas: Normal Spleen: Normal Adrenals/Urinary Tract: Normal appearance of the kidneys except for vascular calcification. Adrenal glands are normal. Stomach/Bowel: Markedly dilated fluid filled loops of small intestine consistent with small bowel obstruction. The colon is essentially collapsed. Few loops of collapsed ileum are noted. Vascular/Lymphatic: Aortic atherosclerosis. No aneurysm. IVC is normal. Reproductive: Previous hysterectomy.  No pelvic mass. Other: Small amount of free fluid.  No free air. Musculoskeletal: Curvature in chronic degenerative change of the spine. Superior endplate fracture of L2 is chronic. IMPRESSION: High-grade small bowel obstruction. Small amount of free fluid. No free air. Chololithiasis. Intra and extra hepatic biliary ductal dilatation. Probable choledocholithiasis with a stone or stones in the distal duct. Electronically Signed   By: Nelson Chimes M.D.   On: 08/21/2016 13:59   Dg Abdomen Acute W/chest  Result Date: 08/21/2016 CLINICAL DATA:  Pain and abdominal distention for 2 days with nausea. EXAM: DG ABDOMEN ACUTE W/ 1V CHEST COMPARISON:  Chest radiograph 07/19/2016. FINDINGS: Frontal view of the chest shows midline trachea and normal heart size. Thoracic aorta is calcified. Biapical pleuroparenchymal scarring. No airspace consolidation or pleural fluid. There is marked gaseous distention of colon with a fair amount of stool and air-fluid levels. Gas is seen  in the rectosigmoid colon. There may be bowel gas extending into the right inguinal canal. No definite small bowel dilatation. Dextroconvex scoliosis, centered near the thoracolumbar junction, with degenerative changes. IMPRESSION: 1. Bowel gas pattern is indicative of a colonic ileus. Distal colorectal obstruction cannot be excluded. 2. There may be bowel gas extending into the right inguinal canal. Difficult to exclude a hernia. 3. CT abdomen pelvis with contrast may be helpful in further evaluation, as clinically indicated. Electronically Signed   By: Lorin Picket M.D.   On: 08/21/2016 13:34    Procedures Procedures (including critical care time)  Medications  Ordered in ED Medications  ondansetron (ZOFRAN) injection 4 mg (not administered)  morphine 4 MG/ML injection 4 mg (not administered)     Initial Impression / Assessment and Plan / ED Course  I have reviewed the triage vital signs and the nursing notes.  Pertinent labs & imaging results that were available during my care of the patient were reviewed by me and considered in my medical decision making (see chart for details).     Patient presents to the ED with complaints of abdominal pain and abdominal distention onset one day ago. Patient with history of small bowel traction one month ago was hospitalized for same. Patient states last bowel movement was last night. Endorses nausea but denies emesis. EKG obtained in December 2017 showed normal QT. Patient was given Zofran and morphine in the ED. Abdomen is distended but soft. Patient is easily distractible pain. No rigid abdomen or signs of peritonitis. Will obtain acute abdominal series and CT abdomen.Mild leukocytosis of 15 noted. Lactic acid was normal. Lipase was normal. Hemoglobin with mild anemia at 11 that is stable for patient. Her creatinine is normal. Patient unable to provide a urine sample this time. CT abdomen pelvis revealed high-grade small bowel obstruction with small  amount of free fluid. No free air. Chololithiasis. Intra and extra hepatic biliary ductal dilatation. Probable choledocholithiasis with a stone or stones in the distal duct. NG tube was placed. Patient abdomen is still distended this time. However she is resting comfortably in the bed. No signs of peritonitis or rigid abdomen at this time. Denies any pain. Consult to with Will PA-C with general surgery who would like patient admitted to the hospital service and they will consult on patient. Spoke with Dr. Barbaraann Faster with [facility] who agrees to admit patient. Recommends consult with GI for choledocholithiasis. Patient was informed of plan of care. Spoke with patient's son and informed him of plan of care. Patient is currently hemodynamically stable. She is afebrile and not tachycardic.  Final Clinical Impressions(s) / ED Diagnoses   Final diagnoses:  SBO (small bowel obstruction)  Choledocholithiasis    New Prescriptions New Prescriptions   No medications on file     Doristine Devoid, PA-C 08/21/16 [place], MD 09/02/16 [phone]

## 2016-08-21 NOTE — H&P (Signed)
History and Physical    Ashley Howard [id number] DOB: 08/19/1945 DOA: 08/21/2016  PCP: Maggie Font, MD Patient coming from: SNF  Chief Complaint: Abd pain  HPI: Ashley Howard is a 71 y.o. female with medical history significant of SBO, GERD, Gallstones, urinary incontinence, scoliosis, presenting w/ 1 day h/o ABD pain. Diffuse. Initially intermittent but now constant. Nothing makes it better, nothing makes it worse. Associated with minimal nausea and vomiting with last bowel movement being one day ago. Denies hematochezia, melena, hematemesis, chest pain, short of breath, palpitations, fevers, dysuria, back pain, next to this, headache. Patient reports that her bladder incontinence is at baseline. Symptoms are getting worse. Patient states that this is similar to her previous small bowel obstruction. Patient states that "my burps tastes like poop."  ED Course: Objective findings outlined below. NG tube placed by EDP. Patient given morphine and Zofran.  Review of Systems: As per HPI otherwise 10 point review of systems negative.   Ambulatory Status: no restrictions  Past Medical History:  Diagnosis Date  . Arthritis   . Bowel incontinence   . Fluid retention in legs    wears compression stocking R leg  . Gallstones   . GERD (gastroesophageal reflux disease)   . H/O hiatal hernia   . Incontinence of urine    wears adult briefs  . PONV (postoperative nausea and vomiting)   . Scoliosis   . Seasonal allergies   . Shortness of breath    with pain  . Status post partial amputation of foot, left ([place])   . Weight loss, unintentional    40 lbs in last year    Past Surgical History:  Procedure Laterality Date  . ABDOMINAL HYSTERECTOMY  1975  . adenoidectomy    . AMPUTATION  11/07/2011   Procedure: AMPUTATION RAY;  Surgeon: Newt Minion, MD;  Location: [facility];  Service: Orthopedics;  Laterality: Left;  Left Foot 1st Ray Amputation  . APPENDECTOMY    . BLADDER REPAIR    .  BREAST SURGERY  1989   lumpectomy bilat  . DOBUTAMINE STRESS ECHO    . EYE SURGERY     bilat cataracts  . KNEE ARTHROSCOPY     left  . TONSILLECTOMY      Social History   Social History  . Marital status: Married    Spouse name: Nadara Mustard  . Number of children: 1  . Years of education: N/A   Occupational History  . retired    Social History Main Topics  . Smoking status: Current Some Day Smoker    Packs/day: 1.00    Types: Cigarettes  . Smokeless tobacco: Never Used     Comment: smoked since age 28 up to 1 pack per day.one half pack per day prior to admission  . Alcohol use No  . Drug use: No  . Sexual activity: Not Currently   Other Topics Concern  . Not on file   Social History Narrative   Patient is retired.  As of 2018 she was staying at [place]. Previously she was living at home alone, her husband, who has dementia, lives in a SNF    Right handed.   Caffeine- coffee daily.    Allergies  Allergen Reactions  . Tape Other (See Comments)    Paper tape - blisters    Family History  Problem Relation Age of Onset  . Hypertension Mother   . Cancer Father   . Diabetes Neg Hx   .  Stroke Neg Hx     Prior to Admission medications   Medication Sig Start Date End Date Taking? Authorizing Provider  acetaminophen (TYLENOL) 325 MG tablet Take 650 mg by mouth every 6 (six) hours as needed for mild pain.   Yes Historical Provider, MD  calcium carbonate (TUMS - DOSED IN MG ELEMENTAL CALCIUM) 500 MG chewable tablet Chew 1 tablet by mouth as needed for indigestion or heartburn.   Yes Historical Provider, MD  diphenhydrAMINE (BENADRYL) 25 MG tablet Take 25 mg by mouth every 6 (six) hours as needed for allergies.   Yes Historical Provider, MD  Docusate Calcium (STOOL SOFTENER PO) Take 1 tablet by mouth daily.   Yes Historical Provider, MD  ENSURE PLUS (ENSURE PLUS) LIQD Take 237 mLs by mouth daily.   Yes Historical Provider, MD  gabapentin (NEURONTIN) 300 MG  capsule Take 300 mg by mouth 3 (three) times daily.  03/04/14  Yes Historical Provider, MD  loratadine (CLARITIN) 10 MG tablet Take 10 mg by mouth daily as needed for allergies.   Yes Historical Provider, MD  Multiple Vitamin (MULTIVITAMIN WITH MINERALS) TABS tablet Take 1 tablet by mouth daily.   Yes Historical Provider, MD  oxybutynin (DITROPAN) 5 MG tablet Take 5 mg by mouth 2 (two) times daily.   Yes Historical Provider, MD  pantoprazole (PROTONIX) 40 MG tablet Take 1 tablet (40 mg total) by mouth 2 (two) times daily. 07/24/16  Yes Patrecia Pour, MD  polyethylene glycol (MIRALAX / GLYCOLAX) packet Take 17 g by mouth daily. Hold for loose stool   Yes Historical Provider, MD  pravastatin (PRAVACHOL) 20 MG tablet Take 20 mg by mouth daily.  09/05/12  Yes Historical Provider, MD  simethicone (MYLICON) 0000000 MG chewable tablet Chew 125 mg by mouth every 6 (six) hours as needed for flatulence.   Yes Historical Provider, MD    Physical Exam: Vitals:   08/21/16 1003 08/21/16 1249 08/21/16 1700  BP: 131/87 133/77 106/84  Pulse: 97 100 87  Resp: 16 20   Temp: 99.2 F (37.3 C) 98.4 F (36.9 C)   TempSrc: Oral Oral   SpO2: 97% 99% 98%     General: resting in bed and uncumfortable Eyes: PERRL, EOMI, normal lids, iris ENT: poor dentition. Dry mm grossly normal hearing, lips & tongue, mmm Neck:  no LAD, masses or thyromegaly Cardiovascular:  RRR, no m/r/g. No LE edema.  Respiratory:  CTA bilaterally, no w/r/r. Normal respiratory effort. Abdomen: Distended, mild diffuse tenderness, hypoactive bowel sounds  Skin:  no rash or induration seen on limited exam Musculoskeletal:  grossly normal tone BUE/BLE, good ROM, no bony abnormality Psychiatric:  grossly normal mood and affect, speech fluent and appropriate, AOx3 Neurologic:  CN 2-12 grossly intact, moves all extremities in coordinated fashion, sensation intact  Labs on Admission: I have personally reviewed following labs and imaging  studies  CBC:  Recent Labs Lab 08/21/16 1005  WBC 15.0*  HGB 11.0*  HCT 33.8*  MCV 95.8  PLT [id number]*   Basic Metabolic Panel:  Recent Labs Lab 08/21/16 1005  NA 133*  K 4.7  CL 95*  CO2 27  GLUCOSE 118*  BUN 13  CREATININE 0.77  CALCIUM 10.4*   GFR: CrCl cannot be calculated (Unknown ideal weight.). Liver Function Tests:  Recent Labs Lab 08/21/16 1005  AST 26  ALT 19  ALKPHOS 97  BILITOT 0.4  PROT 7.5  ALBUMIN 3.1*    Recent Labs Lab 08/21/16 1005  LIPASE 22  No results for input(s): AMMONIA in the last 168 hours. Coagulation Profile: No results for input(s): INR, PROTIME in the last 168 hours. Cardiac Enzymes: No results for input(s): CKTOTAL, CKMB, CKMBINDEX, TROPONINI in the last 168 hours. BNP (last 3 results) No results for input(s): PROBNP in the last 8760 hours. HbA1C: No results for input(s): HGBA1C in the last 72 hours. CBG: No results for input(s): GLUCAP in the last 168 hours. Lipid Profile: No results for input(s): CHOL, HDL, LDLCALC, TRIG, CHOLHDL, LDLDIRECT in the last 72 hours. Thyroid Function Tests: No results for input(s): TSH, T4TOTAL, FREET4, T3FREE, THYROIDAB in the last 72 hours. Anemia Panel: No results for input(s): VITAMINB12, FOLATE, FERRITIN, TIBC, IRON, RETICCTPCT in the last 72 hours. Urine analysis:    Component Value Date/Time   COLORURINE YELLOW 07/16/2016 2000   APPEARANCEUR HAZY (A) 07/16/2016 2000   LABSPEC 1.017 07/16/2016 2000   PHURINE 5.0 07/16/2016 2000   GLUCOSEU NEGATIVE 07/16/2016 2000   HGBUR NEGATIVE 07/16/2016 2000   BILIRUBINUR NEGATIVE 07/16/2016 2000   KETONESUR NEGATIVE 07/16/2016 2000   PROTEINUR NEGATIVE 07/16/2016 2000   UROBILINOGEN 0.2 06/27/2016 1216   NITRITE NEGATIVE 07/16/2016 2000   LEUKOCYTESUR NEGATIVE 07/16/2016 2000    Creatinine Clearance: CrCl cannot be calculated (Unknown ideal weight.).  Sepsis Labs: @LABRCNTIP (procalcitonin:4,lacticidven:4) )No results found for  this or any previous visit (from the past 240 hour(s)).   Radiological Exams on Admission: Ct Abdomen Pelvis W Contrast  Result Date: 08/21/2016 CLINICAL DATA:  Abdominal pain and distention.  Constipation. EXAM: CT ABDOMEN AND PELVIS WITH CONTRAST TECHNIQUE: Multidetector CT imaging of the abdomen and pelvis was performed using the standard protocol following bolus administration of intravenous contrast. CONTRAST:  162mL ISOVUE-300 IOPAMIDOL (ISOVUE-300) INJECTION 61% COMPARISON:  07/16/2016 FINDINGS: Lower chest: Mild atelectasis in both lower lobes. No pleural or pericardial fluid. Small hiatal hernia. Hepatobiliary: No liver parenchymal abnormality is seen. There is intra and extrahepatic biliary ductal dilatation. There are multiple stones in the gallbladder. There appears to be a filling defect in the distal common duct, non calcified, most consistent with a passing stone. Pancreas: Normal Spleen: Normal Adrenals/Urinary Tract: Normal appearance of the kidneys except for vascular calcification. Adrenal glands are normal. Stomach/Bowel: Markedly dilated fluid filled loops of small intestine consistent with small bowel obstruction. The colon is essentially collapsed. Few loops of collapsed ileum are noted. Vascular/Lymphatic: Aortic atherosclerosis. No aneurysm. IVC is normal. Reproductive: Previous hysterectomy.  No pelvic mass. Other: Small amount of free fluid.  No free air. Musculoskeletal: Curvature in chronic degenerative change of the spine. Superior endplate fracture of L2 is chronic. IMPRESSION: High-grade small bowel obstruction. Small amount of free fluid. No free air. Chololithiasis. Intra and extra hepatic biliary ductal dilatation. Probable choledocholithiasis with a stone or stones in the distal duct. Electronically Signed   By: Nelson Chimes M.D.   On: 08/21/2016 13:59   Dg Abdomen Acute W/chest  Result Date: 08/21/2016 CLINICAL DATA:  Pain and abdominal distention for 2 days with nausea.  EXAM: DG ABDOMEN ACUTE W/ 1V CHEST COMPARISON:  Chest radiograph 07/19/2016. FINDINGS: Frontal view of the chest shows midline trachea and normal heart size. Thoracic aorta is calcified. Biapical pleuroparenchymal scarring. No airspace consolidation or pleural fluid. There is marked gaseous distention of colon with a fair amount of stool and air-fluid levels. Gas is seen in the rectosigmoid colon. There may be bowel gas extending into the right inguinal canal. No definite small bowel dilatation. Dextroconvex scoliosis, centered near the thoracolumbar junction, with degenerative  changes. IMPRESSION: 1. Bowel gas pattern is indicative of a colonic ileus. Distal colorectal obstruction cannot be excluded. 2. There may be bowel gas extending into the right inguinal canal. Difficult to exclude a hernia. 3. CT abdomen pelvis with contrast may be helpful in further evaluation, as clinically indicated. Electronically Signed   By: Lorin Picket M.D.   On: 08/21/2016 13:34   Dg Abd Portable 1 View  Result Date: 08/21/2016 CLINICAL DATA:  71 year old female enteric tube placement. Initial encounter. EXAM: PORTABLE ABDOMEN - 1 VIEW COMPARISON:  CT Abdomen and Pelvis 1342 hours today. FINDINGS: Portable AP supine view at 1646 hours. Enteric tube courses to the stomach. The side hole is in the distal thoracic esophagus. Negative lung bases. Small volume of excreted IV contrast in the renal collecting systems stable bowel gas pattern. IMPRESSION: 1. Enteric tube side hole in the distal thoracic esophagus. Advance 8 cm to ensure side hole placement within the stomach. 2. Stable bowel gas pattern from the CT Abdomen and Pelvis today. Electronically Signed   By: Genevie Ann M.D.   On: 08/21/2016 16:57    EKG: Pending   Assessment/Plan Active Problems:   Protein-calorie malnutrition, severe   SBO (small bowel obstruction)   Choledocholithiasis   Urinary incontinence   GERD (gastroesophageal reflux disease)   Neuropathic  pain   Hyperglycemia   SBO: noted on Abd film. H/o of the same. Medically managed in past. NG tube placed by EDP - continue NGT to intermittent suction - SBO study - Further mgt per CCS  Choledocholithiasis: GI following. Possible distal CBD retained stone w/ dilitation extending to the intrahepatic ducts. May eventually need cholecystecomy - [facility] following - MRCP vs ERCP ???   - Coags, CXR, EKG (pre-op eval)  Urinary incontinence: s/p bladder surgery - continue ditropan when taking PO - Bladder scan PRN  GERD: - continue pepcid IV until taking PO and then Protonix  Protein calorie malnutrition: - Continue ensure when taking orals  HLD: - continue statin when taking PO  CHronic Neuropathic pain: - continue neurontin when taking PO  Hyperglycemia: 118. Likely from stress - CBG Q8  Leukocytosis: no evidence of infectious etiology though UA pending. Likely reactive and from volume contracture  - CBC in am - IVF - UA   DVT prophylaxis: SCD  Code Status: FULL  Family Communication: none  Disposition Plan: pending improvement in SBO and GI workup  Consults called: GI, CCS  Admission status: inpt    Placida Cambre J MD [facility]  If 7PM-7AM, please contact night-coverage [email or web address] Password [email or web address]  08/21/2016, 5:18 PM

## 2016-08-22 ENCOUNTER — Inpatient Hospital Stay (HOSPITAL_COMMUNITY): Payer: Medicare Other

## 2016-08-22 DIAGNOSIS — K56609 Unspecified intestinal obstruction, unspecified as to partial versus complete obstruction: Secondary | ICD-10-CM

## 2016-08-22 DIAGNOSIS — M792 Neuralgia and neuritis, unspecified: Secondary | ICD-10-CM

## 2016-08-22 DIAGNOSIS — K219 Gastro-esophageal reflux disease without esophagitis: Secondary | ICD-10-CM

## 2016-08-22 DIAGNOSIS — K8050 Calculus of bile duct without cholangitis or cholecystitis without obstruction: Secondary | ICD-10-CM

## 2016-08-22 LAB — COMPREHENSIVE METABOLIC PANEL
ALK PHOS: 69 U/L (ref 38–126)
ALT: 14 U/L (ref 14–54)
ANION GAP: 9 (ref 5–15)
AST: 17 U/L (ref 15–41)
Albumin: 2.2 g/dL — ABNORMAL LOW (ref 3.5–5.0)
BILIRUBIN TOTAL: 0.2 mg/dL — AB (ref 0.3–1.2)
BUN: 15 mg/dL (ref 6–20)
CO2: 25 mmol/L (ref 22–32)
CREATININE: 0.72 mg/dL (ref 0.44–1.00)
Calcium: 8.7 mg/dL — ABNORMAL LOW (ref 8.9–10.3)
Chloride: 104 mmol/L (ref 101–111)
Glucose, Bld: 92 mg/dL (ref 65–99)
Potassium: 4.1 mmol/L (ref 3.5–5.1)
SODIUM: 138 mmol/L (ref 135–145)
TOTAL PROTEIN: 5.6 g/dL — AB (ref 6.5–8.1)

## 2016-08-22 LAB — CBC
HEMATOCRIT: 25.5 % — AB (ref 36.0–46.0)
HEMOGLOBIN: 8.3 g/dL — AB (ref 12.0–15.0)
MCH: 31.6 pg (ref 26.0–34.0)
MCHC: 32.5 g/dL (ref 30.0–36.0)
MCV: 97 fL (ref 78.0–100.0)
Platelets: 561 10*3/uL — ABNORMAL HIGH (ref 150–400)
RBC: 2.63 MIL/uL — AB (ref 3.87–5.11)
RDW: 14.8 % (ref 11.5–15.5)
WBC: 9.4 10*3/uL (ref 4.0–10.5)

## 2016-08-22 LAB — GLUCOSE, CAPILLARY
Glucose-Capillary: 89 mg/dL (ref 65–99)
Glucose-Capillary: 83 mg/dL (ref 65–99)

## 2016-08-22 MED ORDER — MORPHINE SULFATE (PF) 2 MG/ML IV SOLN
2.0000 mg | INTRAVENOUS | Status: DC | PRN
  Administered 2016-08-26 – 2016-08-28 (×3): 2 mg via INTRAVENOUS
  Filled 2016-08-22 (×4): qty 1

## 2016-08-22 MED ORDER — ENOXAPARIN SODIUM 40 MG/0.4ML ~~LOC~~ SOLN
40.0000 mg | SUBCUTANEOUS | Status: DC
  Administered 2016-08-22: 40 mg via SUBCUTANEOUS
  Filled 2016-08-22: qty 0.4

## 2016-08-22 NOTE — Progress Notes (Signed)
[facility] Progress Note     Subjective: Feeling better than yesterday. Less abdominal pain. No nausea. Had 2 BM's overnight.   Objective: Vital signs in last 24 hours: Temp:  [98.1 F (36.7 C)-99.2 F (37.3 C)] 98.8 F (37.1 C) (02/08 0545) Pulse Rate:  [77-100] 77 (02/08 0545) Resp:  [16-20] 18 (02/08 0545) BP: (102-133)/(52-87) 127/68 (02/08 0545) SpO2:  [95 %-99 %] 96 % (02/08 0545) Weight:  [110 lb 3.7 oz (50 kg)] 110 lb 3.7 oz (50 kg) (02/08 0700) Last BM Date: 08/22/16  Intake/Output from previous day: 02/07 0701 - 02/08 0700 In: 883.3 [P.O.:70; I.V.:603.3; NG/GT:160; IV Piggyback:50] Out: 650 [Urine:150; Emesis/NG output:200] Intake/Output this shift: No intake/output data recorded.  PE: Constitutional: Thin, frail, elderly woman who appears older than her stated age  Mouth/Throat: NG tube in place Eyes: Conjunctivae are normal.  Right eye exhibits no discharge. Left eye exhibits no discharge. No scleral icterus.  Cardiovascular: Normal rate, regular rhythm, normal heart sounds. Exam reveals no gallop and no friction rub.  No murmur heard. Pulmonary/Chest: Effort rate normal.  Abdominal: Soft. Bowel sounds hyperactive. Mild distention. She exhibits no mass. There is mild generalized tenderness. There is no rebound and no guarding.  Musculoskeletal: Normal range of motion. She exhibits no edema or deformity.  Neurological: She is alert and oriented  Skin: Skin is warm and dry. No rash noted. She is not diaphoretic.  Psychiatric: Mood and affect normal.  Nursing note and vitals reviewed.  Lab Results:   Recent Labs  08/21/16 1005 08/22/16 0527  WBC 15.0* 9.4  HGB 11.0* 8.3*  HCT 33.8* 25.5*  PLT 702* 561*   BMET  Recent Labs  08/21/16 1005 08/22/16 0527  NA 133* 138  K 4.7 4.1  CL 95* 104  CO2 27 25  GLUCOSE 118* 92  BUN 13 15  CREATININE 0.77 0.72  CALCIUM 10.4* 8.7*   PT/INR  Recent Labs  08/21/16 1732  LABPROT 13.9  INR  1.07   CMP     Component Value Date/Time   NA 138 08/22/2016 0527   K 4.1 08/22/2016 0527   CL 104 08/22/2016 0527   CO2 25 08/22/2016 0527   GLUCOSE 92 08/22/2016 0527   BUN 15 08/22/2016 0527   CREATININE 0.72 08/22/2016 0527   CALCIUM 8.7 (L) 08/22/2016 0527   PROT 5.6 (L) 08/22/2016 0527   ALBUMIN 2.2 (L) 08/22/2016 0527   AST 17 08/22/2016 0527   ALT 14 08/22/2016 0527   ALKPHOS 69 08/22/2016 0527   BILITOT 0.2 (L) 08/22/2016 0527   GFRNONAA >60 08/22/2016 0527   GFRAA >60 08/22/2016 0527   Lipase     Component Value Date/Time   LIPASE 22 08/21/2016 1005       Studies/Results: Ct Abdomen Pelvis W Contrast  Result Date: 08/21/2016 CLINICAL DATA:  Abdominal pain and distention.  Constipation. EXAM: CT ABDOMEN AND PELVIS WITH CONTRAST TECHNIQUE: Multidetector CT imaging of the abdomen and pelvis was performed using the standard protocol following bolus administration of intravenous contrast. CONTRAST:  115mL ISOVUE-300 IOPAMIDOL (ISOVUE-300) INJECTION 61% COMPARISON:  07/16/2016 FINDINGS: Lower chest: Mild atelectasis in both lower lobes. No pleural or pericardial fluid. Small hiatal hernia. Hepatobiliary: No liver parenchymal abnormality is seen. There is intra and extrahepatic biliary ductal dilatation. There are multiple stones in the gallbladder. There appears to be a filling defect in the distal common duct, non calcified, most consistent with a passing stone. Pancreas: Normal Spleen: Normal Adrenals/Urinary Tract: Normal appearance of  the kidneys except for vascular calcification. Adrenal glands are normal. Stomach/Bowel: Markedly dilated fluid filled loops of small intestine consistent with small bowel obstruction. The colon is essentially collapsed. Few loops of collapsed ileum are noted. Vascular/Lymphatic: Aortic atherosclerosis. No aneurysm. IVC is normal. Reproductive: Previous hysterectomy.  No pelvic mass. Other: Small amount of free fluid.  No free air.  Musculoskeletal: Curvature in chronic degenerative change of the spine. Superior endplate fracture of L2 is chronic. IMPRESSION: High-grade small bowel obstruction. Small amount of free fluid. No free air. Chololithiasis. Intra and extra hepatic biliary ductal dilatation. Probable choledocholithiasis with a stone or stones in the distal duct. Electronically Signed   By: Nelson Chimes M.D.   On: 08/21/2016 13:59   Dg Chest Port 1 View  Result Date: 08/21/2016 CLINICAL DATA:  Preoperative respiratory evaluation. EXAM: PORTABLE CHEST 1 VIEW COMPARISON:  08/21/2016, earlier the same day. FINDINGS: The lungs are clear wiithout focal pneumonia, edema, pneumothorax or pleural effusion. The cardiopericardial silhouette is within normal limits for size. NG tube tip is in the proximal stomach with proximal port of the NG tube in the distal esophagus. Bones are diffusely demineralized. IMPRESSION: 1. No acute cardiopulmonary findings. 2. The tip of the NG tube is just barely into the stomach with proximal port in the esophagus. Electronically Signed   By: Misty Stanley M.D.   On: 08/21/2016 18:05   Dg Abdomen Acute W/chest  Result Date: 08/21/2016 CLINICAL DATA:  Pain and abdominal distention for 2 days with nausea. EXAM: DG ABDOMEN ACUTE W/ 1V CHEST COMPARISON:  Chest radiograph 07/19/2016. FINDINGS: Frontal view of the chest shows midline trachea and normal heart size. Thoracic aorta is calcified. Biapical pleuroparenchymal scarring. No airspace consolidation or pleural fluid. There is marked gaseous distention of colon with a fair amount of stool and air-fluid levels. Gas is seen in the rectosigmoid colon. There may be bowel gas extending into the right inguinal canal. No definite small bowel dilatation. Dextroconvex scoliosis, centered near the thoracolumbar junction, with degenerative changes. IMPRESSION: 1. Bowel gas pattern is indicative of a colonic ileus. Distal colorectal obstruction cannot be excluded. 2. There  may be bowel gas extending into the right inguinal canal. Difficult to exclude a hernia. 3. CT abdomen pelvis with contrast may be helpful in further evaluation, as clinically indicated. Electronically Signed   By: Lorin Picket M.D.   On: 08/21/2016 13:34   Dg Abd Portable 1v-small Bowel Obstruction Protocol-initial, 8 Hr Delay  Result Date: 08/22/2016 CLINICAL DATA:  Small bowel obstruction.  8 hour delayed film. EXAM: PORTABLE ABDOMEN - 1 VIEW COMPARISON:  08/21/2016 FINDINGS: Enteric tube tip is in the left upper quadrant consistent with location in the stomach. The tube is kinked at the proximal side hole. Positioning is not significantly changed since prior study. Contrast material is demonstrated throughout the colon and into the rectum. Presence of contrast material in the colon and mild gaseous distention of small bowel suggests partial obstruction or ileus. IMPRESSION: Contrast material demonstrated throughout the colon. Electronically Signed   By: Lucienne Capers M.D.   On: 08/22/2016 06:24   Dg Abd Portable 1v  Result Date: 08/21/2016 CLINICAL DATA:  Nasogastric tube placement. EXAM: PORTABLE ABDOMEN - 1 VIEW COMPARISON:  Radiographs of same day. FINDINGS: Distal tip of nasogastric tube is seen in proximal stomach. Dilated small bowel loops are again noted. IMPRESSION: Distal tip of nasogastric tube is seen in expected position of proximal stomach. Dilated small bowel loops are noted concerning for distal small  bowel obstruction. Electronically Signed   By: Marijo Conception, M.D.   On: 08/21/2016 20:28   Dg Abd Portable 1 View  Result Date: 08/21/2016 CLINICAL DATA:  71 year old female enteric tube placement. Initial encounter. EXAM: PORTABLE ABDOMEN - 1 VIEW COMPARISON:  CT Abdomen and Pelvis 1342 hours today. FINDINGS: Portable AP supine view at 1646 hours. Enteric tube courses to the stomach. The side hole is in the distal thoracic esophagus. Negative lung bases. Small volume of excreted IV  contrast in the renal collecting systems stable bowel gas pattern. IMPRESSION: 1. Enteric tube side hole in the distal thoracic esophagus. Advance 8 cm to ensure side hole placement within the stomach. 2. Stable bowel gas pattern from the CT Abdomen and Pelvis today. Electronically Signed   By: Genevie Ann M.D.   On: 08/21/2016 16:57    Anti-infectives: Anti-infectives    None       Assessment/Plan  Possible small bowel obstruction - Patient hospitalized in early January 2018 for small bowel obstruction - abd film today demonstrated Contrast material in the colon, patient not fully obstructed - Clamp NG tube, if patient tolerates this throughout the day she may be advanced to clears this evening  Choledocholithiasis - GI planning possible ERCP tomorrow - Labs not concerning for obstructed CBG, normal LFTs and T bili - We'll await GIs recommendation before proceeding with possible cholecystectomy this admission  We will continue to follow this patient.    LOS: 1 day    Kalman Drape , [facility] Surgery 08/22/2016, 7:57 AM Pager: [phone] Consults: [phone] Mon-Fri 7:00 am-4:30 pm Sat-Sun 7:00 am-11:30 am

## 2016-08-22 NOTE — Progress Notes (Signed)
PROGRESS NOTE        PATIENT DETAILS Name: Ashley Howard Age: 71 y.o. Sex: female Date of Birth: 02-07-1946 Admit Date: 08/21/2016 Admitting Physician Waldemar Dickens, MD [id number] [name], MD  Brief Narrative: Patient is a 71 y.o. female with prior history of SBO (most recently in January 2018), dyslipidemia, GERD admitted on 2/7 with abdominal pain. Subsequently found to have a small bowel obstruction and a choledocholithiasis. See below for further details.  Subjective: Feels better. Had a bowel movement early this morning. NG tube still in place.  Assessment/Plan: Small bowel obstruction: Improving, had a bowel movement early this morning. Gen. surgery following, plans are to clamp NG tube and allow sips of clear liquids today. Abdomen is soft and nontender this morning.  Choledocholithiasis: Normal LFTs, gastroenterology following and contemplating ERCP once more clinically improved.  GERD: Continue Pepcid.  Anemia: Suspect hemoglobin of 11.0 on admission was due to hemoconcentration, recent hemoglobin appears to be around 9. Slight drop in hemoglobin likely due to IV fluid dilution.  Urinary incontinence: s/p bladder surgery, continue ditropan when taking PO  HLD: continue statin when taking PO  CHronic Neuropathic pain: resume neurontin when taking PO  Protein-calorie malnutrition, severe: Start supplements when no longer nothing by mouth.  DVT Prophylaxis: Prophylactic Lovenox   Code Status: Full code   Family Communication: None at bedside  Disposition Plan: Remain inpatient-home in a few days  Antimicrobial agents: Anti-infectives    None     Procedures: None  CONSULTS:  GI and general surgery  Time spent: 25- minutes-Greater than 50% of this time was spent in counseling, explanation of diagnosis, planning of further management, and coordination of care.  MEDICATIONS: Scheduled Meds: . famotidine (PEPCID) IV  20 mg  Intravenous Q24H   Continuous Infusions: . dextrose 5 % and 0.9% NaCl 50 mL/hr at 08/21/16 1852   PRN Meds:.acetaminophen **OR** acetaminophen, morphine injection, ondansetron **OR** ondansetron (ZOFRAN) IV   PHYSICAL EXAM: Vital signs: Vitals:   08/21/16 2042 08/22/16 0545 08/22/16 0700 08/22/16 1016  BP: (!) 102/52 127/68  (!) 107/53  Pulse: 80 77  63  Resp: 18 18  17   Temp: 98.2 F (36.8 C) 98.8 F (37.1 C)  97.3 F (36.3 C)  TempSrc: Oral Oral  Oral  SpO2: 95% 96%  97%  Weight:   50 kg (110 lb 3.7 oz)   Height:   4\' 11"  (1.499 m)    Filed Weights   08/22/16 0700  Weight: 50 kg (110 lb 3.7 oz)   Body mass index is 22.26 kg/m.   General appearance :Awake, alert, not in any distress. Speech Clear.  Eyes:, pupils equally reactive to light and accomodation,no scleral icterus. HEENT: Atraumatic and Normocephalic Neck: supple, no JVD.  Resp:Good air entry bilaterally, no added sounds  CVS: S1 S2 regular, no murmurs.  GI: Bowel sounds +, Non tender and not distended with no gaurding, rigidity or rebound.No organomegaly Extremities: B/L Lower Ext shows no edema, both legs are warm to touch Neurology:  speech clear,Non focal, sensation is grossly intact. Psychiatric: Normal judgment and insight. Alert and oriented x 3. Normal mood. Musculoskeletal:No digital cyanosis Skin:No Rash, warm and dry Wounds:N/A  I have personally reviewed following labs and imaging studies  LABORATORY DATA: CBC:  Recent Labs Lab 08/21/16 1005 08/22/16 0527  WBC 15.0* 9.4  HGB 11.0* 8.3*  HCT 33.8* 25.5*  MCV 95.8 97.0  PLT 702* 561*    Basic Metabolic Panel:  Recent Labs Lab 08/21/16 1005 08/22/16 0527  NA 133* 138  K 4.7 4.1  CL 95* 104  CO2 27 25  GLUCOSE 118* 92  BUN 13 15  CREATININE 0.77 0.72  CALCIUM 10.4* 8.7*    GFR: Estimated Creatinine Clearance: 43.4 mL/min (by C-G formula based on SCr of 0.72 mg/dL).  Liver Function Tests:  Recent Labs Lab  08/21/16 1005 08/22/16 0527  AST 26 17  ALT 19 14  ALKPHOS 97 69  BILITOT 0.4 0.2*  PROT 7.5 5.6*  ALBUMIN 3.1* 2.2*    Recent Labs Lab 08/21/16 1005  LIPASE 22   No results for input(s): AMMONIA in the last 168 hours.  Coagulation Profile:  Recent Labs Lab 08/21/16 1732  INR 1.07    Cardiac Enzymes: No results for input(s): CKTOTAL, CKMB, CKMBINDEX, TROPONINI in the last 168 hours.  BNP (last 3 results) No results for input(s): PROBNP in the last 8760 hours.  HbA1C: No results for input(s): HGBA1C in the last 72 hours.  CBG:  Recent Labs Lab 08/22/16 0226 08/22/16 0817  GLUCAP 89 83    Lipid Profile: No results for input(s): CHOL, HDL, LDLCALC, TRIG, CHOLHDL, LDLDIRECT in the last 72 hours.  Thyroid Function Tests: No results for input(s): TSH, T4TOTAL, FREET4, T3FREE, THYROIDAB in the last 72 hours.  Anemia Panel: No results for input(s): VITAMINB12, FOLATE, FERRITIN, TIBC, IRON, RETICCTPCT in the last 72 hours.  Urine analysis:    Component Value Date/Time   COLORURINE YELLOW 08/21/2016 1650   APPEARANCEUR CLOUDY (A) 08/21/2016 1650   LABSPEC >1.046 (H) 08/21/2016 1650   PHURINE 6.0 08/21/2016 1650   GLUCOSEU NEGATIVE 08/21/2016 1650   HGBUR NEGATIVE 08/21/2016 1650   BILIRUBINUR NEGATIVE 08/21/2016 1650   KETONESUR NEGATIVE 08/21/2016 1650   PROTEINUR NEGATIVE 08/21/2016 1650   UROBILINOGEN 0.2 06/27/2016 1216   NITRITE NEGATIVE 08/21/2016 1650   LEUKOCYTESUR NEGATIVE 08/21/2016 1650    Sepsis Labs: Lactic Acid, Venous    Component Value Date/Time   LATICACIDVEN 0.58 08/21/2016 1604    MICROBIOLOGY: No results found for this or any previous visit (from the past 240 hour(s)).  RADIOLOGY STUDIES/RESULTS: Ct Abdomen Pelvis W Contrast  Result Date: 08/21/2016 CLINICAL DATA:  Abdominal pain and distention.  Constipation. EXAM: CT ABDOMEN AND PELVIS WITH CONTRAST TECHNIQUE: Multidetector CT imaging of the abdomen and pelvis was performed  using the standard protocol following bolus administration of intravenous contrast. CONTRAST:  172mL ISOVUE-300 IOPAMIDOL (ISOVUE-300) INJECTION 61% COMPARISON:  07/16/2016 FINDINGS: Lower chest: Mild atelectasis in both lower lobes. No pleural or pericardial fluid. Small hiatal hernia. Hepatobiliary: No liver parenchymal abnormality is seen. There is intra and extrahepatic biliary ductal dilatation. There are multiple stones in the gallbladder. There appears to be a filling defect in the distal common duct, non calcified, most consistent with a passing stone. Pancreas: Normal Spleen: Normal Adrenals/Urinary Tract: Normal appearance of the kidneys except for vascular calcification. Adrenal glands are normal. Stomach/Bowel: Markedly dilated fluid filled loops of small intestine consistent with small bowel obstruction. The colon is essentially collapsed. Few loops of collapsed ileum are noted. Vascular/Lymphatic: Aortic atherosclerosis. No aneurysm. IVC is normal. Reproductive: Previous hysterectomy.  No pelvic mass. Other: Small amount of free fluid.  No free air. Musculoskeletal: Curvature in chronic degenerative change of the spine. Superior endplate fracture of L2 is chronic. IMPRESSION: High-grade small bowel obstruction. Small amount of free fluid. No  free air. Chololithiasis. Intra and extra hepatic biliary ductal dilatation. Probable choledocholithiasis with a stone or stones in the distal duct. Electronically Signed   By: Nelson Chimes M.D.   On: 08/21/2016 13:59   Dg Chest Port 1 View  Result Date: 08/21/2016 CLINICAL DATA:  Preoperative respiratory evaluation. EXAM: PORTABLE CHEST 1 VIEW COMPARISON:  08/21/2016, earlier the same day. FINDINGS: The lungs are clear wiithout focal pneumonia, edema, pneumothorax or pleural effusion. The cardiopericardial silhouette is within normal limits for size. NG tube tip is in the proximal stomach with proximal port of the NG tube in the distal esophagus. Bones are  diffusely demineralized. IMPRESSION: 1. No acute cardiopulmonary findings. 2. The tip of the NG tube is just barely into the stomach with proximal port in the esophagus. Electronically Signed   By: Misty Stanley M.D.   On: 08/21/2016 18:05   Dg Abdomen Acute W/chest  Result Date: 08/21/2016 CLINICAL DATA:  Pain and abdominal distention for 2 days with nausea. EXAM: DG ABDOMEN ACUTE W/ 1V CHEST COMPARISON:  Chest radiograph 07/19/2016. FINDINGS: Frontal view of the chest shows midline trachea and normal heart size. Thoracic aorta is calcified. Biapical pleuroparenchymal scarring. No airspace consolidation or pleural fluid. There is marked gaseous distention of colon with a fair amount of stool and air-fluid levels. Gas is seen in the rectosigmoid colon. There may be bowel gas extending into the right inguinal canal. No definite small bowel dilatation. Dextroconvex scoliosis, centered near the thoracolumbar junction, with degenerative changes. IMPRESSION: 1. Bowel gas pattern is indicative of a colonic ileus. Distal colorectal obstruction cannot be excluded. 2. There may be bowel gas extending into the right inguinal canal. Difficult to exclude a hernia. 3. CT abdomen pelvis with contrast may be helpful in further evaluation, as clinically indicated. Electronically Signed   By: Lorin Picket M.D.   On: 08/21/2016 13:34   Dg Abd Portable 1v-small Bowel Obstruction Protocol-initial, 8 Hr Delay  Result Date: 08/22/2016 CLINICAL DATA:  Small bowel obstruction.  8 hour delayed film. EXAM: PORTABLE ABDOMEN - 1 VIEW COMPARISON:  08/21/2016 FINDINGS: Enteric tube tip is in the left upper quadrant consistent with location in the stomach. The tube is kinked at the proximal side hole. Positioning is not significantly changed since prior study. Contrast material is demonstrated throughout the colon and into the rectum. Presence of contrast material in the colon and mild gaseous distention of small bowel suggests partial  obstruction or ileus. IMPRESSION: Contrast material demonstrated throughout the colon. Electronically Signed   By: Lucienne Capers M.D.   On: 08/22/2016 06:24   Dg Abd Portable 1v  Result Date: 08/21/2016 CLINICAL DATA:  Nasogastric tube placement. EXAM: PORTABLE ABDOMEN - 1 VIEW COMPARISON:  Radiographs of same day. FINDINGS: Distal tip of nasogastric tube is seen in proximal stomach. Dilated small bowel loops are again noted. IMPRESSION: Distal tip of nasogastric tube is seen in expected position of proximal stomach. Dilated small bowel loops are noted concerning for distal small bowel obstruction. Electronically Signed   By: Marijo Conception, M.D.   On: 08/21/2016 20:28   Dg Abd Portable 1 View  Result Date: 08/21/2016 CLINICAL DATA:  71 year old female enteric tube placement. Initial encounter. EXAM: PORTABLE ABDOMEN - 1 VIEW COMPARISON:  CT Abdomen and Pelvis 1342 hours today. FINDINGS: Portable AP supine view at 1646 hours. Enteric tube courses to the stomach. The side hole is in the distal thoracic esophagus. Negative lung bases. Small volume of excreted IV contrast in the renal  collecting systems stable bowel gas pattern. IMPRESSION: 1. Enteric tube side hole in the distal thoracic esophagus. Advance 8 cm to ensure side hole placement within the stomach. 2. Stable bowel gas pattern from the CT Abdomen and Pelvis today. Electronically Signed   By: Genevie Ann M.D.   On: 08/21/2016 16:57     LOS: 1 day   Oren Binet, MD  [facility] Pager:[phone]  If 7PM-7AM, please contact night-coverage [email or web address] Password [email or web address] 08/22/2016, 1:58 PM

## 2016-08-22 NOTE — Progress Notes (Signed)
Initial Nutrition Assessment  DOCUMENTATION CODES:   Severe malnutrition in context of acute illness/injury  INTERVENTION:  Due to NPO status, no interventions available at this time. When patient is no longer NPO, recommend Ensure Enlive TID with meals. Each supplement provides 350 calories and 20 grams of protein.  NUTRITION DIAGNOSIS:   Malnutrition related to acute illness as evidenced by severe depletion of muscle mass, severe depletion of body fat.   GOAL:   Patient will meet greater than or equal to 90% of their needs  MONITOR:   I & O's, Labs, Weight trends, Diet advancement  REASON FOR ASSESSMENT:   Malnutrition Screening Tool    ASSESSMENT:   71 yo female presenting with 2 day history of acute onset abdominal pain and distension. PMHx significant for hiatal hernia, urinary incontinense, scoliosis, SBO, appendectomy, hysterectomy, and bladder surgery.  Patient reported appetite PTA had been poor due to dislike of taste of food availabile at SNF. Per patient report, SNF used spice on all foods that patient disliked. Patient reported she weighs "about 90 something pounds", but per patient chart, weight today was 110 lb. Patient's abdomen appeared extremely distended.  Patient reported prior admission in ICU at [facility] in January which resulted in a 5 lb weight loss over the course of one weekend. Patient reported losing weight PTA at [facility] due to taking care of her husband resulting in extreme stress. Patient unable to report UBW, but stated her heaviest weight was 129 lb (this was in 03/2014 per patient chart).  Patient reported really enjoying Ensure Enlive supplements at [facility]. Patient requested these supplements during stay at [facility] when she is no longer NPO.  Nutrition focused physical exam found severe muscle and fat depletion.  Meds Reviewed: Pepcid Labs Reviewed: Calcium 8.7, Albumin 2.2  Diet Order:  Diet NPO time specified Except for: Ice Chips,  Sips with Meds  Skin:  Reviewed, no issues  Last BM:  2/8  Height:   Ht Readings from Last 1 Encounters:  08/22/16 4\' 11"  (1.499 m)    Weight:   Wt Readings from Last 1 Encounters:  08/22/16 110 lb 3.7 oz (50 kg)    Ideal Body Weight:  43.9 kg  BMI:  Body mass index is 22.26 kg/m.  Estimated Nutritional Needs:   Kcal:  1500-1700 kcal (30 kcal/kg)  Protein:  75-85 grams (1.5 g/kg)  Fluid:  1.5-1.7 L/day  EDUCATION NEEDS:   No education needs identified at this time  Juliann Pulse M.S. Nutrition Dietetic Intern

## 2016-08-22 NOTE — Progress Notes (Signed)
Daily Rounding Note  08/22/2016, 8:19 AM  LOS: 1 day   SUBJECTIVE:   Chief complaint: abdominal pain and distention.  Pain is better, now primarily on left side.  No N/V with NGT in place.  Passing liquid stools   OBJECTIVE:         Vital signs in last 24 hours:    Temp:  [98.1 F (36.7 C)-99.2 F (37.3 C)] 98.8 F (37.1 C) (02/08 0545) Pulse Rate:  [77-100] 77 (02/08 0545) Resp:  [16-20] 18 (02/08 0545) BP: (102-133)/(52-87) 127/68 (02/08 0545) SpO2:  [95 %-99 %] 96 % (02/08 0545) Weight:  [50 kg (110 lb 3.7 oz)] 50 kg (110 lb 3.7 oz) (02/08 0700) Last BM Date: 08/22/16 Filed Weights   08/22/16 0700  Weight: 50 kg (110 lb 3.7 oz)   General: thin, chronically ill looking   Heart: RRR Chest: clear bil.  Productive cough.  Mild dyspnea with speach Abdomen: distended moderately tense, mild left side tenderness.  BS hypoactive, not tinkling or typanitic  Extremities: no CCE Neuro/Psych:  Oriented x 3.  Rapid speech.  Moves all 4 limbs.     Recent Labs  08/21/16 1005 08/22/16 0527  WBC 15.0* 9.4  HGB 11.0* 8.3*  HCT 33.8* 25.5*  PLT 702* 561*   BMET  Recent Labs  08/21/16 1005 08/22/16 0527  NA 133* 138  K 4.7 4.1  CL 95* 104  CO2 27 25  GLUCOSE 118* 92  BUN 13 15  CREATININE 0.77 0.72  CALCIUM 10.4* 8.7*   LFT  Recent Labs  08/21/16 1005 08/22/16 0527  PROT 7.5 5.6*  ALBUMIN 3.1* 2.2*  AST 26 17  ALT 19 14  ALKPHOS 97 69  BILITOT 0.4 0.2*   PT/INR  Recent Labs  08/21/16 1732  LABPROT 13.9  INR 1.07   Hepatitis Panel No results for input(s): HEPBSAG, HCVAB, HEPAIGM, HEPBIGM in the last 72 hours.  Studies/Results:  Dg Abd Portable 1v-small Bowel Obstruction Protocol-initial, 8 Hr Delay  Result Date: 08/22/2016 CLINICAL DATA:  Small bowel obstruction.  8 hour delayed film. EXAM: PORTABLE ABDOMEN - 1 VIEW COMPARISON:  08/21/2016 FINDINGS: Enteric tube tip is in the left upper  quadrant consistent with location in the stomach. The tube is kinked at the proximal side hole. Positioning is not significantly changed since prior study. Contrast material is demonstrated throughout the colon and into the rectum. Presence of contrast material in the colon and mild gaseous distention of small bowel suggests partial obstruction or ileus. IMPRESSION: Contrast material demonstrated throughout the colon. Electronically Signed   By: Lucienne Capers M.D.   On: 08/22/2016 06:24     ASSESMENT:   *  SBO in pt with hx same and s/p multile abd surgeries.  As contrast has reached the colon this is now improved to PSBO.    *  Choledocholithiasis. Normal LFTs and Lipase.      *  Normocytic anemia.  FOBT + on 1 of 2 tests.    PLAN   *  Mgt of NGT per Surgery  * Gallstone disease to be addressed after resolution of her SBO  Azucena Freed  08/22/2016, 8:19 AM Pager: [phone]  ________________________________________________________________________  Velora Heckler GI MD note:  I personally examined the patient, reviewed the data and agree with the assessment and plan described above.  I don't think the gallstone disease is contributing to her pSBO.  Cannot address CBD stones until the SBO is resolved (  inpatient or outpatient to be determined).   Owens Loffler, MD [facility] Pager [phone]

## 2016-08-22 NOTE — Progress Notes (Signed)
Gastrografin was administered around 2130 after advancing NGT by 8 cm and xray confirming correct placement in stomach, NGT clamped for 1.5 hr during/after administration of gastrografin.  Patient had an incontinent episode of loose medium BM, brown/green color around 0127.  Patient resting comfortably in bed with both eyes closed.

## 2016-08-23 ENCOUNTER — Encounter (HOSPITAL_COMMUNITY)
Admission: EM | Disposition: A | Discharge status: Home Health | Payer: Self-pay | Source: Home / Self Care | Attending: Internal Medicine

## 2016-08-23 DIAGNOSIS — E43 Unspecified severe protein-calorie malnutrition: Secondary | ICD-10-CM

## 2016-08-23 LAB — COMPREHENSIVE METABOLIC PANEL
ALT: 11 U/L — AB (ref 14–54)
ANION GAP: 9 (ref 5–15)
AST: 20 U/L (ref 15–41)
Albumin: 2.3 g/dL — ABNORMAL LOW (ref 3.5–5.0)
Alkaline Phosphatase: 64 U/L (ref 38–126)
BUN: 8 mg/dL (ref 6–20)
CHLORIDE: 101 mmol/L (ref 101–111)
CO2: 26 mmol/L (ref 22–32)
Calcium: 8.9 mg/dL (ref 8.9–10.3)
Creatinine, Ser: 0.65 mg/dL (ref 0.44–1.00)
Glucose, Bld: 86 mg/dL (ref 65–99)
Potassium: 3.5 mmol/L (ref 3.5–5.1)
SODIUM: 136 mmol/L (ref 135–145)
Total Bilirubin: 0.8 mg/dL (ref 0.3–1.2)
Total Protein: 5.6 g/dL — ABNORMAL LOW (ref 6.5–8.1)

## 2016-08-23 LAB — CBC
HCT: 26.9 % — ABNORMAL LOW (ref 36.0–46.0)
HEMOGLOBIN: 8.6 g/dL — AB (ref 12.0–15.0)
MCH: 31.5 pg (ref 26.0–34.0)
MCHC: 32 g/dL (ref 30.0–36.0)
MCV: 98.5 fL (ref 78.0–100.0)
PLATELETS: 582 10*3/uL — AB (ref 150–400)
RBC: 2.73 MIL/uL — AB (ref 3.87–5.11)
RDW: 14.9 % (ref 11.5–15.5)
WBC: 7.3 10*3/uL (ref 4.0–10.5)

## 2016-08-23 LAB — GLUCOSE, CAPILLARY
GLUCOSE-CAPILLARY: 86 mg/dL (ref 65–99)
Glucose-Capillary: 171 mg/dL — ABNORMAL HIGH (ref 65–99)

## 2016-08-23 SURGERY — ENDOSCOPIC RETROGRADE CHOLANGIOPANCREATOGRAPHY (ERCP) WITH PROPOFOL
Anesthesia: Monitor Anesthesia Care

## 2016-08-23 MED ORDER — SODIUM CHLORIDE 0.9 % IV SOLN
INTRAVENOUS | Status: DC
  Administered 2016-08-24: 06:00:00 via INTRAVENOUS

## 2016-08-23 MED ORDER — ENSURE ENLIVE PO LIQD
237.0000 mL | Freq: Three times a day (TID) | ORAL | Status: DC
  Administered 2016-08-23 – 2016-08-29 (×13): 237 mL via ORAL

## 2016-08-23 MED ORDER — OXYBUTYNIN CHLORIDE 5 MG PO TABS
5.0000 mg | ORAL_TABLET | Freq: Two times a day (BID) | ORAL | Status: DC
  Administered 2016-08-23 – 2016-08-29 (×13): 5 mg via ORAL
  Filled 2016-08-23 (×14): qty 1

## 2016-08-23 MED ORDER — FAMOTIDINE 20 MG PO TABS
20.0000 mg | ORAL_TABLET | Freq: Every day | ORAL | Status: AC
  Administered 2016-08-23 – 2016-08-27 (×5): 20 mg via ORAL
  Filled 2016-08-23 (×6): qty 1

## 2016-08-23 MED ORDER — GABAPENTIN 300 MG PO CAPS
300.0000 mg | ORAL_CAPSULE | Freq: Three times a day (TID) | ORAL | Status: DC
  Administered 2016-08-23 – 2016-08-29 (×17): 300 mg via ORAL
  Filled 2016-08-23 (×17): qty 1

## 2016-08-23 MED ORDER — SIMETHICONE 80 MG PO CHEW: 80.0000 mg | CHEWABLE_TABLET | Freq: Four times a day (QID) | ORAL | Status: DC | PRN

## 2016-08-23 MED ORDER — PRAVASTATIN SODIUM 10 MG PO TABS
20.0000 mg | ORAL_TABLET | Freq: Every day | ORAL | Status: DC
  Administered 2016-08-23 – 2016-08-28 (×6): 20 mg via ORAL
  Filled 2016-08-23 (×6): qty 2

## 2016-08-23 NOTE — Progress Notes (Signed)
PROGRESS NOTE        PATIENT DETAILS Name: Ashley Howard Age: 71 y.o. Sex: female Date of Birth: [birth year] Admit Date: 08/21/2016 Admitting Physician Waldemar Dickens, MD [id number] [name], MD  Brief Narrative: Patient is a 71 y.o. female with prior history of SBO (most recently in January 2018), dyslipidemia, GERD admitted on 2/7 with abdominal pain. Subsequently found to have a small bowel obstruction and a choledocholithiasis. See below for further details.  Subjective: NGT removed-had numerous bowel movements oriented this morning. Does not have any further abdominal pain.   Assessment/Plan: Small bowel obstruction: Seems to have resolved-has had multiple bowel movements this morning. NG tube has been removed. Gen. surgery continues to follow.   Choledocholithiasis: Normal LFTs, gastroenterology planning ERCP tomorrow morning, following which patient will most likely require a cholecystectomy. Both GI and general surgery following.  GERD: Continue Pepcid.  Anemia: Suspect hemoglobin of 11.0 on admission was due to hemoconcentration, recent hemoglobin appears to be around 9. Slight drop in hemoglobin likely due to IV fluid dilution. Hemoglobin appears stable-follow  Urinary incontinence: s/p bladder surgery, continue ditropan   HLD: continue statin   CHronic Neuropathic pain: resume neurontin  Protein-calorie malnutrition, severe: Start supplements   DVT Prophylaxis: SCD's in anticipation of ERCP  Code Status: Full code   Family Communication: None at bedside  Disposition Plan: Remain inpatient-back to SNF sometime early next week  Antimicrobial agents: Anti-infectives    None     Procedures: None  CONSULTS:  GI and general surgery  Time spent: 25- minutes-Greater than 50% of this time was spent in counseling, explanation of diagnosis, planning of further management, and coordination of care.  MEDICATIONS: Scheduled Meds: .  famotidine (PEPCID) IV  20 mg Intravenous Q24H   Continuous Infusions: . dextrose 5 % and 0.9% NaCl 50 mL/hr at 08/23/16 0837   PRN Meds:.acetaminophen **OR** acetaminophen, morphine injection, ondansetron **OR** ondansetron (ZOFRAN) IV   PHYSICAL EXAM: Vital signs: Vitals:   08/22/16 1423 08/22/16 2220 08/23/16 0520 08/23/16 0542  BP: 124/78 112/64 (!) 85/66 (!) 109/59  Pulse: 65 64 71   Resp: 20 18 18    Temp: 97.7 F (36.5 C) 98.3 F (36.8 C) 98 F (36.7 C)   TempSrc: Oral Oral Oral   SpO2: 98% 98% 99%   Weight:      Height:       Filed Weights   08/22/16 0700  Weight: 50 kg (110 lb 3.7 oz)   Body mass index is 22.26 kg/m.   General appearance :Awake, alert, not in any distress. Speech Clear.  Eyes:, pupils equally reactive to light and accomodation,no scleral icterus. HEENT: Atraumatic and Normocephalic Neck: supple, no JVD.  Resp:Good air entry bilaterally, no added sounds  CVS: S1 S2 regular, no murmurs.  GI: Bowel sounds +, Non tender and not distended with no gaurding, rigidity or rebound.No organomegaly Extremities: B/L Lower Ext shows no edema, both legs are warm to touch Neurology:  speech clear,Non focal, sensation is grossly intact. Psychiatric: Normal judgment and insight. Alert and oriented x 3. Normal mood. Musculoskeletal:No digital cyanosis Skin:No Rash, warm and dry Wounds:N/A  I have personally reviewed following labs and imaging studies  LABORATORY DATA: CBC:  Recent Labs Lab 08/21/16 1005 08/22/16 0527 08/23/16 0554  WBC 15.0* 9.4 7.3  HGB 11.0* 8.3* 8.6*  HCT 33.8* 25.5* 26.9*  MCV 95.8 97.0 98.5  PLT 702* 561* 582*    Basic Metabolic Panel:  Recent Labs Lab 08/21/16 1005 08/22/16 0527 08/23/16 0554  NA 133* 138 136  K 4.7 4.1 3.5  CL 95* 104 101  CO2 27 25 26   GLUCOSE 118* 92 86  BUN 13 15 8   CREATININE 0.77 0.72 0.65  CALCIUM 10.4* 8.7* 8.9    GFR: Estimated Creatinine Clearance: 43.4 mL/min (by C-G formula based  on SCr of 0.65 mg/dL).  Liver Function Tests:  Recent Labs Lab 08/21/16 1005 08/22/16 0527 08/23/16 0554  AST 26 17 20   ALT 19 14 11*  ALKPHOS 97 69 64  BILITOT 0.4 0.2* 0.8  PROT 7.5 5.6* 5.6*  ALBUMIN 3.1* 2.2* 2.3*    Recent Labs Lab 08/21/16 1005  LIPASE 22   No results for input(s): AMMONIA in the last 168 hours.  Coagulation Profile:  Recent Labs Lab 08/21/16 1732  INR 1.07    Cardiac Enzymes: No results for input(s): CKTOTAL, CKMB, CKMBINDEX, TROPONINI in the last 168 hours.  BNP (last 3 results) No results for input(s): PROBNP in the last 8760 hours.  HbA1C: No results for input(s): HGBA1C in the last 72 hours.  CBG:  Recent Labs Lab 08/22/16 0226 08/22/16 0817 08/23/16 0019 08/23/16 1138  GLUCAP 89 83 86 171*    Lipid Profile: No results for input(s): CHOL, HDL, LDLCALC, TRIG, CHOLHDL, LDLDIRECT in the last 72 hours.  Thyroid Function Tests: No results for input(s): TSH, T4TOTAL, FREET4, T3FREE, THYROIDAB in the last 72 hours.  Anemia Panel: No results for input(s): VITAMINB12, FOLATE, FERRITIN, TIBC, IRON, RETICCTPCT in the last 72 hours.  Urine analysis:    Component Value Date/Time   COLORURINE YELLOW 08/21/2016 1650   APPEARANCEUR CLOUDY (A) 08/21/2016 1650   LABSPEC >1.046 (H) 08/21/2016 1650   PHURINE 6.0 08/21/2016 1650   GLUCOSEU NEGATIVE 08/21/2016 1650   HGBUR NEGATIVE 08/21/2016 1650   BILIRUBINUR NEGATIVE 08/21/2016 1650   KETONESUR NEGATIVE 08/21/2016 1650   PROTEINUR NEGATIVE 08/21/2016 1650   UROBILINOGEN 0.2 06/27/2016 1216   NITRITE NEGATIVE 08/21/2016 1650   LEUKOCYTESUR NEGATIVE 08/21/2016 1650    Sepsis Labs: Lactic Acid, Venous    Component Value Date/Time   LATICACIDVEN 0.58 08/21/2016 1604    MICROBIOLOGY: No results found for this or any previous visit (from the past 240 hour(s)).  RADIOLOGY STUDIES/RESULTS: Ct Abdomen Pelvis W Contrast  Result Date: 08/21/2016 CLINICAL DATA:  Abdominal pain and  distention.  Constipation. EXAM: CT ABDOMEN AND PELVIS WITH CONTRAST TECHNIQUE: Multidetector CT imaging of the abdomen and pelvis was performed using the standard protocol following bolus administration of intravenous contrast. CONTRAST:  11mL ISOVUE-300 IOPAMIDOL (ISOVUE-300) INJECTION 61% COMPARISON:  07/16/2016 FINDINGS: Lower chest: Mild atelectasis in both lower lobes. No pleural or pericardial fluid. Small hiatal hernia. Hepatobiliary: No liver parenchymal abnormality is seen. There is intra and extrahepatic biliary ductal dilatation. There are multiple stones in the gallbladder. There appears to be a filling defect in the distal common duct, non calcified, most consistent with a passing stone. Pancreas: Normal Spleen: Normal Adrenals/Urinary Tract: Normal appearance of the kidneys except for vascular calcification. Adrenal glands are normal. Stomach/Bowel: Markedly dilated fluid filled loops of small intestine consistent with small bowel obstruction. The colon is essentially collapsed. Few loops of collapsed ileum are noted. Vascular/Lymphatic: Aortic atherosclerosis. No aneurysm. IVC is normal. Reproductive: Previous hysterectomy.  No pelvic mass. Other: Small amount of free fluid.  No free air. Musculoskeletal: Curvature in chronic degenerative  change of the spine. Superior endplate fracture of L2 is chronic. IMPRESSION: High-grade small bowel obstruction. Small amount of free fluid. No free air. Chololithiasis. Intra and extra hepatic biliary ductal dilatation. Probable choledocholithiasis with a stone or stones in the distal duct. Electronically Signed   By: Nelson Chimes M.D.   On: 08/21/2016 13:59   Dg Chest Port 1 View  Result Date: 08/21/2016 CLINICAL DATA:  Preoperative respiratory evaluation. EXAM: PORTABLE CHEST 1 VIEW COMPARISON:  08/21/2016, earlier the same day. FINDINGS: The lungs are clear wiithout focal pneumonia, edema, pneumothorax or pleural effusion. The cardiopericardial silhouette  is within normal limits for size. NG tube tip is in the proximal stomach with proximal port of the NG tube in the distal esophagus. Bones are diffusely demineralized. IMPRESSION: 1. No acute cardiopulmonary findings. 2. The tip of the NG tube is just barely into the stomach with proximal port in the esophagus. Electronically Signed   By: Misty Stanley M.D.   On: 08/21/2016 18:05   Dg Abdomen Acute W/chest  Result Date: 08/21/2016 CLINICAL DATA:  Pain and abdominal distention for 2 days with nausea. EXAM: DG ABDOMEN ACUTE W/ 1V CHEST COMPARISON:  Chest radiograph 07/19/2016. FINDINGS: Frontal view of the chest shows midline trachea and normal heart size. Thoracic aorta is calcified. Biapical pleuroparenchymal scarring. No airspace consolidation or pleural fluid. There is marked gaseous distention of colon with a fair amount of stool and air-fluid levels. Gas is seen in the rectosigmoid colon. There may be bowel gas extending into the right inguinal canal. No definite small bowel dilatation. Dextroconvex scoliosis, centered near the thoracolumbar junction, with degenerative changes. IMPRESSION: 1. Bowel gas pattern is indicative of a colonic ileus. Distal colorectal obstruction cannot be excluded. 2. There may be bowel gas extending into the right inguinal canal. Difficult to exclude a hernia. 3. CT abdomen pelvis with contrast may be helpful in further evaluation, as clinically indicated. Electronically Signed   By: Lorin Picket M.D.   On: 08/21/2016 13:34   Dg Abd Portable 1v-small Bowel Obstruction Protocol-initial, 8 Hr Delay  Result Date: 08/22/2016 CLINICAL DATA:  Small bowel obstruction.  8 hour delayed film. EXAM: PORTABLE ABDOMEN - 1 VIEW COMPARISON:  08/21/2016 FINDINGS: Enteric tube tip is in the left upper quadrant consistent with location in the stomach. The tube is kinked at the proximal side hole. Positioning is not significantly changed since prior study. Contrast material is demonstrated  throughout the colon and into the rectum. Presence of contrast material in the colon and mild gaseous distention of small bowel suggests partial obstruction or ileus. IMPRESSION: Contrast material demonstrated throughout the colon. Electronically Signed   By: Lucienne Capers M.D.   On: 08/22/2016 06:24   Dg Abd Portable 1v  Result Date: 08/21/2016 CLINICAL DATA:  Nasogastric tube placement. EXAM: PORTABLE ABDOMEN - 1 VIEW COMPARISON:  Radiographs of same day. FINDINGS: Distal tip of nasogastric tube is seen in proximal stomach. Dilated small bowel loops are again noted. IMPRESSION: Distal tip of nasogastric tube is seen in expected position of proximal stomach. Dilated small bowel loops are noted concerning for distal small bowel obstruction. Electronically Signed   By: Marijo Conception, M.D.   On: 08/21/2016 20:28   Dg Abd Portable 1 View  Result Date: 08/21/2016 CLINICAL DATA:  71 year old female enteric tube placement. Initial encounter. EXAM: PORTABLE ABDOMEN - 1 VIEW COMPARISON:  CT Abdomen and Pelvis 1342 hours today. FINDINGS: Portable AP supine view at 1646 hours. Enteric tube courses to the  stomach. The side hole is in the distal thoracic esophagus. Negative lung bases. Small volume of excreted IV contrast in the renal collecting systems stable bowel gas pattern. IMPRESSION: 1. Enteric tube side hole in the distal thoracic esophagus. Advance 8 cm to ensure side hole placement within the stomach. 2. Stable bowel gas pattern from the CT Abdomen and Pelvis today. Electronically Signed   By: Genevie Ann M.D.   On: 08/21/2016 16:57     LOS: 2 days   Oren Binet, MD  [facility] Pager:[phone]  If 7PM-7AM, please contact night-coverage [email or web address] Password [email or web address] 08/23/2016, 1:21 PM

## 2016-08-23 NOTE — Progress Notes (Signed)
[facility] Progress Note     Subjective: Pt tolerating sips. GI advanced diet to clears. Abdominal pain and distention improved. No nausea or vomiting overnight. Pt had watery BM.   Objective: Vital signs in last 24 hours: Temp:  [97.3 F (36.3 C)-98.3 F (36.8 C)] 98 F (36.7 C) (02/09 0520) Pulse Rate:  [63-71] 71 (02/09 0520) Resp:  [17-20] 18 (02/09 0520) BP: (85-124)/(53-78) 109/59 (02/09 0542) SpO2:  [97 %-99 %] 99 % (02/09 0520) Last BM Date: 08/22/16  Intake/Output from previous day: 02/08 0701 - 02/09 0700 In: 1093.3 [P.O.:90; I.V.:1003.3] Out: 400 [Urine:400] Intake/Output this shift: No intake/output data recorded.  PE: Constitutional: Thin, frail, elderly woman who appears older than her stated age Eyes: Conjunctivaeare normal.  No scleral icterus.  Cardiovascular: Normal rate, regular rhythm, normal heart sounds. Exam reveals no gallopand no friction rub. No murmurheard. Pulmonary/Chest: Effort and rate normal.  Abdominal: Soft. Normal BS. No distention. She exhibits no mass. There is very mild generalized tenderness. There is no reboundand no guarding.  Musculoskeletal: Normal range of motion. She exhibits no edemaor deformity.  Neurological: She is alert and oriented  Skin: Skin is warmand dry. No rashnoted. She is not diaphoretic.  Psychiatric: Moodand affectnormal.  Nursing noteand vitalsreviewed.  Lab Results:   Recent Labs  08/22/16 0527 08/23/16 0554  WBC 9.4 7.3  HGB 8.3* 8.6*  HCT 25.5* 26.9*  PLT 561* 582*   BMET  Recent Labs  08/22/16 0527 08/23/16 0554  NA 138 136  K 4.1 3.5  CL 104 101  CO2 25 26  GLUCOSE 92 86  BUN 15 8  CREATININE 0.72 0.65  CALCIUM 8.7* 8.9   PT/INR  Recent Labs  08/21/16 1732  LABPROT 13.9  INR 1.07   CMP     Component Value Date/Time   NA 136 08/23/2016 0554   K 3.5 08/23/2016 0554   CL 101 08/23/2016 0554   CO2 26 08/23/2016 0554   GLUCOSE 86 08/23/2016 0554    BUN 8 08/23/2016 0554   CREATININE 0.65 08/23/2016 0554   CALCIUM 8.9 08/23/2016 0554   PROT 5.6 (L) 08/23/2016 0554   ALBUMIN 2.3 (L) 08/23/2016 0554   AST 20 08/23/2016 0554   ALT 11 (L) 08/23/2016 0554   ALKPHOS 64 08/23/2016 0554   BILITOT 0.8 08/23/2016 0554   GFRNONAA >60 08/23/2016 0554   GFRAA >60 08/23/2016 0554   Lipase     Component Value Date/Time   LIPASE 22 08/21/2016 1005       Studies/Results: Ct Abdomen Pelvis W Contrast  Result Date: 08/21/2016 CLINICAL DATA:  Abdominal pain and distention.  Constipation. EXAM: CT ABDOMEN AND PELVIS WITH CONTRAST TECHNIQUE: Multidetector CT imaging of the abdomen and pelvis was performed using the standard protocol following bolus administration of intravenous contrast. CONTRAST:  187mL ISOVUE-300 IOPAMIDOL (ISOVUE-300) INJECTION 61% COMPARISON:  07/16/2016 FINDINGS: Lower chest: Mild atelectasis in both lower lobes. No pleural or pericardial fluid. Small hiatal hernia. Hepatobiliary: No liver parenchymal abnormality is seen. There is intra and extrahepatic biliary ductal dilatation. There are multiple stones in the gallbladder. There appears to be a filling defect in the distal common duct, non calcified, most consistent with a passing stone. Pancreas: Normal Spleen: Normal Adrenals/Urinary Tract: Normal appearance of the kidneys except for vascular calcification. Adrenal glands are normal. Stomach/Bowel: Markedly dilated fluid filled loops of small intestine consistent with small bowel obstruction. The colon is essentially collapsed. Few loops of collapsed ileum are noted. Vascular/Lymphatic: Aortic atherosclerosis. No  aneurysm. IVC is normal. Reproductive: Previous hysterectomy.  No pelvic mass. Other: Small amount of free fluid.  No free air. Musculoskeletal: Curvature in chronic degenerative change of the spine. Superior endplate fracture of L2 is chronic. IMPRESSION: High-grade small bowel obstruction. Small amount of free fluid. No  free air. Chololithiasis. Intra and extra hepatic biliary ductal dilatation. Probable choledocholithiasis with a stone or stones in the distal duct. Electronically Signed   By: Nelson Chimes M.D.   On: 08/21/2016 13:59   Dg Chest Port 1 View  Result Date: 08/21/2016 CLINICAL DATA:  Preoperative respiratory evaluation. EXAM: PORTABLE CHEST 1 VIEW COMPARISON:  08/21/2016, earlier the same day. FINDINGS: The lungs are clear wiithout focal pneumonia, edema, pneumothorax or pleural effusion. The cardiopericardial silhouette is within normal limits for size. NG tube tip is in the proximal stomach with proximal port of the NG tube in the distal esophagus. Bones are diffusely demineralized. IMPRESSION: 1. No acute cardiopulmonary findings. 2. The tip of the NG tube is just barely into the stomach with proximal port in the esophagus. Electronically Signed   By: Misty Stanley M.D.   On: 08/21/2016 18:05   Dg Abdomen Acute W/chest  Result Date: 08/21/2016 CLINICAL DATA:  Pain and abdominal distention for 2 days with nausea. EXAM: DG ABDOMEN ACUTE W/ 1V CHEST COMPARISON:  Chest radiograph 07/19/2016. FINDINGS: Frontal view of the chest shows midline trachea and normal heart size. Thoracic aorta is calcified. Biapical pleuroparenchymal scarring. No airspace consolidation or pleural fluid. There is marked gaseous distention of colon with a fair amount of stool and air-fluid levels. Gas is seen in the rectosigmoid colon. There may be bowel gas extending into the right inguinal canal. No definite small bowel dilatation. Dextroconvex scoliosis, centered near the thoracolumbar junction, with degenerative changes. IMPRESSION: 1. Bowel gas pattern is indicative of a colonic ileus. Distal colorectal obstruction cannot be excluded. 2. There may be bowel gas extending into the right inguinal canal. Difficult to exclude a hernia. 3. CT abdomen pelvis with contrast may be helpful in further evaluation, as clinically indicated.  Electronically Signed   By: Lorin Picket M.D.   On: 08/21/2016 13:34   Dg Abd Portable 1v-small Bowel Obstruction Protocol-initial, 8 Hr Delay  Result Date: 08/22/2016 CLINICAL DATA:  Small bowel obstruction.  8 hour delayed film. EXAM: PORTABLE ABDOMEN - 1 VIEW COMPARISON:  08/21/2016 FINDINGS: Enteric tube tip is in the left upper quadrant consistent with location in the stomach. The tube is kinked at the proximal side hole. Positioning is not significantly changed since prior study. Contrast material is demonstrated throughout the colon and into the rectum. Presence of contrast material in the colon and mild gaseous distention of small bowel suggests partial obstruction or ileus. IMPRESSION: Contrast material demonstrated throughout the colon. Electronically Signed   By: Lucienne Capers M.D.   On: 08/22/2016 06:24   Dg Abd Portable 1v  Result Date: 08/21/2016 CLINICAL DATA:  Nasogastric tube placement. EXAM: PORTABLE ABDOMEN - 1 VIEW COMPARISON:  Radiographs of same day. FINDINGS: Distal tip of nasogastric tube is seen in proximal stomach. Dilated small bowel loops are again noted. IMPRESSION: Distal tip of nasogastric tube is seen in expected position of proximal stomach. Dilated small bowel loops are noted concerning for distal small bowel obstruction. Electronically Signed   By: Marijo Conception, M.D.   On: 08/21/2016 20:28   Dg Abd Portable 1 View  Result Date: 08/21/2016 CLINICAL DATA:  71 year old female enteric tube placement. Initial encounter. EXAM: PORTABLE  ABDOMEN - 1 VIEW COMPARISON:  CT Abdomen and Pelvis 1342 hours today. FINDINGS: Portable AP supine view at 1646 hours. Enteric tube courses to the stomach. The side hole is in the distal thoracic esophagus. Negative lung bases. Small volume of excreted IV contrast in the renal collecting systems stable bowel gas pattern. IMPRESSION: 1. Enteric tube side hole in the distal thoracic esophagus. Advance 8 cm to ensure side hole placement  within the stomach. 2. Stable bowel gas pattern from the CT Abdomen and Pelvis today. Electronically Signed   By: Genevie Ann M.D.   On: 08/21/2016 16:57    Anti-infectives: Anti-infectives    None     Assessment/Plan  Possible small bowel obstruction - Patient hospitalized in early January 2018 for small bowel obstruction - abd film 2/8 demonstrated Contrast material in the colon, patient not fully obstructed - Patient tolerated NG tube clamping and sips and NGT dc'd. - Clear liquid diet - abdominal pain and distention improved. Pt having BM's   Choledocholithiasis - GI planning possible ERCP tomorrow - Labs not concerning for obstructed CBG, normal LFTs and T bili  Plan: Patient does not appear to be obstructed at this time. Pain improved, having BM's. Unsure if gallstones were the etiology for her partial small bowel obstruction. GI planning ERCP tomorrow. Clears. Nothing by mouth at midnight. Will determine if patient needs possible cholecystectomy this admission pending ERCP. We will continue to follow.    LOS: 2 days    Kalman Drape , [facility] Surgery 08/23/2016, 8:48 AM Pager: [phone] Consults: [phone] Mon-Fri 7:00 am-4:30 pm Sat-Sun 7:00 am-11:30 am

## 2016-08-23 NOTE — Progress Notes (Signed)
[facility] Progress Note    Since last GI note: NG tube removed last night. No vomiting or nausea.  She has been passing gas and stool.  No abd pains.  Has tolerates sips of water.  Objective: Vital signs in last 24 hours: Temp:  [97.3 F (36.3 C)-98.3 F (36.8 C)] 98 F (36.7 C) (02/09 0520) Pulse Rate:  [63-71] 71 (02/09 0520) Resp:  [17-20] 18 (02/09 0520) BP: (85-124)/(53-78) 109/59 (02/09 0542) SpO2:  [97 %-99 %] 99 % (02/09 0520) Last BM Date: 08/22/16 General: alert and oriented times 3 Heart: regular rate and rythm Abdomen: soft, non-tender, mildly distended, normal bowel sounds   Lab Results:  Recent Labs  08/21/16 1005 08/22/16 0527 08/23/16 0554  WBC 15.0* 9.4 7.3  HGB 11.0* 8.3* 8.6*  PLT 702* 561* 582*  MCV 95.8 97.0 98.5    Recent Labs  08/21/16 1005 08/22/16 0527 08/23/16 0554  NA 133* 138 136  K 4.7 4.1 3.5  CL 95* 104 101  CO2 27 25 26   GLUCOSE 118* 92 86  BUN 13 15 8   CREATININE 0.77 0.72 0.65  CALCIUM 10.4* 8.7* 8.9    Recent Labs  08/21/16 1005 08/22/16 0527 08/23/16 0554  PROT 7.5 5.6* 5.6*  ALBUMIN 3.1* 2.2* 2.3*  AST 26 17 20   ALT 19 14 11*  ALKPHOS 97 69 64  BILITOT 0.4 0.2* 0.8    Recent Labs  08/21/16 1732  INR 1.07    Medications: Scheduled Meds: . enoxaparin (LOVENOX) injection  40 mg Subcutaneous Q24H  . famotidine (PEPCID) IV  20 mg Intravenous Q24H   Continuous Infusions: . dextrose 5 % and 0.9% NaCl 50 mL/hr at 08/21/16 1852   PRN Meds:.acetaminophen **OR** acetaminophen, morphine injection, ondansetron **OR** ondansetron (ZOFRAN) IV   Assessment/Plan: 71 y.o. female with resolved SBO/PSBO; also gallstone disease with stones in gallbladder and also "probable CBD stones" on CT as well  Has had normal LFTs so it is not clear if her bowel obstruction was related to the gallstone disease. 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  We discussed ERCP and she would like to proceed with that during this admission. Discussed risks of pancreatitis, perforation, bleeding, infection. Will look at tomorrow morning for ERCP, I've advanced her diet to clear liquids for today, will make her NPO after midnight, and will hold the lovenox dose later today as well.    Milus Banister, MD  08/23/2016, 8:11 AM [facility] Pager [phone]

## 2016-08-23 NOTE — Anesthesia Preprocedure Evaluation (Addendum)
Anesthesia Evaluation  Patient identified by MRN, date of birth, ID band Patient awake    Reviewed: Allergy & Precautions, NPO status , Patient's Chart, lab work & pertinent test results  History of Anesthesia Complications (+) PONV  Airway Mallampati: II  TM Distance: >3 FB Neck ROM: Full    Dental  (+) Dental Advisory Given, Missing, Chipped, Poor Dentition   Pulmonary neg pulmonary ROS, former smoker,    Pulmonary exam normal breath sounds clear to auscultation       Cardiovascular negative cardio ROS Normal cardiovascular exam Rhythm:Regular Rate:Normal     Neuro/Psych negative neurological ROS  negative psych ROS   GI/Hepatic Neg liver ROS, hiatal hernia, GERD  Medicated and Controlled,  Endo/Other  negative endocrine ROS  Renal/GU negative Renal ROS  negative genitourinary   Musculoskeletal negative musculoskeletal ROS (+) Arthritis , Osteoarthritis,    Abdominal   Peds negative pediatric ROS (+)  Hematology  (+) anemia ,   Anesthesia Other Findings   Reproductive/Obstetrics negative OB ROS                           Anesthesia Physical Anesthesia Plan  ASA: III  Anesthesia Plan: General   Post-op Pain Management:    Induction: Intravenous  Airway Management Planned: Oral ETT  Additional Equipment:   Intra-op Plan:   Post-operative Plan: Extubation in OR  Informed Consent: I have reviewed the patients History and Physical, chart, labs and discussed the procedure including the risks, benefits and alternatives for the proposed anesthesia with the patient or authorized representative who has indicated his/her understanding and acceptance.   Dental advisory given  Plan Discussed with: CRNA, Surgeon and Anesthesiologist  Anesthesia Plan Comments:        Anesthesia Quick Evaluation

## 2016-08-24 ENCOUNTER — Inpatient Hospital Stay (HOSPITAL_COMMUNITY): Payer: Medicare Other | Admitting: Anesthesiology

## 2016-08-24 ENCOUNTER — Encounter (HOSPITAL_COMMUNITY): Payer: Self-pay | Admitting: *Deleted

## 2016-08-24 ENCOUNTER — Encounter (HOSPITAL_COMMUNITY)
Admission: EM | Disposition: A | Discharge status: Home Health | Payer: Self-pay | Source: Home / Self Care | Attending: Internal Medicine

## 2016-08-24 ENCOUNTER — Inpatient Hospital Stay (HOSPITAL_COMMUNITY): Payer: Medicare Other

## 2016-08-24 DIAGNOSIS — R739 Hyperglycemia, unspecified: Secondary | ICD-10-CM

## 2016-08-24 HISTORY — PX: ERCP: SHX5425

## 2016-08-24 LAB — GLUCOSE, CAPILLARY
GLUCOSE-CAPILLARY: 121 mg/dL — AB (ref 65–99)
GLUCOSE-CAPILLARY: 125 mg/dL — AB (ref 65–99)

## 2016-08-24 SURGERY — ERCP, WITH INTERVENTION IF INDICATED
Anesthesia: General

## 2016-08-24 MED ORDER — FENTANYL CITRATE (PF) 100 MCG/2ML IJ SOLN
INTRAMUSCULAR | Status: DC | PRN
  Administered 2016-08-24: 50 ug via INTRAVENOUS

## 2016-08-24 MED ORDER — ONDANSETRON HCL 4 MG/2ML IJ SOLN
INTRAMUSCULAR | Status: DC | PRN
  Administered 2016-08-24: 4 mg via INTRAVENOUS

## 2016-08-24 MED ORDER — PHENYLEPHRINE HCL 10 MG/ML IJ SOLN
INTRAVENOUS | Status: DC | PRN
  Administered 2016-08-24: 40 ug/min via INTRAVENOUS

## 2016-08-24 MED ORDER — IOPAMIDOL (ISOVUE-300) INJECTION 61%
INTRAVENOUS | Status: AC
  Filled 2016-08-24: qty 50

## 2016-08-24 MED ORDER — INDOMETHACIN 50 MG RE SUPP
RECTAL | Status: AC
  Filled 2016-08-24: qty 2

## 2016-08-24 MED ORDER — HYDROMORPHONE HCL 1 MG/ML IJ SOLN: 0.2500 mg | INTRAMUSCULAR | Status: DC | PRN

## 2016-08-24 MED ORDER — SUGAMMADEX SODIUM 200 MG/2ML IV SOLN
INTRAVENOUS | Status: DC | PRN
  Administered 2016-08-24: 200 mg via INTRAVENOUS

## 2016-08-24 MED ORDER — ALBUTEROL SULFATE (2.5 MG/3ML) 0.083% IN NEBU: 2.5000 mg | INHALATION_SOLUTION | RESPIRATORY_TRACT | Status: DC | PRN

## 2016-08-24 MED ORDER — MIDAZOLAM HCL 5 MG/5ML IJ SOLN
INTRAMUSCULAR | Status: DC | PRN
Start: 2016-08-24 — End: 2016-08-24
  Administered 2016-08-24: 1 mg via INTRAVENOUS

## 2016-08-24 MED ORDER — PROPOFOL 10 MG/ML IV BOLUS
INTRAVENOUS | Status: DC | PRN
Start: 2016-08-24 — End: 2016-08-24
  Administered 2016-08-24: 80 mg via INTRAVENOUS

## 2016-08-24 MED ORDER — ROCURONIUM BROMIDE 100 MG/10ML IV SOLN
INTRAVENOUS | Status: DC | PRN
Start: 2016-08-24 — End: 2016-08-24
  Administered 2016-08-24: 30 mg via INTRAVENOUS

## 2016-08-24 MED ORDER — INDOMETHACIN 50 MG RE SUPP
RECTAL | Status: DC | PRN
Start: 2016-08-24 — End: 2016-08-24
  Administered 2016-08-24: 100 mg via RECTAL

## 2016-08-24 MED ORDER — CIPROFLOXACIN IN D5W 400 MG/200ML IV SOLN
INTRAVENOUS | Status: AC
  Filled 2016-08-24: qty 200

## 2016-08-24 MED ORDER — SODIUM CHLORIDE 0.9 % IV SOLN
INTRAVENOUS | Status: DC | PRN
  Administered 2016-08-24: 30 mL

## 2016-08-24 MED ORDER — CIPROFLOXACIN IN D5W 400 MG/200ML IV SOLN
INTRAVENOUS | Status: DC | PRN
  Administered 2016-08-24: 400 mg via INTRAVENOUS

## 2016-08-24 MED ORDER — PROMETHAZINE HCL 25 MG/ML IJ SOLN: 6.2500 mg | INTRAMUSCULAR | Status: DC | PRN

## 2016-08-24 MED ORDER — LIDOCAINE HCL (CARDIAC) 20 MG/ML IV SOLN
INTRAVENOUS | Status: DC | PRN
  Administered 2016-08-24: 60 mg via INTRAVENOUS

## 2016-08-24 NOTE — Anesthesia Procedure Notes (Signed)
Procedure Name: Intubation Date/Time: 08/24/2016 8:29 AM Performed by: Carney Living Pre-anesthesia Checklist: Patient identified, Emergency Drugs available, Timeout performed, Suction available and Patient being monitored Patient Re-evaluated:Patient Re-evaluated prior to inductionOxygen Delivery Method: Circle system utilized Preoxygenation: Pre-oxygenation with 100% oxygen Intubation Type: IV induction Ventilation: Mask ventilation without difficulty Laryngoscope Size: Mac and 4 Grade View: Grade IV Tube type: Oral Tube size: 7.0 mm Number of attempts: 1 Airway Equipment and Method: Stylet Placement Confirmation: positive ETCO2 and breath sounds checked- equal and bilateral Secured at: 22 cm Tube secured with: Tape Dental Injury: Teeth and Oropharynx as per pre-operative assessment  Difficulty Due To: Difficulty was unanticipated, Difficult Airway- due to anterior larynx and Difficult Airway- due to limited oral opening Comments: Unable to visualize glottis or arytenoids, ETT passed blindly above epiglottis, recommend glide scope

## 2016-08-24 NOTE — Progress Notes (Signed)
Day of Surgery  Subjective: S/p ERCP Doing well  Objective: Vital signs in last 24 hours: Temp:  [97.5 F (36.4 C)-99.6 F (37.6 C)] 97.6 F (36.4 C) (02/10 1004) Pulse Rate:  [56-77] 64 (02/10 1004) Resp:  [11-18] 18 (02/10 1004) BP: (106-132)/(66-86) 121/66 (02/10 1004) SpO2:  [96 %-100 %] 100 % (02/10 1004) Weight:  [49.9 kg (110 lb)] 49.9 kg (110 lb) (02/10 0718) Last BM Date: 08/22/16  Intake/Output from previous day: 02/09 0701 - 02/10 0700 In: 1580 [P.O.:1580] Out: 600 [Urine:600] Intake/Output this shift: Total I/O In: 920 [P.O.:420; I.V.:500] Out: 0   General appearance: alert and cooperative GI: soft, non-tender; bowel sounds normal; no masses,  no organomegaly  Lab Results:   Recent Labs  08/22/16 0527 08/23/16 0554  WBC 9.4 7.3  HGB 8.3* 8.6*  HCT 25.5* 26.9*  PLT 561* 582*   BMET  Recent Labs  08/22/16 0527 08/23/16 0554  NA 138 136  K 4.1 3.5  CL 104 101  CO2 25 26  GLUCOSE 92 86  BUN 15 8  CREATININE 0.72 0.65  CALCIUM 8.7* 8.9   PT/INR  Recent Labs  08/21/16 1732  LABPROT 13.9  INR 1.07   ABG No results for input(s): PHART, HCO3 in the last 72 hours.  Invalid input(s): PCO2, PO2  Studies/Results: Dg Ercp Biliary & Pancreatic Ducts  Result Date: 08/24/2016 CLINICAL DATA:  Cholelithiasis with biliary ductal dilatation, choledocholithiasis suspected on CT EXAM: ERCP TECHNIQUE: Multiple spot images obtained with the fluoroscopic device and submitted for interpretation post-procedure. FLUOROSCOPY TIME:  1 minutes 31 second COMPARISON:  CT 08/21/2016 FINDINGS: Two spot images document endoscopic cannulation and opacification of the CBD with advancement of a catheter into the proximal CBD. There is incomplete opacification of the intrahepatic biliary tree which appears mildly distended centrally. Cystic duct is patent. Partial opacification of the gallbladder lumen demonstrating multiple filling defects . Possible filling defects in the  CBD on the second image. IMPRESSION: 1. Cholelithiasis and possible choledocholithiasis, with endoscopic intervention. These images were submitted for radiologic interpretation only. Please see the procedural report for the amount of contrast and the fluoroscopy time utilized. Electronically Signed   By: Lucrezia Europe M.D.   On: 08/24/2016 09:27    Anti-infectives: Anti-infectives    None      Assessment/Plan: SBO resolved Choledocholithiasis-s/p ERCP.  Will need Lap chole on this hospital stay.  Trending labs  LOS: 3 days    Rosario Jacks., Anne Hahn 08/24/2016

## 2016-08-24 NOTE — H&P (View-Only) (Signed)
[facility] Progress Note    Since last GI note: NG tube removed last night. No vomiting or nausea.  She has been passing gas and stool.  No abd pains.  Has tolerates sips of water.  Objective: Vital signs in last 24 hours: Temp:  [97.3 F (36.3 C)-98.3 F (36.8 C)] 98 F (36.7 C) (02/09 0520) Pulse Rate:  [63-71] 71 (02/09 0520) Resp:  [17-20] 18 (02/09 0520) BP: (85-124)/(53-78) 109/59 (02/09 0542) SpO2:  [97 %-99 %] 99 % (02/09 0520) Last BM Date: 08/22/16 General: alert and oriented times 3 Heart: regular rate and rythm Abdomen: soft, non-tender, mildly distended, normal bowel sounds   Lab Results:  Recent Labs  08/21/16 1005 08/22/16 0527 08/23/16 0554  WBC 15.0* 9.4 7.3  HGB 11.0* 8.3* 8.6*  PLT 702* 561* 582*  MCV 95.8 97.0 98.5    Recent Labs  08/21/16 1005 08/22/16 0527 08/23/16 0554  NA 133* 138 136  K 4.7 4.1 3.5  CL 95* 104 101  CO2 27 25 26   GLUCOSE 118* 92 86  BUN 13 15 8   CREATININE 0.77 0.72 0.65  CALCIUM 10.4* 8.7* 8.9    Recent Labs  08/21/16 1005 08/22/16 0527 08/23/16 0554  PROT 7.5 5.6* 5.6*  ALBUMIN 3.1* 2.2* 2.3*  AST 26 17 20   ALT 19 14 11*  ALKPHOS 97 69 64  BILITOT 0.4 0.2* 0.8    Recent Labs  08/21/16 1732  INR 1.07    Medications: Scheduled Meds: . enoxaparin (LOVENOX) injection  40 mg Subcutaneous Q24H  . famotidine (PEPCID) IV  20 mg Intravenous Q24H   Continuous Infusions: . dextrose 5 % and 0.9% NaCl 50 mL/hr at 08/21/16 1852   PRN Meds:.acetaminophen **OR** acetaminophen, morphine injection, ondansetron **OR** ondansetron (ZOFRAN) IV   Assessment/Plan: 71 y.o. female with resolved SBO/PSBO; also gallstone disease with stones in gallbladder and also "probable CBD stones" on CT as well  Has had normal LFTs so it is not clear if her bowel obstruction was related to the gallstone disease. 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  We discussed ERCP and she would like to proceed with that during this admission. Discussed risks of pancreatitis, perforation, bleeding, infection. Will look at tomorrow morning for ERCP, I've advanced her diet to clear liquids for today, will make her NPO after midnight, and will hold the lovenox dose later today as well.    Milus Banister, MD  08/23/2016, 8:11 AM [facility] Pager [phone]

## 2016-08-24 NOTE — Progress Notes (Signed)
PROGRESS NOTE        PATIENT DETAILS Name: Ashley Howard Age: 71 y.o. Sex: female Date of Birth: [birth year] Admit Date: 08/21/2016 Admitting Physician Waldemar Dickens, MD [id number] [name], MD  Brief Narrative: Patient is a 71 y.o. female with prior history of SBO (most recently in January 2018), dyslipidemia, GERD admitted on 2/7 with abdominal pain. Subsequently found to have a small bowel obstruction and a choledocholithiasis. See below for further details.  Subjective: Just came back from ERCP-denies any abdominal pain. Had numerous bowel movements last night.  Assessment/Plan: Small bowel obstruction: Seems to have resolved-has had multiple bowel movements this morning. NG tube has been removed. Abdomen remained soft and nontender. Diet being advanced. Gen. surgery continues to follow.   Choledocholithiasis: Underwent ERCP on 2/10-awaiting general surgery opinion regarding timing of cholecystectomy. LFTs are within normal limits.  GERD: Continue Pepcid.  Anemia: Suspect hemoglobin of 11.0 on admission was due to hemoconcentration, recent hemoglobin appears to be around 9. Slight drop in hemoglobin likely due to IV fluid dilution. Hemoglobin appears stable-follow  Urinary incontinence: s/p bladder surgery, continue ditropan   HLD: continue statin   CHronic Neuropathic pain: resume neurontin  Protein-calorie malnutrition, severe: Start supplements   DVT Prophylaxis: SCD's in anticipation of ERCP/cholecystectomy  Code Status: Full code   Family Communication: None at bedside  Disposition Plan: Remain inpatient-back to SNF sometime early next week  Antimicrobial agents: Anti-infectives    None     Procedures: None  CONSULTS:  GI and general surgery  Time spent: 25- minutes-Greater than 50% of this time was spent in counseling, explanation of diagnosis, planning of further management, and coordination of  care.  MEDICATIONS: Scheduled Meds: . famotidine  20 mg Oral Daily  . feeding supplement (ENSURE ENLIVE)  237 mL Oral TID BM  . gabapentin  300 mg Oral TID  . oxybutynin  5 mg Oral BID  . pravastatin  20 mg Oral q1800   Continuous Infusions:  PRN Meds:.acetaminophen **OR** acetaminophen, morphine injection, ondansetron **OR** ondansetron (ZOFRAN) IV, simethicone   PHYSICAL EXAM: Vital signs: Vitals:   08/24/16 0929 08/24/16 0944 08/24/16 0945 08/24/16 1004  BP: 127/86 106/76  121/66  Pulse: 61 76  64  Resp: 12 17  18   Temp:   97.7 F (36.5 C) 97.6 F (36.4 C)  TempSrc:    Oral  SpO2: 100% 100%  100%  Weight:      Height:       Filed Weights   08/22/16 0700 08/24/16 0718  Weight: 50 kg (110 lb 3.7 oz) 49.9 kg (110 lb)   Body mass index is 22.22 kg/m.   General appearance :Awake, alert, not in any distress. Speech Clear.  Eyes:, pupils equally reactive to light and accomodation,no scleral icterus. HEENT: Atraumatic and Normocephalic Neck: supple, no JVD.  Resp:Good air entry bilaterally, no added sounds  CVS: S1 S2 regular, no murmurs.  GI: Bowel sounds +, Non tender and not distended with no gaurding, rigidity or rebound.No organomegaly Extremities: B/L Lower Ext shows no edema, both legs are warm to touch Neurology:  speech clear,Non focal, sensation is grossly intact. Psychiatric: Normal judgment and insight. Alert and oriented x 3. Normal mood. Musculoskeletal:No digital cyanosis Skin:No Rash, warm and dry Wounds:N/A  I have personally reviewed following labs and imaging studies  LABORATORY DATA: CBC:  Recent Labs  Lab 08/21/16 1005 08/22/16 0527 08/23/16 0554  WBC 15.0* 9.4 7.3  HGB 11.0* 8.3* 8.6*  HCT 33.8* 25.5* 26.9*  MCV 95.8 97.0 98.5  PLT 702* 561* 582*    Basic Metabolic Panel:  Recent Labs Lab 08/21/16 1005 08/22/16 0527 08/23/16 0554  NA 133* 138 136  K 4.7 4.1 3.5  CL 95* 104 101  CO2 27 25 26   GLUCOSE 118* 92 86  BUN 13 15  8   CREATININE 0.77 0.72 0.65  CALCIUM 10.4* 8.7* 8.9    GFR: Estimated Creatinine Clearance: 43.4 mL/min (by C-G formula based on SCr of 0.65 mg/dL).  Liver Function Tests:  Recent Labs Lab 08/21/16 1005 08/22/16 0527 08/23/16 0554  AST 26 17 20   ALT 19 14 11*  ALKPHOS 97 69 64  BILITOT 0.4 0.2* 0.8  PROT 7.5 5.6* 5.6*  ALBUMIN 3.1* 2.2* 2.3*    Recent Labs Lab 08/21/16 1005  LIPASE 22   No results for input(s): AMMONIA in the last 168 hours.  Coagulation Profile:  Recent Labs Lab 08/21/16 1732  INR 1.07    Cardiac Enzymes: No results for input(s): CKTOTAL, CKMB, CKMBINDEX, TROPONINI in the last 168 hours.  BNP (last 3 results) No results for input(s): PROBNP in the last 8760 hours.  HbA1C: No results for input(s): HGBA1C in the last 72 hours.  CBG:  Recent Labs Lab 08/22/16 0226 08/22/16 0817 08/23/16 0019 08/23/16 1138 08/24/16 0005  GLUCAP 89 83 86 171* 121*    Lipid Profile: No results for input(s): CHOL, HDL, LDLCALC, TRIG, CHOLHDL, LDLDIRECT in the last 72 hours.  Thyroid Function Tests: No results for input(s): TSH, T4TOTAL, FREET4, T3FREE, THYROIDAB in the last 72 hours.  Anemia Panel: No results for input(s): VITAMINB12, FOLATE, FERRITIN, TIBC, IRON, RETICCTPCT in the last 72 hours.  Urine analysis:    Component Value Date/Time   COLORURINE YELLOW 08/21/2016 1650   APPEARANCEUR CLOUDY (A) 08/21/2016 1650   LABSPEC >1.046 (H) 08/21/2016 1650   PHURINE 6.0 08/21/2016 1650   GLUCOSEU NEGATIVE 08/21/2016 1650   HGBUR NEGATIVE 08/21/2016 1650   BILIRUBINUR NEGATIVE 08/21/2016 1650   KETONESUR NEGATIVE 08/21/2016 1650   PROTEINUR NEGATIVE 08/21/2016 1650   UROBILINOGEN 0.2 06/27/2016 1216   NITRITE NEGATIVE 08/21/2016 1650   LEUKOCYTESUR NEGATIVE 08/21/2016 1650    Sepsis Labs: Lactic Acid, Venous    Component Value Date/Time   LATICACIDVEN 0.58 08/21/2016 1604    MICROBIOLOGY: No results found for this or any previous  visit (from the past 240 hour(s)).  RADIOLOGY STUDIES/RESULTS: Ct Abdomen Pelvis W Contrast  Result Date: 08/21/2016 CLINICAL DATA:  Abdominal pain and distention.  Constipation. EXAM: CT ABDOMEN AND PELVIS WITH CONTRAST TECHNIQUE: Multidetector CT imaging of the abdomen and pelvis was performed using the standard protocol following bolus administration of intravenous contrast. CONTRAST:  134mL ISOVUE-300 IOPAMIDOL (ISOVUE-300) INJECTION 61% COMPARISON:  07/16/2016 FINDINGS: Lower chest: Mild atelectasis in both lower lobes. No pleural or pericardial fluid. Small hiatal hernia. Hepatobiliary: No liver parenchymal abnormality is seen. There is intra and extrahepatic biliary ductal dilatation. There are multiple stones in the gallbladder. There appears to be a filling defect in the distal common duct, non calcified, most consistent with a passing stone. Pancreas: Normal Spleen: Normal Adrenals/Urinary Tract: Normal appearance of the kidneys except for vascular calcification. Adrenal glands are normal. Stomach/Bowel: Markedly dilated fluid filled loops of small intestine consistent with small bowel obstruction. The colon is essentially collapsed. Few loops of collapsed ileum are noted. Vascular/Lymphatic: Aortic atherosclerosis. No  aneurysm. IVC is normal. Reproductive: Previous hysterectomy.  No pelvic mass. Other: Small amount of free fluid.  No free air. Musculoskeletal: Curvature in chronic degenerative change of the spine. Superior endplate fracture of L2 is chronic. IMPRESSION: High-grade small bowel obstruction. Small amount of free fluid. No free air. Chololithiasis. Intra and extra hepatic biliary ductal dilatation. Probable choledocholithiasis with a stone or stones in the distal duct. Electronically Signed   By: Nelson Chimes M.D.   On: 08/21/2016 13:59   Dg Chest Port 1 View  Result Date: 08/21/2016 CLINICAL DATA:  Preoperative respiratory evaluation. EXAM: PORTABLE CHEST 1 VIEW COMPARISON:   08/21/2016, earlier the same day. FINDINGS: The lungs are clear wiithout focal pneumonia, edema, pneumothorax or pleural effusion. The cardiopericardial silhouette is within normal limits for size. NG tube tip is in the proximal stomach with proximal port of the NG tube in the distal esophagus. Bones are diffusely demineralized. IMPRESSION: 1. No acute cardiopulmonary findings. 2. The tip of the NG tube is just barely into the stomach with proximal port in the esophagus. Electronically Signed   By: Misty Stanley M.D.   On: 08/21/2016 18:05   Dg Ercp Biliary & Pancreatic Ducts  Result Date: 08/24/2016 CLINICAL DATA:  Cholelithiasis with biliary ductal dilatation, choledocholithiasis suspected on CT EXAM: ERCP TECHNIQUE: Multiple spot images obtained with the fluoroscopic device and submitted for interpretation post-procedure. FLUOROSCOPY TIME:  1 minutes 31 second COMPARISON:  CT 08/21/2016 FINDINGS: Two spot images document endoscopic cannulation and opacification of the CBD with advancement of a catheter into the proximal CBD. There is incomplete opacification of the intrahepatic biliary tree which appears mildly distended centrally. Cystic duct is patent. Partial opacification of the gallbladder lumen demonstrating multiple filling defects . Possible filling defects in the CBD on the second image. IMPRESSION: 1. Cholelithiasis and possible choledocholithiasis, with endoscopic intervention. These images were submitted for radiologic interpretation only. Please see the procedural report for the amount of contrast and the fluoroscopy time utilized. Electronically Signed   By: Lucrezia Europe M.D.   On: 08/24/2016 09:27   Dg Abdomen Acute W/chest  Result Date: 08/21/2016 CLINICAL DATA:  Pain and abdominal distention for 2 days with nausea. EXAM: DG ABDOMEN ACUTE W/ 1V CHEST COMPARISON:  Chest radiograph 07/19/2016. FINDINGS: Frontal view of the chest shows midline trachea and normal heart size. Thoracic aorta is  calcified. Biapical pleuroparenchymal scarring. No airspace consolidation or pleural fluid. There is marked gaseous distention of colon with a fair amount of stool and air-fluid levels. Gas is seen in the rectosigmoid colon. There may be bowel gas extending into the right inguinal canal. No definite small bowel dilatation. Dextroconvex scoliosis, centered near the thoracolumbar junction, with degenerative changes. IMPRESSION: 1. Bowel gas pattern is indicative of a colonic ileus. Distal colorectal obstruction cannot be excluded. 2. There may be bowel gas extending into the right inguinal canal. Difficult to exclude a hernia. 3. CT abdomen pelvis with contrast may be helpful in further evaluation, as clinically indicated. Electronically Signed   By: Lorin Picket M.D.   On: 08/21/2016 13:34   Dg Abd Portable 1v-small Bowel Obstruction Protocol-initial, 8 Hr Delay  Result Date: 08/22/2016 CLINICAL DATA:  Small bowel obstruction.  8 hour delayed film. EXAM: PORTABLE ABDOMEN - 1 VIEW COMPARISON:  08/21/2016 FINDINGS: Enteric tube tip is in the left upper quadrant consistent with location in the stomach. The tube is kinked at the proximal side hole. Positioning is not significantly changed since prior study. Contrast material is demonstrated  throughout the colon and into the rectum. Presence of contrast material in the colon and mild gaseous distention of small bowel suggests partial obstruction or ileus. IMPRESSION: Contrast material demonstrated throughout the colon. Electronically Signed   By: Lucienne Capers M.D.   On: 08/22/2016 06:24   Dg Abd Portable 1v  Result Date: 08/21/2016 CLINICAL DATA:  Nasogastric tube placement. EXAM: PORTABLE ABDOMEN - 1 VIEW COMPARISON:  Radiographs of same day. FINDINGS: Distal tip of nasogastric tube is seen in proximal stomach. Dilated small bowel loops are again noted. IMPRESSION: Distal tip of nasogastric tube is seen in expected position of proximal stomach. Dilated small  bowel loops are noted concerning for distal small bowel obstruction. Electronically Signed   By: Marijo Conception, M.D.   On: 08/21/2016 20:28   Dg Abd Portable 1 View  Result Date: 08/21/2016 CLINICAL DATA:  71 year old female enteric tube placement. Initial encounter. EXAM: PORTABLE ABDOMEN - 1 VIEW COMPARISON:  CT Abdomen and Pelvis 1342 hours today. FINDINGS: Portable AP supine view at 1646 hours. Enteric tube courses to the stomach. The side hole is in the distal thoracic esophagus. Negative lung bases. Small volume of excreted IV contrast in the renal collecting systems stable bowel gas pattern. IMPRESSION: 1. Enteric tube side hole in the distal thoracic esophagus. Advance 8 cm to ensure side hole placement within the stomach. 2. Stable bowel gas pattern from the CT Abdomen and Pelvis today. Electronically Signed   By: Genevie Ann M.D.   On: 08/21/2016 16:57     LOS: 3 days   Oren Binet, MD  [facility] Pager:[phone]  If 7PM-7AM, please contact night-coverage [email or web address] Password [email or web address] 08/24/2016, 11:07 AM

## 2016-08-24 NOTE — Transfer of Care (Signed)
Immediate Anesthesia Transfer of Care Note  Patient: Ashley Howard  Procedure(s) Performed: Procedure(s): ENDOSCOPIC RETROGRADE CHOLANGIOPANCREATOGRAPHY (ERCP) (N/A)  Patient Location: PACU  Anesthesia Type:General  Level of Consciousness: awake, alert , oriented and patient cooperative  Airway & Oxygen Therapy: Patient Spontanous Breathing and Patient connected to nasal cannula oxygen  Post-op Assessment: Report given to RN, Post -op Vital signs reviewed and stable and Patient moving all extremities X 4  Post vital signs: Reviewed and stable  Last Vitals:  Vitals:   08/24/16 0630 08/24/16 0718  BP: 132/76 113/76  Pulse: 71 (!) 59  Resp: 17 11  Temp: 36.8 C 36.4 C    Last Pain:  Vitals:   08/24/16 0718  TempSrc: Oral  PainSc:       Patients Stated Pain Goal: 0 ([id number])  Complications: No apparent anesthesia complications

## 2016-08-24 NOTE — Op Note (Signed)
[facility] Patient Name: Ashley Howard Procedure Date : 08/24/2016 MRN: [id number] Attending MD: Milus Banister , MD Date of Birth: 1945-09-16 CSN: [id number] Age: 71 Admit Type: Inpatient Procedure:                ERCP Indications:              presented with SBO which resolved quickly; found to                            have dilated bile ducts, CBD stone likely, +                            gallstones on GB; normal liver tests Providers:                Milus Banister, MD, Elmer Ramp. Hinson, RN, Dortha Schwalbe RN, RN, Cletis Athens, Technician Referring MD:              Medicines:                General Anesthesia, Cipro 400 mg IV, Indomethacin                            123XX123 mg PR Complications:            No immediate complications. Estimated blood loss:                            None Estimated Blood Loss:     Estimated blood loss: none. Procedure:                Pre-Anesthesia Assessment:                           - Prior to the procedure, a History and Physical                            was performed, and patient medications and                            allergies were reviewed. The patient's tolerance of                            previous anesthesia was also reviewed. The risks                            and benefits of the procedure and the sedation                            options and risks were discussed with the patient.                            All questions were answered, and informed consent  was obtained. Prior Anticoagulants: The patient has                            taken Lovenox (enoxaparin), last dose was 2 days                            prior to procedure. ASA Grade Assessment: III - A                            patient with severe systemic disease. After                            reviewing the risks and benefits, the patient was                            deemed in satisfactory  condition to undergo the                            procedure.                           After obtaining informed consent, the scope was                            passed under direct vision. Throughout the                            procedure, the patient's blood pressure, pulse, and                            oxygen saturations were monitored continuously. The                            [id number]) scope was introduced through                            the mouth, and used to inject contrast into and                            used to inject contrast into the bile duct. The                            ERCP was accomplished without difficulty. The                            patient tolerated the procedure well. Scope In: Scope Out: Findings:      The scout film was normal. The esophagus was successfully intubated       under direct vision. The scope was advanced to a normal major papilla in       the descending duodenum without detailed examination of the pharynx,       larynx and associated structures, and upper GI tract. The upper GI tract       was grossly normal. The bile duct was deeply cannulated using  a 44       Autotome over a .035 hydrawire and contrast was injected. The       extrahepatic biliary tree was diffusely dilated (CBD 4mm). The cystic       duct was patent and the gallbladder partiall opacified showing       cholelithiasis. There as a single medium sized mobile filling defect in       the CBD (104mm). A biliary sphincterotomy was made with a traction       (standard) sphincterotome. There was no post-sphincterotomy bleeding.       The biliary tree was swept with a 12 mm balloon several times. A single       black stone was delivered into the duodenum. There was no purulence. A       completion, occlusion cholangiogram showed air bubbles but no obvious       remaining CBD stones. The main pancreatic duct was never cannulated with       wire or injected with  dye. Impression:               - Mildly dilated extrahepatic biliary tree.                           - Choledocholithiasis; treated with biliary                            sphincterotomy and balloon sweeping. Recommendation:           - Return patient to hospital ward for ongoing care.                           - General surgery to determine timing of                            cholecystectomy.                           - Will advance diet as tolerated today. Procedure Code(s):        --- Professional ---                           [id number], Endoscopic retrograde                            cholangiopancreatography (ERCP); with removal of                            calculi/debris from biliary/pancreatic duct(s)                           43262, Endoscopic retrograde                            cholangiopancreatography (ERCP); with                            sphincterotomy/papillotomy Diagnosis Code(s):        --- Professional ---  K80.50, Calculus of bile duct without cholangitis                            or cholecystitis without obstruction CPT copyright 2016 American Medical Association. All rights reserved. The codes documented in this report are preliminary and upon coder review may  be revised to meet current compliance requirements. Milus Banister, MD 08/24/2016 9:06:13 AM This report has been signed electronically. Number of Addenda: 0

## 2016-08-24 NOTE — Interval H&P Note (Signed)
History and Physical Interval Note:  08/24/2016 7:24 AM  Ashley Howard  has presented today for surgery, with the diagnosis of gallstone disease (stone in GB and in CBD)  The various methods of treatment have been discussed with the patient and family. After consideration of risks, benefits and other options for treatment, the patient has consented to  Procedure(s): ENDOSCOPIC RETROGRADE CHOLANGIOPANCREATOGRAPHY (ERCP) (N/A) as a surgical intervention .  The patient's history has been reviewed, patient examined, no change in status, stable for surgery.  I have reviewed the patient's chart and labs.  Questions were answered to the patient's satisfaction.     Milus Banister

## 2016-08-24 NOTE — Anesthesia Postprocedure Evaluation (Addendum)
Anesthesia Post Note  Patient: Ashley Howard  Procedure(s) Performed: Procedure(s) (LRB): ENDOSCOPIC RETROGRADE CHOLANGIOPANCREATOGRAPHY (ERCP) (N/A)  Patient location during evaluation: PACU Anesthesia Type: General Level of consciousness: awake and alert Pain management: pain level controlled Vital Signs Assessment: post-procedure vital signs reviewed and stable Respiratory status: spontaneous breathing, nonlabored ventilation, respiratory function stable and patient connected to nasal cannula oxygen Cardiovascular status: blood pressure returned to baseline and stable Postop Assessment: no signs of nausea or vomiting Anesthetic complications: no       Last Vitals:  Vitals:   08/24/16 0914 08/24/16 0915  BP: 113/77   Pulse: (!) 56   Resp:    Temp:  36.4 C    Last Pain:  Vitals:   08/24/16 0915  TempSrc:   PainSc: 0-No pain                 Dima Ferrufino S

## 2016-08-25 LAB — GLUCOSE, CAPILLARY
Glucose-Capillary: 142 mg/dL — ABNORMAL HIGH (ref 65–99)
Glucose-Capillary: 98 mg/dL (ref 65–99)
Glucose-Capillary: 99 mg/dL (ref 65–99)

## 2016-08-25 LAB — CBC
HEMATOCRIT: 26 % — AB (ref 36.0–46.0)
HEMOGLOBIN: 8.3 g/dL — AB (ref 12.0–15.0)
MCH: 31 pg (ref 26.0–34.0)
MCHC: 31.9 g/dL (ref 30.0–36.0)
MCV: 97 fL (ref 78.0–100.0)
Platelets: 516 10*3/uL — ABNORMAL HIGH (ref 150–400)
RBC: 2.68 MIL/uL — ABNORMAL LOW (ref 3.87–5.11)
RDW: 14.5 % (ref 11.5–15.5)
WBC: 13.8 10*3/uL — ABNORMAL HIGH (ref 4.0–10.5)

## 2016-08-25 LAB — BASIC METABOLIC PANEL
Anion gap: 7 (ref 5–15)
BUN: 12 mg/dL (ref 6–20)
CALCIUM: 9.2 mg/dL (ref 8.9–10.3)
CHLORIDE: 94 mmol/L — AB (ref 101–111)
CO2: 29 mmol/L (ref 22–32)
CREATININE: 0.8 mg/dL (ref 0.44–1.00)
GFR calc Af Amer: >60 mL/min (ref >60)
GFR calc non Af Amer: >60 mL/min (ref >60)
GLUCOSE: 109 mg/dL — AB (ref 65–99)
Potassium: 4.4 mmol/L (ref 3.5–5.1)
Sodium: 130 mmol/L — ABNORMAL LOW (ref 135–145)

## 2016-08-25 LAB — SURGICAL PCR SCREEN
MRSA, PCR: NEGATIVE
STAPHYLOCOCCUS AUREUS: NEGATIVE

## 2016-08-25 MED ORDER — CEFAZOLIN SODIUM-DEXTROSE 2-4 GM/100ML-% IV SOLN
2.0000 g | INTRAVENOUS | Status: AC
  Administered 2016-08-26: 1 g via INTRAVENOUS
  Filled 2016-08-25: qty 100

## 2016-08-25 MED ORDER — CHLORHEXIDINE GLUCONATE CLOTH 2 % EX PADS: 6.0000 | MEDICATED_PAD | Freq: Once | CUTANEOUS | Status: DC

## 2016-08-25 MED ORDER — CHLORHEXIDINE GLUCONATE CLOTH 2 % EX PADS
6.0000 | MEDICATED_PAD | Freq: Once | CUTANEOUS | Status: AC
  Administered 2016-08-25: 6 via TOPICAL

## 2016-08-25 NOTE — Progress Notes (Signed)
Pt for lap chole tomorrow with consent and NPO midnight instructed and understood.
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
## 2016-08-25 NOTE — Progress Notes (Signed)
1 Day Post-Op  Subjective: *pt feels well after ERCP Tolerating diet and bowels moving   Objective: Vital signs in last 24 hours: Temp:  [97.5 F (36.4 C)-99.3 F (37.4 C)] 99.3 F (37.4 C) (02/11 0503) Pulse Rate:  [56-77] 63 (02/11 0503) Resp:  [12-18] 17 (02/11 0503) BP: (106-138)/(63-86) 115/63 (02/11 0503) SpO2:  [98 %-100 %] 100 % (02/11 0503) Last BM Date: 08/23/16  Intake/Output from previous day: 02/10 0701 - 02/11 0700 In: 2480 [P.O.:1980; I.V.:500] Out: 650 [Urine:650] Intake/Output this shift: No intake/output data recorded.  GI: mild distetion soft  lower midline scar  Lab Results:   Recent Labs  08/23/16 0554 08/25/16 0308  WBC 7.3 13.8*  HGB 8.6* 8.3*  HCT 26.9* 26.0*  PLT 582* 516*   BMET  Recent Labs  08/23/16 0554 08/25/16 0308  NA 136 130*  K 3.5 4.4  CL 101 94*  CO2 26 29  GLUCOSE 86 109*  BUN 8 12  CREATININE 0.65 0.80  CALCIUM 8.9 9.2   PT/INR No results for input(s): LABPROT, INR in the last 72 hours. ABG No results for input(s): PHART, HCO3 in the last 72 hours.  Invalid input(s): PCO2, PO2  Studies/Results: Dg Ercp Biliary & Pancreatic Ducts  Result Date: 08/24/2016 CLINICAL DATA:  Cholelithiasis with biliary ductal dilatation, choledocholithiasis suspected on CT EXAM: ERCP TECHNIQUE: Multiple spot images obtained with the fluoroscopic device and submitted for interpretation post-procedure. FLUOROSCOPY TIME:  1 minutes 31 second COMPARISON:  CT 08/21/2016 FINDINGS: Two spot images document endoscopic cannulation and opacification of the CBD with advancement of a catheter into the proximal CBD. There is incomplete opacification of the intrahepatic biliary tree which appears mildly distended centrally. Cystic duct is patent. Partial opacification of the gallbladder lumen demonstrating multiple filling defects . Possible filling defects in the CBD on the second image. IMPRESSION: 1. Cholelithiasis and possible choledocholithiasis,  with endoscopic intervention. These images were submitted for radiologic interpretation only. Please see the procedural report for the amount of contrast and the fluoroscopy time utilized. Electronically Signed   By: Lucrezia Europe M.D.   On: 08/24/2016 09:27    Anti-infectives: Anti-infectives    None      Assessment/Plan: Patient Active Problem List   Diagnosis Date Noted  . SBO (small bowel obstruction) 08/21/2016  . Choledocholithiasis 08/21/2016  . Urinary incontinence 08/21/2016  . GERD (gastroesophageal reflux disease) 08/21/2016  . Neuropathic pain 08/21/2016  . Hyperglycemia 08/21/2016  . History of aspiration pneumonia 07/30/2016  . Septic shock ([place])   . Protein-calorie malnutrition, severe 07/20/2016  . Small bowel obstruction 07/16/2016  . Dehydration 07/16/2016  . Abnormality of gait 01/04/2013    SBO resolved  Will tentatively set up for [facility] Monday for Dr Grandville Silos Discussed with the patient    LOS: 4 days    Jacobo Moncrief A. 08/25/2016

## 2016-08-25 NOTE — NC FL2 (Signed)
[place] LEVEL OF CARE SCREENING TOOL     IDENTIFICATION  Patient Name: Ashley Howard Birthdate: September 06, 1945 Sex: female Admission Date (Current Location): 08/21/2016  [facility] and [place] Number:  Herbalist and Address:  [facility] [place], [place] [address] [place], [place] [address]      Provider Number: [id number]  Attending Physician Name and Address:  Jonetta Osgood, MD  Relative Name and Phone Number:  Son [phone]    Current Level of Care: Hospital Recommended Level of Care: [place] Prior Approval Number:    Date Approved/Denied:   PASRR Number: [id number]  Discharge Plan: SNF    Current Diagnoses: Patient Active Problem List   Diagnosis Date Noted  . SBO (small bowel obstruction) 08/21/2016  . Choledocholithiasis 08/21/2016  . Urinary incontinence 08/21/2016  . GERD (gastroesophageal reflux disease) 08/21/2016  . Neuropathic pain 08/21/2016  . Hyperglycemia 08/21/2016  . History of aspiration pneumonia 07/30/2016  . Septic shock ([place])   . Protein-calorie malnutrition, severe 07/20/2016  . Small bowel obstruction 07/16/2016  . Dehydration 07/16/2016  . Abnormality of gait 01/04/2013    Orientation RESPIRATION BLADDER Height & Weight     Self, Time, Situation, Place  Normal Continent Weight: 110 lb (49.9 kg) Height:  4\' 11"  (149.9 cm)  BEHAVIORAL SYMPTOMS/MOOD NEUROLOGICAL BOWEL NUTRITION STATUS      Continent    AMBULATORY STATUS COMMUNICATION OF NEEDS Skin   Limited Assist Verbally Normal                       Personal Care Assistance Level of Assistance  Bathing, Dressing Bathing Assistance: Limited assistance   Dressing Assistance: Limited assistance     Functional Limitations Info  Sight, Hearing, Speech Sight Info: Adequate Hearing Info: Adequate Speech Info: Adequate    SPECIAL CARE FACTORS FREQUENCY  PT (By licensed PT), OT (By licensed OT)     PT Frequency: 5x  wk OT Frequency: 5x wk            Contractures Contractures Info: Not present    Additional Factors Info  Code Status, Allergies Code Status Info: Full Allergies Info: Tape           Current Medications (08/25/2016):  This is the current hospital active medication list Current Facility-Administered Medications  Medication Dose Route Frequency Provider Last Rate Last Dose  . acetaminophen (TYLENOL) tablet 650 mg  650 mg Oral Q6H PRN Waldemar Dickens, MD       Or  . acetaminophen (TYLENOL) suppository 650 mg  650 mg Rectal Q6H PRN Waldemar Dickens, MD      . albuterol (PROVENTIL) (2.5 MG/3ML) 0.083% nebulizer solution 2.5 mg  2.5 mg Nebulization Q2H PRN Jonetta Osgood, MD      . Derrill Memo ON 08/26/2016] ceFAZolin (ANCEF) IVPB 2g/100 mL premix  2 g Intravenous On Call to [place], MD      . Chlorhexidine Gluconate Cloth 2 % PADS 6 each  6 each Topical Once Erroll Luna, MD       And  . Chlorhexidine Gluconate Cloth 2 % PADS 6 each  6 each Topical Once Erroll Luna, MD      . famotidine (PEPCID) tablet 20 mg  20 mg Oral Daily Jonetta Osgood, MD   20 mg at 08/25/16 0917  . feeding supplement (ENSURE ENLIVE) (ENSURE ENLIVE) liquid 237 mL  237 mL Oral TID BM Shanker M  Ghimire, MD   237 mL at 08/25/16 1403  . gabapentin (NEURONTIN) capsule 300 mg  300 mg Oral TID Jonetta Osgood, MD   300 mg at 08/25/16 0917  . morphine 2 MG/ML injection 2 mg  2 mg Intravenous Q4H PRN Jonetta Osgood, MD      . ondansetron [facility]) tablet 4 mg  4 mg Oral Q6H PRN Waldemar Dickens, MD       Or  . ondansetron [facility]) injection 4 mg  4 mg Intravenous Q6H PRN Waldemar Dickens, MD      . oxybutynin [facility]) tablet 5 mg  5 mg Oral BID Jonetta Osgood, MD   5 mg at 08/25/16 0917  . pravastatin (PRAVACHOL) tablet 20 mg  20 mg Oral q1800 Jonetta Osgood, MD   20 mg at 08/24/16 1842  . simethicone (MYLICON) chewable tablet 80 mg  80 mg Oral QID PRN Jonetta Osgood, MD         Discharge  Medications: Please see discharge summary for a list of discharge medications.  Relevant Imaging Results:  Relevant Lab Results:   Additional Information    Kazim Corrales B, LCSWA

## 2016-08-25 NOTE — Progress Notes (Signed)
[facility] Progress Note    Since last GI note: ERCP yesterday see full report in chart; removed CBD stone after biliary sphincterotomy.  She's felt fine since then; eating regular food without nausea, vomiting or abd pains.  Objective: Vital signs in last 24 hours: Temp:  [97.5 F (36.4 C)-99.3 F (37.4 C)] 99.3 F (37.4 C) (02/11 0503) Pulse Rate:  [56-77] 63 (02/11 0503) Resp:  [11-18] 17 (02/11 0503) BP: (106-138)/(63-86) 115/63 (02/11 0503) SpO2:  [96 %-100 %] 100 % (02/11 0503) Weight:  [110 lb (49.9 kg)] 110 lb (49.9 kg) (02/10 0718) Last BM Date: 08/23/16 General: alert and oriented times 3 Heart: regular rate and rythm Abdomen: soft, non-tender, non-distended, normal bowel sounds   Lab Results:  Recent Labs  08/23/16 0554 08/25/16 0308  WBC 7.3 13.8*  HGB 8.6* 8.3*  PLT 582* 516*  MCV 98.5 97.0    Recent Labs  08/23/16 0554 08/25/16 0308  NA 136 130*  K 3.5 4.4  CL 101 94*  CO2 26 29  GLUCOSE 86 109*  BUN 8 12  CREATININE 0.65 0.80  CALCIUM 8.9 9.2    Recent Labs  08/23/16 0554  PROT 5.6*  ALBUMIN 2.3*  AST 20  ALT 11*  ALKPHOS 64  BILITOT 0.8    Medications: Scheduled Meds: . famotidine  20 mg Oral Daily  . feeding supplement (ENSURE ENLIVE)  237 mL Oral TID BM  . gabapentin  300 mg Oral TID  . oxybutynin  5 mg Oral BID  . pravastatin  20 mg Oral q1800   Continuous Infusions: PRN Meds:.acetaminophen **OR** acetaminophen, albuterol, morphine injection, ondansetron **OR** ondansetron (ZOFRAN) IV, simethicone    Assessment/Plan: 71 y.o. female with resolved SBO, gallstone disease  The SBO resolved and so we addressed her gallstone disease yesterday with ERCP (sphincerotomy and CBD stone removal).  She has no sign of post ERCP complications.  Plan is for cholecystectomy this admission, timing per general surgery team. Please call, page if any further questions or concerns.    Milus Banister, MD  08/25/2016, 7:10  AM [facility] Pager [phone]

## 2016-08-25 NOTE — Progress Notes (Signed)
PROGRESS NOTE        PATIENT DETAILS Name: Ashley Howard Age: 71 y.o. Sex: female Date of Birth: 1945/12/06 Admit Date: 08/21/2016 Admitting Physician Waldemar Dickens, MD [id number] [name], MD  Brief Narrative: Patient is a 71 y.o. female with prior history of SBO (most recently in January 2018), dyslipidemia, GERD admitted on 2/7 with abdominal pain. Subsequently found to have a small bowel obstruction and a choledocholithiasis. See below for further details.  Subjective: Denies any abdominal pain. Had numerous bowel movements last night.  Assessment/Plan: Small bowel obstruction: Resolved with supportive measures. NG tube has been removed. Abdomen remained soft and nontender. Diet advanced to a regular diet. Gen. surgery continues to follow.   Choledocholithiasis: Underwent ERCP on 2/10-awaiting general surgery planning on laparoscopic cholecystectomy on 2/12.  GERD: Continue Pepcid.  Anemia: Suspect hemoglobin of 11.0 on admission was due to hemoconcentration, recent hemoglobin appears to be around 9. Slight drop in hemoglobin likely due to IV fluid dilution. Hemoglobin appears stable-follow  Urinary incontinence: s/p bladder surgery, continue ditropan   HLD: continue statin   CHronic Neuropathic pain: resume neurontin  Protein-calorie malnutrition, severe: Start supplements   DVT Prophylaxis: SCD's in anticipation of ERCP/cholecystectomy  Code Status: Full code   Family Communication: None at bedside  Disposition Plan: Remain inpatient-back to SNF sometime in the next 2-3 days depending on how she does post cholecystectomy  Antimicrobial agents: Anti-infectives    None     Procedures: None  CONSULTS:  GI and general surgery  Time spent: 25- minutes-Greater than 50% of this time was spent in counseling, explanation of diagnosis, planning of further management, and coordination of care.  MEDICATIONS: Scheduled Meds: .  famotidine  20 mg Oral Daily  . feeding supplement (ENSURE ENLIVE)  237 mL Oral TID BM  . gabapentin  300 mg Oral TID  . oxybutynin  5 mg Oral BID  . pravastatin  20 mg Oral q1800   Continuous Infusions:  PRN Meds:.acetaminophen **OR** acetaminophen, albuterol, morphine injection, ondansetron **OR** ondansetron (ZOFRAN) IV, simethicone   PHYSICAL EXAM: Vital signs: Vitals:   08/24/16 1004 08/24/16 1424 08/24/16 2159 08/25/16 0503  BP: 121/66 138/75 132/64 115/63  Pulse: 64 77 68 63  Resp: 18 18 17 17   Temp: 97.6 F (36.4 C) 98.3 F (36.8 C) 98.2 F (36.8 C) 99.3 F (37.4 C)  TempSrc: Oral Oral Oral Oral  SpO2: 100% 98% 99% 100%  Weight:      Height:       Filed Weights   08/22/16 0700 08/24/16 0718  Weight: 50 kg (110 lb 3.7 oz) 49.9 kg (110 lb)   Body mass index is 22.22 kg/m.   General appearance :Awake, alert, not in any distress. Speech Clear.  Eyes:, pupils equally reactive to light and accomodation,no scleral icterus. HEENT: Atraumatic and Normocephalic Neck: supple, no JVD.  Resp:Good air entry bilaterally, no added sounds  CVS: S1 S2 regular, no murmurs.  GI: Bowel sounds +, Non tender and not distended with no gaurding, rigidity or rebound.No organomegaly Extremities: B/L Lower Ext shows no edema, both legs are warm to touch Neurology:  speech clear,Non focal, sensation is grossly intact. Psychiatric: Normal judgment and insight. Alert and oriented x 3. Normal mood. Musculoskeletal:No digital cyanosis Skin:No Rash, warm and dry Wounds:N/A  I have personally reviewed following labs and imaging studies  LABORATORY  DATA: CBC:  Recent Labs Lab 08/21/16 1005 08/22/16 0527 08/23/16 0554 08/25/16 0308  WBC 15.0* 9.4 7.3 13.8*  HGB 11.0* 8.3* 8.6* 8.3*  HCT 33.8* 25.5* 26.9* 26.0*  MCV 95.8 97.0 98.5 97.0  PLT 702* 561* 582* 516*    Basic Metabolic Panel:  Recent Labs Lab 08/21/16 1005 08/22/16 0527 08/23/16 0554 08/25/16 0308  NA 133* 138  136 130*  K 4.7 4.1 3.5 4.4  CL 95* 104 101 94*  CO2 27 25 26 29   GLUCOSE 118* 92 86 109*  BUN 13 15 8 12   CREATININE 0.77 0.72 0.65 0.80  CALCIUM 10.4* 8.7* 8.9 9.2    GFR: Estimated Creatinine Clearance: 43.4 mL/min (by C-G formula based on SCr of 0.8 mg/dL).  Liver Function Tests:  Recent Labs Lab 08/21/16 1005 08/22/16 0527 08/23/16 0554  AST 26 17 20   ALT 19 14 11*  ALKPHOS 97 69 64  BILITOT 0.4 0.2* 0.8  PROT 7.5 5.6* 5.6*  ALBUMIN 3.1* 2.2* 2.3*    Recent Labs Lab 08/21/16 1005  LIPASE 22   No results for input(s): AMMONIA in the last 168 hours.  Coagulation Profile:  Recent Labs Lab 08/21/16 1732  INR 1.07    Cardiac Enzymes: No results for input(s): CKTOTAL, CKMB, CKMBINDEX, TROPONINI in the last 168 hours.  BNP (last 3 results) No results for input(s): PROBNP in the last 8760 hours.  HbA1C: No results for input(s): HGBA1C in the last 72 hours.  CBG:  Recent Labs Lab 08/23/16 1138 08/24/16 0005 08/24/16 1554 08/25/16 0036 08/25/16 0744  GLUCAP 171* 121* 125* 142* 98    Lipid Profile: No results for input(s): CHOL, HDL, LDLCALC, TRIG, CHOLHDL, LDLDIRECT in the last 72 hours.  Thyroid Function Tests: No results for input(s): TSH, T4TOTAL, FREET4, T3FREE, THYROIDAB in the last 72 hours.  Anemia Panel: No results for input(s): VITAMINB12, FOLATE, FERRITIN, TIBC, IRON, RETICCTPCT in the last 72 hours.  Urine analysis:    Component Value Date/Time   COLORURINE YELLOW 08/21/2016 1650   APPEARANCEUR CLOUDY (A) 08/21/2016 1650   LABSPEC >1.046 (H) 08/21/2016 1650   PHURINE 6.0 08/21/2016 1650   GLUCOSEU NEGATIVE 08/21/2016 1650   HGBUR NEGATIVE 08/21/2016 1650   BILIRUBINUR NEGATIVE 08/21/2016 1650   KETONESUR NEGATIVE 08/21/2016 1650   PROTEINUR NEGATIVE 08/21/2016 1650   UROBILINOGEN 0.2 06/27/2016 1216   NITRITE NEGATIVE 08/21/2016 1650   LEUKOCYTESUR NEGATIVE 08/21/2016 1650    Sepsis Labs: Lactic Acid, Venous      Component Value Date/Time   LATICACIDVEN 0.58 08/21/2016 1604    MICROBIOLOGY: No results found for this or any previous visit (from the past 240 hour(s)).  RADIOLOGY STUDIES/RESULTS: Ct Abdomen Pelvis W Contrast  Result Date: 08/21/2016 CLINICAL DATA:  Abdominal pain and distention.  Constipation. EXAM: CT ABDOMEN AND PELVIS WITH CONTRAST TECHNIQUE: Multidetector CT imaging of the abdomen and pelvis was performed using the standard protocol following bolus administration of intravenous contrast. CONTRAST:  156mL ISOVUE-300 IOPAMIDOL (ISOVUE-300) INJECTION 61% COMPARISON:  07/16/2016 FINDINGS: Lower chest: Mild atelectasis in both lower lobes. No pleural or pericardial fluid. Small hiatal hernia. Hepatobiliary: No liver parenchymal abnormality is seen. There is intra and extrahepatic biliary ductal dilatation. There are multiple stones in the gallbladder. There appears to be a filling defect in the distal common duct, non calcified, most consistent with a passing stone. Pancreas: Normal Spleen: Normal Adrenals/Urinary Tract: Normal appearance of the kidneys except for vascular calcification. Adrenal glands are normal. Stomach/Bowel: Markedly dilated fluid filled loops of  small intestine consistent with small bowel obstruction. The colon is essentially collapsed. Few loops of collapsed ileum are noted. Vascular/Lymphatic: Aortic atherosclerosis. No aneurysm. IVC is normal. Reproductive: Previous hysterectomy.  No pelvic mass. Other: Small amount of free fluid.  No free air. Musculoskeletal: Curvature in chronic degenerative change of the spine. Superior endplate fracture of L2 is chronic. IMPRESSION: High-grade small bowel obstruction. Small amount of free fluid. No free air. Chololithiasis. Intra and extra hepatic biliary ductal dilatation. Probable choledocholithiasis with a stone or stones in the distal duct. Electronically Signed   By: Nelson Chimes M.D.   On: 08/21/2016 13:59   Dg Chest Port 1  View  Result Date: 08/21/2016 CLINICAL DATA:  Preoperative respiratory evaluation. EXAM: PORTABLE CHEST 1 VIEW COMPARISON:  08/21/2016, earlier the same day. FINDINGS: The lungs are clear wiithout focal pneumonia, edema, pneumothorax or pleural effusion. The cardiopericardial silhouette is within normal limits for size. NG tube tip is in the proximal stomach with proximal port of the NG tube in the distal esophagus. Bones are diffusely demineralized. IMPRESSION: 1. No acute cardiopulmonary findings. 2. The tip of the NG tube is just barely into the stomach with proximal port in the esophagus. Electronically Signed   By: Misty Stanley M.D.   On: 08/21/2016 18:05   Dg Ercp Biliary & Pancreatic Ducts  Result Date: 08/24/2016 CLINICAL DATA:  Cholelithiasis with biliary ductal dilatation, choledocholithiasis suspected on CT EXAM: ERCP TECHNIQUE: Multiple spot images obtained with the fluoroscopic device and submitted for interpretation post-procedure. FLUOROSCOPY TIME:  1 minutes 31 second COMPARISON:  CT 08/21/2016 FINDINGS: Two spot images document endoscopic cannulation and opacification of the CBD with advancement of a catheter into the proximal CBD. There is incomplete opacification of the intrahepatic biliary tree which appears mildly distended centrally. Cystic duct is patent. Partial opacification of the gallbladder lumen demonstrating multiple filling defects . Possible filling defects in the CBD on the second image. IMPRESSION: 1. Cholelithiasis and possible choledocholithiasis, with endoscopic intervention. These images were submitted for radiologic interpretation only. Please see the procedural report for the amount of contrast and the fluoroscopy time utilized. Electronically Signed   By: Lucrezia Europe M.D.   On: 08/24/2016 09:27   Dg Abdomen Acute W/chest  Result Date: 08/21/2016 CLINICAL DATA:  Pain and abdominal distention for 2 days with nausea. EXAM: DG ABDOMEN ACUTE W/ 1V CHEST COMPARISON:  Chest  radiograph 07/19/2016. FINDINGS: Frontal view of the chest shows midline trachea and normal heart size. Thoracic aorta is calcified. Biapical pleuroparenchymal scarring. No airspace consolidation or pleural fluid. There is marked gaseous distention of colon with a fair amount of stool and air-fluid levels. Gas is seen in the rectosigmoid colon. There may be bowel gas extending into the right inguinal canal. No definite small bowel dilatation. Dextroconvex scoliosis, centered near the thoracolumbar junction, with degenerative changes. IMPRESSION: 1. Bowel gas pattern is indicative of a colonic ileus. Distal colorectal obstruction cannot be excluded. 2. There may be bowel gas extending into the right inguinal canal. Difficult to exclude a hernia. 3. CT abdomen pelvis with contrast may be helpful in further evaluation, as clinically indicated. Electronically Signed   By: Lorin Picket M.D.   On: 08/21/2016 13:34   Dg Abd Portable 1v-small Bowel Obstruction Protocol-initial, 8 Hr Delay  Result Date: 08/22/2016 CLINICAL DATA:  Small bowel obstruction.  8 hour delayed film. EXAM: PORTABLE ABDOMEN - 1 VIEW COMPARISON:  08/21/2016 FINDINGS: Enteric tube tip is in the left upper quadrant consistent with location in  the stomach. The tube is kinked at the proximal side hole. Positioning is not significantly changed since prior study. Contrast material is demonstrated throughout the colon and into the rectum. Presence of contrast material in the colon and mild gaseous distention of small bowel suggests partial obstruction or ileus. IMPRESSION: Contrast material demonstrated throughout the colon. Electronically Signed   By: Lucienne Capers M.D.   On: 08/22/2016 06:24   Dg Abd Portable 1v  Result Date: 08/21/2016 CLINICAL DATA:  Nasogastric tube placement. EXAM: PORTABLE ABDOMEN - 1 VIEW COMPARISON:  Radiographs of same day. FINDINGS: Distal tip of nasogastric tube is seen in proximal stomach. Dilated small bowel loops  are again noted. IMPRESSION: Distal tip of nasogastric tube is seen in expected position of proximal stomach. Dilated small bowel loops are noted concerning for distal small bowel obstruction. Electronically Signed   By: Marijo Conception, M.D.   On: 08/21/2016 20:28   Dg Abd Portable 1 View  Result Date: 08/21/2016 CLINICAL DATA:  71 year old female enteric tube placement. Initial encounter. EXAM: PORTABLE ABDOMEN - 1 VIEW COMPARISON:  CT Abdomen and Pelvis 1342 hours today. FINDINGS: Portable AP supine view at 1646 hours. Enteric tube courses to the stomach. The side hole is in the distal thoracic esophagus. Negative lung bases. Small volume of excreted IV contrast in the renal collecting systems stable bowel gas pattern. IMPRESSION: 1. Enteric tube side hole in the distal thoracic esophagus. Advance 8 cm to ensure side hole placement within the stomach. 2. Stable bowel gas pattern from the CT Abdomen and Pelvis today. Electronically Signed   By: Genevie Ann M.D.   On: 08/21/2016 16:57     LOS: 4 days   Oren Binet, MD  [facility] Pager:[phone]  If 7PM-7AM, please contact night-coverage [email or web address] Password [facility] 08/25/2016, 11:17 AM

## 2016-08-26 ENCOUNTER — Encounter (HOSPITAL_COMMUNITY)
Admission: EM | Disposition: A | Discharge status: Home Health | Payer: Self-pay | Source: Home / Self Care | Attending: Internal Medicine

## 2016-08-26 ENCOUNTER — Encounter (HOSPITAL_COMMUNITY): Payer: Self-pay | Admitting: Gastroenterology

## 2016-08-26 ENCOUNTER — Inpatient Hospital Stay (HOSPITAL_COMMUNITY): Payer: Medicare Other | Admitting: Anesthesiology

## 2016-08-26 DIAGNOSIS — R32 Unspecified urinary incontinence: Secondary | ICD-10-CM

## 2016-08-26 HISTORY — PX: CHOLECYSTECTOMY: SHX55

## 2016-08-26 HISTORY — PX: LAPAROSCOPIC LYSIS OF ADHESIONS: SHX5905

## 2016-08-26 LAB — GLUCOSE, CAPILLARY
Glucose-Capillary: 103 mg/dL — ABNORMAL HIGH (ref 65–99)
Glucose-Capillary: 106 mg/dL — ABNORMAL HIGH (ref 65–99)
Glucose-Capillary: 192 mg/dL — ABNORMAL HIGH (ref 65–99)

## 2016-08-26 LAB — COMPREHENSIVE METABOLIC PANEL
ALBUMIN: 2.9 g/dL — AB (ref 3.5–5.0)
ALK PHOS: 86 U/L (ref 38–126)
ALT: 12 U/L — ABNORMAL LOW (ref 14–54)
ANION GAP: 10 (ref 5–15)
AST: 20 U/L (ref 15–41)
BILIRUBIN TOTAL: 0.5 mg/dL (ref 0.3–1.2)
BUN: 14 mg/dL (ref 6–20)
CALCIUM: 9.5 mg/dL (ref 8.9–10.3)
CO2: 30 mmol/L (ref 22–32)
Chloride: 93 mmol/L — ABNORMAL LOW (ref 101–111)
Creatinine, Ser: 0.69 mg/dL (ref 0.44–1.00)
GFR calc Af Amer: >60 mL/min (ref >60)
GFR calc non Af Amer: >60 mL/min (ref >60)
GLUCOSE: 104 mg/dL — AB (ref 65–99)
Potassium: 4.3 mmol/L (ref 3.5–5.1)
SODIUM: 133 mmol/L — AB (ref 135–145)
TOTAL PROTEIN: 6.6 g/dL (ref 6.5–8.1)

## 2016-08-26 SURGERY — LAPAROSCOPIC CHOLECYSTECTOMY
Anesthesia: General | Site: Abdomen

## 2016-08-26 MED ORDER — DIPHENHYDRAMINE HCL 50 MG/ML IJ SOLN
INTRAMUSCULAR | Status: AC
  Filled 2016-08-26: qty 1

## 2016-08-26 MED ORDER — ROCURONIUM BROMIDE 50 MG/5ML IV SOSY
PREFILLED_SYRINGE | INTRAVENOUS | Status: AC
  Filled 2016-08-26: qty 10

## 2016-08-26 MED ORDER — PROPOFOL 10 MG/ML IV BOLUS
INTRAVENOUS | Status: DC | PRN
  Administered 2016-08-26: 100 mg via INTRAVENOUS

## 2016-08-26 MED ORDER — LIDOCAINE HCL (CARDIAC) 20 MG/ML IV SOLN
INTRAVENOUS | Status: DC | PRN
  Administered 2016-08-26: 50 mg via INTRATRACHEAL

## 2016-08-26 MED ORDER — SUGAMMADEX SODIUM 200 MG/2ML IV SOLN
INTRAVENOUS | Status: DC | PRN
  Administered 2016-08-26: 200 mg via INTRAVENOUS

## 2016-08-26 MED ORDER — IOPAMIDOL (ISOVUE-300) INJECTION 61%
INTRAVENOUS | Status: AC
Start: 2016-08-26 — End: 2016-08-26
  Filled 2016-08-26: qty 50

## 2016-08-26 MED ORDER — SODIUM CHLORIDE 0.9 % IR SOLN
Status: DC | PRN
  Administered 2016-08-26: 1000 mL

## 2016-08-26 MED ORDER — DEXAMETHASONE SODIUM PHOSPHATE 10 MG/ML IJ SOLN
INTRAMUSCULAR | Status: AC
  Filled 2016-08-26: qty 1

## 2016-08-26 MED ORDER — ROCURONIUM BROMIDE 100 MG/10ML IV SOLN
INTRAVENOUS | Status: DC | PRN
  Administered 2016-08-26: 40 mg via INTRAVENOUS

## 2016-08-26 MED ORDER — BUPIVACAINE HCL (PF) 0.25 % IJ SOLN
INTRAMUSCULAR | Status: AC
Start: 2016-08-26 — End: 2016-08-26
  Filled 2016-08-26: qty 30

## 2016-08-26 MED ORDER — GLYCOPYRROLATE 0.2 MG/ML IJ SOLN
INTRAMUSCULAR | Status: DC | PRN
  Administered 2016-08-26: 0.3 mg via INTRAVENOUS

## 2016-08-26 MED ORDER — TRAMADOL HCL 50 MG PO TABS
50.0000 mg | ORAL_TABLET | Freq: Four times a day (QID) | ORAL | Status: DC | PRN
  Administered 2016-08-26 – 2016-08-28 (×5): 50 mg via ORAL
  Filled 2016-08-26 (×5): qty 1

## 2016-08-26 MED ORDER — BUPIVACAINE HCL (PF) 0.25 % IJ SOLN
INTRAMUSCULAR | Status: DC | PRN
  Administered 2016-08-26: 18 mL

## 2016-08-26 MED ORDER — MIDAZOLAM HCL 2 MG/2ML IJ SOLN
INTRAMUSCULAR | Status: AC
  Filled 2016-08-26: qty 2

## 2016-08-26 MED ORDER — NEOSTIGMINE METHYLSULFATE 5 MG/5ML IV SOSY
PREFILLED_SYRINGE | INTRAVENOUS | Status: AC
  Filled 2016-08-26: qty 5

## 2016-08-26 MED ORDER — SUGAMMADEX SODIUM 200 MG/2ML IV SOLN
INTRAVENOUS | Status: AC
  Filled 2016-08-26: qty 6

## 2016-08-26 MED ORDER — FENTANYL CITRATE (PF) 100 MCG/2ML IJ SOLN
INTRAMUSCULAR | Status: DC | PRN
  Administered 2016-08-26 (×2): 100 ug via INTRAVENOUS

## 2016-08-26 MED ORDER — LACTATED RINGERS IV SOLN
INTRAVENOUS | Status: DC
  Administered 2016-08-26 – 2016-08-27 (×3): via INTRAVENOUS

## 2016-08-26 MED ORDER — SCOPOLAMINE 1 MG/3DAYS TD PT72
1.0000 | MEDICATED_PATCH | TRANSDERMAL | Status: DC
  Administered 2016-08-26 – 2016-08-29 (×2): 1.5 mg via TRANSDERMAL
  Filled 2016-08-26 (×2): qty 1

## 2016-08-26 MED ORDER — PROMETHAZINE HCL 25 MG/ML IJ SOLN: 6.2500 mg | INTRAMUSCULAR | Status: DC | PRN

## 2016-08-26 MED ORDER — IOPAMIDOL (ISOVUE-300) INJECTION 61%
INTRAVENOUS | Status: DC | PRN
  Administered 2016-08-26: .1 mL

## 2016-08-26 MED ORDER — 0.9 % SODIUM CHLORIDE (POUR BTL) OPTIME
TOPICAL | Status: DC | PRN
  Administered 2016-08-26: 1000 mL

## 2016-08-26 MED ORDER — LIDOCAINE 2% (20 MG/ML) 5 ML SYRINGE
INTRAMUSCULAR | Status: AC
  Filled 2016-08-26: qty 10

## 2016-08-26 MED ORDER — FENTANYL CITRATE (PF) 100 MCG/2ML IJ SOLN
INTRAMUSCULAR | Status: AC
  Filled 2016-08-26: qty 4

## 2016-08-26 MED ORDER — HYDROMORPHONE HCL 1 MG/ML IJ SOLN
INTRAMUSCULAR | Status: AC
  Filled 2016-08-26: qty 0.5

## 2016-08-26 MED ORDER — PHENYLEPHRINE 40 MCG/ML (10ML) SYRINGE FOR IV PUSH (FOR BLOOD PRESSURE SUPPORT)
PREFILLED_SYRINGE | INTRAVENOUS | Status: AC
  Filled 2016-08-26: qty 10

## 2016-08-26 MED ORDER — PHENYLEPHRINE HCL 10 MG/ML IJ SOLN
INTRAMUSCULAR | Status: DC | PRN
  Administered 2016-08-26 (×2): 120 ug via INTRAVENOUS

## 2016-08-26 MED ORDER — SCOPOLAMINE 1 MG/3DAYS TD PT72
MEDICATED_PATCH | TRANSDERMAL | Status: AC
  Administered 2016-08-26: 1.5 mg via TRANSDERMAL
  Filled 2016-08-26: qty 1

## 2016-08-26 MED ORDER — PROPOFOL 10 MG/ML IV BOLUS
INTRAVENOUS | Status: AC
  Filled 2016-08-26: qty 20

## 2016-08-26 MED ORDER — HYDROMORPHONE HCL 1 MG/ML IJ SOLN
0.2500 mg | INTRAMUSCULAR | Status: DC | PRN
  Administered 2016-08-26 (×2): 0.5 mg via INTRAVENOUS

## 2016-08-26 MED ORDER — ONDANSETRON HCL 4 MG/2ML IJ SOLN
INTRAMUSCULAR | Status: AC
  Filled 2016-08-26: qty 6

## 2016-08-26 SURGICAL SUPPLY — 45 items
ADH SKN CLS APL DERMABOND .7 (GAUZE/BANDAGES/DRESSINGS) ×2
APPLIER CLIP 5 13 M/L LIGAMAX5 (MISCELLANEOUS) ×4
APR CLP MED LRG 5 ANG JAW (MISCELLANEOUS) ×2
BAG SPEC RTRVL 10 TROC 200 (ENDOMECHANICALS) ×2
BLADE SURG ROTATE 9660 (MISCELLANEOUS) IMPLANT
CANISTER SUCTION 2500CC (MISCELLANEOUS) ×4 IMPLANT
CHLORAPREP W/TINT 26ML (MISCELLANEOUS) ×4 IMPLANT
CLIP APPLIE 5 13 M/L LIGAMAX5 (MISCELLANEOUS) ×2 IMPLANT
COVER MAYO STAND STRL (DRAPES) ×1 IMPLANT
COVER SURGICAL LIGHT HANDLE (MISCELLANEOUS) ×4 IMPLANT
DERMABOND ADVANCED (GAUZE/BANDAGES/DRESSINGS) ×2
DERMABOND ADVANCED .7 DNX12 (GAUZE/BANDAGES/DRESSINGS) ×2 IMPLANT
DRAPE C-ARM 42X72 X-RAY (DRAPES) ×1 IMPLANT
ELECT REM PT RETURN 9FT ADLT (ELECTROSURGICAL) ×4
ELECTRODE REM PT RTRN 9FT ADLT (ELECTROSURGICAL) ×2 IMPLANT
FILTER SMOKE EVAC LAPAROSHD (FILTER) IMPLANT
GLOVE BIO SURGEON STRL SZ8 (GLOVE) ×4 IMPLANT
GLOVE BIOGEL PI IND STRL 8 (GLOVE) ×2 IMPLANT
GLOVE BIOGEL PI INDICATOR 8 (GLOVE) ×2
GOWN STRL REUS W/ TWL LRG LVL3 (GOWN DISPOSABLE) ×4 IMPLANT
GOWN STRL REUS W/ TWL XL LVL3 (GOWN DISPOSABLE) ×2 IMPLANT
GOWN STRL REUS W/TWL LRG LVL3 (GOWN DISPOSABLE) ×8
GOWN STRL REUS W/TWL XL LVL3 (GOWN DISPOSABLE) ×4
KIT BASIN OR (CUSTOM PROCEDURE TRAY) ×4 IMPLANT
KIT ROOM TURNOVER OR (KITS) ×4 IMPLANT
L-HOOK LAP DISP 36CM (ELECTROSURGICAL) ×4
LHOOK LAP DISP 36CM (ELECTROSURGICAL) ×2 IMPLANT
NEEDLE 22X1 1/2 (OR ONLY) (NEEDLE) ×4 IMPLANT
NS IRRIG 1000ML POUR BTL (IV SOLUTION) ×4 IMPLANT
PAD ARMBOARD 7.5X6 YLW CONV (MISCELLANEOUS) ×4 IMPLANT
PENCIL BUTTON HOLSTER BLD 10FT (ELECTRODE) ×4 IMPLANT
POUCH RETRIEVAL ECOSAC 10 (ENDOMECHANICALS) ×2 IMPLANT
POUCH RETRIEVAL ECOSAC 10MM (ENDOMECHANICALS) ×2
SCISSORS LAP 5X35 DISP (ENDOMECHANICALS) ×4 IMPLANT
SET CHOLANGIOGRAPH 5 50 .035 (SET/KITS/TRAYS/PACK) ×1 IMPLANT
SET IRRIG TUBING LAPAROSCOPIC (IRRIGATION / IRRIGATOR) ×4 IMPLANT
SLEEVE ENDOPATH XCEL 5M (ENDOMECHANICALS) ×8 IMPLANT
SPECIMEN JAR SMALL (MISCELLANEOUS) ×4 IMPLANT
SUT VIC AB 4-0 PS2 27 (SUTURE) ×4 IMPLANT
TOWEL OR 17X24 6PK STRL BLUE (TOWEL DISPOSABLE) ×4 IMPLANT
TOWEL OR 17X26 10 PK STRL BLUE (TOWEL DISPOSABLE) ×4 IMPLANT
TRAY LAPAROSCOPIC MC (CUSTOM PROCEDURE TRAY) ×4 IMPLANT
TROCAR XCEL BLUNT TIP 100MML (ENDOMECHANICALS) ×4 IMPLANT
TROCAR XCEL NON-BLD 5MMX100MML (ENDOMECHANICALS) ×4 IMPLANT
TUBING INSUFFLATION (TUBING) ×4 IMPLANT

## 2016-08-26 NOTE — Progress Notes (Signed)
PT Cancellation Note  Patient Details Name: Ashley Howard MRN: [id number] DOB: [birth year]   Cancelled Treatment:    Reason Eval/Treat Not Completed: Patient at procedure or test/unavailable (Pt in OR.  Will check back tomorrow.  Thanks.)   Denice Paradise 08/26/2016, 11:59 AM [facility] [phone] (pager)

## 2016-08-26 NOTE — Transfer of Care (Signed)
Immediate Anesthesia Transfer of Care Note  Patient: Ashley Howard  Procedure(s) Performed: Procedure(s): LAPAROSCOPIC CHOLECYSTECTOMY (N/A) LAPAROSCOPIC LYSIS OF ADHESIONS FOR 20 MINUTES (N/A)  Patient Location: PACU  Anesthesia Type:General  Level of Consciousness: awake, alert  and oriented  Airway & Oxygen Therapy: Patient Spontanous Breathing and Patient connected to nasal cannula oxygen  Post-op Assessment: Report given to RN, Post -op Vital signs reviewed and stable and Patient moving all extremities X 4  Post vital signs: Reviewed and stable  Last Vitals:  Vitals:   08/26/16 0517 08/26/16 1330  BP: 108/62 137/88  Pulse: 74 76  Resp: 17 12  Temp: 36.8 C 36.3 C    Last Pain:  Vitals:   08/26/16 1330  TempSrc:   PainSc: 0-No pain      Patients Stated Pain Goal: 0 ([id number])  Complications: No apparent anesthesia complications

## 2016-08-26 NOTE — Anesthesia Procedure Notes (Signed)
Procedure Name: Intubation Date/Time: 08/26/2016 12:03 PM Performed by: Mariea Clonts Pre-anesthesia Checklist: Patient identified, Emergency Drugs available, Suction available and Patient being monitored Patient Re-evaluated:Patient Re-evaluated prior to inductionOxygen Delivery Method: Circle System Utilized Preoxygenation: Pre-oxygenation with 100% oxygen Intubation Type: IV induction Ventilation: Mask ventilation without difficulty Laryngoscope Size: Miller and 2 Grade View: Grade II Tube type: Oral Tube size: 7.0 mm Number of attempts: 1 Airway Equipment and Method: Stylet and Oral airway Placement Confirmation: ETT inserted through vocal cords under direct vision,  positive ETCO2 and breath sounds checked- equal and bilateral Tube secured with: Tape Dental Injury: Teeth and Oropharynx as per pre-operative assessment

## 2016-08-26 NOTE — Care Management Important Message (Signed)
Important Message  Patient Details  Name: Ashley Howard MRN: [id number] Date of Birth: 19-Aug-1945   Medicare Important Message Given:  Yes    Orbie Pyo 08/26/2016, 4:48 PM

## 2016-08-26 NOTE — Anesthesia Preprocedure Evaluation (Signed)
Anesthesia Evaluation  Patient identified by MRN, date of birth, ID band Patient awake    Reviewed: Allergy & Precautions, NPO status , Patient's Chart, lab work & pertinent test results  History of Anesthesia Complications (+) PONV  Airway Mallampati: II  TM Distance: >3 FB Neck ROM: Full    Dental  (+) Dental Advisory Given, Missing, Chipped, Poor Dentition   Pulmonary former smoker,    Pulmonary exam normal breath sounds clear to auscultation       Cardiovascular negative cardio ROS Normal cardiovascular exam Rhythm:Regular Rate:Normal     Neuro/Psych negative neurological ROS  negative psych ROS   GI/Hepatic Neg liver ROS, hiatal hernia, GERD  Medicated and Controlled,  Endo/Other  negative endocrine ROS  Renal/GU negative Renal ROS  negative genitourinary   Musculoskeletal negative musculoskeletal ROS (+) Arthritis , Osteoarthritis,    Abdominal   Peds negative pediatric ROS (+)  Hematology  (+) anemia ,   Anesthesia Other Findings   Reproductive/Obstetrics negative OB ROS                             Anesthesia Physical  Anesthesia Plan  ASA: III  Anesthesia Plan: General   Post-op Pain Management:    Induction: Intravenous  Airway Management Planned: Oral ETT  Additional Equipment:   Intra-op Plan:   Post-operative Plan: Extubation in OR  Informed Consent: I have reviewed the patients History and Physical, chart, labs and discussed the procedure including the risks, benefits and alternatives for the proposed anesthesia with the patient or authorized representative who has indicated his/her understanding and acceptance.   Dental advisory given  Plan Discussed with: CRNA, Surgeon and Anesthesiologist  Anesthesia Plan Comments:         Anesthesia Quick Evaluation

## 2016-08-26 NOTE — Progress Notes (Addendum)
2 Days Post-Op  Subjective: Had BM yesterday, feels less distended, mild epigastric pain  Objective: Vital signs in last 24 hours: Temp:  [98.3 F (36.8 C)-98.4 F (36.9 C)] 98.3 F (36.8 C) (02/12 0517) Pulse Rate:  [71-78] 74 (02/12 0517) Resp:  [17-19] 17 (02/12 0517) BP: (108-114)/(60-62) 108/62 (02/12 0517) SpO2:  [98 %-99 %] 99 % (02/12 0517) Last BM Date: 08/23/16  Intake/Output from previous day: 02/11 0701 - 02/12 0700 In: 1980 [P.O.:1980] Out: -  Intake/Output this shift: No intake/output data recorded.  General appearance: alert and cooperative Resp: clear to auscultation bilaterally Cardio: regular rate and rhythm GI: soft, mod distended, no RUQ tenderness, mild epig tenderness, +BS  Lab Results:   Recent Labs  08/25/16 0308  WBC 13.8*  HGB 8.3*  HCT 26.0*  PLT 516*   BMET  Recent Labs  08/25/16 0308  NA 130*  K 4.4  CL 94*  CO2 29  GLUCOSE 109*  BUN 12  CREATININE 0.80  CALCIUM 9.2   PT/INR No results for input(s): LABPROT, INR in the last 72 hours. ABG No results for input(s): PHART, HCO3 in the last 72 hours.  Invalid input(s): PCO2, PO2  Studies/Results: Dg Ercp Biliary & Pancreatic Ducts  Result Date: 08/24/2016 CLINICAL DATA:  Cholelithiasis with biliary ductal dilatation, choledocholithiasis suspected on CT EXAM: ERCP TECHNIQUE: Multiple spot images obtained with the fluoroscopic device and submitted for interpretation post-procedure. FLUOROSCOPY TIME:  1 minutes 31 second COMPARISON:  CT 08/21/2016 FINDINGS: Two spot images document endoscopic cannulation and opacification of the CBD with advancement of a catheter into the proximal CBD. There is incomplete opacification of the intrahepatic biliary tree which appears mildly distended centrally. Cystic duct is patent. Partial opacification of the gallbladder lumen demonstrating multiple filling defects . Possible filling defects in the CBD on the second image. IMPRESSION: 1.  Cholelithiasis and possible choledocholithiasis, with endoscopic intervention. These images were submitted for radiologic interpretation only. Please see the procedural report for the amount of contrast and the fluoroscopy time utilized. Electronically Signed   By: Lucrezia Europe M.D.   On: 08/24/2016 09:27    Anti-infectives: Anti-infectives    Start     Dose/Rate Route Frequency Ordered Stop   08/26/16 0600  ceFAZolin (ANCEF) IVPB 2g/100 mL premix     2 g 200 mL/hr over 30 Minutes Intravenous On call to O.R. 08/25/16 1226 08/27/16 0559      Assessment/Plan: SBO - resolved Cholelithiasis with choledocholithiasis - S/P ERCP 2/10 by ADr. Ardis Hughs. For laparoscopic cholecystectomy today. Procedure, risks, and benefits again discussed and she agrees. I discussed the expected post-op course. Check CMET now pre-op.  LOS: 5 days    Olivier Frayre E 08/26/2016

## 2016-08-26 NOTE — Progress Notes (Signed)
Pt returned to 6N25 via bed from PACU.  Pt AAO X4 but drowsy.  Pt on 2L O2 via [place].  Pt has 18G to Rt FA with fluids infusing and 20G to Lt FA SL. Pt has abd lap sites X 4 with skin glue.  SCDs in place.  Pt due to void.  Will continue to monitor pt.

## 2016-08-26 NOTE — Progress Notes (Signed)
PROGRESS NOTE        PATIENT DETAILS Name: Ashley Howard Age: 71 y.o. Sex: female Date of Birth: 1946/01/07 Admit Date: 08/21/2016 Admitting Physician Waldemar Dickens, MD [id number] [name], MD  Brief Narrative: Patient is a 71 y.o. female with prior history of SBO (most recently in January 2018), dyslipidemia, GERD admitted on 2/7 with abdominal pain. Subsequently found to have a small bowel obstruction and a choledocholithiasis. See below for further details.  Subjective: Denies any abdominal pain. Had numerous bowel movements as to the evening. Anxious about upcoming surgery today.  Assessment/Plan: Small bowel obstruction: Resolved with supportive measures. NG tube has been removed. Abdomen remained soft and nontender. Diet advanced to a regular diet. Gen. surgery continues to follow.   Choledocholithiasis: Underwent ERCP on 2/10-awaiting general surgery planning on laparoscopic cholecystectomy on 2/12.  GERD: Continue Pepcid.  Anemia: Suspect hemoglobin of 11.0 on admission was due to hemoconcentration, recent hemoglobin appears to be around 9. Slight drop in hemoglobin likely due to IV fluid dilution. Hemoglobin appears stable-follow  Urinary incontinence: s/p bladder surgery, continue ditropan   HLD: continue statin   CHronic Neuropathic pain: resume neurontin  Protein-calorie malnutrition, severe: Start supplements   DVT Prophylaxis: SCD's in anticipation of ERCP/cholecystectomy  Code Status: Full code   Family Communication: None at bedside  Disposition Plan: Remain inpatient-back to SNF sometime in the next 2-3 days depending on how she does post cholecystectomy  Antimicrobial agents: Anti-infectives    Start     Dose/Rate Route Frequency Ordered Stop   08/26/16 0600  ceFAZolin (ANCEF) IVPB 2g/100 mL premix     2 g 200 mL/hr over 30 Minutes Intravenous On call to O.R. 08/25/16 1226 08/27/16 0559      Procedures: None  CONSULTS:  GI and general surgery  Time spent: 25- minutes-Greater than 50% of this time was spent in counseling, explanation of diagnosis, planning of further management, and coordination of care.  MEDICATIONS: Scheduled Meds: .  ceFAZolin (ANCEF) IV  2 g Intravenous On Call to OR  . Chlorhexidine Gluconate Cloth  6 each Topical Once  . [MAR Hold] famotidine  20 mg Oral Daily  . [MAR Hold] feeding supplement (ENSURE ENLIVE)  237 mL Oral TID BM  . [MAR Hold] gabapentin  300 mg Oral TID  . [MAR Hold] oxybutynin  5 mg Oral BID  . [MAR Hold] pravastatin  20 mg Oral q1800  . scopolamine  1 patch Transdermal Q72H   Continuous Infusions: . lactated ringers 10 mL/hr at 08/26/16 1056   PRN Meds:.[MAR Hold] acetaminophen **OR** [MAR Hold] acetaminophen, [MAR Hold] albuterol, [MAR Hold]  morphine injection, [MAR Hold] ondansetron **OR** [MAR Hold] ondansetron (ZOFRAN) IV, [MAR Hold] simethicone   PHYSICAL EXAM: Vital signs: Vitals:   08/25/16 0503 08/25/16 1551 08/25/16 2030 08/26/16 0517  BP: 115/63 109/60 114/61 108/62  Pulse: 63 78 71 74  Resp: 17 18 19 17   Temp: 99.3 F (37.4 C) 98.3 F (36.8 C) 98.4 F (36.9 C) 98.3 F (36.8 C)  TempSrc: Oral Oral Oral Oral  SpO2: 100% 98% 98% 99%  Weight:      Height:       Filed Weights   08/22/16 0700 08/24/16 0718  Weight: 50 kg (110 lb 3.7 oz) 49.9 kg (110 lb)   Body mass index is 22.22 kg/m.   General appearance :Awake,  alert, not in any distress. Speech Clear.  Eyes:, pupils equally reactive to light and accomodation,no scleral icterus. HEENT: Atraumatic and Normocephalic Neck: supple, no JVD.  Resp:Good air entry bilaterally, no added sounds  CVS: S1 S2 regular, no murmurs.  GI: Bowel sounds +, Non tender and not distended with no gaurding, rigidity or rebound.No organomegaly Extremities: B/L Lower Ext shows no edema, both legs are warm to touch Neurology:  speech clear,Non focal, sensation is grossly  intact. Psychiatric: Normal judgment and insight. Alert and oriented x 3. Normal mood. Musculoskeletal:No digital cyanosis Skin:No Rash, warm and dry Wounds:N/A  I have personally reviewed following labs and imaging studies  LABORATORY DATA: CBC:  Recent Labs Lab 08/21/16 1005 08/22/16 0527 08/23/16 0554 08/25/16 0308  WBC 15.0* 9.4 7.3 13.8*  HGB 11.0* 8.3* 8.6* 8.3*  HCT 33.8* 25.5* 26.9* 26.0*  MCV 95.8 97.0 98.5 97.0  PLT 702* 561* 582* 516*    Basic Metabolic Panel:  Recent Labs Lab 08/21/16 1005 08/22/16 0527 08/23/16 0554 08/25/16 0308 08/26/16 0811  NA 133* 138 136 130* 133*  K 4.7 4.1 3.5 4.4 4.3  CL 95* 104 101 94* 93*  CO2 27 25 26 29 30   GLUCOSE 118* 92 86 109* 104*  BUN 13 15 8 12 14   CREATININE 0.77 0.72 0.65 0.80 0.69  CALCIUM 10.4* 8.7* 8.9 9.2 9.5    GFR: Estimated Creatinine Clearance: 43.4 mL/min (by C-G formula based on SCr of 0.69 mg/dL).  Liver Function Tests:  Recent Labs Lab 08/21/16 1005 08/22/16 0527 08/23/16 0554 08/26/16 0811  AST 26 17 20 20   ALT 19 14 11* 12*  ALKPHOS 97 69 64 86  BILITOT 0.4 0.2* 0.8 0.5  PROT 7.5 5.6* 5.6* 6.6  ALBUMIN 3.1* 2.2* 2.3* 2.9*    Recent Labs Lab 08/21/16 1005  LIPASE 22   No results for input(s): AMMONIA in the last 168 hours.  Coagulation Profile:  Recent Labs Lab 08/21/16 1732  INR 1.07    Cardiac Enzymes: No results for input(s): CKTOTAL, CKMB, CKMBINDEX, TROPONINI in the last 168 hours.  BNP (last 3 results) No results for input(s): PROBNP in the last 8760 hours.  HbA1C: No results for input(s): HGBA1C in the last 72 hours.  CBG:  Recent Labs Lab 08/25/16 0036 08/25/16 0744 08/25/16 1650 08/26/16 0101 08/26/16 0753  GLUCAP 142* 98 99 103* 106*    Lipid Profile: No results for input(s): CHOL, HDL, LDLCALC, TRIG, CHOLHDL, LDLDIRECT in the last 72 hours.  Thyroid Function Tests: No results for input(s): TSH, T4TOTAL, FREET4, T3FREE, THYROIDAB in the last  72 hours.  Anemia Panel: No results for input(s): VITAMINB12, FOLATE, FERRITIN, TIBC, IRON, RETICCTPCT in the last 72 hours.  Urine analysis:    Component Value Date/Time   COLORURINE YELLOW 08/21/2016 1650   APPEARANCEUR CLOUDY (A) 08/21/2016 1650   LABSPEC >1.046 (H) 08/21/2016 1650   PHURINE 6.0 08/21/2016 1650   GLUCOSEU NEGATIVE 08/21/2016 1650   HGBUR NEGATIVE 08/21/2016 1650   BILIRUBINUR NEGATIVE 08/21/2016 1650   KETONESUR NEGATIVE 08/21/2016 1650   PROTEINUR NEGATIVE 08/21/2016 1650   UROBILINOGEN 0.2 06/27/2016 1216   NITRITE NEGATIVE 08/21/2016 1650   LEUKOCYTESUR NEGATIVE 08/21/2016 1650    Sepsis Labs: Lactic Acid, Venous    Component Value Date/Time   LATICACIDVEN 0.58 08/21/2016 1604    MICROBIOLOGY: Recent Results (from the past 240 hour(s))  Surgical PCR screen     Status: None   Collection Time: 08/25/16  1:00 PM  Result Value Ref  Range Status   MRSA, PCR NEGATIVE NEGATIVE Final   Staphylococcus aureus NEGATIVE NEGATIVE Final    Comment:        The Xpert SA Assay (FDA approved for NASAL specimens in patients over 54 years of age), is one component of a comprehensive surveillance program.  Test performance has been validated by [facility] for patients greater than or equal to 81 year old. It is not intended to diagnose infection nor to guide or monitor treatment.     RADIOLOGY STUDIES/RESULTS: Ct Abdomen Pelvis W Contrast  Result Date: 08/21/2016 CLINICAL DATA:  Abdominal pain and distention.  Constipation. EXAM: CT ABDOMEN AND PELVIS WITH CONTRAST TECHNIQUE: Multidetector CT imaging of the abdomen and pelvis was performed using the standard protocol following bolus administration of intravenous contrast. CONTRAST:  13mL ISOVUE-300 IOPAMIDOL (ISOVUE-300) INJECTION 61% COMPARISON:  07/16/2016 FINDINGS: Lower chest: Mild atelectasis in both lower lobes. No pleural or pericardial fluid. Small hiatal hernia. Hepatobiliary: No liver parenchymal  abnormality is seen. There is intra and extrahepatic biliary ductal dilatation. There are multiple stones in the gallbladder. There appears to be a filling defect in the distal common duct, non calcified, most consistent with a passing stone. Pancreas: Normal Spleen: Normal Adrenals/Urinary Tract: Normal appearance of the kidneys except for vascular calcification. Adrenal glands are normal. Stomach/Bowel: Markedly dilated fluid filled loops of small intestine consistent with small bowel obstruction. The colon is essentially collapsed. Few loops of collapsed ileum are noted. Vascular/Lymphatic: Aortic atherosclerosis. No aneurysm. IVC is normal. Reproductive: Previous hysterectomy.  No pelvic mass. Other: Small amount of free fluid.  No free air. Musculoskeletal: Curvature in chronic degenerative change of the spine. Superior endplate fracture of L2 is chronic. IMPRESSION: High-grade small bowel obstruction. Small amount of free fluid. No free air. Chololithiasis. Intra and extra hepatic biliary ductal dilatation. Probable choledocholithiasis with a stone or stones in the distal duct. Electronically Signed   By: Nelson Chimes M.D.   On: 08/21/2016 13:59   Dg Chest Port 1 View  Result Date: 08/21/2016 CLINICAL DATA:  Preoperative respiratory evaluation. EXAM: PORTABLE CHEST 1 VIEW COMPARISON:  08/21/2016, earlier the same day. FINDINGS: The lungs are clear wiithout focal pneumonia, edema, pneumothorax or pleural effusion. The cardiopericardial silhouette is within normal limits for size. NG tube tip is in the proximal stomach with proximal port of the NG tube in the distal esophagus. Bones are diffusely demineralized. IMPRESSION: 1. No acute cardiopulmonary findings. 2. The tip of the NG tube is just barely into the stomach with proximal port in the esophagus. Electronically Signed   By: Misty Stanley M.D.   On: 08/21/2016 18:05   Dg Ercp Biliary & Pancreatic Ducts  Result Date: 08/24/2016 CLINICAL DATA:   Cholelithiasis with biliary ductal dilatation, choledocholithiasis suspected on CT EXAM: ERCP TECHNIQUE: Multiple spot images obtained with the fluoroscopic device and submitted for interpretation post-procedure. FLUOROSCOPY TIME:  1 minutes 31 second COMPARISON:  CT 08/21/2016 FINDINGS: Two spot images document endoscopic cannulation and opacification of the CBD with advancement of a catheter into the proximal CBD. There is incomplete opacification of the intrahepatic biliary tree which appears mildly distended centrally. Cystic duct is patent. Partial opacification of the gallbladder lumen demonstrating multiple filling defects . Possible filling defects in the CBD on the second image. IMPRESSION: 1. Cholelithiasis and possible choledocholithiasis, with endoscopic intervention. These images were submitted for radiologic interpretation only. Please see the procedural report for the amount of contrast and the fluoroscopy time utilized. Electronically Signed   By:  Lucrezia Europe M.D.   On: 08/24/2016 09:27   Dg Abdomen Acute W/chest  Result Date: 08/21/2016 CLINICAL DATA:  Pain and abdominal distention for 2 days with nausea. EXAM: DG ABDOMEN ACUTE W/ 1V CHEST COMPARISON:  Chest radiograph 07/19/2016. FINDINGS: Frontal view of the chest shows midline trachea and normal heart size. Thoracic aorta is calcified. Biapical pleuroparenchymal scarring. No airspace consolidation or pleural fluid. There is marked gaseous distention of colon with a fair amount of stool and air-fluid levels. Gas is seen in the rectosigmoid colon. There may be bowel gas extending into the right inguinal canal. No definite small bowel dilatation. Dextroconvex scoliosis, centered near the thoracolumbar junction, with degenerative changes. IMPRESSION: 1. Bowel gas pattern is indicative of a colonic ileus. Distal colorectal obstruction cannot be excluded. 2. There may be bowel gas extending into the right inguinal canal. Difficult to exclude a  hernia. 3. CT abdomen pelvis with contrast may be helpful in further evaluation, as clinically indicated. Electronically Signed   By: Lorin Picket M.D.   On: 08/21/2016 13:34   Dg Abd Portable 1v-small Bowel Obstruction Protocol-initial, 8 Hr Delay  Result Date: 08/22/2016 CLINICAL DATA:  Small bowel obstruction.  8 hour delayed film. EXAM: PORTABLE ABDOMEN - 1 VIEW COMPARISON:  08/21/2016 FINDINGS: Enteric tube tip is in the left upper quadrant consistent with location in the stomach. The tube is kinked at the proximal side hole. Positioning is not significantly changed since prior study. Contrast material is demonstrated throughout the colon and into the rectum. Presence of contrast material in the colon and mild gaseous distention of small bowel suggests partial obstruction or ileus. IMPRESSION: Contrast material demonstrated throughout the colon. Electronically Signed   By: Lucienne Capers M.D.   On: 08/22/2016 06:24   Dg Abd Portable 1v  Result Date: 08/21/2016 CLINICAL DATA:  Nasogastric tube placement. EXAM: PORTABLE ABDOMEN - 1 VIEW COMPARISON:  Radiographs of same day. FINDINGS: Distal tip of nasogastric tube is seen in proximal stomach. Dilated small bowel loops are again noted. IMPRESSION: Distal tip of nasogastric tube is seen in expected position of proximal stomach. Dilated small bowel loops are noted concerning for distal small bowel obstruction. Electronically Signed   By: Marijo Conception, M.D.   On: 08/21/2016 20:28   Dg Abd Portable 1 View  Result Date: 08/21/2016 CLINICAL DATA:  71 year old female enteric tube placement. Initial encounter. EXAM: PORTABLE ABDOMEN - 1 VIEW COMPARISON:  CT Abdomen and Pelvis 1342 hours today. FINDINGS: Portable AP supine view at 1646 hours. Enteric tube courses to the stomach. The side hole is in the distal thoracic esophagus. Negative lung bases. Small volume of excreted IV contrast in the renal collecting systems stable bowel gas pattern. IMPRESSION:  1. Enteric tube side hole in the distal thoracic esophagus. Advance 8 cm to ensure side hole placement within the stomach. 2. Stable bowel gas pattern from the CT Abdomen and Pelvis today. Electronically Signed   By: Genevie Ann M.D.   On: 08/21/2016 16:57     LOS: 5 days   Oren Binet, MD  [facility] Pager:[phone]  If 7PM-7AM, please contact night-coverage [email or web address] Password [facility] 08/26/2016, 12:15 PM

## 2016-08-26 NOTE — Op Note (Signed)
08/21/2016 - 08/26/2016  1:10 PM  PATIENT:  Ashley Howard  71 y.o. female  PRE-OPERATIVE DIAGNOSIS:  Choledocholithiasis  POST-OPERATIVE DIAGNOSIS:  Choledocholithiasis, intra-abdominal adhesions  PROCEDURE:  Procedure(s): LAPAROSCOPIC CHOLECYSTECTOMY LAPAROSCOPIC LYSIS OF ADHESIONS FOR 20 MINUTES  SURGEON:  Surgeon(s): Ashley Skeans, MD  ASSISTANTS: Sharyn Dross, RNFA   ANESTHESIA:   local and general  EBL:  Total I/O In: 0  Out: 15 [Blood:15]  BLOOD ADMINISTERED:none  DRAINS: none   SPECIMEN:  Excision  DISPOSITION OF SPECIMEN:  PATHOLOGY  COUNTS:  YES  DICTATION: .Dragon Dictation Findings: Several large intra-abdominal adhesions, evidence of chronic inflammation of the gallbladder  Procedure in detail: Ashley Howard presents for cholecystectomy. She was identified in the preop holding area. She received intravenous antibiotics. She was brought to the operating room and general endotracheal anesthesia was administered by the anesthesia staff. Her abdomen was prepped and draped in sterile fashion. Time out procedure was performed.The infraumbilical region was infiltrated with local. Infraumbilical incision was made. Subcutaneous tissues were dissected down revealing the anterior fascia. This was divided sharply along the midline. Peritoneal cavity was entered under direct vision without complication. A 0 Vicryl pursestring was placed around the fascial opening. Hassan trocar was inserted into the abdomen. The abdomen was insufflated with carbon dioxide in standard fashion. Under direct vision a 5 mm epigastric port was placed. Local was used at the port site. There was a large filmy adhesion and between the anterior abdominal wall and the gallbladder. It was also adherent to some of the bowel. This adhesion was lysed carefully under direct vision. Further adhesions of omentum to the anterior abdominal wall and small bowel in the upper abdomen were also taken down. Total adhesiolysis  was 20 minutes. There were no enterotomies. I felt this was important due to her recent small bowel obstruction. Next, the attention was directed back to the gallbladder. The dome was retracted superior medially. The infundibulum was retracted inferolaterally. There were a lot of filmy omental adhesions to the gallbladder as well. These were swept down and dissection began laterally and progressed medially identifying the cystic duct. Dissection continued until we had a critical view between the cystic duct, the infundibulum and the liver. She had an ERCP over the weekend so we did not do a cholangiogram. 3 clips were placed on the proximal portion of the cystic duct, one was placed distally and it was divided. Cystic artery was then identified and was clipped twice proximally and divided distally with cautery. The gallbladder was taken off the liver bed using Bovie cautery and achieving excellent hemostasis along the way. The gallbladder was placed in a bag and removed from the abdomen. It was sent to pathology. Liver bed was then cauterized getting excellent hemostasis. Clips remain in good position. There was no further bleeding. Irrigation fluid returned clear. The liver bed was dry. Ports were removed under direct vision. Pneumoperitoneum was released. Infraumbilical fascia was closed by tying the pursestring. The wounds were irrigated and the skin of each was closed with running 4 Vicryl subcuticular followed by Dermabond. All counts were correct. She tolerated procedure well without apparent complication was taken to recovery in stable condition.  PATIENT DISPOSITION:  PACU - hemodynamically stable.   Delay start of Pharmacological VTE agent (>24hrs) due to surgical blood loss or risk of bleeding:  no  Ashley Skeans, MD, MPH, FACS Pager: [phone]  2/12/20181:10 PM

## 2016-08-27 ENCOUNTER — Encounter (HOSPITAL_COMMUNITY): Payer: Self-pay | Admitting: General Surgery

## 2016-08-27 LAB — CBC
HCT: 26.7 % — ABNORMAL LOW (ref 36.0–46.0)
Hemoglobin: 8.6 g/dL — ABNORMAL LOW (ref 12.0–15.0)
MCH: 31.2 pg (ref 26.0–34.0)
MCHC: 32.2 g/dL (ref 30.0–36.0)
MCV: 96.7 fL (ref 78.0–100.0)
Platelets: 503 K/uL — ABNORMAL HIGH (ref 150–400)
RBC: 2.76 MIL/uL — ABNORMAL LOW (ref 3.87–5.11)
RDW: 15.1 % (ref 11.5–15.5)
WBC: 12.4 K/uL — ABNORMAL HIGH (ref 4.0–10.5)

## 2016-08-27 LAB — URINALYSIS, ROUTINE W REFLEX MICROSCOPIC
Bilirubin Urine: NEGATIVE
Glucose, UA: NEGATIVE mg/dL
Hgb urine dipstick: NEGATIVE
Ketones, ur: NEGATIVE mg/dL
Leukocytes, UA: NEGATIVE
Nitrite: NEGATIVE
Protein, ur: NEGATIVE mg/dL
Specific Gravity, Urine: 1.004 — ABNORMAL LOW (ref 1.005–1.030)
pH: 7 (ref 5.0–8.0)

## 2016-08-27 LAB — GLUCOSE, CAPILLARY
GLUCOSE-CAPILLARY: 108 mg/dL — AB (ref 65–99)
GLUCOSE-CAPILLARY: 109 mg/dL — AB (ref 65–99)
Glucose-Capillary: 113 mg/dL — ABNORMAL HIGH (ref 65–99)
Glucose-Capillary: 97 mg/dL (ref 65–99)
Glucose-Capillary: 149 mg/dL — ABNORMAL HIGH (ref 65–99)

## 2016-08-27 LAB — COMPREHENSIVE METABOLIC PANEL
ALBUMIN: 2.7 g/dL — AB (ref 3.5–5.0)
ALT: 14 U/L (ref 14–54)
AST: 30 U/L (ref 15–41)
Alkaline Phosphatase: 63 U/L (ref 38–126)
Anion gap: 12 (ref 5–15)
BILIRUBIN TOTAL: 0.5 mg/dL (ref 0.3–1.2)
BUN: 12 mg/dL (ref 6–20)
CO2: 27 mmol/L (ref 22–32)
CREATININE: 0.8 mg/dL (ref 0.44–1.00)
Calcium: 9.3 mg/dL (ref 8.9–10.3)
Chloride: 89 mmol/L — ABNORMAL LOW (ref 101–111)
GFR calc Af Amer: >60 mL/min (ref >60)
GLUCOSE: 123 mg/dL — AB (ref 65–99)
Potassium: 4.5 mmol/L (ref 3.5–5.1)
Sodium: 128 mmol/L — ABNORMAL LOW (ref 135–145)
Total Protein: 6 g/dL — ABNORMAL LOW (ref 6.5–8.1)

## 2016-08-27 MED ORDER — FUROSEMIDE 10 MG/ML IJ SOLN
20.0000 mg | Freq: Once | INTRAMUSCULAR | Status: AC
  Administered 2016-08-27: 20 mg via INTRAVENOUS
  Filled 2016-08-27: qty 2

## 2016-08-27 NOTE — Progress Notes (Signed)
Nutrition Follow-up  DOCUMENTATION CODES:   Severe malnutrition in context of acute illness/injury  INTERVENTION:   -Continue Ensure Enlive po TID, each supplement provides 350 kcal and 20 grams of protein  NUTRITION DIAGNOSIS:   Malnutrition related to acute illness as evidenced by severe depletion of muscle mass, severe depletion of body fat.  Ongoing  GOAL:   Patient will meet greater than or equal to 90% of their needs  Progressing  MONITOR:   PO intake, Supplement acceptance, Labs, Weight trends, Skin, I & O's  REASON FOR ASSESSMENT:   Malnutrition Screening Tool    ASSESSMENT:   71 yo female presenting with 2 day history of acute onset abdominal pain and distension. PMHx significant for hiatal hernia, urinary incontinense, scoliosis, SBO, appendectomy, hysterectomy, and bladder surgery.  2/8- NGT d/c 2/10- s/p ERCP, which revealed mildly dilated extra hepatic biliary tree and choledocholithiasis (s/p biliary sphincterotomy and sweeping)  S/p Procedure(s) 08/26/16: LAPAROSCOPIC CHOLECYSTECTOMY LAPAROSCOPIC LYSIS OF ADHESIONS FOR 20 MINUTES  Pt ambulating around room and looking out the window at time of visit. Pt is tolerating regular diet. Per meal completion records, PO: 75-100%. Ensure Enlive supplements ordered on 08/23/16; pt accepting well. Will continue supplements to optimize nutritional status given hx of poor PO intake PTA and malnutrition.   Per MD notes, awaiting PT evaluation to determine discharge disposition (ALF vs SNF).   Labs reviewed: Na: 128 (on IV supplementation), CBGS: 108-192.   Diet Order:  Diet regular Room service appropriate? Yes; Fluid consistency: Thin  Skin:  Reviewed, no issues  Last BM:  08/25/16  Height:   Ht Readings from Last 1 Encounters:  08/24/16 4\' 11"  (1.499 m)    Weight:   Wt Readings from Last 1 Encounters:  08/24/16 110 lb (49.9 kg)    Ideal Body Weight:  43.9 kg  BMI:  Body mass index is 22.22  kg/m.  Estimated Nutritional Needs:   Kcal:  1500-1700 kcal (30 kcal/kg)  Protein:  75-85 grams (1.5 g/kg)  Fluid:  1.5-1.7 L/day  EDUCATION NEEDS:   No education needs identified at this time  Taygan Connell A. Jimmye Norman, RD, LDN, CDE Pager: [phone] After hours Pager: [phone]

## 2016-08-27 NOTE — Progress Notes (Signed)
PROGRESS NOTE        PATIENT DETAILS Name: Ashley Howard Age: 71 y.o. Sex: female Date of Birth: [birth year] Admit Date: 08/21/2016 Admitting Physician Waldemar Dickens, MD [id number] [name], MD  Brief Narrative: Patient is a 71 y.o. female with prior history of SBO (most recently in January 2018), dyslipidemia, and GERD. She was admitted on 2/7 with abdominal pain and found to have SBO and choledocholithiasis. SBO resolved with supportive measures. She had laparoscopic cholecystectomy on 2/12 and is doing well post-op.   Subjective: She is feeling good today - sitting up and eating. She has not yet had bowel movement.   Assessment/Plan: Small bowel obstruction: resolved with supportive measures. She is doing well with regular diet.   Choledocholithiasis: Underwent laparoscopic cholecystectomy on 2/12. She is doing well post-op and is tolerating a regular diet. She has not yet had bowel movement. She will have PT evaluation to assess discharge to assisted living facility vs. SNF.   GERD: Continue Pepcid.  Anemia: Suspect hemoglobin of 11.0 on admission was due to hemoconcentration, recent hemoglobin appears to be around 9. Slight drop in hemoglobin likely due to IV fluid dilution. Hemoglobin appears stable- continue to follow.  Urinary incontinence: s/p bladder surgery, continue ditropan   HLD: continue statin   Chronic Neuropathic pain: resume neurontin  Protein-calorie malnutrition, severe: Continue supplements   DVT Prophylaxis: None  Code Status: Full code   Family Communication: None  Disposition Plan: Remain inpatient-but will plan on assisted living facility vs SNF on discharge, awaiting PT evaluation-likely d/c on 2/14  Antimicrobial agents: Anti-infectives    Start     Dose/Rate Route Frequency Ordered Stop   08/26/16 0600  ceFAZolin (ANCEF) IVPB 2g/100 mL premix     2 g 200 mL/hr over 30 Minutes Intravenous On call to O.R.  08/25/16 1226 08/26/16 1156      Procedures: None  CONSULTS:  None  Time spent: 25 minutes-Greater than 50% of this time was spent in counseling, explanation of diagnosis, planning of further management, and coordination of care.  MEDICATIONS: Scheduled Meds: . Chlorhexidine Gluconate Cloth  6 each Topical Once  . feeding supplement (ENSURE ENLIVE)  237 mL Oral TID BM  . furosemide  20 mg Intravenous Once  . gabapentin  300 mg Oral TID  . oxybutynin  5 mg Oral BID  . pravastatin  20 mg Oral q1800  . scopolamine  1 patch Transdermal Q72H   Continuous Infusions: . lactated ringers 10 mL/hr at 08/27/16 0234   PRN Meds:.acetaminophen **OR** acetaminophen, albuterol, morphine injection, ondansetron **OR** ondansetron (ZOFRAN) IV, simethicone, traMADol   PHYSICAL EXAM: Vital signs: Vitals:   08/27/16 0220 08/27/16 0419 08/27/16 0553 08/27/16 1006  BP: 107/88 120/77  133/77  Pulse: 92 90  78  Resp: 19 18  18   Temp: 99.3 F (37.4 C) (!) 100.7 F (38.2 C) 99.5 F (37.5 C) 98.2 F (36.8 C)  TempSrc: Oral Oral Oral Oral  SpO2: 100% 98%  100%  Weight:      Height:       Filed Weights   08/22/16 0700 08/24/16 0718  Weight: 50 kg (110 lb 3.7 oz) 49.9 kg (110 lb)   Body mass index is 22.22 kg/m.   General appearance :Awake, alert, not in any distress. Speech Clear. Not toxic Looking Eyes:, pupils equally reactive to light and  accomodation,no scleral icterus.Pink conjunctiva HEENT: Atraumatic and Normocephalic Neck: supple, no JVD. No cervical lymphadenopathy. No thyromegaly Resp:Good air entry bilaterally, no added sounds  CVS: S1 S2 regular, no murmurs.  GI: Bowel sounds present. Not distended with no gaurding, rigidity or rebound. No organomegaly. Laparoscopic incisions appear to be healing well with minimal tenderness. Extremities: B/L Lower Ext shows no edema, both legs are warm to touch Neurology:  speech clear,Non focal, sensation is grossly intact. Psychiatric:  Normal judgment and insight. Alert and oriented x 3. Normal mood. Musculoskeletal:No digital cyanosis Skin:No Rash, warm and dry Wounds:N/A  I have personally reviewed following labs and imaging studies  LABORATORY DATA: CBC:  Recent Labs Lab 08/21/16 1005 08/22/16 0527 08/23/16 0554 08/25/16 0308 08/27/16 0547  WBC 15.0* 9.4 7.3 13.8* 12.4*  HGB 11.0* 8.3* 8.6* 8.3* 8.6*  HCT 33.8* 25.5* 26.9* 26.0* 26.7*  MCV 95.8 97.0 98.5 97.0 96.7  PLT 702* 561* 582* 516* 503*    Basic Metabolic Panel:  Recent Labs Lab 08/22/16 0527 08/23/16 0554 08/25/16 0308 08/26/16 0811 08/27/16 0547  NA 138 136 130* 133* 128*  K 4.1 3.5 4.4 4.3 4.5  CL 104 101 94* 93* 89*  CO2 25 26 29 30 27   GLUCOSE 92 86 109* 104* 123*  BUN 15 8 12 14 12   CREATININE 0.72 0.65 0.80 0.69 0.80  CALCIUM 8.7* 8.9 9.2 9.5 9.3    GFR: Estimated Creatinine Clearance: 43.4 mL/min (by C-G formula based on SCr of 0.8 mg/dL).  Liver Function Tests:  Recent Labs Lab 08/21/16 1005 08/22/16 0527 08/23/16 0554 08/26/16 0811 08/27/16 0547  AST 26 17 20 20 30   ALT 19 14 11* 12* 14  ALKPHOS 97 69 64 86 63  BILITOT 0.4 0.2* 0.8 0.5 0.5  PROT 7.5 5.6* 5.6* 6.6 6.0*  ALBUMIN 3.1* 2.2* 2.3* 2.9* 2.7*    Recent Labs Lab 08/21/16 1005  LIPASE 22   No results for input(s): AMMONIA in the last 168 hours.  Coagulation Profile:  Recent Labs Lab 08/21/16 1732  INR 1.07    Cardiac Enzymes: No results for input(s): CKTOTAL, CKMB, CKMBINDEX, TROPONINI in the last 168 hours.  BNP (last 3 results) No results for input(s): PROBNP in the last 8760 hours.  HbA1C: No results for input(s): HGBA1C in the last 72 hours.  CBG:  Recent Labs Lab 08/26/16 0101 08/26/16 0753 08/26/16 1733 08/27/16 0030 08/27/16 0740  GLUCAP 103* 106* 192* 108* 113*    Lipid Profile: No results for input(s): CHOL, HDL, LDLCALC, TRIG, CHOLHDL, LDLDIRECT in the last 72 hours.  Thyroid Function Tests: No results for  input(s): TSH, T4TOTAL, FREET4, T3FREE, THYROIDAB in the last 72 hours.  Anemia Panel: No results for input(s): VITAMINB12, FOLATE, FERRITIN, TIBC, IRON, RETICCTPCT in the last 72 hours.  Urine analysis:    Component Value Date/Time   COLORURINE YELLOW 08/21/2016 1650   APPEARANCEUR CLOUDY (A) 08/21/2016 1650   LABSPEC >1.046 (H) 08/21/2016 1650   PHURINE 6.0 08/21/2016 1650   GLUCOSEU NEGATIVE 08/21/2016 1650   HGBUR NEGATIVE 08/21/2016 1650   BILIRUBINUR NEGATIVE 08/21/2016 1650   KETONESUR NEGATIVE 08/21/2016 1650   PROTEINUR NEGATIVE 08/21/2016 1650   UROBILINOGEN 0.2 06/27/2016 1216   NITRITE NEGATIVE 08/21/2016 1650   LEUKOCYTESUR NEGATIVE 08/21/2016 1650    Sepsis Labs: Lactic Acid, Venous    Component Value Date/Time   LATICACIDVEN 0.58 08/21/2016 1604    MICROBIOLOGY: Recent Results (from the past 240 hour(s))  Surgical PCR screen     Status:  None   Collection Time: 08/25/16  1:00 PM  Result Value Ref Range Status   MRSA, PCR NEGATIVE NEGATIVE Final   Staphylococcus aureus NEGATIVE NEGATIVE Final    Comment:        The Xpert SA Assay (FDA approved for NASAL specimens in patients over 87 years of age), is one component of a comprehensive surveillance program.  Test performance has been validated by [facility] for patients greater than or equal to 57 year old. It is not intended to diagnose infection nor to guide or monitor treatment.     RADIOLOGY STUDIES/RESULTS: Ct Abdomen Pelvis W Contrast  Result Date: 08/21/2016 CLINICAL DATA:  Abdominal pain and distention.  Constipation. EXAM: CT ABDOMEN AND PELVIS WITH CONTRAST TECHNIQUE: Multidetector CT imaging of the abdomen and pelvis was performed using the standard protocol following bolus administration of intravenous contrast. CONTRAST:  159mL ISOVUE-300 IOPAMIDOL (ISOVUE-300) INJECTION 61% COMPARISON:  07/16/2016 FINDINGS: Lower chest: Mild atelectasis in both lower lobes. No pleural or pericardial fluid.  Small hiatal hernia. Hepatobiliary: No liver parenchymal abnormality is seen. There is intra and extrahepatic biliary ductal dilatation. There are multiple stones in the gallbladder. There appears to be a filling defect in the distal common duct, non calcified, most consistent with a passing stone. Pancreas: Normal Spleen: Normal Adrenals/Urinary Tract: Normal appearance of the kidneys except for vascular calcification. Adrenal glands are normal. Stomach/Bowel: Markedly dilated fluid filled loops of small intestine consistent with small bowel obstruction. The colon is essentially collapsed. Few loops of collapsed ileum are noted. Vascular/Lymphatic: Aortic atherosclerosis. No aneurysm. IVC is normal. Reproductive: Previous hysterectomy.  No pelvic mass. Other: Small amount of free fluid.  No free air. Musculoskeletal: Curvature in chronic degenerative change of the spine. Superior endplate fracture of L2 is chronic. IMPRESSION: High-grade small bowel obstruction. Small amount of free fluid. No free air. Chololithiasis. Intra and extra hepatic biliary ductal dilatation. Probable choledocholithiasis with a stone or stones in the distal duct. Electronically Signed   By: Nelson Chimes M.D.   On: 08/21/2016 13:59   Dg Chest Port 1 View  Result Date: 08/21/2016 CLINICAL DATA:  Preoperative respiratory evaluation. EXAM: PORTABLE CHEST 1 VIEW COMPARISON:  08/21/2016, earlier the same day. FINDINGS: The lungs are clear wiithout focal pneumonia, edema, pneumothorax or pleural effusion. The cardiopericardial silhouette is within normal limits for size. NG tube tip is in the proximal stomach with proximal port of the NG tube in the distal esophagus. Bones are diffusely demineralized. IMPRESSION: 1. No acute cardiopulmonary findings. 2. The tip of the NG tube is just barely into the stomach with proximal port in the esophagus. Electronically Signed   By: Misty Stanley M.D.   On: 08/21/2016 18:05   Dg Ercp Biliary &  Pancreatic Ducts  Result Date: 08/24/2016 CLINICAL DATA:  Cholelithiasis with biliary ductal dilatation, choledocholithiasis suspected on CT EXAM: ERCP TECHNIQUE: Multiple spot images obtained with the fluoroscopic device and submitted for interpretation post-procedure. FLUOROSCOPY TIME:  1 minutes 31 second COMPARISON:  CT 08/21/2016 FINDINGS: Two spot images document endoscopic cannulation and opacification of the CBD with advancement of a catheter into the proximal CBD. There is incomplete opacification of the intrahepatic biliary tree which appears mildly distended centrally. Cystic duct is patent. Partial opacification of the gallbladder lumen demonstrating multiple filling defects . Possible filling defects in the CBD on the second image. IMPRESSION: 1. Cholelithiasis and possible choledocholithiasis, with endoscopic intervention. These images were submitted for radiologic interpretation only. Please see the procedural report for the  amount of contrast and the fluoroscopy time utilized. Electronically Signed   By: Lucrezia Europe M.D.   On: 08/24/2016 09:27   Dg Abdomen Acute W/chest  Result Date: 08/21/2016 CLINICAL DATA:  Pain and abdominal distention for 2 days with nausea. EXAM: DG ABDOMEN ACUTE W/ 1V CHEST COMPARISON:  Chest radiograph 07/19/2016. FINDINGS: Frontal view of the chest shows midline trachea and normal heart size. Thoracic aorta is calcified. Biapical pleuroparenchymal scarring. No airspace consolidation or pleural fluid. There is marked gaseous distention of colon with a fair amount of stool and air-fluid levels. Gas is seen in the rectosigmoid colon. There may be bowel gas extending into the right inguinal canal. No definite small bowel dilatation. Dextroconvex scoliosis, centered near the thoracolumbar junction, with degenerative changes. IMPRESSION: 1. Bowel gas pattern is indicative of a colonic ileus. Distal colorectal obstruction cannot be excluded. 2. There may be bowel gas extending  into the right inguinal canal. Difficult to exclude a hernia. 3. CT abdomen pelvis with contrast may be helpful in further evaluation, as clinically indicated. Electronically Signed   By: Lorin Picket M.D.   On: 08/21/2016 13:34   Dg Abd Portable 1v-small Bowel Obstruction Protocol-initial, 8 Hr Delay  Result Date: 08/22/2016 CLINICAL DATA:  Small bowel obstruction.  8 hour delayed film. EXAM: PORTABLE ABDOMEN - 1 VIEW COMPARISON:  08/21/2016 FINDINGS: Enteric tube tip is in the left upper quadrant consistent with location in the stomach. The tube is kinked at the proximal side hole. Positioning is not significantly changed since prior study. Contrast material is demonstrated throughout the colon and into the rectum. Presence of contrast material in the colon and mild gaseous distention of small bowel suggests partial obstruction or ileus. IMPRESSION: Contrast material demonstrated throughout the colon. Electronically Signed   By: Lucienne Capers M.D.   On: 08/22/2016 06:24   Dg Abd Portable 1v  Result Date: 08/21/2016 CLINICAL DATA:  Nasogastric tube placement. EXAM: PORTABLE ABDOMEN - 1 VIEW COMPARISON:  Radiographs of same day. FINDINGS: Distal tip of nasogastric tube is seen in proximal stomach. Dilated small bowel loops are again noted. IMPRESSION: Distal tip of nasogastric tube is seen in expected position of proximal stomach. Dilated small bowel loops are noted concerning for distal small bowel obstruction. Electronically Signed   By: Marijo Conception, M.D.   On: 08/21/2016 20:28   Dg Abd Portable 1 View  Result Date: 08/21/2016 CLINICAL DATA:  70 year old female enteric tube placement. Initial encounter. EXAM: PORTABLE ABDOMEN - 1 VIEW COMPARISON:  CT Abdomen and Pelvis 1342 hours today. FINDINGS: Portable AP supine view at 1646 hours. Enteric tube courses to the stomach. The side hole is in the distal thoracic esophagus. Negative lung bases. Small volume of excreted IV contrast in the renal  collecting systems stable bowel gas pattern. IMPRESSION: 1. Enteric tube side hole in the distal thoracic esophagus. Advance 8 cm to ensure side hole placement within the stomach. 2. Stable bowel gas pattern from the CT Abdomen and Pelvis today. Electronically Signed   By: Genevie Ann M.D.   On: 08/21/2016 16:57     LOS: 6 days   Kyra Leyland, PA-S  [facility] Pager:[phone]  If 7PM-7AM, please contact night-coverage [email or web address] Password [facility] 08/27/2016, 10:34 AM  Attending MD note  Patient was seen, examined,treatment plan was discussed with the PA-S.  I have personally reviewed the clinical findings, lab, imaging studies and management of this patient in detail. I agree with the documentation, as recorded by the PA-S.  Patient doing well-no BM-but tolerating reg diet  On Exam: Gen. exam: Awake, alert, not in any distress Chest: Good air entry bilaterally, no rhonchi or rales CVS: S1-S2 regular, no murmurs Abdomen: Soft, nontender and nondistended. Incision site looks good-without erythema or dicharges Neurology: Non-focal Skin: No rash or lesions  Imp SBO-Resolved CBD stone-s/p ERCP and Cholecystectomyy  Plan Ambulate today-with PT-SNF vs ALF on discharge  Rest as above  [facility]

## 2016-08-27 NOTE — Care Management Note (Signed)
Case Management Note  Patient Details  Name: Ashley Howard MRN: [id number] Date of Birth: [birth year]  Subjective/Objective:                    Action/Plan: Awaiting PT post op eval.  Expected Discharge Date:                  Expected Discharge Plan:  Assisted Living / Rest Home  In-House Referral:  Clinical Social Work  Discharge planning Services  CM Consult  Post Acute Care Choice:  Home Health Choice offered to:     DME Arranged:    DME Agency:     HH Arranged:    [place] Agency:     Status of Service:  In process, will continue to follow  If discussed at Long Length of Stay Meetings, dates discussed:    Additional Comments:  Marilu Favre, RN 08/27/2016, 11:03 AM

## 2016-08-27 NOTE — Anesthesia Postprocedure Evaluation (Addendum)
Anesthesia Post Note  Patient: Ashley Howard  Procedure(s) Performed: Procedure(s) (LRB): LAPAROSCOPIC CHOLECYSTECTOMY (N/A) LAPAROSCOPIC LYSIS OF ADHESIONS FOR 20 MINUTES (N/A)  Patient location during evaluation: PACU Anesthesia Type: General Level of consciousness: sedated Pain management: pain level controlled Vital Signs Assessment: post-procedure vital signs reviewed and stable Respiratory status: spontaneous breathing and respiratory function stable Cardiovascular status: stable Anesthetic complications: no                   Kamaury Cutbirth DANIEL

## 2016-08-27 NOTE — Progress Notes (Signed)
Patient ID: Ashley Howard, female   DOB: 1945-12-11, 71 y.o.   MRN: [id number]  [facility] Surgery Progress Note  1 Day Post-Op  Subjective: Patient up walking around room this morning. Abdomen sore but less painful than yesterday. Tolerating small amount of regular diet. She is not passing flatus yet. Denies dysuria, but states that her urine is foul smelling and she is concerned that she may have a UTI.  Objective: Vital signs in last 24 hours: Temp:  [97.3 F (36.3 C)-100.7 F (38.2 C)] 99.5 F (37.5 C) (02/13 0553) Pulse Rate:  [62-92] 90 (02/13 0419) Resp:  [12-21] 18 (02/13 0419) BP: (107-153)/(69-88) 120/77 (02/13 0419) SpO2:  [98 %-100 %] 98 % (02/13 0419) Last BM Date: 08/25/16  Intake/Output from previous day: 02/12 0701 - 02/13 0700 In: 1518.3 [P.O.:480; I.V.:1038.3] Out: 15 [Blood:15] Intake/Output this shift: No intake/output data recorded.  PE: Gen:  Alert, NAD, pleasant Pulm:  Effort normal Abd: Soft, distended, appropriately tender, +BS, incisions C/D/I  Lab Results:   Recent Labs  08/25/16 0308 08/27/16 0547  WBC 13.8* 12.4*  HGB 8.3* 8.6*  HCT 26.0* 26.7*  PLT 516* 503*   BMET  Recent Labs  08/26/16 0811 08/27/16 0547  NA 133* 128*  K 4.3 4.5  CL 93* 89*  CO2 30 27  GLUCOSE 104* 123*  BUN 14 12  CREATININE 0.69 0.80  CALCIUM 9.5 9.3   PT/INR No results for input(s): LABPROT, INR in the last 72 hours. CMP     Component Value Date/Time   NA 128 (L) 08/27/2016 0547   K 4.5 08/27/2016 0547   CL 89 (L) 08/27/2016 0547   CO2 27 08/27/2016 0547   GLUCOSE 123 (H) 08/27/2016 0547   BUN 12 08/27/2016 0547   CREATININE 0.80 08/27/2016 0547   CALCIUM 9.3 08/27/2016 0547   PROT 6.0 (L) 08/27/2016 0547   ALBUMIN 2.7 (L) 08/27/2016 0547   AST 30 08/27/2016 0547   ALT 14 08/27/2016 0547   ALKPHOS 63 08/27/2016 0547   BILITOT 0.5 08/27/2016 0547   GFRNONAA >60 08/27/2016 0547   GFRAA >60 08/27/2016 0547   Lipase     Component  Value Date/Time   LIPASE 22 08/21/2016 1005       Studies/Results: No results found.  Anti-infectives: Anti-infectives    Start     Dose/Rate Route Frequency Ordered Stop   08/26/16 0600  ceFAZolin (ANCEF) IVPB 2g/100 mL premix     2 g 200 mL/hr over 30 Minutes Intravenous On call to O.R. 08/25/16 1226 08/26/16 1156       Assessment/Plan Choledocholithiasis, intra-abdominal adhesions LAPAROSCOPIC CHOLECYSTECTOMY, LAPAROSCOPIC LYSIS OF ADHESIONS FOR 20 MINUTES 2/12 Dr. Grandville Silos - POD 1 - WBC trending down 12.4, TMAX 100.7 over night - no flatus  ID - ancef perioperative FEN - regular diet VTE - SCDs  Plan - planning for SNF at discharge. Patient concerned about possible UTI, will check u/a. Monitor fever. Encourage mobilization.    LOS: 6 days    [name] , [facility] Surgery 08/27/2016, 8:15 AM Pager: [phone] Consults: [phone] Mon-Fri 7:00 am-4:30 pm Sat-Sun 7:00 am-11:30 am

## 2016-08-27 NOTE — Progress Notes (Addendum)
Physical Therapy Treatment Patient Details Name: Ashley Howard MRN: [id number] DOB: 1946/02/27 Today's Date: 08/27/2016    History of Present Illness Pt admitted through ED on 08/21/16 from [facility] with exacerbation of SBO and gall bladder stones. Pt was recently hospitalized in January for similar symptoms. Pt was in [place] for short term rehab. 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     PT Comments    Discussed pt with Henderson Newcomer, PT prior to session and PT goals remain appropriate. Patient confused today and therefore session limited. However, given pt's PLOF I suspect pt will progress well when confusion clears up. If pt's confusion does not clear quickly ST-SNF should be considered to maximize safety with mobility prior to d/c to ALF. PT will continue to follow acutely.   Follow Up Recommendations  Home health PT;Supervision for mobility/OOB (ALF)     Equipment Recommendations  Rolling walker with 5" wheels;3in1 (PT)    Recommendations for Other Services       Precautions / Restrictions Precautions Precautions: None Restrictions Weight Bearing Restrictions: No    Mobility  Bed Mobility               General bed mobility comments: not witnessed; upon arrival pt was up in room holding onto bedside table and recliner arm with IV pole plugged in on other side of bed and had urinated in floor   Transfers Overall transfer level: Needs assistance Equipment used: Rolling walker (2 wheeled) Transfers: Sit to/from Stand Sit to Stand: Min guard;Min assist         General transfer comment: assist to power up and to steady; pt required multimodal cues for safe hand placement and use of AD however did not follow commands   Ambulation/Gait Ambulation/Gait assistance: Mod assist Ambulation Distance (Feet):  (52ft X2) Assistive device: Rolling walker (2 wheeled) Gait  Pattern/deviations: Step-through pattern;Decreased stride length;Wide base of support Gait velocity: decreased   General Gait Details: constant verbal and tactile cues for safe use of AD; pt required assistance for management of RW and balance; LOB X1 when entering bathroom requiring assist to recover   Stairs            Wheelchair Mobility    Modified Rankin (Stroke Patients Only)       Balance                                    Cognition Arousal/Alertness: Awake/alert Behavior During Therapy: Impulsive;Restless Overall Cognitive Status: Impaired/Different from baseline Area of Impairment: Orientation;Memory;Following commands;Safety/judgement;Problem solving Orientation Level: Disoriented to;Time;Situation   Memory: Decreased short-term memory Following Commands: Follows one step commands inconsistently;Follows one step commands with increased time Safety/Judgement: Decreased awareness of safety   Problem Solving: Requires verbal cues;Requires tactile cues General Comments: pt confused and impulsive this session; when asked the date pt stated "the date. Dora the explorer. I always thought that was a good name"    Exercises      General Comments General comments (skin integrity, edema, etc.): pt incontinent of urine X3 when ambulating in room      Pertinent Vitals/Pain Pain Assessment: Faces Faces Pain Scale: Hurts even more Pain Location: abdomen with bed mobility and coughing/sneezing Pain Descriptors / Indicators: Aching;Grimacing;Guarding;Sharp Pain Intervention(s): Limited activity within patient's tolerance;Monitored during session;Premedicated before session;Repositioned    Home Living  Prior Function            PT Goals (current goals can now be found in the care plan section) Acute Rehab PT Goals Patient Stated Goal: none stated PT Goal Formulation: With patient Time For Goal Achievement:  09/01/16 Potential to Achieve Goals: Good Progress towards PT goals: Not progressing toward goals - comment    Frequency    Min 3X/week      PT Plan Current plan remains appropriate    Co-evaluation             End of Session Equipment Utilized During Treatment: Gait belt Activity Tolerance: Other (comment);Patient tolerated treatment well (limited by confusion) Patient left: in bed;with call bell/phone within reach;with bed alarm set     Time: [id number] PT Time Calculation (min) (ACUTE ONLY): 29 min  Charges:  $Gait Training: 8-22 mins $Therapeutic Activity: 8-22 mins                    G Codes:      [place]ina April, PTA Pager: [phone]   08/27/2016, 4:00 PM

## 2016-08-28 ENCOUNTER — Inpatient Hospital Stay (HOSPITAL_COMMUNITY): Payer: Medicare Other

## 2016-08-28 LAB — GLUCOSE, CAPILLARY
GLUCOSE-CAPILLARY: 120 mg/dL — AB (ref 65–99)
Glucose-Capillary: 106 mg/dL — ABNORMAL HIGH (ref 65–99)

## 2016-08-28 MED ORDER — POLYETHYLENE GLYCOL 3350 17 G PO PACK
17.0000 g | PACK | Freq: Every day | ORAL | Status: DC
  Administered 2016-08-28 – 2016-08-29 (×2): 17 g via ORAL
  Filled 2016-08-28 (×2): qty 1

## 2016-08-28 NOTE — Progress Notes (Addendum)
PROGRESS NOTE        PATIENT DETAILS Name: Ashley Howard Age: 71 y.o. Sex: female Date of Birth: 1946-06-22 Admit Date: 08/21/2016 Admitting Physician Waldemar Dickens, MD [id number] [name], MD  Brief Narrative: Patient is a 71 y.o. female with prior history of SBO (most recently in January 2018), dyslipidemia, and GERD. She was admitted on 2/7 with abdominal pain and found to have SBO and choledocholithiasis. SBO resolved with supportive measures. She had laparoscopic cholecystectomy on 2/12 and is doing well post-op.   Subjective: Abdomen appears more distended today-no BM or flatus since cholecystectomy.  Assessment/Plan: Postoperative ileus: Patient underwent laparoscopic cholecystectomy on 2/12-she appears to have a slightly more distended abdomen compared to the past few days. No bowel sound heard on exam. Abdominal x-ray confirms ileus. Gen. surgery following-plans are to continue supportive measures.  Choledocholithiasis s/p laparoscopic cholecystectomy on 2/12: Unfortunately has developed ileus postoperatively. When ileus has resolved, plans are to discharge to ALF.  Small bowel obstruction: resolved with supportive measures.   GERD: Continue Pepcid.  Anemia: Suspect hemoglobin of 11.0 on admission was due to hemoconcentration, recent hemoglobin appears to be around 9. Slight drop in hemoglobin likely due to IV fluid dilution. Hemoglobin appears stable- continue to follow.  Urinary incontinence: s/p bladder surgery, continue ditropan   HLD: continue statin   Chronic Neuropathic pain: resume neurontin  Protein-calorie malnutrition, severe: Continue supplements   DVT Prophylaxis: None  Code Status: Full code   Family Communication: None  Disposition Plan: Remain inpatient-ALF when ileus resolves-hopefully soon over the next few days  Antimicrobial agents: Anti-infectives    Start     Dose/Rate Route Frequency Ordered Stop   08/26/16 0600  ceFAZolin (ANCEF) IVPB 2g/100 mL premix     2 g 200 mL/hr over 30 Minutes Intravenous On call to O.R. 08/25/16 1226 08/26/16 1156      Procedures: None  CONSULTS:  None  Time spent: 25 minutes-Greater than 50% of this time was spent in counseling, explanation of diagnosis, planning of further management, and coordination of care.  MEDICATIONS: Scheduled Meds: . Chlorhexidine Gluconate Cloth  6 each Topical Once  . feeding supplement (ENSURE ENLIVE)  237 mL Oral TID BM  . gabapentin  300 mg Oral TID  . oxybutynin  5 mg Oral BID  . pravastatin  20 mg Oral q1800  . scopolamine  1 patch Transdermal Q72H   Continuous Infusions: . lactated ringers 10 mL/hr at 08/27/16 0234   PRN Meds:.acetaminophen **OR** acetaminophen, albuterol, morphine injection, ondansetron **OR** ondansetron (ZOFRAN) IV, simethicone, traMADol   PHYSICAL EXAM: Vital signs: Vitals:   08/27/16 1454 08/27/16 2051 08/28/16 0500 08/28/16 1315  BP: 137/66 (!) 106/52 121/61 (!) 108/58  Pulse: 80 76 79 65  Resp: 18 19 19 19   Temp: 99.8 F (37.7 C) 98.2 F (36.8 C) 99.9 F (37.7 C) 99.4 F (37.4 C)  TempSrc: Oral Oral Oral Oral  SpO2: 100% 100% 100% 99%  Weight:      Height:       Filed Weights   08/22/16 0700 08/24/16 0718  Weight: 50 kg (110 lb 3.7 oz) 49.9 kg (110 lb)   Body mass index is 22.22 kg/m.   General appearance :Awake, alert, not in any distress. Speech Clear. Not toxic Looking Eyes:, pupils equally reactive to light and accomodation,no scleral icterus.Pink conjunctiva HEENT: Atraumatic and  Normocephalic Neck: supple, no JVD. No cervical lymphadenopathy. No thyromegaly Resp:Good air entry bilaterally, no added sounds  CVS: S1 S2 regular, no murmurs.  GI: Bowel sounds Absent, abdomen is distended but soft.  Laparoscopic incisions appear to be healing well with minimal tenderness. Extremities: B/L Lower Ext shows no edema, both legs are warm to touch Neurology:  speech  clear,Non focal, sensation is grossly intact. Psychiatric: Normal judgment and insight. Alert and oriented x 3. Normal mood. Musculoskeletal:No digital cyanosis Skin:No Rash, warm and dry Wounds:N/A  I have personally reviewed following labs and imaging studies  LABORATORY DATA: CBC:  Recent Labs Lab 08/22/16 0527 08/23/16 0554 08/25/16 0308 08/27/16 0547  WBC 9.4 7.3 13.8* 12.4*  HGB 8.3* 8.6* 8.3* 8.6*  HCT 25.5* 26.9* 26.0* 26.7*  MCV 97.0 98.5 97.0 96.7  PLT 561* 582* 516* 503*    Basic Metabolic Panel:  Recent Labs Lab 08/22/16 0527 08/23/16 0554 08/25/16 0308 08/26/16 0811 08/27/16 0547  NA 138 136 130* 133* 128*  K 4.1 3.5 4.4 4.3 4.5  CL 104 101 94* 93* 89*  CO2 25 26 29 30 27   GLUCOSE 92 86 109* 104* 123*  BUN 15 8 12 14 12   CREATININE 0.72 0.65 0.80 0.69 0.80  CALCIUM 8.7* 8.9 9.2 9.5 9.3    GFR: Estimated Creatinine Clearance: 43.4 mL/min (by C-G formula based on SCr of 0.8 mg/dL).  Liver Function Tests:  Recent Labs Lab 08/22/16 0527 08/23/16 0554 08/26/16 0811 08/27/16 0547  AST 17 20 20 30   ALT 14 11* 12* 14  ALKPHOS 69 64 86 63  BILITOT 0.2* 0.8 0.5 0.5  PROT 5.6* 5.6* 6.6 6.0*  ALBUMIN 2.2* 2.3* 2.9* 2.7*   No results for input(s): LIPASE, AMYLASE in the last 168 hours. No results for input(s): AMMONIA in the last 168 hours.  Coagulation Profile:  Recent Labs Lab 08/21/16 1732  INR 1.07    Cardiac Enzymes: No results for input(s): CKTOTAL, CKMB, CKMBINDEX, TROPONINI in the last 168 hours.  BNP (last 3 results) No results for input(s): PROBNP in the last 8760 hours.  HbA1C: No results for input(s): HGBA1C in the last 72 hours.  CBG:  Recent Labs Lab 08/27/16 0740 08/27/16 1148 08/27/16 1656 08/28/16 0001 08/28/16 0723  GLUCAP 113* 97 149* 109* 106*    Lipid Profile: No results for input(s): CHOL, HDL, LDLCALC, TRIG, CHOLHDL, LDLDIRECT in the last 72 hours.  Thyroid Function Tests: No results for input(s):  TSH, T4TOTAL, FREET4, T3FREE, THYROIDAB in the last 72 hours.  Anemia Panel: No results for input(s): VITAMINB12, FOLATE, FERRITIN, TIBC, IRON, RETICCTPCT in the last 72 hours.  Urine analysis:    Component Value Date/Time   COLORURINE STRAW (A) 08/27/2016 0829   APPEARANCEUR CLEAR 08/27/2016 0829   LABSPEC 1.004 (L) 08/27/2016 0829   PHURINE 7.0 08/27/2016 0829   GLUCOSEU NEGATIVE 08/27/2016 0829   HGBUR NEGATIVE 08/27/2016 0829   BILIRUBINUR NEGATIVE 08/27/2016 0829   KETONESUR NEGATIVE 08/27/2016 0829   PROTEINUR NEGATIVE 08/27/2016 0829   UROBILINOGEN 0.2 06/27/2016 1216   NITRITE NEGATIVE 08/27/2016 0829   LEUKOCYTESUR NEGATIVE 08/27/2016 0829    Sepsis Labs: Lactic Acid, Venous    Component Value Date/Time   LATICACIDVEN 0.58 08/21/2016 1604    MICROBIOLOGY: Recent Results (from the past 240 hour(s))  Surgical PCR screen     Status: None   Collection Time: 08/25/16  1:00 PM  Result Value Ref Range Status   MRSA, PCR NEGATIVE NEGATIVE Final   Staphylococcus aureus  NEGATIVE NEGATIVE Final    Comment:        The Xpert SA Assay (FDA approved for NASAL specimens in patients over 73 years of age), is one component of a comprehensive surveillance program.  Test performance has been validated by [facility] for patients greater than or equal to 87 year old. It is not intended to diagnose infection nor to guide or monitor treatment.     RADIOLOGY STUDIES/RESULTS: Dg Abd 1 View  Result Date: 08/28/2016 CLINICAL DATA:  Abdominal distention.  Pain.  Constipation . EXAM: ABDOMEN - 1 VIEW COMPARISON:  08/22/2016.  CT 08/21/2016. FINDINGS: Surgical clips right upper quadrant. Prominent distended loops of bowel are again noted. No significant change from prior exams. Stool noted in what appears to be the colon. No free air. Lumbar spine scoliosis concave left. Diffuse degenerative change. IMPRESSION: Diffuse prominent distended loops of bowel are again noted. Stool is  noted in what appears to be the the colon. Findings on today's exam again suggest adynamic ileus. Continued follow-up exams to demonstrate resolution in order to exclude bowel obstruction suggested . Electronically Signed   By: [place]   On: 08/28/2016 10:58   Ct Abdomen Pelvis W Contrast  Result Date: 08/21/2016 CLINICAL DATA:  Abdominal pain and distention.  Constipation. EXAM: CT ABDOMEN AND PELVIS WITH CONTRAST TECHNIQUE: Multidetector CT imaging of the abdomen and pelvis was performed using the standard protocol following bolus administration of intravenous contrast. CONTRAST:  114mL ISOVUE-300 IOPAMIDOL (ISOVUE-300) INJECTION 61% COMPARISON:  07/16/2016 FINDINGS: Lower chest: Mild atelectasis in both lower lobes. No pleural or pericardial fluid. Small hiatal hernia. Hepatobiliary: No liver parenchymal abnormality is seen. There is intra and extrahepatic biliary ductal dilatation. There are multiple stones in the gallbladder. There appears to be a filling defect in the distal common duct, non calcified, most consistent with a passing stone. Pancreas: Normal Spleen: Normal Adrenals/Urinary Tract: Normal appearance of the kidneys except for vascular calcification. Adrenal glands are normal. Stomach/Bowel: Markedly dilated fluid filled loops of small intestine consistent with small bowel obstruction. The colon is essentially collapsed. Few loops of collapsed ileum are noted. Vascular/Lymphatic: Aortic atherosclerosis. No aneurysm. IVC is normal. Reproductive: Previous hysterectomy.  No pelvic mass. Other: Small amount of free fluid.  No free air. Musculoskeletal: Curvature in chronic degenerative change of the spine. Superior endplate fracture of L2 is chronic. IMPRESSION: High-grade small bowel obstruction. Small amount of free fluid. No free air. Chololithiasis. Intra and extra hepatic biliary ductal dilatation. Probable choledocholithiasis with a stone or stones in the distal duct. Electronically  Signed   By: Nelson Chimes M.D.   On: 08/21/2016 13:59   Dg Chest Port 1 View  Result Date: 08/21/2016 CLINICAL DATA:  Preoperative respiratory evaluation. EXAM: PORTABLE CHEST 1 VIEW COMPARISON:  08/21/2016, earlier the same day. FINDINGS: The lungs are clear wiithout focal pneumonia, edema, pneumothorax or pleural effusion. The cardiopericardial silhouette is within normal limits for size. NG tube tip is in the proximal stomach with proximal port of the NG tube in the distal esophagus. Bones are diffusely demineralized. IMPRESSION: 1. No acute cardiopulmonary findings. 2. The tip of the NG tube is just barely into the stomach with proximal port in the esophagus. Electronically Signed   By: Misty Stanley M.D.   On: 08/21/2016 18:05   Dg Ercp Biliary & Pancreatic Ducts  Result Date: 08/24/2016 CLINICAL DATA:  Cholelithiasis with biliary ductal dilatation, choledocholithiasis suspected on CT EXAM: ERCP TECHNIQUE: Multiple spot images obtained with the fluoroscopic  device and submitted for interpretation post-procedure. FLUOROSCOPY TIME:  1 minutes 31 second COMPARISON:  CT 08/21/2016 FINDINGS: Two spot images document endoscopic cannulation and opacification of the CBD with advancement of a catheter into the proximal CBD. There is incomplete opacification of the intrahepatic biliary tree which appears mildly distended centrally. Cystic duct is patent. Partial opacification of the gallbladder lumen demonstrating multiple filling defects . Possible filling defects in the CBD on the second image. IMPRESSION: 1. Cholelithiasis and possible choledocholithiasis, with endoscopic intervention. These images were submitted for radiologic interpretation only. Please see the procedural report for the amount of contrast and the fluoroscopy time utilized. Electronically Signed   By: Lucrezia Europe M.D.   On: 08/24/2016 09:27   Dg Abdomen Acute W/chest  Result Date: 08/21/2016 CLINICAL DATA:  Pain and abdominal distention for  2 days with nausea. EXAM: DG ABDOMEN ACUTE W/ 1V CHEST COMPARISON:  Chest radiograph 07/19/2016. FINDINGS: Frontal view of the chest shows midline trachea and normal heart size. Thoracic aorta is calcified. Biapical pleuroparenchymal scarring. No airspace consolidation or pleural fluid. There is marked gaseous distention of colon with a fair amount of stool and air-fluid levels. Gas is seen in the rectosigmoid colon. There may be bowel gas extending into the right inguinal canal. No definite small bowel dilatation. Dextroconvex scoliosis, centered near the thoracolumbar junction, with degenerative changes. IMPRESSION: 1. Bowel gas pattern is indicative of a colonic ileus. Distal colorectal obstruction cannot be excluded. 2. There may be bowel gas extending into the right inguinal canal. Difficult to exclude a hernia. 3. CT abdomen pelvis with contrast may be helpful in further evaluation, as clinically indicated. Electronically Signed   By: Lorin Picket M.D.   On: 08/21/2016 13:34   Dg Abd Portable 1v-small Bowel Obstruction Protocol-initial, 8 Hr Delay  Result Date: 08/22/2016 CLINICAL DATA:  Small bowel obstruction.  8 hour delayed film. EXAM: PORTABLE ABDOMEN - 1 VIEW COMPARISON:  08/21/2016 FINDINGS: Enteric tube tip is in the left upper quadrant consistent with location in the stomach. The tube is kinked at the proximal side hole. Positioning is not significantly changed since prior study. Contrast material is demonstrated throughout the colon and into the rectum. Presence of contrast material in the colon and mild gaseous distention of small bowel suggests partial obstruction or ileus. IMPRESSION: Contrast material demonstrated throughout the colon. Electronically Signed   By: Lucienne Capers M.D.   On: 08/22/2016 06:24   Dg Abd Portable 1v  Result Date: 08/21/2016 CLINICAL DATA:  Nasogastric tube placement. EXAM: PORTABLE ABDOMEN - 1 VIEW COMPARISON:  Radiographs of same day. FINDINGS: Distal tip of  nasogastric tube is seen in proximal stomach. Dilated small bowel loops are again noted. IMPRESSION: Distal tip of nasogastric tube is seen in expected position of proximal stomach. Dilated small bowel loops are noted concerning for distal small bowel obstruction. Electronically Signed   By: Marijo Conception, M.D.   On: 08/21/2016 20:28   Dg Abd Portable 1 View  Result Date: 08/21/2016 CLINICAL DATA:  71 year old female enteric tube placement. Initial encounter. EXAM: PORTABLE ABDOMEN - 1 VIEW COMPARISON:  CT Abdomen and Pelvis 1342 hours today. FINDINGS: Portable AP supine view at 1646 hours. Enteric tube courses to the stomach. The side hole is in the distal thoracic esophagus. Negative lung bases. Small volume of excreted IV contrast in the renal collecting systems stable bowel gas pattern. IMPRESSION: 1. Enteric tube side hole in the distal thoracic esophagus. Advance 8 cm to ensure side hole placement  within the stomach. 2. Stable bowel gas pattern from the CT Abdomen and Pelvis today. Electronically Signed   By: Genevie Ann M.D.   On: 08/21/2016 16:57     LOS: 7 days   [facility] Pager:[phone]  If 7PM-7AM, please contact night-coverage [email or web address] Password [email or web address] 08/28/2016, 1:46 PM

## 2016-08-28 NOTE — Progress Notes (Signed)
[facility] Progress Note  2 Days Post-Op  Subjective: Pt feeling good. Mild abdominal pain worse with movement. No flatus. She is tolerating her diet. Abdomen swollen. No fever, chills, nausea or vomiting overnight.   Objective: Vital signs in last 24 hours: Temp:  [98.2 F (36.8 C)-99.9 F (37.7 C)] 99.9 F (37.7 C) (02/14 0500) Pulse Rate:  [76-80] 79 (02/14 0500) Resp:  [18-19] 19 (02/14 0500) BP: (106-137)/(52-77) 121/61 (02/14 0500) SpO2:  [100 %] 100 % (02/14 0500) Last BM Date: 08/25/16  Intake/Output from previous day: 02/13 0701 - 02/14 0700 In: 1808 [P.O.:1557; I.V.:251] Out: 2750 [Urine:2750] Intake/Output this shift: No intake/output data recorded.  PE: Gen:  Alert, NAD, pleasant, cooperative, well appearing, lying in bed Card:  RRR, no M/G/R heard Pulm:  CTA, no W/R/R, effort normal Abd: Soft, moderately distended, tympanic, hypoactive BS, incisions C/D/I, mild generalized TTP Skin: no rashes noted, not diaphoretic, warm and dry  Lab Results:   Recent Labs  08/27/16 0547  WBC 12.4*  HGB 8.6*  HCT 26.7*  PLT 503*   BMET  Recent Labs  08/26/16 0811 08/27/16 0547  NA 133* 128*  K 4.3 4.5  CL 93* 89*  CO2 30 27  GLUCOSE 104* 123*  BUN 14 12  CREATININE 0.69 0.80  CALCIUM 9.5 9.3   PT/INR No results for input(s): LABPROT, INR in the last 72 hours. CMP     Component Value Date/Time   NA 128 (L) 08/27/2016 0547   K 4.5 08/27/2016 0547   CL 89 (L) 08/27/2016 0547   CO2 27 08/27/2016 0547   GLUCOSE 123 (H) 08/27/2016 0547   BUN 12 08/27/2016 0547   CREATININE 0.80 08/27/2016 0547   CALCIUM 9.3 08/27/2016 0547   PROT 6.0 (L) 08/27/2016 0547   ALBUMIN 2.7 (L) 08/27/2016 0547   AST 30 08/27/2016 0547   ALT 14 08/27/2016 0547   ALKPHOS 63 08/27/2016 0547   BILITOT 0.5 08/27/2016 0547   GFRNONAA >60 08/27/2016 0547   GFRAA >60 08/27/2016 0547   Lipase     Component Value Date/Time   LIPASE 22 08/21/2016 1005        Studies/Results: No results found.  Anti-infectives: Anti-infectives    Start     Dose/Rate Route Frequency Ordered Stop   08/26/16 0600  ceFAZolin (ANCEF) IVPB 2g/100 mL premix     2 g 200 mL/hr over 30 Minutes Intravenous On call to O.R. 08/25/16 1226 08/26/16 1156       Assessment/Plan Choledocholithiasis, intra-abdominal adhesions LAPAROSCOPIC CHOLECYSTECTOMY and LYSIS OF ADHESIONS, 2/12 Dr. Grandville Silos - WBC trending down 12.4 (2/13), afebrile - no flatus and distended - concerns for developing ileus  ID - ancef perioperative FEN - regular diet VTE - SCDs  Plan - planning for SNF vs ALF at discharge. PT recommends ALF. Encourage mobilization and incentive spirometry. Concerns for developing ileus although no nausea or vomiting. Will monitor. UA negative for signs of UTI.   LOS: 7 days    Kalman Drape , [facility] 08/28/2016, 7:44 AM Pager: [phone] Consults: [phone] Mon-Fri 7:00 am-4:30 pm Sat-Sun 7:00 am-11:30 am

## 2016-08-28 NOTE — Progress Notes (Signed)
Physical Therapy Treatment Patient Details Name: Ashley Howard MRN: [id number] DOB: 05-13-1946 Today's Date: 08/28/2016    History of Present Illness Pt admitted through ED on 08/21/16 from [facility] with exacerbation of SBO and gall bladder stones. Pt was recently hospitalized in January for similar symptoms. Pt was in [place] for short term rehab. 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     PT Comments    Pt admitted with above diagnosis. Pt currently with functional limitations due to balance and endurance deficits. Pt was able to ambulate a short distance but continues with poor safety awareness and confusion.  Feel that with pt weakness, poor balance and poor safety, will benefit from SNF.   Pt will benefit from skilled PT to increase their independence and safety with mobility to allow discharge to the venue listed below.    Follow Up Recommendations  SNF;Supervision/Assistance - 24 hour (transition to ALF after SNF)     Equipment Recommendations  Rolling walker with 5" wheels;3in1 (PT)    Recommendations for Other Services       Precautions / Restrictions Precautions Precautions: None Restrictions Weight Bearing Restrictions: No    Mobility  Bed Mobility Overal bed mobility: Modified Independent             General bed mobility comments: Got OOB by herself but foot of bed elevated and pt unaware that it was up.    Transfers Overall transfer level: Needs assistance Equipment used: Rolling walker (2 wheeled) Transfers: Sit to/from Stand Sit to Stand: Min guard;Min assist         General transfer comment: Did not need assist to steady; pt did not use RW and walked over to a wheelchair that X-ray brought into room as PT starting.  Pt slow gait but no LOB however pt was reaching for objects to steady herself with slow guarded gait.  Poor awareness of surroundings. When getting  onto transport chair, pt not listening and trying to climb onto the end of the chair when PT was trying to tell her she can sit on the side and swing LEs around. Had to stop pt and cue her the proper way to get into chair.     Ambulation/Gait Ambulation/Gait assistance: Min assist;Min guard   Assistive device: None Gait Pattern/deviations: Step-through pattern;Decreased stride length;Wide base of support Gait velocity: decreased Gait velocity interpretation: Below normal speed for age/gender General Gait Details: constant verbal and tactile cues for safe ambulation as she was reaching for end of bed and for therapist at times.     Stairs            Wheelchair Mobility    Modified Rankin (Stroke Patients Only)       Balance Overall balance assessment: Needs assistance Sitting-balance support: No upper extremity supported;Feet supported Sitting balance-Leahy Scale: Good     Standing balance support: No upper extremity supported;During functional activity Standing balance-Leahy Scale: Poor Standing balance comment: cannot stand statically without UE support.              High level balance activites: Direction changes;Turns;Sudden stops;Head turns High Level Balance Comments: Needs min to min guard assist with challenges.     Cognition Arousal/Alertness: Awake/alert Behavior During Therapy: Impulsive;Restless Overall Cognitive Status: Impaired/Different from baseline Area of Impairment: Orientation;Memory;Following commands;Safety/judgement;Problem solving Orientation Level: Disoriented to;Time;Situation   Memory: Decreased short-term memory Following Commands: Follows one step commands inconsistently;Follows one step commands with increased time Safety/Judgement:  Decreased awareness of safety   Problem Solving: Requires verbal cues;Requires tactile cues General Comments: pt confused and impulsive this session; pulling up gown when not prompted and showing PT her  abdomen.  Poor awareness of surroundings.     Exercises      General Comments General comments (skin integrity, edema, etc.): Pt had jsut walked the entire unit with the nurse per nurse. Pt is agreeable to go to SNF when PT pointed out pts safety issues.       Pertinent Vitals/Pain Pain Assessment: Faces Faces Pain Scale: Hurts little more Pain Location: abdomen with bed mobility and coughing/sneezing Pain Descriptors / Indicators: Aching;Grimacing;Guarding;Sharp Pain Intervention(s): Limited activity within patient's tolerance;Monitored during session;Premedicated before session;Repositioned   VSS with sats 96% on [facility] Home Living                      Prior Function            PT Goals (current goals can now be found in the care plan section) Acute Rehab PT Goals Patient Stated Goal: none stated Progress towards PT goals: Progressing toward goals    Frequency    Min 3X/week      PT Plan Discharge plan needs to be updated    Co-evaluation             End of Session Equipment Utilized During Treatment: Gait belt Activity Tolerance: Other (comment);Patient tolerated treatment well (limited by confusion) Patient left: with call bell/phone within reach;in chair;Other (comment) (with X-ray tech)     Time: 1030-1040 PT Time Calculation (min) (ACUTE ONLY): 10 min  Charges:  $Gait Training: 8-22 mins                    G Codes:      Ashley Howard 08-29-16, 11:01 AM Ashley Howard,PT Acute Rehabilitation [phone] (pager)

## 2016-08-28 NOTE — Clinical Social Work Note (Signed)
Clinical Social Work Assessment  Patient Details  Name: Ashley Howard MRN: [id number] Date of Birth: [birth year]  Date of referral:  08/28/16               Reason for consult:  Facility Placement, Discharge Planning                Permission sought to share information with:  Facility Sport and exercise psychologist, Family Supports Permission granted to share information::  Yes, Verbal Permission Granted  Name::     Lavaeh Bau  Agency::  SNF's  Relationship::  Son  Contact Information:  [phone]  Housing/Transportation Living arrangements for the past 2 months:  [place] of Information:  Patient, Medical Team, Adult Children, Other (Comment Required) (Daughter-in-law) Patient Interpreter Needed:  None Criminal Activity/Legal Involvement Pertinent to Current Situation/Hospitalization:  No - Comment as needed Significant Relationships:  Adult Children, Friend, Spouse Lives with:  Spouse Do you feel safe going back to the place where you live?  No Need for family participation in patient care:  Yes (Comment)  Care giving concerns:  PT recommending SNF once medically stable for discharge.   Social Worker assessment / plan:  CSW met with patient. Son and daughter-in-law at bedside. CSW introduced role and explained that PT recommendations would be discussed. Patient and family are agreeable to SNF placement. Patient is in her copay days but her son and daughter-in-law will pay for that so that she can get the care that is recommended. Patient and her family would like for her to return to [place]. No further concerns. CSW encouraged patient and her family to contact CSW as needed. CSW will continue to follow patient and her family for support and facilitate discharge to SNF once medically stable.  Employment status:  Retired Nurse, adult PT Recommendations:  [place] / Referral to community resources:   [place]  Patient/Family's Response to care:  Patient and her family agreeable to SNF placement. Patient's family supportive and involved in patient's care. Patient and her family appreciated social work intervention.  Patient/Family's Understanding of and Emotional Response to Diagnosis, Current Treatment, and Prognosis:  Patient and her family appear to have a good understanding of the reason for her admission. Patient and her family appear happy with hospital care.  Emotional Assessment Appearance:  Appears stated age Attitude/Demeanor/Rapport:  Other (Pleasant) Affect (typically observed):  Accepting, Appropriate, Calm, Pleasant Orientation:  Oriented to Self, Oriented to Place, Oriented to  Time, Oriented to Situation Alcohol / Substance use:  Never Used Psych involvement (Current and /or in the community):  No (Comment)  Discharge Needs  Concerns to be addressed:  Care Coordination Readmission within the last 30 days:  No Current discharge risk:  Dependent with Mobility Barriers to Discharge:  Continued Medical Work up   Candie Chroman, LCSW 08/28/2016, 3:28 PM

## 2016-08-29 DIAGNOSIS — R531 Weakness: Secondary | ICD-10-CM | POA: Diagnosis not present

## 2016-08-29 DIAGNOSIS — K9189 Other postprocedural complications and disorders of digestive system: Secondary | ICD-10-CM | POA: Diagnosis not present

## 2016-08-29 DIAGNOSIS — D72828 Other elevated white blood cell count: Secondary | ICD-10-CM | POA: Diagnosis not present

## 2016-08-29 DIAGNOSIS — K5641 Fecal impaction: Secondary | ICD-10-CM | POA: Diagnosis not present

## 2016-08-29 DIAGNOSIS — K56609 Unspecified intestinal obstruction, unspecified as to partial versus complete obstruction: Secondary | ICD-10-CM | POA: Diagnosis not present

## 2016-08-29 DIAGNOSIS — E871 Hypo-osmolality and hyponatremia: Secondary | ICD-10-CM | POA: Diagnosis not present

## 2016-08-29 DIAGNOSIS — E785 Hyperlipidemia, unspecified: Secondary | ICD-10-CM | POA: Diagnosis not present

## 2016-08-29 DIAGNOSIS — K5900 Constipation, unspecified: Secondary | ICD-10-CM | POA: Diagnosis not present

## 2016-08-29 DIAGNOSIS — K8050 Calculus of bile duct without cholangitis or cholecystitis without obstruction: Secondary | ICD-10-CM | POA: Diagnosis not present

## 2016-08-29 DIAGNOSIS — J309 Allergic rhinitis, unspecified: Secondary | ICD-10-CM | POA: Diagnosis not present

## 2016-08-29 DIAGNOSIS — M6281 Muscle weakness (generalized): Secondary | ICD-10-CM | POA: Diagnosis not present

## 2016-08-29 DIAGNOSIS — Z48815 Encounter for surgical aftercare following surgery on the digestive system: Secondary | ICD-10-CM | POA: Diagnosis not present

## 2016-08-29 DIAGNOSIS — G8929 Other chronic pain: Secondary | ICD-10-CM | POA: Diagnosis not present

## 2016-08-29 DIAGNOSIS — M792 Neuralgia and neuritis, unspecified: Secondary | ICD-10-CM | POA: Diagnosis not present

## 2016-08-29 DIAGNOSIS — K560 Paralytic ileus: Secondary | ICD-10-CM | POA: Diagnosis not present

## 2016-08-29 DIAGNOSIS — Z09 Encounter for follow-up examination after completed treatment for conditions other than malignant neoplasm: Secondary | ICD-10-CM | POA: Diagnosis not present

## 2016-08-29 DIAGNOSIS — Z9049 Acquired absence of other specified parts of digestive tract: Secondary | ICD-10-CM | POA: Diagnosis not present

## 2016-08-29 DIAGNOSIS — R109 Unspecified abdominal pain: Secondary | ICD-10-CM | POA: Diagnosis not present

## 2016-08-29 DIAGNOSIS — R630 Anorexia: Secondary | ICD-10-CM | POA: Diagnosis not present

## 2016-08-29 DIAGNOSIS — D62 Acute posthemorrhagic anemia: Secondary | ICD-10-CM | POA: Diagnosis not present

## 2016-08-29 DIAGNOSIS — K5649 Other impaction of intestine: Secondary | ICD-10-CM | POA: Diagnosis not present

## 2016-08-29 DIAGNOSIS — K219 Gastro-esophageal reflux disease without esophagitis: Secondary | ICD-10-CM | POA: Diagnosis not present

## 2016-08-29 DIAGNOSIS — E43 Unspecified severe protein-calorie malnutrition: Secondary | ICD-10-CM | POA: Diagnosis not present

## 2016-08-29 DIAGNOSIS — D649 Anemia, unspecified: Secondary | ICD-10-CM | POA: Diagnosis not present

## 2016-08-29 DIAGNOSIS — R195 Other fecal abnormalities: Secondary | ICD-10-CM | POA: Diagnosis not present

## 2016-08-29 LAB — GLUCOSE, CAPILLARY
GLUCOSE-CAPILLARY: 115 mg/dL — AB (ref 65–99)
Glucose-Capillary: 186 mg/dL — ABNORMAL HIGH (ref 65–99)

## 2016-08-29 MED ORDER — BISACODYL 10 MG RE SUPP
10.0000 mg | Freq: Every day | RECTAL | Status: DC | PRN
  Administered 2016-08-29: 10 mg via RECTAL
  Filled 2016-08-29: qty 1

## 2016-08-29 MED ORDER — TRAMADOL HCL 50 MG PO TABS: 50.0000 mg | ORAL_TABLET | Freq: Four times a day (QID) | ORAL | 0 refills | Status: DC | PRN

## 2016-08-29 NOTE — Progress Notes (Signed)
[facility] Progress Note  3 Days Post-Op  Subjective: Pt having flatus. No BM yet. Mild nausea with ambulation. Tolerating diet. Agreed to try a suppository. No acute events overnight.   Objective: Vital signs in last 24 hours: Temp:  [98.3 F (36.8 C)-99.6 F (37.6 C)] 99.6 F (37.6 C) (02/15 0603) Pulse Rate:  [65-71] 67 (02/15 0603) Resp:  [18-19] 19 (02/15 0603) BP: (106-125)/(58-67) 106/62 (02/15 0603) SpO2:  [98 %-99 %] 98 % (02/15 0603) Last BM Date: 08/25/16  Intake/Output from previous day: 02/14 0701 - 02/15 0700 In: 240 [P.O.:240] Out: 1500 [Urine:1500] Intake/Output this shift: No intake/output data recorded.  PE: Gen:  Alert, NAD, pleasant, cooperative, well appearing, lying in bed Card:  RRR, no M/G/R heard Pulm:  rate and effort normal Abd: Soft, moderately distended, tympanic, + BS, incisions C/D/I, mild generalized TTP around incisions Skin: no rashes noted, not diaphoretic, warm and dry  Lab Results:   Recent Labs  08/27/16 0547  WBC 12.4*  HGB 8.6*  HCT 26.7*  PLT 503*   BMET  Recent Labs  08/27/16 0547  NA 128*  K 4.5  CL 89*  CO2 27  GLUCOSE 123*  BUN 12  CREATININE 0.80  CALCIUM 9.3   PT/INR No results for input(s): LABPROT, INR in the last 72 hours. CMP     Component Value Date/Time   NA 128 (L) 08/27/2016 0547   K 4.5 08/27/2016 0547   CL 89 (L) 08/27/2016 0547   CO2 27 08/27/2016 0547   GLUCOSE 123 (H) 08/27/2016 0547   BUN 12 08/27/2016 0547   CREATININE 0.80 08/27/2016 0547   CALCIUM 9.3 08/27/2016 0547   PROT 6.0 (L) 08/27/2016 0547   ALBUMIN 2.7 (L) 08/27/2016 0547   AST 30 08/27/2016 0547   ALT 14 08/27/2016 0547   ALKPHOS 63 08/27/2016 0547   BILITOT 0.5 08/27/2016 0547   GFRNONAA >60 08/27/2016 0547   GFRAA >60 08/27/2016 0547   Lipase     Component Value Date/Time   LIPASE 22 08/21/2016 1005       Studies/Results: Dg Abd 1 View  Result Date: 08/28/2016 CLINICAL DATA:  Abdominal  distention.  Pain.  Constipation . EXAM: ABDOMEN - 1 VIEW COMPARISON:  08/22/2016.  CT 08/21/2016. FINDINGS: Surgical clips right upper quadrant. Prominent distended loops of bowel are again noted. No significant change from prior exams. Stool noted in what appears to be the colon. No free air. Lumbar spine scoliosis concave left. Diffuse degenerative change. IMPRESSION: Diffuse prominent distended loops of bowel are again noted. Stool is noted in what appears to be the the colon. Findings on today's exam again suggest adynamic ileus. Continued follow-up exams to demonstrate resolution in order to exclude bowel obstruction suggested . Electronically Signed   By: Marcello Moores  Register   On: 08/28/2016 10:58    Anti-infectives: Anti-infectives    Start     Dose/Rate Route Frequency Ordered Stop   08/26/16 0600  ceFAZolin (ANCEF) IVPB 2g/100 mL premix     2 g 200 mL/hr over 30 Minutes Intravenous On call to O.R. 08/25/16 1226 08/26/16 1156       Assessment/Plan  Choledocholithiasis, intra-abdominal adhesions LAPAROSCOPIC CHOLECYSTECTOMY and LYSIS OF ADHESIONS,2/12 Dr. Grandville Silos - afebrile - + flatus and distended improved from yesterday - ab film yesterday showed: Diffuse prominent distended loops of bowel are again noted. Stool is noted in what appears to be the the colon. Findings on today's exam again suggest adynamic ileus.  ID - ancef  perioperative FEN - regular diet VTE - SCDs  Plan - planning for SNF vs ALF at discharge. PT recommends ALF. Encourage mobilization and incentive spirometry. Pt agreed to try suppository. Pt is having flatus so less concerning for ileus. Pt is okay for discharge from a surgical standpoint.    LOS: 8 days    Kalman Drape , [facility] 08/29/2016, 8:57 AM Pager: [phone] Consults: [phone] Mon-Fri 7:00 am-4:30 pm Sat-Sun 7:00 am-11:30 am

## 2016-08-29 NOTE — Clinical Social Work Placement (Signed)
   CLINICAL SOCIAL WORK PLACEMENT  NOTE  Date:  08/29/2016  Patient Details  Name: Ashley Howard MRN: [id number] Date of Birth: 02-Feb-1946  Clinical Social Work is seeking post-discharge placement for this patient at the [place] level of care (*CSW will initial, date and re-position this form in  chart as items are completed):  Yes   Patient/family provided with [place] Work Department's list of facilities offering this level of care within the geographic area requested by the patient (or if unable, by the patient's family).  Yes   Patient/family informed of their freedom to choose among providers that offer the needed level of care, that participate in Medicare, Medicaid or managed care program needed by the patient, have an available bed and are willing to accept the patient.  Yes   Patient/family informed of [place]'s ownership interest in [facility] and [facility], as well as of the fact that they are under no obligation to receive care at these facilities.  PASRR submitted to EDS on       PASRR number received on       Existing PASRR number confirmed on 08/28/16     FL2 transmitted to all facilities in geographic area requested by pt/family on 08/28/16     FL2 transmitted to all facilities within larger geographic area on       Patient informed that his/her managed care company has contracts with or will negotiate with certain facilities, including the following:        Yes   Patient/family informed of bed offers received.  Patient chooses bed at [place] recommends and patient chooses bed at      Patient to be transferred to [facility] on 08/29/16.  Patient to be transferred to facility by [facility]     Patient family notified on 08/29/16 of transfer.  Name of family member notified:  [name]      PHYSICIAN       Additional Comment:     _______________________________________________ Truitt Merle, [place] 08/29/2016, 2:26 PM

## 2016-08-29 NOTE — Progress Notes (Signed)
Patient discharged to [facility] facility, reported was given to nurse Katherine Roan.

## 2016-08-29 NOTE — Clinical Social Work Note (Signed)
Pt is ready for discharge today and will go to [facility]. Pt and son, Yula Rostron, are aware and agreeable to discharge plan. Son is aware of possible copay days for pt SNF stay. CSW sent clinicals to [facility] and communicated with [facility] (admissions) for room and report. Room and report provided to RN and put in treatment team sticky note. Transportation arranged with [facility]. CSW is signing off as no further needs identified.   Oretha Ellis, Latanya Presser, [place] Social Worker  [phone]

## 2016-08-29 NOTE — Discharge Summary (Signed)
PATIENT DETAILS Name: Ashley Howard Age: 71 y.o. Sex: female Date of Birth: Feb 18, 1946 MRN: [id number]. Admitting Physician: Waldemar Dickens, MD [id number] [name], MD  Admit Date: 08/21/2016 Discharge date: 08/29/2016  Recommendations for Outpatient Follow-up:  1. Follow up with PCP in 1-2 weeks 2. Please obtain BMP/CBC in one week 3. Please ensure follow-up with general surgery  Admitted From:  SNF  Disposition: ALF    Home Health: No  Equipment/Devices: None  Discharge Condition: Stable  CODE STATUS: FULL CODE  Diet recommendation:  Heart Healthy   Brief Summary: See H&P, Labs, Consult and Test reports for all details in brief,Patient is a 71 y.o. female with prior history of SBO (most recently in January 2018), dyslipidemia, GERD admitted on 2/7 with abdominal pain. Subsequently found to have a small bowel obstruction and a choledocholithiasis. See below for further details. patient was admitted for   Brief Hospital Course: Postoperative ileus: Resolving. Patient underwent laparoscopic cholecystectomy on 2/12-she briefly developed a ileus-however she is now passing flatus, and has significantly less distended abdomen today. Gen. surgery has recommended a Dulcolax suppository today. Okay from general surgical point of view to discharge to ALF.  Choledocholithiasis s/p laparoscopic cholecystectomy on 2/12: Unfortunately has developed ileus postoperatively-which is resolving him a okay from general surgery point of view to discharge to ALF today.  Small bowel obstruction: resolved with supportive measures.   GERD: Continue PPI  Anemia:Suspect hemoglobin of 11.0 on admission was due to hemoconcentration, recent hemoglobin appears to be around 9. Slight drop in hemoglobin likely due to IV fluid dilution. Hemoglobin appears stable- continue to follow.  Urinary incontinence: s/p bladder surgery, continue ditropan   HLD: continue statin   Chronic  Neuropathic pain: resume neurontin  Protein-calorie malnutrition, severe: Continue supplements   Procedures/Studies: ERCP 2/10 Laparoscopic cholecystectomy and laparoscopic lysis of adhesions on 2/12  Discharge Diagnoses:  Active Problems:   Protein-calorie malnutrition, severe   SBO (small bowel obstruction)   Choledocholithiasis   Urinary incontinence   GERD (gastroesophageal reflux disease)   Neuropathic pain   Hyperglycemia   Discharge Instructions:  Activity:  As tolerated with Full fall precautions use walker/cane & assistance as needed   Discharge Instructions    Call MD for:  persistant nausea and vomiting    Complete by:  As directed    Call MD for:  redness, tenderness, or signs of infection (pain, swelling, redness, odor or green/yellow discharge around incision site)    Complete by:  As directed    Call MD for:  severe uncontrolled pain    Complete by:  As directed    Diet - low sodium heart healthy    Complete by:  As directed    Increase activity slowly    Complete by:  As directed      Allergies as of 08/29/2016      Reactions   Tape Other (See Comments)   Paper tape - blisters      Medication List    TAKE these medications   acetaminophen 325 MG tablet Commonly known as:  TYLENOL Take 650 mg by mouth every 6 (six) hours as needed for mild pain.   calcium carbonate 500 MG chewable tablet Commonly known as:  TUMS - dosed in mg elemental calcium Chew 1 tablet by mouth as needed for indigestion or heartburn.   diphenhydrAMINE 25 MG tablet Commonly known as:  BENADRYL Take 25 mg by mouth every 6 (six) hours as needed for allergies.   ENSURE  PLUS Liqd Take 237 mLs by mouth daily.   gabapentin 300 MG capsule Commonly known as:  NEURONTIN Take 300 mg by mouth 3 (three) times daily.   loratadine 10 MG tablet Commonly known as:  CLARITIN Take 10 mg by mouth daily as needed for allergies.   multivitamin with minerals Tabs tablet Take 1  tablet by mouth daily.   oxybutynin 5 MG tablet Commonly known as:  DITROPAN Take 5 mg by mouth 2 (two) times daily.   pantoprazole 40 MG tablet Commonly known as:  PROTONIX Take 1 tablet (40 mg total) by mouth 2 (two) times daily.   polyethylene glycol packet Commonly known as:  MIRALAX / GLYCOLAX Take 17 g by mouth daily. Hold for loose stool   pravastatin 20 MG tablet Commonly known as:  PRAVACHOL Take 20 mg by mouth daily.   simethicone 125 MG chewable tablet Commonly known as:  MYLICON Chew 0000000 mg by mouth every 6 (six) hours as needed for flatulence.   STOOL SOFTENER PO Take 1 tablet by mouth daily.   traMADol 50 MG tablet Commonly known as:  ULTRAM Take 1 tablet (50 mg total) by mouth every 6 (six) hours as needed for moderate pain.            Durable Medical Equipment        Start     Ordered   08/28/16 0548  For home use only DME Walker rolling  Once    Question:  Patient needs a walker to treat with the following condition  Answer:  Physical deconditioning   08/28/16 [facility] Go on 09/17/2016.   Specialty:  General Surgery Why:  09/17/16 @ 11:45. Pt to arrive 19mins prior.  Contact information: [address] [place] [phone]       Maggie Font, MD. Schedule an appointment as soon as possible for a visit in 1 week(s).   Specialty:  Family Medicine Contact information: [address] [place] [phone]          Allergies  Allergen Reactions  . Tape Other (See Comments)    Paper tape - blisters    Consultations:   GI and general surgery  Other Procedures/Studies: Dg Abd 1 View  Result Date: 08/28/2016 CLINICAL DATA:  Abdominal distention.  Pain.  Constipation . EXAM: ABDOMEN - 1 VIEW COMPARISON:  08/22/2016.  CT 08/21/2016. FINDINGS: Surgical clips right upper quadrant. Prominent distended loops of bowel are  again noted. No significant change from prior exams. Stool noted in what appears to be the colon. No free air. Lumbar spine scoliosis concave left. Diffuse degenerative change. IMPRESSION: Diffuse prominent distended loops of bowel are again noted. Stool is noted in what appears to be the the colon. Findings on today's exam again suggest adynamic ileus. Continued follow-up exams to demonstrate resolution in order to exclude bowel obstruction suggested . Electronically Signed   By: Home   On: 08/28/2016 10:58   Ct Abdomen Pelvis W Contrast  Result Date: 08/21/2016 CLINICAL DATA:  Abdominal pain and distention.  Constipation. EXAM: CT ABDOMEN AND PELVIS WITH CONTRAST TECHNIQUE: Multidetector CT imaging of the abdomen and pelvis was performed using the standard protocol following bolus administration of intravenous contrast. CONTRAST:  134mL ISOVUE-300 IOPAMIDOL (ISOVUE-300) INJECTION 61% COMPARISON:  07/16/2016 FINDINGS: Lower chest: Mild atelectasis in both lower lobes. No pleural or pericardial fluid. Small  hiatal hernia. Hepatobiliary: No liver parenchymal abnormality is seen. There is intra and extrahepatic biliary ductal dilatation. There are multiple stones in the gallbladder. There appears to be a filling defect in the distal common duct, non calcified, most consistent with a passing stone. Pancreas: Normal Spleen: Normal Adrenals/Urinary Tract: Normal appearance of the kidneys except for vascular calcification. Adrenal glands are normal. Stomach/Bowel: Markedly dilated fluid filled loops of small intestine consistent with small bowel obstruction. The colon is essentially collapsed. Few loops of collapsed ileum are noted. Vascular/Lymphatic: Aortic atherosclerosis. No aneurysm. IVC is normal. Reproductive: Previous hysterectomy.  No pelvic mass. Other: Small amount of free fluid.  No free air. Musculoskeletal: Curvature in chronic degenerative change of the spine. Superior endplate fracture of L2  is chronic. IMPRESSION: High-grade small bowel obstruction. Small amount of free fluid. No free air. Chololithiasis. Intra and extra hepatic biliary ductal dilatation. Probable choledocholithiasis with a stone or stones in the distal duct. Electronically Signed   By: Nelson Chimes M.D.   On: 08/21/2016 13:59   Dg Chest Port 1 View  Result Date: 08/21/2016 CLINICAL DATA:  Preoperative respiratory evaluation. EXAM: PORTABLE CHEST 1 VIEW COMPARISON:  08/21/2016, earlier the same day. FINDINGS: The lungs are clear wiithout focal pneumonia, edema, pneumothorax or pleural effusion. The cardiopericardial silhouette is within normal limits for size. NG tube tip is in the proximal stomach with proximal port of the NG tube in the distal esophagus. Bones are diffusely demineralized. IMPRESSION: 1. No acute cardiopulmonary findings. 2. The tip of the NG tube is just barely into the stomach with proximal port in the esophagus. Electronically Signed   By: Misty Stanley M.D.   On: 08/21/2016 18:05   Dg Ercp Biliary & Pancreatic Ducts  Result Date: 08/24/2016 CLINICAL DATA:  Cholelithiasis with biliary ductal dilatation, choledocholithiasis suspected on CT EXAM: ERCP TECHNIQUE: Multiple spot images obtained with the fluoroscopic device and submitted for interpretation post-procedure. FLUOROSCOPY TIME:  1 minutes 31 second COMPARISON:  CT 08/21/2016 FINDINGS: Two spot images document endoscopic cannulation and opacification of the CBD with advancement of a catheter into the proximal CBD. There is incomplete opacification of the intrahepatic biliary tree which appears mildly distended centrally. Cystic duct is patent. Partial opacification of the gallbladder lumen demonstrating multiple filling defects . Possible filling defects in the CBD on the second image. IMPRESSION: 1. Cholelithiasis and possible choledocholithiasis, with endoscopic intervention. These images were submitted for radiologic interpretation only. Please see  the procedural report for the amount of contrast and the fluoroscopy time utilized. Electronically Signed   By: Lucrezia Europe M.D.   On: 08/24/2016 09:27   Dg Abdomen Acute W/chest  Result Date: 08/21/2016 CLINICAL DATA:  Pain and abdominal distention for 2 days with nausea. EXAM: DG ABDOMEN ACUTE W/ 1V CHEST COMPARISON:  Chest radiograph 07/19/2016. FINDINGS: Frontal view of the chest shows midline trachea and normal heart size. Thoracic aorta is calcified. Biapical pleuroparenchymal scarring. No airspace consolidation or pleural fluid. There is marked gaseous distention of colon with a fair amount of stool and air-fluid levels. Gas is seen in the rectosigmoid colon. There may be bowel gas extending into the right inguinal canal. No definite small bowel dilatation. Dextroconvex scoliosis, centered near the thoracolumbar junction, with degenerative changes. IMPRESSION: 1. Bowel gas pattern is indicative of a colonic ileus. Distal colorectal obstruction cannot be excluded. 2. There may be bowel gas extending into the right inguinal canal. Difficult to exclude a hernia. 3. CT abdomen pelvis with contrast may be helpful  in further evaluation, as clinically indicated. Electronically Signed   By: Lorin Picket M.D.   On: 08/21/2016 13:34   Dg Abd Portable 1v-small Bowel Obstruction Protocol-initial, 8 Hr Delay  Result Date: 08/22/2016 CLINICAL DATA:  Small bowel obstruction.  8 hour delayed film. EXAM: PORTABLE ABDOMEN - 1 VIEW COMPARISON:  08/21/2016 FINDINGS: Enteric tube tip is in the left upper quadrant consistent with location in the stomach. The tube is kinked at the proximal side hole. Positioning is not significantly changed since prior study. Contrast material is demonstrated throughout the colon and into the rectum. Presence of contrast material in the colon and mild gaseous distention of small bowel suggests partial obstruction or ileus. IMPRESSION: Contrast material demonstrated throughout the colon.  Electronically Signed   By: Lucienne Capers M.D.   On: 08/22/2016 06:24   Dg Abd Portable 1v  Result Date: 08/21/2016 CLINICAL DATA:  Nasogastric tube placement. EXAM: PORTABLE ABDOMEN - 1 VIEW COMPARISON:  Radiographs of same day. FINDINGS: Distal tip of nasogastric tube is seen in proximal stomach. Dilated small bowel loops are again noted. IMPRESSION: Distal tip of nasogastric tube is seen in expected position of proximal stomach. Dilated small bowel loops are noted concerning for distal small bowel obstruction. Electronically Signed   By: Marijo Conception, M.D.   On: 08/21/2016 20:28   Dg Abd Portable 1 View  Result Date: 08/21/2016 CLINICAL DATA:  71 year old female enteric tube placement. Initial encounter. EXAM: PORTABLE ABDOMEN - 1 VIEW COMPARISON:  CT Abdomen and Pelvis 1342 hours today. FINDINGS: Portable AP supine view at 1646 hours. Enteric tube courses to the stomach. The side hole is in the distal thoracic esophagus. Negative lung bases. Small volume of excreted IV contrast in the renal collecting systems stable bowel gas pattern. IMPRESSION: 1. Enteric tube side hole in the distal thoracic esophagus. Advance 8 cm to ensure side hole placement within the stomach. 2. Stable bowel gas pattern from the CT Abdomen and Pelvis today. Electronically Signed   By: Genevie Ann M.D.   On: 08/21/2016 16:57      TODAY-DAY OF DISCHARGE:  Subjective:   Merve Pellicer today has no headache,no chest abdominal pain,no new weakness tingling or numbness, feels much better wants to go home today.   Objective:   Blood pressure 106/62, pulse 67, temperature 99.6 F (37.6 C), temperature source Oral, resp. rate 19, height 4\' 11"  (1.499 m), weight 49.9 kg (110 lb), SpO2 98 %.  Intake/Output Summary (Last 24 hours) at 08/29/16 1052 Last data filed at 08/29/16 [id number]  Gross per 24 hour  Intake              240 ml  Output             1500 ml  Net            -1260 ml   Filed Weights   08/22/16 0700 08/24/16  0718  Weight: 50 kg (110 lb 3.7 oz) 49.9 kg (110 lb)    Exam: Awake Alert, Oriented *3, No new F.N deficits, Normal affect [place].AT,PERRAL Supple Neck,No JVD, No cervical lymphadenopathy appriciated.  Symmetrical Chest wall movement, Good air movement bilaterally, CTAB RRR,No Gallops,Rubs or new Murmurs, No Parasternal Heave +ve B.Sounds, Abd Soft, Non tender, No organomegaly appriciated, No rebound -guarding or rigidity. No Cyanosis, Clubbing or edema, No new Rash or bruise   PERTINENT RADIOLOGIC STUDIES: Dg Abd 1 View  Result Date: 08/28/2016 CLINICAL DATA:  Abdominal distention.  Pain.  Constipation . EXAM:  ABDOMEN - 1 VIEW COMPARISON:  08/22/2016.  CT 08/21/2016. FINDINGS: Surgical clips right upper quadrant. Prominent distended loops of bowel are again noted. No significant change from prior exams. Stool noted in what appears to be the colon. No free air. Lumbar spine scoliosis concave left. Diffuse degenerative change. IMPRESSION: Diffuse prominent distended loops of bowel are again noted. Stool is noted in what appears to be the the colon. Findings on today's exam again suggest adynamic ileus. Continued follow-up exams to demonstrate resolution in order to exclude bowel obstruction suggested . Electronically Signed   By: [place]   On: 08/28/2016 10:58   Ct Abdomen Pelvis W Contrast  Result Date: 08/21/2016 CLINICAL DATA:  Abdominal pain and distention.  Constipation. EXAM: CT ABDOMEN AND PELVIS WITH CONTRAST TECHNIQUE: Multidetector CT imaging of the abdomen and pelvis was performed using the standard protocol following bolus administration of intravenous contrast. CONTRAST:  162mL ISOVUE-300 IOPAMIDOL (ISOVUE-300) INJECTION 61% COMPARISON:  07/16/2016 FINDINGS: Lower chest: Mild atelectasis in both lower lobes. No pleural or pericardial fluid. Small hiatal hernia. Hepatobiliary: No liver parenchymal abnormality is seen. There is intra and extrahepatic biliary ductal dilatation.  There are multiple stones in the gallbladder. There appears to be a filling defect in the distal common duct, non calcified, most consistent with a passing stone. Pancreas: Normal Spleen: Normal Adrenals/Urinary Tract: Normal appearance of the kidneys except for vascular calcification. Adrenal glands are normal. Stomach/Bowel: Markedly dilated fluid filled loops of small intestine consistent with small bowel obstruction. The colon is essentially collapsed. Few loops of collapsed ileum are noted. Vascular/Lymphatic: Aortic atherosclerosis. No aneurysm. IVC is normal. Reproductive: Previous hysterectomy.  No pelvic mass. Other: Small amount of free fluid.  No free air. Musculoskeletal: Curvature in chronic degenerative change of the spine. Superior endplate fracture of L2 is chronic. IMPRESSION: High-grade small bowel obstruction. Small amount of free fluid. No free air. Chololithiasis. Intra and extra hepatic biliary ductal dilatation. Probable choledocholithiasis with a stone or stones in the distal duct. Electronically Signed   By: Nelson Chimes M.D.   On: 08/21/2016 13:59   Dg Chest Port 1 View  Result Date: 08/21/2016 CLINICAL DATA:  Preoperative respiratory evaluation. EXAM: PORTABLE CHEST 1 VIEW COMPARISON:  08/21/2016, earlier the same day. FINDINGS: The lungs are clear wiithout focal pneumonia, edema, pneumothorax or pleural effusion. The cardiopericardial silhouette is within normal limits for size. NG tube tip is in the proximal stomach with proximal port of the NG tube in the distal esophagus. Bones are diffusely demineralized. IMPRESSION: 1. No acute cardiopulmonary findings. 2. The tip of the NG tube is just barely into the stomach with proximal port in the esophagus. Electronically Signed   By: Misty Stanley M.D.   On: 08/21/2016 18:05   Dg Ercp Biliary & Pancreatic Ducts  Result Date: 08/24/2016 CLINICAL DATA:  Cholelithiasis with biliary ductal dilatation, choledocholithiasis suspected on CT  EXAM: ERCP TECHNIQUE: Multiple spot images obtained with the fluoroscopic device and submitted for interpretation post-procedure. FLUOROSCOPY TIME:  1 minutes 31 second COMPARISON:  CT 08/21/2016 FINDINGS: Two spot images document endoscopic cannulation and opacification of the CBD with advancement of a catheter into the proximal CBD. There is incomplete opacification of the intrahepatic biliary tree which appears mildly distended centrally. Cystic duct is patent. Partial opacification of the gallbladder lumen demonstrating multiple filling defects . Possible filling defects in the CBD on the second image. IMPRESSION: 1. Cholelithiasis and possible choledocholithiasis, with endoscopic intervention. These images were submitted for radiologic interpretation only. Please  see the procedural report for the amount of contrast and the fluoroscopy time utilized. Electronically Signed   By: Lucrezia Europe M.D.   On: 08/24/2016 09:27   Dg Abdomen Acute W/chest  Result Date: 08/21/2016 CLINICAL DATA:  Pain and abdominal distention for 2 days with nausea. EXAM: DG ABDOMEN ACUTE W/ 1V CHEST COMPARISON:  Chest radiograph 07/19/2016. FINDINGS: Frontal view of the chest shows midline trachea and normal heart size. Thoracic aorta is calcified. Biapical pleuroparenchymal scarring. No airspace consolidation or pleural fluid. There is marked gaseous distention of colon with a fair amount of stool and air-fluid levels. Gas is seen in the rectosigmoid colon. There may be bowel gas extending into the right inguinal canal. No definite small bowel dilatation. Dextroconvex scoliosis, centered near the thoracolumbar junction, with degenerative changes. IMPRESSION: 1. Bowel gas pattern is indicative of a colonic ileus. Distal colorectal obstruction cannot be excluded. 2. There may be bowel gas extending into the right inguinal canal. Difficult to exclude a hernia. 3. CT abdomen pelvis with contrast may be helpful in further evaluation, as  clinically indicated. Electronically Signed   By: Lorin Picket M.D.   On: 08/21/2016 13:34   Dg Abd Portable 1v-small Bowel Obstruction Protocol-initial, 8 Hr Delay  Result Date: 08/22/2016 CLINICAL DATA:  Small bowel obstruction.  8 hour delayed film. EXAM: PORTABLE ABDOMEN - 1 VIEW COMPARISON:  08/21/2016 FINDINGS: Enteric tube tip is in the left upper quadrant consistent with location in the stomach. The tube is kinked at the proximal side hole. Positioning is not significantly changed since prior study. Contrast material is demonstrated throughout the colon and into the rectum. Presence of contrast material in the colon and mild gaseous distention of small bowel suggests partial obstruction or ileus. IMPRESSION: Contrast material demonstrated throughout the colon. Electronically Signed   By: Lucienne Capers M.D.   On: 08/22/2016 06:24   Dg Abd Portable 1v  Result Date: 08/21/2016 CLINICAL DATA:  Nasogastric tube placement. EXAM: PORTABLE ABDOMEN - 1 VIEW COMPARISON:  Radiographs of same day. FINDINGS: Distal tip of nasogastric tube is seen in proximal stomach. Dilated small bowel loops are again noted. IMPRESSION: Distal tip of nasogastric tube is seen in expected position of proximal stomach. Dilated small bowel loops are noted concerning for distal small bowel obstruction. Electronically Signed   By: Marijo Conception, M.D.   On: 08/21/2016 20:28   Dg Abd Portable 1 View  Result Date: 08/21/2016 CLINICAL DATA:  71 year old female enteric tube placement. Initial encounter. EXAM: PORTABLE ABDOMEN - 1 VIEW COMPARISON:  CT Abdomen and Pelvis 1342 hours today. FINDINGS: Portable AP supine view at 1646 hours. Enteric tube courses to the stomach. The side hole is in the distal thoracic esophagus. Negative lung bases. Small volume of excreted IV contrast in the renal collecting systems stable bowel gas pattern. IMPRESSION: 1. Enteric tube side hole in the distal thoracic esophagus. Advance 8 cm to ensure  side hole placement within the stomach. 2. Stable bowel gas pattern from the CT Abdomen and Pelvis today. Electronically Signed   By: Genevie Ann M.D.   On: 08/21/2016 16:57     PERTINENT LAB RESULTS: CBC:  Recent Labs  08/27/16 0547  WBC 12.4*  HGB 8.6*  HCT 26.7*  PLT 503*   CMET CMP     Component Value Date/Time   NA 128 (L) 08/27/2016 0547   K 4.5 08/27/2016 0547   CL 89 (L) 08/27/2016 0547   CO2 27 08/27/2016 0547   GLUCOSE  123 (H) 08/27/2016 0547   BUN 12 08/27/2016 0547   CREATININE 0.80 08/27/2016 0547   CALCIUM 9.3 08/27/2016 0547   PROT 6.0 (L) 08/27/2016 0547   ALBUMIN 2.7 (L) 08/27/2016 0547   AST 30 08/27/2016 0547   ALT 14 08/27/2016 0547   ALKPHOS 63 08/27/2016 0547   BILITOT 0.5 08/27/2016 0547   GFRNONAA >60 08/27/2016 0547   GFRAA >60 08/27/2016 0547    GFR Estimated Creatinine Clearance: 43.4 mL/min (by C-G formula based on SCr of 0.8 mg/dL). No results for input(s): LIPASE, AMYLASE in the last 72 hours. No results for input(s): CKTOTAL, CKMB, CKMBINDEX, TROPONINI in the last 72 hours. Invalid input(s): POCBNP No results for input(s): DDIMER in the last 72 hours. No results for input(s): HGBA1C in the last 72 hours. No results for input(s): CHOL, HDL, LDLCALC, TRIG, CHOLHDL, LDLDIRECT in the last 72 hours. No results for input(s): TSH, T4TOTAL, T3FREE, THYROIDAB in the last 72 hours.  Invalid input(s): FREET3 No results for input(s): VITAMINB12, FOLATE, FERRITIN, TIBC, IRON, RETICCTPCT in the last 72 hours. Coags: No results for input(s): INR in the last 72 hours.  Invalid input(s): PT Microbiology: Recent Results (from the past 240 hour(s))  Surgical PCR screen     Status: None   Collection Time: 08/25/16  1:00 PM  Result Value Ref Range Status   MRSA, PCR NEGATIVE NEGATIVE Final   Staphylococcus aureus NEGATIVE NEGATIVE Final    Comment:        The Xpert SA Assay (FDA approved for NASAL specimens in patients over 47 years of age), is  one component of a comprehensive surveillance program.  Test performance has been validated by [facility] for patients greater than or equal to 59 year old. It is not intended to diagnose infection nor to guide or monitor treatment.     FURTHER DISCHARGE INSTRUCTIONS:  Get Medicines reviewed and adjusted: Please take all your medications with you for your next visit with your Primary MD  Laboratory/radiological data: Please request your Primary MD to go over all hospital tests and procedure/radiological results at the follow up, please ask your Primary MD to get all Hospital records sent to his/her office.  In some cases, they will be blood work, cultures and biopsy results pending at the time of your discharge. Please request that your primary care M.D. goes through all the records of your hospital data and follows up on these results.  Also Note the following: If you experience worsening of your admission symptoms, develop shortness of breath, life threatening emergency, suicidal or homicidal thoughts you must seek medical attention immediately by calling 911 or calling your MD immediately  if symptoms less severe.  You must read complete instructions/literature along with all the possible adverse reactions/side effects for all the Medicines you take and that have been prescribed to you. Take any new Medicines after you have completely understood and accpet all the possible adverse reactions/side effects.   Do not drive when taking Pain medications or sleeping medications (Benzodaizepines)  Do not take more than prescribed Pain, Sleep and Anxiety Medications. It is not advisable to combine anxiety,sleep and pain medications without talking with your primary care practitioner  Special Instructions: If you have smoked or chewed Tobacco  in the last 2 yrs please stop smoking, stop any regular Alcohol  and or any Recreational drug use.  Wear Seat belts while driving.  Please  note: You were cared for by a hospitalist during your hospital stay. Once you  are discharged, your primary care physician will handle any further medical issues. Please note that NO REFILLS for any discharge medications will be authorized once you are discharged, as it is imperative that you return to your primary care physician (or establish a relationship with a primary care physician if you do not have one) for your post hospital discharge needs so that they can reassess your need for medications and monitor your lab values.  Total Time spent coordinating discharge including counseling, education and face to face time equals 45 minutes.  SignedOren Binet 08/29/2016 10:52 AM

## 2016-08-29 NOTE — Discharge Instructions (Signed)
Please arrive at least 30 min before your appointment to complete your check in paperwork.  If you are unable to arrive 30 min prior to your appointment time we may have to cancel or reschedule you.  LAPAROSCOPIC SURGERY: POST OP INSTRUCTIONS  1. DIET: Follow a light bland diet the first 24 hours after arrival home, such as soup, liquids, crackers, etc. Be sure to include lots of fluids daily. Avoid fast food or heavy meals as your are more likely to get nauseated. Eat a low fat the next few days after surgery.  2. Take your usually prescribed home medications unless otherwise directed. 3. PAIN CONTROL:  1. Pain is best controlled by a usual combination of three different methods TOGETHER:  1. Ice/Heat 2. Over the counter pain medication 3. Prescription pain medication 2. Most patients will experience some swelling and bruising around the incisions. Ice packs or heating pads (30-60 minutes up to 6 times a day) will help. Use ice for the first few days to help decrease swelling and bruising, then switch to heat to help relax tight/sore spots and speed recovery. Some people prefer to use ice alone, heat alone, alternating between ice & heat. Experiment to what works for you. Swelling and bruising can take several weeks to resolve.  3. It is helpful to take an over-the-counter pain medication regularly for the first few weeks. Choose one of the following that works best for you:  1. Naproxen (Aleve, etc) Two 220mg  tabs twice a day 2. Ibuprofen (Advil, etc) Three 200mg  tabs four times a day (every meal & bedtime) 3. Acetaminophen (Tylenol, etc) 500-650mg  four times a day (every meal & bedtime) 4. A prescription for pain medication (such as oxycodone, hydrocodone, etc) should be given to you upon discharge. Take your pain medication as prescribed.  1. If you are having problems/concerns with the prescription medicine (does not control pain, nausea, vomiting, rash, itching, etc), please call us [phone]  [phone] to see if we need to switch you to a different pain medicine that will work better for you and/or control your side effect better. 2. If you need a refill on your pain medication, please contact your pharmacy. They will contact our office to request authorization. Prescriptions will not be filled after 5 pm or on week-ends. 4. Avoid getting constipated. Between the surgery and the pain medications, it is common to experience some constipation. Increasing fluid intake and taking a fiber supplement (such as Metamucil, Citrucel, FiberCon, MiraLax, etc) 1-2 times a day regularly along with a stool softener will usually help prevent this problem from occurring. A mild laxative (prune juice, Milk of Magnesia, MiraLax, etc) should be taken according to package directions if there are no bowel movements after 48 hours.  5. Watch out for diarrhea. If you have many loose bowel movements, simplify your diet to bland foods & liquids for a few days. Stop any stool softeners and decrease your fiber supplement. Switching to mild anti-diarrheal medications (Kayopectate, Pepto Bismol) can help. If this worsens or does not improve, please call us. 6. Wash / shower every day. You may shower over the dressings as they are waterproof. Continue to shower over incision(s) after the dressing is off. 7. Remove your waterproof bandages 5 days after surgery. You may leave the incision open to air. You may replace a dressing/Band-Aid to cover the incision for comfort if you wish.  8. ACTIVITIES as tolerated:  1. You may resume regular (light) daily activities beginning the next day--such as  daily self-care, walking, climbing stairs--gradually increasing activities as tolerated. If you can walk 30 minutes without difficulty, it is safe to try more intense activity such as jogging, treadmill, bicycling, low-impact aerobics, swimming, etc. 2. Save the most intensive and strenuous activity for last such as sit-ups, heavy lifting,  contact sports, etc Refrain from any heavy lifting or straining until you are off narcotics for pain control.  3. NO LIFTING >15 lbs for 3-4 weeks. 4. DO NOT PUSH THROUGH PAIN. Let pain be your guide: If it hurts to do something, don't do it. Pain is your body warning you to avoid that activity for another week until the pain goes down. 5. You may drive when you are no longer taking prescription pain medication, you can comfortably wear a seatbelt, and you can safely maneuver your car and apply brakes. 6. You may have sexual intercourse when it is comfortable.  9. FOLLOW UP in our office  1. Please call [facility] at [phone] to set up an appointment to see your surgeon in the office for a follow-up appointment approximately 2-3 weeks after your surgery. 2. Make sure that you call for this appointment the day you arrive home to insure a convenient appointment time.      10. IF YOU HAVE DISABILITY OR FAMILY LEAVE FORMS, BRING THEM TO THE               OFFICE FOR PROCESSING.   WHEN TO CALL us [phone]:  1. Poor pain control 2. Reactions / problems with new medications (rash/itching, nausea, etc)  3. Fever over 101.5 F (38.5 C) 4. Inability to urinate 5. Nausea and/or vomiting 6. Worsening swelling or bruising 7. Continued bleeding from incision. 8. Increased pain, redness, or drainage from the incision  The clinic staff is available to answer your questions during regular business hours (8:30am-5pm). Please dont hesitate to call and ask to speak to one of our nurses for clinical concerns.  If you have a medical emergency, go to the nearest emergency room or call 911.  A surgeon from [facility] is always on call at the [facility], [place] [address], [place], [place], [place] [address] ?  MAIN: [phone] ? TOLL FREE: [phone] ?  FAX [phone]) [phone]  [email or web address]

## 2016-08-31 ENCOUNTER — Non-Acute Institutional Stay (SKILLED_NURSING_FACILITY): Payer: Medicare Other | Admitting: Internal Medicine

## 2016-08-31 DIAGNOSIS — K560 Paralytic ileus: Secondary | ICD-10-CM

## 2016-08-31 DIAGNOSIS — D72828 Other elevated white blood cell count: Secondary | ICD-10-CM

## 2016-08-31 DIAGNOSIS — K56609 Unspecified intestinal obstruction, unspecified as to partial versus complete obstruction: Secondary | ICD-10-CM

## 2016-08-31 DIAGNOSIS — E871 Hypo-osmolality and hyponatremia: Secondary | ICD-10-CM | POA: Diagnosis not present

## 2016-08-31 DIAGNOSIS — K5641 Fecal impaction: Secondary | ICD-10-CM
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
## 2016-09-02 LAB — CBC AND DIFFERENTIAL
HCT: 25 % — AB (ref 36–46)
Hemoglobin: 8.3 g/dL — AB (ref 12.0–16.0)
Platelets: 346 10*3/uL (ref 150–399)
WBC: 6.7 10^3/mL

## 2016-09-02 LAB — BASIC METABOLIC PANEL
BUN: 21 mg/dL (ref 4–21)
Creatinine: 0.6 mg/dL (ref 0.5–1.1)
Glucose: 89 mg/dL
Potassium: 4.6 mmol/L (ref 3.4–5.3)
Sodium: 135 mmol/L — AB (ref 137–147)

## 2016-09-02 LAB — HEPATIC FUNCTION PANEL
ALK PHOS: 76 U/L (ref 25–125)
ALT: 8 U/L (ref 7–35)
AST: 11 U/L — AB (ref 13–35)
Bilirubin, Total: 0.2 mg/dL

## 2016-09-03 ENCOUNTER — Encounter: Payer: Self-pay | Admitting: Nurse Practitioner

## 2016-09-03 ENCOUNTER — Non-Acute Institutional Stay (SKILLED_NURSING_FACILITY): Payer: Medicare Other | Admitting: Nurse Practitioner

## 2016-09-03 DIAGNOSIS — K5900 Constipation, unspecified: Secondary | ICD-10-CM

## 2016-09-03 DIAGNOSIS — D62 Acute posthemorrhagic anemia: Secondary | ICD-10-CM | POA: Diagnosis not present

## 2016-09-03 NOTE — Progress Notes (Signed)
Nursing Home Location:  [facility] Room: 309A  Place of Service: SNF (31)  PCP: Maggie Font, MD   Code Status: Full Code  Allergies  Allergen Reactions  . Tape Other (See Comments)    Paper tape - blisters    Chief Complaint  Patient presents with  . Acute Visit    Constipation and anemia    HPI:  Patient is a 71 y.o. female seen today at [facility] at request of nursing for constipation. Pt also with worsening anemia on recent labs. prior history of SBO (most recently in January 2018), dyslipidemia, GERD admitted on 2/7 with abdominal pain. Subsequently found to have a small bowel obstruction and a choledocholithiasis.  Patient underwent laparoscopic cholecystectomy on 2/12-she briefly developed a ileus-but this improved. Pt reported when she first came to [facility] she was not having good BMs, has had a BM daily for the last 4 days but still is distended with slight tenderness. Pt has been eating better with no N/V/D. Reports her belly is larger than normal.  hgb has been trending down since hospitalization, hgb Went from 8.6-8.3 however has ranged from 11.0-8.3 in hospital.  Reports her stool was dark 4 days ago but has been brown since, no frank blood from bowel or bladder, no nose bleeds or abnormal bruising   Review of Systems:  Review of Systems  Constitutional: Negative for activity change, appetite change, fatigue and unexpected weight change.  HENT: Negative for congestion and hearing loss.   Eyes: Negative.   Respiratory: Negative for cough and shortness of breath.   Cardiovascular: Negative for chest pain, palpitations and leg swelling.  Gastrointestinal: Positive for abdominal distention and constipation. Negative for abdominal pain, diarrhea, nausea and vomiting.  Genitourinary: Negative for difficulty urinating and dysuria.  Musculoskeletal: Negative for arthralgias and myalgias.  Skin: Negative for color change and wound.    Neurological: Positive for weakness. Negative for dizziness.  Psychiatric/Behavioral: Negative for agitation, behavioral problems and confusion.    Past Medical History:  Diagnosis Date  . Arthritis    "qwhere" (08/21/2016)  . Aspiration pneumonia ([place]) 07/2016  . Bowel incontinence   . Chronic back pain   . Chronic bronchitis ([place])   . Fluid retention in legs    wears compression stocking R leg  . Gallstones   . GERD (gastroesophageal reflux disease)   . H/O hiatal hernia   . H/O small bowel obstruction 07/2016   admitted to [facility] 1/2 -07/24/16 with SBO which caused aspiration pneumonia and septic shock/notes 08/21/2016  . History of blood transfusion 1975   "had a reaction"  . Incontinence of urine    wears adult briefs  . PONV (postoperative nausea and vomiting)    "doesn't bother me so bad anymore" (08/21/2016)  . Scoliosis   . Seasonal allergies   . Shortness of breath    with pain  . Small bowel obstruction 08/21/2016  . Status post partial amputation of foot, left ([place])   . Weight loss, unintentional    40 lbs in last year   Past Surgical History:  Procedure Laterality Date  . ABDOMINAL HYSTERECTOMY  07/1973   "still have one of my ovaries"  . AMPUTATION  11/07/2011   Procedure: AMPUTATION RAY;  Surgeon: Newt Minion, MD;  Location: [place];  Service: Orthopedics;  Laterality: Left;  Left Foot 1st Ray Amputation  . APPENDECTOMY    . BLADDER REPAIR  ~ 02/1974   "hole in my bladder &  a place that hadn't healed up from 07/1973 OR for hysterectomy"  . BREAST LUMPECTOMY Bilateral 07/1987   "benign"  . CATARACT EXTRACTION W/ INTRAOCULAR LENS  IMPLANT, BILATERAL Bilateral   . CHOLECYSTECTOMY N/A 08/26/2016   Procedure: LAPAROSCOPIC CHOLECYSTECTOMY;  Surgeon: Georganna Skeans, MD;  Location: [place];  Service: General;  Laterality: N/A;  . DOBUTAMINE STRESS ECHO    . ERCP N/A 08/24/2016   Procedure: ENDOSCOPIC RETROGRADE CHOLANGIOPANCREATOGRAPHY (ERCP);  Surgeon: Milus Banister, MD;   Location: [place];  Service: Endoscopy;  Laterality: N/A;  . EXPLORATORY LAPAROTOMY  07/1973   "to straighten out my guts 7 days after hysterectomy"  . KNEE ARTHROSCOPY Left   . LAPAROSCOPIC LYSIS OF ADHESIONS N/A 08/26/2016   Procedure: LAPAROSCOPIC LYSIS OF ADHESIONS FOR 20 MINUTES;  Surgeon: Georganna Skeans, MD;  Location: [place];  Service: General;  Laterality: N/A;  . TONSILLECTOMY AND ADENOIDECTOMY     Social History:   reports that she quit smoking about 7 weeks ago. Her smoking use included Cigarettes. She has a 54.00 pack-year smoking history. She has never used smokeless tobacco. She reports that she drinks alcohol. She reports that she does not use drugs.  Family History  Problem Relation Age of Onset  . Hypertension Mother   . Cancer Father   . Diabetes Neg Hx   . Stroke Neg Hx     Medications: Patient's Medications  New Prescriptions   No medications on file  Previous Medications   ACETAMINOPHEN (TYLENOL) 325 MG TABLET    Take 650 mg by mouth every 6 (six) hours as needed for mild pain.   AMBULATORY NON FORMULARY MEDICATION    Give 120 ml MedPass by mouth twice daily.   BISACODYL (DULCOLAX) 10 MG SUPPOSITORY    Place 10 mg rectally as needed for moderate constipation.   CALCIUM CARBONATE (TUMS - DOSED IN MG ELEMENTAL CALCIUM) 500 MG CHEWABLE TABLET    Chew 1 tablet by mouth as needed for indigestion or heartburn.   DIPHENHYDRAMINE (BENADRYL) 25 MG TABLET    Take 25 mg by mouth every 6 (six) hours as needed for allergies.   DOCUSATE CALCIUM (STOOL SOFTENER PO)    Take 1 tablet by mouth daily.   GABAPENTIN (NEURONTIN) 300 MG CAPSULE    Take 300 mg by mouth 3 (three) times daily.    LORATADINE (CLARITIN) 10 MG TABLET    Take 10 mg by mouth daily as needed for allergies.   MULTIPLE VITAMIN (MULTIVITAMIN WITH MINERALS) TABS TABLET    Take 1 tablet by mouth daily.   OXYBUTYNIN (DITROPAN) 5 MG TABLET    Take 5 mg by mouth 2 (two) times daily.   PANTOPRAZOLE (PROTONIX) 40 MG  TABLET    Take 1 tablet (40 mg total) by mouth 2 (two) times daily.   POLYETHYLENE GLYCOL (MIRALAX / GLYCOLAX) PACKET    Take 17 g by mouth daily. Hold for loose stool   PRAVASTATIN (PRAVACHOL) 20 MG TABLET    Take 20 mg by mouth daily.    SIMETHICONE (MYLICON) 0000000 MG CHEWABLE TABLET    Chew 125 mg by mouth every 6 (six) hours as needed for flatulence.   TRAMADOL (ULTRAM) 50 MG TABLET    Take 1 tablet (50 mg total) by mouth every 6 (six) hours as needed for moderate pain.  Modified Medications   No medications on file  Discontinued Medications   ENSURE PLUS (ENSURE PLUS) LIQD    Take 237 mLs by mouth daily.  Physical Exam: Vitals:   09/03/16 1353  BP: 121/79  Pulse: 71  Resp: 18  Temp: 98 F (36.7 C)  SpO2: 96%  Weight: 110 lb (49.9 kg)  Height: 4\' 11"  (1.499 m)    Physical Exam  Constitutional: She is oriented to person, place, and time. She appears well-developed and well-nourished. No distress.  Frail thin female.   HENT:  Head: Normocephalic and atraumatic.  Mouth/Throat: Oropharynx is clear and moist. No oropharyngeal exudate.  Eyes: Conjunctivae are normal. Pupils are equal, round, and reactive to light.  Neck: Normal range of motion. Neck supple.  Cardiovascular: Normal rate, regular rhythm and normal heart sounds.   Pulmonary/Chest: Effort normal and breath sounds normal.  Abdominal: Soft. She exhibits distension. Bowel sounds are decreased. There is tenderness.  Musculoskeletal: She exhibits no edema or tenderness.  Generalized weakness  Neurological: She is alert and oriented to person, place, and time.  Skin: Skin is warm and dry. She is not diaphoretic.  Psychiatric: She has a normal mood and affect.    Labs reviewed: Basic Metabolic Panel:  Recent Labs  07/19/16 0508  07/21/16 0345 07/23/16 0407  08/25/16 0308 08/26/16 0811 08/27/16 0547 09/02/16  NA 138  < > 134* 133*  < > 130* 133* 128* 135*  K 4.0  < > 3.8 3.7  < > 4.4 4.3 4.5 4.6  CL 108  <  > 104 99*  < > 94* 93* 89*  --   CO2 23  < > 23 27  < > 29 30 27   --   GLUCOSE 92  < > 103* 100*  < > 109* 104* 123*  --   BUN 15  < > 13 13  < > 12 14 12 21   CREATININE 0.66  < > 0.50 0.58  < > 0.80 0.69 0.80 0.6  CALCIUM 7.6*  < > 7.6* 8.0*  < > 9.2 9.5 9.3  --   MG 1.9  --  1.7 1.7  --   --   --   --   --   PHOS 1.5*  --  2.0* 2.3*  --   --   --   --   --   < > = values in this interval not displayed. Liver Function Tests:  Recent Labs  08/23/16 0554 08/26/16 0811 08/27/16 0547 09/02/16  AST 20 20 30  11*  ALT 11* 12* 14 8  ALKPHOS 64 86 63 76  BILITOT 0.8 0.5 0.5  --   PROT 5.6* 6.6 6.0*  --   ALBUMIN 2.3* 2.9* 2.7*  --     Recent Labs  06/27/16 1310 08/21/16 1005  LIPASE 15 22   No results for input(s): AMMONIA in the last 8760 hours. CBC:  Recent Labs  07/16/16 1644  07/20/16 0507  08/23/16 0554 08/25/16 0308 08/27/16 0547 09/02/16  WBC 17.2*  < > 13.5*  < > 7.3 13.8* 12.4* 6.7  NEUTROABS 15.6*  --  11.8*  --   --   --   --   --   HGB 8.7*  < > 9.4*  < > 8.6* 8.3* 8.6* 8.3*  HCT 24.4*  < > 27.6*  < > 26.9* 26.0* 26.7* 25*  MCV 98.0  < > 93.9  < > 98.5 97.0 96.7  --   PLT 315  < > 205  < > 582* 516* 503* 346  < > = values in this interval not displayed. TSH: No results for input(s): TSH in  the last 8760 hours. A1C: No results found for: HGBA1C Lipid Panel: No results for input(s): CHOL, HDL, LDLCALC, TRIG, CHOLHDL, LDLDIRECT in the last 8760 hours.  Radiological Exams: Ct Abdomen Pelvis W Contrast  Result Date: 08/21/2016 CLINICAL DATA:  Abdominal pain and distention.  Constipation. EXAM: CT ABDOMEN AND PELVIS WITH CONTRAST TECHNIQUE: Multidetector CT imaging of the abdomen and pelvis was performed using the standard protocol following bolus administration of intravenous contrast. CONTRAST:  149mL ISOVUE-300 IOPAMIDOL (ISOVUE-300) INJECTION 61% COMPARISON:  07/16/2016 FINDINGS: Lower chest: Mild atelectasis in both lower lobes. No pleural or pericardial  fluid. Small hiatal hernia. Hepatobiliary: No liver parenchymal abnormality is seen. There is intra and extrahepatic biliary ductal dilatation. There are multiple stones in the gallbladder. There appears to be a filling defect in the distal common duct, non calcified, most consistent with a passing stone. Pancreas: Normal Spleen: Normal Adrenals/Urinary Tract: Normal appearance of the kidneys except for vascular calcification. Adrenal glands are normal. Stomach/Bowel: Markedly dilated fluid filled loops of small intestine consistent with small bowel obstruction. The colon is essentially collapsed. Few loops of collapsed ileum are noted. Vascular/Lymphatic: Aortic atherosclerosis. No aneurysm. IVC is normal. Reproductive: Previous hysterectomy.  No pelvic mass. Other: Small amount of free fluid.  No free air. Musculoskeletal: Curvature in chronic degenerative change of the spine. Superior endplate fracture of L2 is chronic. IMPRESSION: High-grade small bowel obstruction. Small amount of free fluid. No free air. Chololithiasis. Intra and extra hepatic biliary ductal dilatation. Probable choledocholithiasis with a stone or stones in the distal duct. Electronically Signed   By: Nelson Chimes M.D.   On: 08/21/2016 13:59   Dg Chest Port 1 View  Result Date: 08/21/2016 CLINICAL DATA:  Preoperative respiratory evaluation. EXAM: PORTABLE CHEST 1 VIEW COMPARISON:  08/21/2016, earlier the same day. FINDINGS: The lungs are clear wiithout focal pneumonia, edema, pneumothorax or pleural effusion. The cardiopericardial silhouette is within normal limits for size. NG tube tip is in the proximal stomach with proximal port of the NG tube in the distal esophagus. Bones are diffusely demineralized. IMPRESSION: 1. No acute cardiopulmonary findings. 2. The tip of the NG tube is just barely into the stomach with proximal port in the esophagus. Electronically Signed   By: Misty Stanley M.D.   On: 08/21/2016 18:05   Dg Abdomen Acute  W/chest  Result Date: 08/21/2016 CLINICAL DATA:  Pain and abdominal distention for 2 days with nausea. EXAM: DG ABDOMEN ACUTE W/ 1V CHEST COMPARISON:  Chest radiograph 07/19/2016. FINDINGS: Frontal view of the chest shows midline trachea and normal heart size. Thoracic aorta is calcified. Biapical pleuroparenchymal scarring. No airspace consolidation or pleural fluid. There is marked gaseous distention of colon with a fair amount of stool and air-fluid levels. Gas is seen in the rectosigmoid colon. There may be bowel gas extending into the right inguinal canal. No definite small bowel dilatation. Dextroconvex scoliosis, centered near the thoracolumbar junction, with degenerative changes. IMPRESSION: 1. Bowel gas pattern is indicative of a colonic ileus. Distal colorectal obstruction cannot be excluded. 2. There may be bowel gas extending into the right inguinal canal. Difficult to exclude a hernia. 3. CT abdomen pelvis with contrast may be helpful in further evaluation, as clinically indicated. Electronically Signed   By: Lorin Picket M.D.   On: 08/21/2016 13:34   Dg Abd Portable 1v-small Bowel Obstruction Protocol-initial, 8 Hr Delay  Result Date: 08/22/2016 CLINICAL DATA:  Small bowel obstruction.  8 hour delayed film. EXAM: PORTABLE ABDOMEN - 1 VIEW COMPARISON:  08/21/2016 FINDINGS: Enteric tube tip is in the left upper quadrant consistent with location in the stomach. The tube is kinked at the proximal side hole. Positioning is not significantly changed since prior study. Contrast material is demonstrated throughout the colon and into the rectum. Presence of contrast material in the colon and mild gaseous distention of small bowel suggests partial obstruction or ileus. IMPRESSION: Contrast material demonstrated throughout the colon. Electronically Signed   By: Lucienne Capers M.D.   On: 08/22/2016 06:24   Dg Abd Portable 1v  Result Date: 08/21/2016 CLINICAL DATA:  Nasogastric tube placement. EXAM:  PORTABLE ABDOMEN - 1 VIEW COMPARISON:  Radiographs of same day. FINDINGS: Distal tip of nasogastric tube is seen in proximal stomach. Dilated small bowel loops are again noted. IMPRESSION: Distal tip of nasogastric tube is seen in expected position of proximal stomach. Dilated small bowel loops are noted concerning for distal small bowel obstruction. Electronically Signed   By: Marijo Conception, M.D.   On: 08/21/2016 20:28   Dg Abd Portable 1 View  Result Date: 08/21/2016 CLINICAL DATA:  71 year old female enteric tube placement. Initial encounter. EXAM: PORTABLE ABDOMEN - 1 VIEW COMPARISON:  CT Abdomen and Pelvis 1342 hours today. FINDINGS: Portable AP supine view at 1646 hours. Enteric tube courses to the stomach. The side hole is in the distal thoracic esophagus. Negative lung bases. Small volume of excreted IV contrast in the renal collecting systems stable bowel gas pattern. IMPRESSION: 1. Enteric tube side hole in the distal thoracic esophagus. Advance 8 cm to ensure side hole placement within the stomach. 2. Stable bowel gas pattern from the CT Abdomen and Pelvis today. Electronically Signed   By: Genevie Ann M.D.   On: 08/21/2016 16:57    Assessment/Plan 1. Constipation, unspecified constipation type With hx of ileus will follow up KUB Will increase miralax to BID, to cont docusate daily   2. Acute blood loss anemia Will have staff hemoccult stools x 3, will follow up CBC and iron on 2/23  3. OAB Will dc oxybutynin which can cause worsening constipation. Discussed with pt about trying alternative and she would just rather stop medication and see if she needs anything else. Will monitor for increase in urination and if needed will reevaluate.    Carlos American. Harle Battiest  [facility] [phone] 8 am - 5 pm) [phone] (after hours)

## 2016-09-06 ENCOUNTER — Other Ambulatory Visit: Payer: Self-pay | Admitting: *Deleted

## 2016-09-06 NOTE — Patient Outreach (Signed)
[facility]) Care Management  09/06/2016  Ashley Howard Apr 08, 1946 [id number]   Met with [name], SW at facility regarding patient discharge planning needs. Patient is eligible for [facility] care management services. SW reports that patient is there under Skilled, but discharge plan is for ALF.  Instructed [name] to re-consult RNCM if patient discharge plans or needs change. She agrees.   Plan to sign off at this time.  Royetta Crochet. Laymond Purser, RN, BSN, [place] [phone]) Business Cell  [phone]) Toll Free Office

## 2016-09-17 ENCOUNTER — Encounter: Payer: Self-pay | Admitting: Internal Medicine

## 2016-09-17 ENCOUNTER — Non-Acute Institutional Stay (SKILLED_NURSING_FACILITY): Payer: Medicare Other | Admitting: Internal Medicine

## 2016-09-17 DIAGNOSIS — R195 Other fecal abnormalities: Secondary | ICD-10-CM | POA: Diagnosis not present

## 2016-09-17 DIAGNOSIS — J309 Allergic rhinitis, unspecified: Secondary | ICD-10-CM

## 2016-09-17 DIAGNOSIS — R630 Anorexia: Secondary | ICD-10-CM | POA: Diagnosis not present

## 2016-09-17 NOTE — Patient Instructions (Signed)
See assessment and plan under each diagnosis acutely for this visit  

## 2016-09-17 NOTE — Progress Notes (Signed)
Facility Location: [facility]  Room Number: [id number]  Code Status: Full Code   This is a nursing facility follow up for specific acute issue of anorexia.  Interim medical record and care since last [place] visit was updated with review of diagnostic studies and change in clinical status since last visit were documented.  HPI: Poor appetite actually is improved according to the patient. She feels improvement began when she was placed on liquid diet.  The patient had been hospitalized 2/7-2/15/18 with recurrent small bowel obstruction and choledocholithiasis. The patient underwent laparoscopic cholecystectomy on [id number], complicated by postop ileus. She has GERD and has been continued on PPI. She has a mild anemia with a hemoglobin averaging approximately 9. Supplements were prescribed by nutrition for severe protein/caloric malnutrition.  She was seen by the general surgery PA today. He questions possible motility disorder and scheduled  follow-up appointment with Dr. Grandville Silos, general surgery and recommended referral to Dr. Ardis Hughs, gastroenterology for evaluation of the possible motility disorder. He did discontinue her stool softener & Dulcolax. He recommended continuing MiraLAX twice a day. At this time the patient complains of frequent loose stools with no definite form. There is no frank diarrhea. She describes increased gas and bloating. She states that she had adhesions following a hysterectomy and feels this may be playing a role.  Review of systems: She describes chronic low back pain she feels may be related to her intestinal problems. She describes frequent urination and urgency. This is a chronic problem for which she is followed by urology.  She also describes extrinsic symptoms with rhinitis.  Constitutional: No fever,significant weight change, fatigue  Eyes: No redness, discharge, pain, vision change ENT/mouth: No nasal congestion,   purulent discharge, earache,change in hearing ,sore throat  Cardiovascular: No chest pain, palpitations,paroxysmal nocturnal dyspnea, claudication, edema  Respiratory: No cough, sputum production,hemoptysis, DOE , significant snoring,apnea   Gastrointestinal: No heartburn,dysphagia, nausea / vomiting,rectal bleeding, melena Genitourinary: No dysuria,hematuria, pyuria, nocturia Musculoskeletal: No joint stiffness, joint swelling, weakness,pain Dermatologic: No rash, pruritus, change in appearance of skin Neurologic: No dizziness,headache,syncope, seizures, numbness , tingling Psychiatric: No significant anxiety , depression, insomnia, anorexia Endocrine: No change in hair/skin/ nails, excessive thirst, excessive hunger, excessive urination  Hematologic/lymphatic: No significant bruising, lymphadenopathy,abnormal bleeding Allergy/immunology: No itchy/ watery eyes,  urticaria, angioedema  Physical exam:  Pertinent or positive findings:Her answers are somewhat rambling but she is oriented.  She appears younger than her stated age.  She was noted to be eating extremely well at this time.  Abdomen is protuberant with hyperactive bowel sounds. She describes slight tenderness to palpation over the lower abdomen.  She has nonpitting edema. Pedal pulses are decreased. Her gait is slow and broad.  General appearance:thin but adequately nourished; no acute distress , increased work of breathing is present.   Lymphatic: No lymphadenopathy about the head, neck, axilla . Eyes: No conjunctival inflammation or lid edema is present. There is no scleral icterus. Ears:  External ear exam shows no significant lesions or deformities.   Nose:  External nasal examination shows no deformity or inflammation. Nasal mucosa are pink and moist without lesions ,exudates Oral exam: lips and gums are healthy appearing.There is no oropharyngeal erythema or exudate . Neck:  No thyromegaly, masses, tenderness noted.      Heart:  Normal rate and regular rhythm. S1 and S2 normal without gallop, murmur, click, rub .  Lungs:Chest clear to auscultation without wheezes, rhonchi,rales , rubs. GU deferred  Extremities:  No cyanosis, clubbing Skin: Warm & dry w/o tenting. No significant lesions or rash.   #1  anorexia, resolved #2 loose bowels, rule out motility disorder #3 extrinsic rhinitis #4 chronic LBP Plan:Trial of Florastor  Trial of Meloxicam

## 2016-09-18 LAB — CBC AND DIFFERENTIAL
HEMATOCRIT: 29 % — AB (ref 36–46)
Hemoglobin: 9.4 g/dL — AB (ref 12.0–16.0)
Platelets: 311 10*3/uL (ref 150–399)
WBC: 8.4 10^3/mL

## 2016-09-18 LAB — BASIC METABOLIC PANEL
BUN: 17 mg/dL (ref 4–21)
Creatinine: 0.6 mg/dL (ref 0.5–1.1)
Glucose: 91 mg/dL
Potassium: 4.8 mmol/L (ref 3.4–5.3)
SODIUM: 132 mmol/L — AB (ref 137–147)

## 2016-09-26 DIAGNOSIS — M419 Scoliosis, unspecified: Secondary | ICD-10-CM | POA: Diagnosis not present

## 2016-09-26 DIAGNOSIS — D649 Anemia, unspecified: Secondary | ICD-10-CM | POA: Diagnosis not present

## 2016-09-26 DIAGNOSIS — M6281 Muscle weakness (generalized): Secondary | ICD-10-CM | POA: Diagnosis not present

## 2016-09-26 DIAGNOSIS — M1990 Unspecified osteoarthritis, unspecified site: Secondary | ICD-10-CM | POA: Diagnosis not present

## 2016-09-26 DIAGNOSIS — R488 Other symbolic dysfunctions: Secondary | ICD-10-CM | POA: Diagnosis not present

## 2016-09-26 DIAGNOSIS — I5032 Chronic diastolic (congestive) heart failure: Secondary | ICD-10-CM | POA: Diagnosis not present

## 2016-09-26 DIAGNOSIS — G8929 Other chronic pain: Secondary | ICD-10-CM | POA: Diagnosis not present

## 2016-09-26 DIAGNOSIS — M792 Neuralgia and neuritis, unspecified: Secondary | ICD-10-CM | POA: Diagnosis not present

## 2016-09-30 DIAGNOSIS — M6281 Muscle weakness (generalized): Secondary | ICD-10-CM | POA: Diagnosis not present

## 2016-09-30 DIAGNOSIS — I5032 Chronic diastolic (congestive) heart failure: Secondary | ICD-10-CM | POA: Diagnosis not present

## 2016-09-30 DIAGNOSIS — M792 Neuralgia and neuritis, unspecified: Secondary | ICD-10-CM | POA: Diagnosis not present

## 2016-09-30 DIAGNOSIS — G8929 Other chronic pain: Secondary | ICD-10-CM | POA: Diagnosis not present

## 2016-09-30 DIAGNOSIS — R488 Other symbolic dysfunctions: Secondary | ICD-10-CM | POA: Diagnosis not present

## 2016-09-30 DIAGNOSIS — M419 Scoliosis, unspecified: Secondary | ICD-10-CM | POA: Diagnosis not present

## 2016-09-30 DIAGNOSIS — D649 Anemia, unspecified: Secondary | ICD-10-CM | POA: Diagnosis not present

## 2016-09-30 DIAGNOSIS — M1990 Unspecified osteoarthritis, unspecified site: Secondary | ICD-10-CM | POA: Diagnosis not present

## 2016-10-02 DIAGNOSIS — M419 Scoliosis, unspecified: Secondary | ICD-10-CM | POA: Diagnosis not present

## 2016-10-02 DIAGNOSIS — I5032 Chronic diastolic (congestive) heart failure: Secondary | ICD-10-CM | POA: Diagnosis not present

## 2016-10-02 DIAGNOSIS — M1990 Unspecified osteoarthritis, unspecified site: Secondary | ICD-10-CM | POA: Diagnosis not present

## 2016-10-02 DIAGNOSIS — D649 Anemia, unspecified: Secondary | ICD-10-CM | POA: Diagnosis not present

## 2016-10-02 DIAGNOSIS — M6281 Muscle weakness (generalized): Secondary | ICD-10-CM | POA: Diagnosis not present

## 2016-10-02 DIAGNOSIS — M792 Neuralgia and neuritis, unspecified: Secondary | ICD-10-CM | POA: Diagnosis not present

## 2016-10-02 DIAGNOSIS — R488 Other symbolic dysfunctions: Secondary | ICD-10-CM | POA: Diagnosis not present

## 2016-10-02 DIAGNOSIS — G8929 Other chronic pain: Secondary | ICD-10-CM | POA: Diagnosis not present

## 2016-10-03 DIAGNOSIS — G8929 Other chronic pain: Secondary | ICD-10-CM | POA: Diagnosis not present

## 2016-10-03 DIAGNOSIS — M6281 Muscle weakness (generalized): Secondary | ICD-10-CM | POA: Diagnosis not present

## 2016-10-03 DIAGNOSIS — M419 Scoliosis, unspecified: Secondary | ICD-10-CM | POA: Diagnosis not present

## 2016-10-03 DIAGNOSIS — R488 Other symbolic dysfunctions: Secondary | ICD-10-CM | POA: Diagnosis not present

## 2016-10-03 DIAGNOSIS — D649 Anemia, unspecified: Secondary | ICD-10-CM | POA: Diagnosis not present

## 2016-10-03 DIAGNOSIS — M1990 Unspecified osteoarthritis, unspecified site: Secondary | ICD-10-CM | POA: Diagnosis not present

## 2016-10-03 DIAGNOSIS — I5032 Chronic diastolic (congestive) heart failure: Secondary | ICD-10-CM | POA: Diagnosis not present

## 2016-10-03 DIAGNOSIS — M792 Neuralgia and neuritis, unspecified: Secondary | ICD-10-CM | POA: Diagnosis not present

## 2016-10-04 DIAGNOSIS — M1990 Unspecified osteoarthritis, unspecified site: Secondary | ICD-10-CM | POA: Diagnosis not present

## 2016-10-04 DIAGNOSIS — M792 Neuralgia and neuritis, unspecified: Secondary | ICD-10-CM | POA: Diagnosis not present

## 2016-10-04 DIAGNOSIS — G8929 Other chronic pain: Secondary | ICD-10-CM | POA: Diagnosis not present

## 2016-10-04 DIAGNOSIS — R488 Other symbolic dysfunctions: Secondary | ICD-10-CM | POA: Diagnosis not present

## 2016-10-04 DIAGNOSIS — M6281 Muscle weakness (generalized): Secondary | ICD-10-CM | POA: Diagnosis not present

## 2016-10-04 DIAGNOSIS — I5032 Chronic diastolic (congestive) heart failure: Secondary | ICD-10-CM | POA: Diagnosis not present

## 2016-10-04 DIAGNOSIS — M419 Scoliosis, unspecified: Secondary | ICD-10-CM | POA: Diagnosis not present

## 2016-10-04 DIAGNOSIS — D649 Anemia, unspecified: Secondary | ICD-10-CM | POA: Diagnosis not present

## 2016-10-07 DIAGNOSIS — M792 Neuralgia and neuritis, unspecified: Secondary | ICD-10-CM | POA: Diagnosis not present

## 2016-10-07 DIAGNOSIS — I5032 Chronic diastolic (congestive) heart failure: Secondary | ICD-10-CM | POA: Diagnosis not present

## 2016-10-07 DIAGNOSIS — M1990 Unspecified osteoarthritis, unspecified site: Secondary | ICD-10-CM | POA: Diagnosis not present

## 2016-10-07 DIAGNOSIS — D649 Anemia, unspecified: Secondary | ICD-10-CM | POA: Diagnosis not present

## 2016-10-07 DIAGNOSIS — M6281 Muscle weakness (generalized): Secondary | ICD-10-CM | POA: Diagnosis not present

## 2016-10-07 DIAGNOSIS — R488 Other symbolic dysfunctions: Secondary | ICD-10-CM | POA: Diagnosis not present

## 2016-10-07 DIAGNOSIS — G8929 Other chronic pain: Secondary | ICD-10-CM | POA: Diagnosis not present

## 2016-10-07 DIAGNOSIS — M419 Scoliosis, unspecified: Secondary | ICD-10-CM | POA: Diagnosis not present

## 2016-10-08 ENCOUNTER — Encounter: Payer: Self-pay | Admitting: Gastroenterology

## 2016-10-08 ENCOUNTER — Ambulatory Visit (INDEPENDENT_AMBULATORY_CARE_PROVIDER_SITE_OTHER): Payer: Medicare Other | Admitting: Gastroenterology

## 2016-10-08 ENCOUNTER — Encounter (INDEPENDENT_AMBULATORY_CARE_PROVIDER_SITE_OTHER): Payer: Self-pay

## 2016-10-08 VITALS — BP 140/72 | HR 56 | Ht 59.0 in | Wt 104.2 lb

## 2016-10-08 DIAGNOSIS — R488 Other symbolic dysfunctions: Secondary | ICD-10-CM | POA: Diagnosis not present

## 2016-10-08 DIAGNOSIS — I5032 Chronic diastolic (congestive) heart failure: Secondary | ICD-10-CM | POA: Diagnosis not present

## 2016-10-08 DIAGNOSIS — M419 Scoliosis, unspecified: Secondary | ICD-10-CM | POA: Diagnosis not present

## 2016-10-08 DIAGNOSIS — M1990 Unspecified osteoarthritis, unspecified site: Secondary | ICD-10-CM | POA: Diagnosis not present

## 2016-10-08 DIAGNOSIS — K56609 Unspecified intestinal obstruction, unspecified as to partial versus complete obstruction: Secondary | ICD-10-CM | POA: Diagnosis not present

## 2016-10-08 DIAGNOSIS — G8929 Other chronic pain: Secondary | ICD-10-CM | POA: Diagnosis not present

## 2016-10-08 DIAGNOSIS — R159 Full incontinence of feces: Secondary | ICD-10-CM | POA: Diagnosis not present

## 2016-10-08 DIAGNOSIS — M792 Neuralgia and neuritis, unspecified: Secondary | ICD-10-CM | POA: Diagnosis not present

## 2016-10-08 DIAGNOSIS — K8050 Calculus of bile duct without cholangitis or cholecystitis without obstruction: Secondary | ICD-10-CM

## 2016-10-08 DIAGNOSIS — D649 Anemia, unspecified: Secondary | ICD-10-CM | POA: Diagnosis not present

## 2016-10-08 DIAGNOSIS — M6281 Muscle weakness (generalized): Secondary | ICD-10-CM | POA: Diagnosis not present

## 2016-10-08 NOTE — Patient Instructions (Signed)
If you are age 73 or older, your body mass index should be between 23-30. Your Body mass index is 21.05 kg/m. If this is out of the aforementioned range listed, please consider follow up with your Primary Care Provider.  If you are age 61 or younger, your body mass index should be between 19-25. Your Body mass index is 21.05 kg/m. If this is out of the aformentioned range listed, please consider follow up with your Primary Care Provider.   Thank you for choosing [facility]  Dr Wilfrid Lund III

## 2016-10-08 NOTE — Progress Notes (Signed)
     [facility] Progress Note  Chief Complaint: Fecal incontinence  Subjective  History:  This is follow-up for 71 year old woman I saw about 6 months ago. As before, she is a very poor historian with a tangential and disordered thought process. Again, she is not accompanied by any staph or family members. I saw her for fecal incontinence before. She reports this problem is improved on a bowel regimen of MiraLAX and stool softeners. Chart review shows that she was admitted with a small bowel obstruction in January 2018 requiring lysis of adhesions.  She was then admitted February 2018 with a CBD stone requiring ERCP by Dr. Ardis Hughs, then she underwent cholecystectomy by Dr. Grandville Silos.  She reports that her appetite is good and she thinks her weight is stable. There's been no rectal bleeding.  ROS: Cardiovascular:  no chest pain Respiratory: no dyspnea Beyond that, no helpful review of systems can be obtained as noted above   The patient's Past Medical, Family and Social History were reviewed and are on file in the EMR.  Objective:  Med list reviewed  Vital signs in last 24 hrs: Vitals:   10/08/16 1105  BP: 140/72  Pulse: (!) 56    Physical Exam    HEENT: sclera anicteric, oral mucosa moist without lesions  Cardiac: RRR without murmurs, S1S2 heard, no peripheral edema  Pulm: clear to auscultation bilaterally, normal RR and effort noted  Abdomen: soft, No  tenderness, with active bowel sounds. No guarding or palpable hepatosplenomegaly.  Skin; warm and dry, no jaundice or rash  Recent Labs:  CTAP from January and February reports reviewed and are available in Epic  Radiologic studies:  CMP Latest Ref Rng & Units 09/18/2016 09/02/2016 08/27/2016  Glucose 65 - 99 mg/dL - - 123(H)  BUN 4 - 21 mg/dL 17 21 12   Creatinine 0.5 - 1.1 mg/dL 0.6 0.6 0.80  Sodium 137 - 147 mmol/L 132(A) 135(A) 128(L)  Potassium 3.4 - 5.3 mmol/L 4.8 4.6 4.5  Chloride 101 - 111 mmol/L -  - 89(L)  CO2 22 - 32 mmol/L - - 27  Calcium 8.9 - 10.3 mg/dL - - 9.3  Total Protein 6.5 - 8.1 g/dL - - 6.0(L)  Total Bilirubin 0.3 - 1.2 mg/dL - - 0.5  Alkaline Phos 25 - 125 U/L - 76 63  AST 13 - 35 U/L - 11(A) 30  ALT 7 - 35 U/L - 8 14     @ASSESSMENTPLANBEGIN @ Assessment: Encounter Diagnoses  Name Primary?  . Incontinence of feces, unspecified fecal incontinence type Yes  . SBO (small bowel obstruction)   . Choledocholithiasis     I done a rectal exam on this patient in the past, she has poor anorectal sensation and decreased function which is the cause of her fecal incontinence. I do not think a colonoscopy would likely be revealing in this case. Neither CT scan has revealed any mass lesion of the colon.  Plan: Continue bowel regimen and see me as needed   Total time 15 minutes, over half spent in counseling and coordination of care.   Nelida Meuse III

## 2016-10-09 DIAGNOSIS — R488 Other symbolic dysfunctions: Secondary | ICD-10-CM | POA: Diagnosis not present

## 2016-10-09 DIAGNOSIS — I5032 Chronic diastolic (congestive) heart failure: Secondary | ICD-10-CM | POA: Diagnosis not present

## 2016-10-09 DIAGNOSIS — G8929 Other chronic pain: Secondary | ICD-10-CM | POA: Diagnosis not present

## 2016-10-09 DIAGNOSIS — D649 Anemia, unspecified: Secondary | ICD-10-CM | POA: Diagnosis not present

## 2016-10-09 DIAGNOSIS — M1990 Unspecified osteoarthritis, unspecified site: Secondary | ICD-10-CM | POA: Diagnosis not present

## 2016-10-09 DIAGNOSIS — M6281 Muscle weakness (generalized): Secondary | ICD-10-CM | POA: Diagnosis not present

## 2016-10-09 DIAGNOSIS — M792 Neuralgia and neuritis, unspecified: Secondary | ICD-10-CM | POA: Diagnosis not present

## 2016-10-09 DIAGNOSIS — M419 Scoliosis, unspecified: Secondary | ICD-10-CM | POA: Diagnosis not present

## 2016-10-10 DIAGNOSIS — M419 Scoliosis, unspecified: Secondary | ICD-10-CM | POA: Diagnosis not present

## 2016-10-10 DIAGNOSIS — I5032 Chronic diastolic (congestive) heart failure: Secondary | ICD-10-CM | POA: Diagnosis not present

## 2016-10-10 DIAGNOSIS — G8929 Other chronic pain: Secondary | ICD-10-CM | POA: Diagnosis not present

## 2016-10-10 DIAGNOSIS — M792 Neuralgia and neuritis, unspecified: Secondary | ICD-10-CM | POA: Diagnosis not present

## 2016-10-10 DIAGNOSIS — M6281 Muscle weakness (generalized): Secondary | ICD-10-CM | POA: Diagnosis not present

## 2016-10-10 DIAGNOSIS — R488 Other symbolic dysfunctions: Secondary | ICD-10-CM | POA: Diagnosis not present

## 2016-10-10 DIAGNOSIS — M1990 Unspecified osteoarthritis, unspecified site: Secondary | ICD-10-CM | POA: Diagnosis not present

## 2016-10-10 DIAGNOSIS — D649 Anemia, unspecified: Secondary | ICD-10-CM | POA: Diagnosis not present

## 2016-10-15 DIAGNOSIS — M6281 Muscle weakness (generalized): Secondary | ICD-10-CM | POA: Diagnosis not present

## 2016-10-15 DIAGNOSIS — M419 Scoliosis, unspecified: Secondary | ICD-10-CM | POA: Diagnosis not present

## 2016-10-15 DIAGNOSIS — D649 Anemia, unspecified: Secondary | ICD-10-CM | POA: Diagnosis not present

## 2016-10-15 DIAGNOSIS — M1990 Unspecified osteoarthritis, unspecified site: Secondary | ICD-10-CM | POA: Diagnosis not present

## 2016-10-15 DIAGNOSIS — G8929 Other chronic pain: Secondary | ICD-10-CM | POA: Diagnosis not present

## 2016-10-15 DIAGNOSIS — M792 Neuralgia and neuritis, unspecified: Secondary | ICD-10-CM | POA: Diagnosis not present

## 2016-10-15 DIAGNOSIS — R488 Other symbolic dysfunctions: Secondary | ICD-10-CM | POA: Diagnosis not present

## 2016-10-15 DIAGNOSIS — I5032 Chronic diastolic (congestive) heart failure: Secondary | ICD-10-CM | POA: Diagnosis not present

## 2016-10-17 DIAGNOSIS — M792 Neuralgia and neuritis, unspecified: Secondary | ICD-10-CM | POA: Diagnosis not present

## 2016-10-17 DIAGNOSIS — D649 Anemia, unspecified: Secondary | ICD-10-CM | POA: Diagnosis not present

## 2016-10-17 DIAGNOSIS — M1990 Unspecified osteoarthritis, unspecified site: Secondary | ICD-10-CM | POA: Diagnosis not present

## 2016-10-17 DIAGNOSIS — R488 Other symbolic dysfunctions: Secondary | ICD-10-CM | POA: Diagnosis not present

## 2016-10-17 DIAGNOSIS — G8929 Other chronic pain: Secondary | ICD-10-CM | POA: Diagnosis not present

## 2016-10-17 DIAGNOSIS — M419 Scoliosis, unspecified: Secondary | ICD-10-CM | POA: Diagnosis not present

## 2016-10-17 DIAGNOSIS — I5032 Chronic diastolic (congestive) heart failure: Secondary | ICD-10-CM | POA: Diagnosis not present

## 2016-10-17 DIAGNOSIS — M6281 Muscle weakness (generalized): Secondary | ICD-10-CM | POA: Diagnosis not present

## 2016-10-21 DIAGNOSIS — M419 Scoliosis, unspecified: Secondary | ICD-10-CM | POA: Diagnosis not present

## 2016-10-21 DIAGNOSIS — D649 Anemia, unspecified: Secondary | ICD-10-CM | POA: Diagnosis not present

## 2016-10-21 DIAGNOSIS — M1990 Unspecified osteoarthritis, unspecified site: Secondary | ICD-10-CM | POA: Diagnosis not present

## 2016-10-21 DIAGNOSIS — R488 Other symbolic dysfunctions: Secondary | ICD-10-CM | POA: Diagnosis not present

## 2016-10-21 DIAGNOSIS — G8929 Other chronic pain: Secondary | ICD-10-CM | POA: Diagnosis not present

## 2016-10-21 DIAGNOSIS — I5032 Chronic diastolic (congestive) heart failure: Secondary | ICD-10-CM | POA: Diagnosis not present

## 2016-10-21 DIAGNOSIS — M6281 Muscle weakness (generalized): Secondary | ICD-10-CM | POA: Diagnosis not present

## 2016-10-21 DIAGNOSIS — M792 Neuralgia and neuritis, unspecified: Secondary | ICD-10-CM | POA: Diagnosis not present

## 2016-10-23 DIAGNOSIS — G8929 Other chronic pain: Secondary | ICD-10-CM | POA: Diagnosis not present

## 2016-10-23 DIAGNOSIS — M1990 Unspecified osteoarthritis, unspecified site: Secondary | ICD-10-CM | POA: Diagnosis not present

## 2016-10-23 DIAGNOSIS — M419 Scoliosis, unspecified: Secondary | ICD-10-CM | POA: Diagnosis not present

## 2016-10-23 DIAGNOSIS — R488 Other symbolic dysfunctions: Secondary | ICD-10-CM | POA: Diagnosis not present

## 2016-10-23 DIAGNOSIS — D649 Anemia, unspecified: Secondary | ICD-10-CM | POA: Diagnosis not present

## 2016-10-23 DIAGNOSIS — R143 Flatulence: Secondary | ICD-10-CM | POA: Diagnosis not present

## 2016-10-23 DIAGNOSIS — I5032 Chronic diastolic (congestive) heart failure: Secondary | ICD-10-CM | POA: Diagnosis not present

## 2016-10-23 DIAGNOSIS — M792 Neuralgia and neuritis, unspecified: Secondary | ICD-10-CM | POA: Diagnosis not present

## 2016-10-23 DIAGNOSIS — I10 Essential (primary) hypertension: Secondary | ICD-10-CM | POA: Diagnosis not present

## 2016-10-23 DIAGNOSIS — M6281 Muscle weakness (generalized): Secondary | ICD-10-CM | POA: Diagnosis not present

## 2016-10-24 DIAGNOSIS — M1990 Unspecified osteoarthritis, unspecified site: Secondary | ICD-10-CM | POA: Diagnosis not present

## 2016-10-24 DIAGNOSIS — M792 Neuralgia and neuritis, unspecified: Secondary | ICD-10-CM | POA: Diagnosis not present

## 2016-10-24 DIAGNOSIS — D649 Anemia, unspecified: Secondary | ICD-10-CM | POA: Diagnosis not present

## 2016-10-24 DIAGNOSIS — I5032 Chronic diastolic (congestive) heart failure: Secondary | ICD-10-CM | POA: Diagnosis not present

## 2016-10-24 DIAGNOSIS — M419 Scoliosis, unspecified: Secondary | ICD-10-CM | POA: Diagnosis not present

## 2016-10-24 DIAGNOSIS — M6281 Muscle weakness (generalized): Secondary | ICD-10-CM | POA: Diagnosis not present

## 2016-10-24 DIAGNOSIS — G8929 Other chronic pain: Secondary | ICD-10-CM | POA: Diagnosis not present

## 2016-10-24 DIAGNOSIS — R488 Other symbolic dysfunctions: Secondary | ICD-10-CM | POA: Diagnosis not present

## 2016-10-28 DIAGNOSIS — I5032 Chronic diastolic (congestive) heart failure: Secondary | ICD-10-CM | POA: Diagnosis not present

## 2016-10-28 DIAGNOSIS — M6281 Muscle weakness (generalized): Secondary | ICD-10-CM | POA: Diagnosis not present

## 2016-10-28 DIAGNOSIS — M1990 Unspecified osteoarthritis, unspecified site: Secondary | ICD-10-CM | POA: Diagnosis not present

## 2016-10-28 DIAGNOSIS — R488 Other symbolic dysfunctions: Secondary | ICD-10-CM | POA: Diagnosis not present

## 2016-10-28 DIAGNOSIS — G8929 Other chronic pain: Secondary | ICD-10-CM | POA: Diagnosis not present

## 2016-10-28 DIAGNOSIS — D649 Anemia, unspecified: Secondary | ICD-10-CM | POA: Diagnosis not present

## 2016-10-28 DIAGNOSIS — M419 Scoliosis, unspecified: Secondary | ICD-10-CM | POA: Diagnosis not present

## 2016-10-28 DIAGNOSIS — M792 Neuralgia and neuritis, unspecified: Secondary | ICD-10-CM | POA: Diagnosis not present

## 2016-10-29 DIAGNOSIS — R488 Other symbolic dysfunctions: Secondary | ICD-10-CM | POA: Diagnosis not present

## 2016-10-29 DIAGNOSIS — I5032 Chronic diastolic (congestive) heart failure: Secondary | ICD-10-CM | POA: Diagnosis not present

## 2016-10-29 DIAGNOSIS — D649 Anemia, unspecified: Secondary | ICD-10-CM | POA: Diagnosis not present

## 2016-10-29 DIAGNOSIS — M792 Neuralgia and neuritis, unspecified: Secondary | ICD-10-CM | POA: Diagnosis not present

## 2016-10-29 DIAGNOSIS — G8929 Other chronic pain: Secondary | ICD-10-CM | POA: Diagnosis not present

## 2016-10-29 DIAGNOSIS — M1990 Unspecified osteoarthritis, unspecified site: Secondary | ICD-10-CM | POA: Diagnosis not present

## 2016-10-29 DIAGNOSIS — M419 Scoliosis, unspecified: Secondary | ICD-10-CM | POA: Diagnosis not present

## 2016-10-29 DIAGNOSIS — M6281 Muscle weakness (generalized): Secondary | ICD-10-CM | POA: Diagnosis not present

## 2016-10-30 DIAGNOSIS — M792 Neuralgia and neuritis, unspecified: Secondary | ICD-10-CM | POA: Diagnosis not present

## 2016-10-30 DIAGNOSIS — M1990 Unspecified osteoarthritis, unspecified site: Secondary | ICD-10-CM | POA: Diagnosis not present

## 2016-10-30 DIAGNOSIS — M6281 Muscle weakness (generalized): Secondary | ICD-10-CM | POA: Diagnosis not present

## 2016-10-30 DIAGNOSIS — D649 Anemia, unspecified: Secondary | ICD-10-CM | POA: Diagnosis not present

## 2016-10-30 DIAGNOSIS — G8929 Other chronic pain: Secondary | ICD-10-CM | POA: Diagnosis not present

## 2016-10-30 DIAGNOSIS — M419 Scoliosis, unspecified: Secondary | ICD-10-CM | POA: Diagnosis not present

## 2016-10-30 DIAGNOSIS — I5032 Chronic diastolic (congestive) heart failure: Secondary | ICD-10-CM | POA: Diagnosis not present

## 2016-10-30 DIAGNOSIS — R488 Other symbolic dysfunctions: Secondary | ICD-10-CM | POA: Diagnosis not present

## 2016-10-31 DIAGNOSIS — R488 Other symbolic dysfunctions: Secondary | ICD-10-CM | POA: Diagnosis not present

## 2016-10-31 DIAGNOSIS — D649 Anemia, unspecified: Secondary | ICD-10-CM | POA: Diagnosis not present

## 2016-10-31 DIAGNOSIS — M419 Scoliosis, unspecified: Secondary | ICD-10-CM | POA: Diagnosis not present

## 2016-10-31 DIAGNOSIS — M6281 Muscle weakness (generalized): Secondary | ICD-10-CM | POA: Diagnosis not present

## 2016-10-31 DIAGNOSIS — M792 Neuralgia and neuritis, unspecified: Secondary | ICD-10-CM | POA: Diagnosis not present

## 2016-10-31 DIAGNOSIS — G8929 Other chronic pain: Secondary | ICD-10-CM | POA: Diagnosis not present

## 2016-10-31 DIAGNOSIS — I5032 Chronic diastolic (congestive) heart failure: Secondary | ICD-10-CM | POA: Diagnosis not present

## 2016-10-31 DIAGNOSIS — M1990 Unspecified osteoarthritis, unspecified site: Secondary | ICD-10-CM | POA: Diagnosis not present

## 2016-11-04 DIAGNOSIS — G8929 Other chronic pain: Secondary | ICD-10-CM | POA: Diagnosis not present

## 2016-11-04 DIAGNOSIS — I5032 Chronic diastolic (congestive) heart failure: Secondary | ICD-10-CM | POA: Diagnosis not present

## 2016-11-04 DIAGNOSIS — M1990 Unspecified osteoarthritis, unspecified site: Secondary | ICD-10-CM | POA: Diagnosis not present

## 2016-11-04 DIAGNOSIS — M6281 Muscle weakness (generalized): Secondary | ICD-10-CM | POA: Diagnosis not present

## 2016-11-04 DIAGNOSIS — R488 Other symbolic dysfunctions: Secondary | ICD-10-CM | POA: Diagnosis not present

## 2016-11-04 DIAGNOSIS — D649 Anemia, unspecified: Secondary | ICD-10-CM | POA: Diagnosis not present

## 2016-11-04 DIAGNOSIS — M419 Scoliosis, unspecified: Secondary | ICD-10-CM | POA: Diagnosis not present

## 2016-11-04 DIAGNOSIS — M792 Neuralgia and neuritis, unspecified: Secondary | ICD-10-CM | POA: Diagnosis not present

## 2016-11-06 DIAGNOSIS — G8929 Other chronic pain: Secondary | ICD-10-CM | POA: Diagnosis not present

## 2016-11-06 DIAGNOSIS — M6281 Muscle weakness (generalized): Secondary | ICD-10-CM | POA: Diagnosis not present

## 2016-11-06 DIAGNOSIS — D649 Anemia, unspecified: Secondary | ICD-10-CM | POA: Diagnosis not present

## 2016-11-06 DIAGNOSIS — R488 Other symbolic dysfunctions: Secondary | ICD-10-CM | POA: Diagnosis not present

## 2016-11-06 DIAGNOSIS — M419 Scoliosis, unspecified: Secondary | ICD-10-CM | POA: Diagnosis not present

## 2016-11-06 DIAGNOSIS — M1990 Unspecified osteoarthritis, unspecified site: Secondary | ICD-10-CM | POA: Diagnosis not present

## 2016-11-06 DIAGNOSIS — M792 Neuralgia and neuritis, unspecified: Secondary | ICD-10-CM | POA: Diagnosis not present

## 2016-11-06 DIAGNOSIS — I5032 Chronic diastolic (congestive) heart failure: Secondary | ICD-10-CM | POA: Diagnosis not present

## 2016-11-06 DIAGNOSIS — N3281 Overactive bladder: Secondary | ICD-10-CM | POA: Diagnosis not present

## 2016-11-07 DIAGNOSIS — D649 Anemia, unspecified: Secondary | ICD-10-CM | POA: Diagnosis not present

## 2016-11-07 DIAGNOSIS — R488 Other symbolic dysfunctions: Secondary | ICD-10-CM | POA: Diagnosis not present

## 2016-11-07 DIAGNOSIS — M792 Neuralgia and neuritis, unspecified: Secondary | ICD-10-CM | POA: Diagnosis not present

## 2016-11-07 DIAGNOSIS — M6281 Muscle weakness (generalized): Secondary | ICD-10-CM | POA: Diagnosis not present

## 2016-11-07 DIAGNOSIS — I5032 Chronic diastolic (congestive) heart failure: Secondary | ICD-10-CM | POA: Diagnosis not present

## 2016-11-07 DIAGNOSIS — G8929 Other chronic pain: Secondary | ICD-10-CM | POA: Diagnosis not present

## 2016-11-07 DIAGNOSIS — M1990 Unspecified osteoarthritis, unspecified site: Secondary | ICD-10-CM | POA: Diagnosis not present

## 2016-11-07 DIAGNOSIS — M419 Scoliosis, unspecified: Secondary | ICD-10-CM | POA: Diagnosis not present

## 2016-11-08 DIAGNOSIS — M1990 Unspecified osteoarthritis, unspecified site: Secondary | ICD-10-CM | POA: Diagnosis not present

## 2016-11-08 DIAGNOSIS — D649 Anemia, unspecified: Secondary | ICD-10-CM | POA: Diagnosis not present

## 2016-11-08 DIAGNOSIS — I5032 Chronic diastolic (congestive) heart failure: Secondary | ICD-10-CM | POA: Diagnosis not present

## 2016-11-08 DIAGNOSIS — M792 Neuralgia and neuritis, unspecified: Secondary | ICD-10-CM | POA: Diagnosis not present

## 2016-11-08 DIAGNOSIS — M6281 Muscle weakness (generalized): Secondary | ICD-10-CM | POA: Diagnosis not present

## 2016-11-08 DIAGNOSIS — R488 Other symbolic dysfunctions: Secondary | ICD-10-CM | POA: Diagnosis not present

## 2016-11-08 DIAGNOSIS — M419 Scoliosis, unspecified: Secondary | ICD-10-CM | POA: Diagnosis not present

## 2016-11-08 DIAGNOSIS — G8929 Other chronic pain: Secondary | ICD-10-CM | POA: Diagnosis not present

## 2016-11-15 DIAGNOSIS — M6281 Muscle weakness (generalized): Secondary | ICD-10-CM | POA: Diagnosis not present

## 2016-11-15 DIAGNOSIS — M1990 Unspecified osteoarthritis, unspecified site: Secondary | ICD-10-CM | POA: Diagnosis not present

## 2016-11-15 DIAGNOSIS — M419 Scoliosis, unspecified: Secondary | ICD-10-CM | POA: Diagnosis not present

## 2016-11-15 DIAGNOSIS — G8929 Other chronic pain: Secondary | ICD-10-CM | POA: Diagnosis not present

## 2016-11-15 DIAGNOSIS — I5032 Chronic diastolic (congestive) heart failure: Secondary | ICD-10-CM | POA: Diagnosis not present

## 2016-11-15 DIAGNOSIS — D649 Anemia, unspecified: Secondary | ICD-10-CM | POA: Diagnosis not present

## 2016-11-15 DIAGNOSIS — R488 Other symbolic dysfunctions: Secondary | ICD-10-CM | POA: Diagnosis not present

## 2016-11-15 DIAGNOSIS — M792 Neuralgia and neuritis, unspecified: Secondary | ICD-10-CM | POA: Diagnosis not present

## 2016-11-18 DIAGNOSIS — M1990 Unspecified osteoarthritis, unspecified site: Secondary | ICD-10-CM | POA: Diagnosis not present

## 2016-11-18 DIAGNOSIS — G8929 Other chronic pain: Secondary | ICD-10-CM | POA: Diagnosis not present

## 2016-11-18 DIAGNOSIS — M6281 Muscle weakness (generalized): Secondary | ICD-10-CM | POA: Diagnosis not present

## 2016-11-18 DIAGNOSIS — R488 Other symbolic dysfunctions: Secondary | ICD-10-CM | POA: Diagnosis not present

## 2016-11-18 DIAGNOSIS — M792 Neuralgia and neuritis, unspecified: Secondary | ICD-10-CM | POA: Diagnosis not present

## 2016-11-18 DIAGNOSIS — I5032 Chronic diastolic (congestive) heart failure: Secondary | ICD-10-CM | POA: Diagnosis not present

## 2016-11-18 DIAGNOSIS — M419 Scoliosis, unspecified: Secondary | ICD-10-CM | POA: Diagnosis not present

## 2016-11-18 DIAGNOSIS — D649 Anemia, unspecified: Secondary | ICD-10-CM | POA: Diagnosis not present

## 2016-11-20 DIAGNOSIS — R488 Other symbolic dysfunctions: Secondary | ICD-10-CM | POA: Diagnosis not present

## 2016-11-20 DIAGNOSIS — M792 Neuralgia and neuritis, unspecified: Secondary | ICD-10-CM | POA: Diagnosis not present

## 2016-11-20 DIAGNOSIS — D649 Anemia, unspecified: Secondary | ICD-10-CM | POA: Diagnosis not present

## 2016-11-20 DIAGNOSIS — M6281 Muscle weakness (generalized): Secondary | ICD-10-CM | POA: Diagnosis not present

## 2016-11-20 DIAGNOSIS — M419 Scoliosis, unspecified: Secondary | ICD-10-CM | POA: Diagnosis not present

## 2016-11-20 DIAGNOSIS — M1990 Unspecified osteoarthritis, unspecified site: Secondary | ICD-10-CM | POA: Diagnosis not present

## 2016-11-20 DIAGNOSIS — I5032 Chronic diastolic (congestive) heart failure: Secondary | ICD-10-CM | POA: Diagnosis not present

## 2016-11-20 DIAGNOSIS — G8929 Other chronic pain: Secondary | ICD-10-CM | POA: Diagnosis not present

## 2016-11-22 ENCOUNTER — Telehealth: Payer: Self-pay | Admitting: Gastroenterology

## 2016-11-22 NOTE — Telephone Encounter (Signed)
Patient is not taking her miralax as directed, asked daughter-in-law to make sure she does take this. Still having numerous incontinent stools per day. Patient is in an assisted living facility. Asked her to call back and schedule a follow up visit this does not help, and if they do to please be sure one of the family members come with her.

## 2016-11-25 DIAGNOSIS — D649 Anemia, unspecified: Secondary | ICD-10-CM | POA: Diagnosis not present

## 2016-11-25 DIAGNOSIS — M545 Low back pain: Secondary | ICD-10-CM | POA: Diagnosis not present

## 2016-11-25 DIAGNOSIS — K5900 Constipation, unspecified: Secondary | ICD-10-CM | POA: Diagnosis not present

## 2016-11-25 DIAGNOSIS — J309 Allergic rhinitis, unspecified: Secondary | ICD-10-CM | POA: Diagnosis not present

## 2016-11-25 DIAGNOSIS — Z79899 Other long term (current) drug therapy: Secondary | ICD-10-CM | POA: Diagnosis not present

## 2016-12-02 DIAGNOSIS — Z79899 Other long term (current) drug therapy: Secondary | ICD-10-CM | POA: Diagnosis not present

## 2016-12-14 NOTE — Addendum Note (Signed)
Addendum  created 12/14/16 0933 by Duane Boston, MD   Sign clinical note

## 2016-12-16 DIAGNOSIS — E871 Hypo-osmolality and hyponatremia: Secondary | ICD-10-CM | POA: Diagnosis not present

## 2016-12-16 DIAGNOSIS — K5900 Constipation, unspecified: Secondary | ICD-10-CM | POA: Diagnosis not present

## 2016-12-16 DIAGNOSIS — K219 Gastro-esophageal reflux disease without esophagitis: Secondary | ICD-10-CM | POA: Diagnosis not present

## 2016-12-16 NOTE — Addendum Note (Signed)
Addendum  created 12/16/16 1102 by Myrtie Soman, MD   Sign clinical note

## 2016-12-19 DIAGNOSIS — I8393 Asymptomatic varicose veins of bilateral lower extremities: Secondary | ICD-10-CM | POA: Diagnosis not present

## 2016-12-19 DIAGNOSIS — L600 Ingrowing nail: Secondary | ICD-10-CM | POA: Diagnosis not present

## 2016-12-19 DIAGNOSIS — Z89412 Acquired absence of left great toe: Secondary | ICD-10-CM | POA: Diagnosis not present

## 2016-12-19 DIAGNOSIS — B351 Tinea unguium: Secondary | ICD-10-CM | POA: Diagnosis not present

## 2016-12-19 DIAGNOSIS — I739 Peripheral vascular disease, unspecified: Secondary | ICD-10-CM | POA: Diagnosis not present

## 2016-12-23 DIAGNOSIS — K5900 Constipation, unspecified: Secondary | ICD-10-CM | POA: Diagnosis not present

## 2016-12-23 DIAGNOSIS — I509 Heart failure, unspecified: Secondary | ICD-10-CM | POA: Diagnosis not present

## 2016-12-23 DIAGNOSIS — K219 Gastro-esophageal reflux disease without esophagitis: Secondary | ICD-10-CM | POA: Diagnosis not present

## 2016-12-30 DIAGNOSIS — M419 Scoliosis, unspecified: Secondary | ICD-10-CM | POA: Diagnosis not present

## 2016-12-30 DIAGNOSIS — E43 Unspecified severe protein-calorie malnutrition: Secondary | ICD-10-CM | POA: Diagnosis not present

## 2016-12-30 DIAGNOSIS — I739 Peripheral vascular disease, unspecified: Secondary | ICD-10-CM | POA: Diagnosis not present

## 2017-01-11 DIAGNOSIS — K219 Gastro-esophageal reflux disease without esophagitis: Secondary | ICD-10-CM | POA: Diagnosis not present

## 2017-01-11 DIAGNOSIS — I8393 Asymptomatic varicose veins of bilateral lower extremities: Secondary | ICD-10-CM | POA: Diagnosis not present

## 2017-01-11 DIAGNOSIS — Z79899 Other long term (current) drug therapy: Secondary | ICD-10-CM | POA: Diagnosis not present

## 2017-01-11 DIAGNOSIS — E871 Hypo-osmolality and hyponatremia: Secondary | ICD-10-CM | POA: Diagnosis not present

## 2017-01-13 DIAGNOSIS — R030 Elevated blood-pressure reading, without diagnosis of hypertension: Secondary | ICD-10-CM | POA: Diagnosis not present

## 2017-01-13 DIAGNOSIS — Z79899 Other long term (current) drug therapy: Secondary | ICD-10-CM | POA: Diagnosis not present

## 2017-01-13 DIAGNOSIS — K5900 Constipation, unspecified: Secondary | ICD-10-CM | POA: Diagnosis not present

## 2017-01-13 DIAGNOSIS — M545 Low back pain: Secondary | ICD-10-CM | POA: Diagnosis not present

## 2017-01-13 DIAGNOSIS — L989 Disorder of the skin and subcutaneous tissue, unspecified: Secondary | ICD-10-CM | POA: Diagnosis not present

## 2017-01-27 DIAGNOSIS — M545 Low back pain: Secondary | ICD-10-CM | POA: Diagnosis not present

## 2017-01-27 DIAGNOSIS — Z79899 Other long term (current) drug therapy: Secondary | ICD-10-CM | POA: Diagnosis not present

## 2017-01-27 DIAGNOSIS — M810 Age-related osteoporosis without current pathological fracture: Secondary | ICD-10-CM | POA: Diagnosis not present

## 2017-01-27 DIAGNOSIS — K5909 Other constipation: Secondary | ICD-10-CM | POA: Diagnosis not present

## 2017-02-20 DIAGNOSIS — L600 Ingrowing nail: Secondary | ICD-10-CM | POA: Diagnosis not present

## 2017-02-20 DIAGNOSIS — B351 Tinea unguium: Secondary | ICD-10-CM | POA: Diagnosis not present

## 2017-02-24 DIAGNOSIS — M545 Low back pain: Secondary | ICD-10-CM | POA: Diagnosis not present

## 2017-02-24 DIAGNOSIS — K5909 Other constipation: Secondary | ICD-10-CM | POA: Diagnosis not present

## 2017-02-24 DIAGNOSIS — G629 Polyneuropathy, unspecified: Secondary | ICD-10-CM | POA: Diagnosis not present

## 2017-02-24 DIAGNOSIS — M810 Age-related osteoporosis without current pathological fracture: Secondary | ICD-10-CM | POA: Diagnosis not present

## 2017-03-21 DIAGNOSIS — Z79899 Other long term (current) drug therapy: Secondary | ICD-10-CM | POA: Diagnosis not present

## 2017-03-24 DIAGNOSIS — J3089 Other allergic rhinitis: Secondary | ICD-10-CM | POA: Diagnosis not present

## 2017-03-24 DIAGNOSIS — K219 Gastro-esophageal reflux disease without esophagitis: Secondary | ICD-10-CM | POA: Diagnosis not present

## 2017-03-24 DIAGNOSIS — I5089 Other heart failure: Secondary | ICD-10-CM | POA: Diagnosis not present

## 2017-03-26 DIAGNOSIS — R600 Localized edema: Secondary | ICD-10-CM | POA: Diagnosis not present

## 2017-04-14 DIAGNOSIS — R600 Localized edema: Secondary | ICD-10-CM | POA: Diagnosis not present

## 2017-04-14 DIAGNOSIS — K644 Residual hemorrhoidal skin tags: Secondary | ICD-10-CM | POA: Diagnosis not present

## 2017-04-14 DIAGNOSIS — D6489 Other specified anemias: Secondary | ICD-10-CM | POA: Diagnosis not present

## 2017-04-24 DIAGNOSIS — D649 Anemia, unspecified: Secondary | ICD-10-CM | POA: Diagnosis not present

## 2017-04-29 DIAGNOSIS — L600 Ingrowing nail: Secondary | ICD-10-CM | POA: Diagnosis not present

## 2017-04-29 DIAGNOSIS — B351 Tinea unguium: Secondary | ICD-10-CM | POA: Diagnosis not present

## 2017-05-05 DIAGNOSIS — I739 Peripheral vascular disease, unspecified: Secondary | ICD-10-CM | POA: Diagnosis not present

## 2017-05-05 DIAGNOSIS — D509 Iron deficiency anemia, unspecified: Secondary | ICD-10-CM | POA: Diagnosis not present

## 2017-05-05 DIAGNOSIS — I509 Heart failure, unspecified: Secondary | ICD-10-CM | POA: Diagnosis not present

## 2017-05-05 DIAGNOSIS — G9009 Other idiopathic peripheral autonomic neuropathy: Secondary | ICD-10-CM | POA: Diagnosis not present

## 2017-06-02 DIAGNOSIS — M5489 Other dorsalgia: Secondary | ICD-10-CM | POA: Diagnosis not present

## 2017-06-02 DIAGNOSIS — M810 Age-related osteoporosis without current pathological fracture: Secondary | ICD-10-CM | POA: Diagnosis not present

## 2017-06-02 DIAGNOSIS — J3089 Other allergic rhinitis: Secondary | ICD-10-CM | POA: Diagnosis not present

## 2017-06-02 DIAGNOSIS — Z79899 Other long term (current) drug therapy: Secondary | ICD-10-CM | POA: Diagnosis not present

## 2017-06-02 DIAGNOSIS — K5909 Other constipation: Secondary | ICD-10-CM | POA: Diagnosis not present

## 2017-06-04 ENCOUNTER — Ambulatory Visit (INDEPENDENT_AMBULATORY_CARE_PROVIDER_SITE_OTHER): Payer: Medicare Other | Admitting: Gastroenterology

## 2017-06-04 ENCOUNTER — Encounter: Payer: Self-pay | Admitting: Gastroenterology

## 2017-06-04 VITALS — BP 138/80 | HR 66 | Ht 59.0 in | Wt 109.8 lb

## 2017-06-04 DIAGNOSIS — K648 Other hemorrhoids: Secondary | ICD-10-CM

## 2017-06-04 DIAGNOSIS — R159 Full incontinence of feces: Secondary | ICD-10-CM | POA: Diagnosis not present

## 2017-06-04 DIAGNOSIS — D649 Anemia, unspecified: Secondary | ICD-10-CM | POA: Diagnosis not present

## 2017-06-04 NOTE — Patient Instructions (Signed)
We will contact [place] to schedule a colonoscopy for you.    If you are age 73 or older, your body mass index should be between 23-30. Your Body mass index is 22.18 kg/m. If this is out of the aforementioned range listed, please consider follow up with your Primary Care Provider.  If you are age 22 or younger, your body mass index should be between 19-25. Your Body mass index is 22.18 kg/m. If this is out of the aformentioned range listed, please consider follow up with your Primary Care Provider.   Thank you for choosing [facility]  Dr Wilfrid Lund III

## 2017-06-04 NOTE — Progress Notes (Signed)
[facility] Progress Note  Chief Complaint: Rectal bleeding and fecal incontinence with anemia  Subjective  History:  This is a 71 year old woman I have seen twice in the past, most recently March of this year.  She is a tangential and poor historian as before.  It appears she was sent to [place] by her physician at [facility], who comes to her facility.  She has been found to be anemic with a normal MCV and hemoglobin of 9.5.  She was therefore started on iron, though it is not clear if she is iron deficient.  She says the iron is just making her stool sticky and difficult to manage.  She still has incontinence of stool with no sensation of it.  She denies abdominal pain, loss of appetite, but still think she may have lost some weight as she has mentioned on previous visits.  She is distracted and tangential as before, going on about issues with multiple roommates she has had and concerns that her medicine list is inaccurate. Satin also reports some intermittent painless rectal bleeding and thinks it is from hemorrhoids. ROS: Cardiovascular:  no chest pain Respiratory: no dyspnea  The patient's Past Medical, Family and Social History were reviewed and are on file in the EMR.  Objective:  Med list reviewed  Current Outpatient Medications:  .  acetaminophen (TYLENOL) 325 MG tablet, Take 650 mg by mouth every 6 (six) hours as needed for mild pain., Disp: , Rfl:  .  AMBULATORY NON FORMULARY MEDICATION, Give 120 ml MedPass by mouth twice daily., Disp: , Rfl:  .  bisacodyl (DULCOLAX) 10 MG suppository, Place 10 mg rectally as needed for moderate constipation., Disp: , Rfl:  .  calcium carbonate (TUMS - DOSED IN MG ELEMENTAL CALCIUM) 500 MG chewable tablet, Chew 1 tablet by mouth as needed for indigestion or heartburn., Disp: , Rfl:  .  Docusate Calcium (STOOL SOFTENER PO), Take 1 tablet by mouth daily., Disp: , Rfl:  .  fexofenadine (ALLEGRA) 180 MG tablet, Take 180 mg by  mouth daily., Disp: , Rfl:  .  fluticasone (FLONASE) 50 MCG/ACT nasal spray, Place 2 sprays into both nostrils daily as needed for allergies or rhinitis., Disp: , Rfl:  .  gabapentin (NEURONTIN) 300 MG capsule, Take 300 mg by mouth 3 (three) times daily. , Disp: , Rfl:  .  ipratropium (ATROVENT) 0.06 % nasal spray, Place 2 sprays into both nostrils as directed., Disp: , Rfl:  .  losartan-hydrochlorothiazide (HYZAAR) 50-12.5 MG tablet, Take 1 tablet by mouth daily., Disp: , Rfl:  .  meloxicam (MOBIC) 7.5 MG tablet, Take 7.5 mg by mouth daily., Disp: , Rfl:  .  mirabegron ER (MYRBETRIQ) 25 MG TB24 tablet, Take 25 mg by mouth daily., Disp: , Rfl:  .  Multiple Vitamin (MULTIVITAMIN WITH MINERALS) TABS tablet, Take 1 tablet by mouth daily., Disp: , Rfl:  .  pantoprazole (PROTONIX) 40 MG tablet, Take 1 tablet (40 mg total) by mouth 2 (two) times daily., Disp: , Rfl:  .  polyethylene glycol (MIRALAX / GLYCOLAX) packet, Take 17 g by mouth daily. Hold for loose stool, Disp: , Rfl:  .  pravastatin (PRAVACHOL) 20 MG tablet, Take 20 mg by mouth daily. , Disp: , Rfl:  .  simethicone (MYLICON) 119 MG chewable tablet, Chew 125 mg by mouth 2 (two) times daily. , Disp: , Rfl:  .  solifenacin (VESICARE) 10 MG tablet, Take 10 mg by mouth daily., Disp: , Rfl:  .  triamcinolone cream (KENALOG) 0.1 %, Apply 1 application topically as directed., Disp: , Rfl:    Vital signs in last 24 hrs: Vitals:   06/04/17 1132  BP: 138/80  Pulse: 66  SpO2: 96%    Physical Exam  Febrile elderly woman, great difficulty getting on exam table and requires assistance.  HEENT: sclera anicteric, oral mucosa moist without lesions.  Very poor dentition  Neck: supple, no thyromegaly, JVD or lymphadenopathy  Cardiac: RRR without murmurs, S1S2 heard, no peripheral edema  Pulm: clear to auscultation bilaterally, normal RR and effort noted  Abdomen: soft, no tenderness, with active bowel sounds. No guarding or palpable  hepatosplenomegaly.  Skin; warm and dry, no jaundice or rash Rectal exam: Poor resting  and voluntary sphincter tone grade 2 right posterior internal hemorrhoid, no anal wink reflex  Recent Labs:  Labs as noted above, reviewed from the referral notes.   @ASSESSMENTPLANBEGIN @ Assessment: Encounter Diagnoses  Name Primary?  . Full incontinence of feces Yes  . Internal hemorrhoid, bleeding   . Anemia, unspecified type     Her bleeding appears to be from internal hemorrhoids.  I do not think the hemorrhoids are causing the incontinence, she clearly has very reduced sphincter tone.  She also has poor sensation in that area.  There is concern on the part of her referring physician about this anemia and whether it may be related to GI bleeding.  Plan:  I have offered this patient a colonoscopy to be done at the hospital outpatient endoscopy lab given her multiple medical issues.  I had a thorough discussion of the procedure with her including risks and benefits and she is agreeable.  As before, it is difficult to have a detailed conversation with her given the issues noted above. I have my concerns about how well she will be able to manage the prep, but we will schedule this through her facility so they can take the instructions and also give her assistance as needed.  This patient has not been accompanied by family members to any of her visits.  Total time 30 minutes, over half spent in counseling and coordination of care.   Nelida Meuse III

## 2017-06-09 ENCOUNTER — Other Ambulatory Visit: Payer: Self-pay

## 2017-06-09 ENCOUNTER — Telehealth: Payer: Self-pay

## 2017-06-09 DIAGNOSIS — K625 Hemorrhage of anus and rectum: Secondary | ICD-10-CM

## 2017-06-09 NOTE — Telephone Encounter (Signed)
Pt has been scheduled for a colonoscopy on 07-18-2017 @ [facility] endo at 1030am. Pt is to arrive 1 hour and 30 min earlier, with arrival at 9am. I have tried to reach the nursing home [facility] at [phone]. Left a message to return call. Need to give instructions and bowel prep Rx to the patients nurse.

## 2017-06-26 ENCOUNTER — Other Ambulatory Visit: Payer: Self-pay

## 2017-06-26 MED ORDER — NA SULFATE-K SULFATE-MG SULF 17.5-3.13-1.6 GM/177ML PO SOLN: 1.0000 | Freq: Once | ORAL | 0 refills | Status: AC

## 2017-06-26 NOTE — Telephone Encounter (Signed)
Spoke to [place] at [employer]. Instructions and prep have been faxed.

## 2017-06-30 DIAGNOSIS — R2689 Other abnormalities of gait and mobility: Secondary | ICD-10-CM | POA: Diagnosis not present

## 2017-06-30 DIAGNOSIS — I5089 Other heart failure: Secondary | ICD-10-CM | POA: Diagnosis not present

## 2017-06-30 DIAGNOSIS — M545 Low back pain: Secondary | ICD-10-CM | POA: Diagnosis not present

## 2017-06-30 DIAGNOSIS — K219 Gastro-esophageal reflux disease without esophagitis: Secondary | ICD-10-CM | POA: Diagnosis not present

## 2017-06-30 NOTE — Progress Notes (Addendum)
Preop instructions for: Ashley Howard, Ashley Howard                         Date of Birth       July 09, 1946                     Date of Procedure:   07/18/2017    Doctor: Loletha Carrow Time to arrive at [facility]: Report to: Admitting  Procedure: Any procedure time changes, MD office will notify you!   Do not eat or drink past midnight the night before your procedure.(To include any tube feedings-must be discontinued) Reminder:Follow bowel prep instructions per MD office!   Take these morning medications only with sips of water.(or give through gastrostomy or feeding tube).  Note: No Insulin or Diabetic meds should be given or taken the morning of the procedure!   Facility contact: [employer]                 Phone:                  Health Care POA: [name]  Transportation contact phone#: facility transportation  Please send day of procedure:current med list and meds last taken that day, confirm nothing by mouth status from what time, Patient Demographic info( to include DNR status, problem list, allergies)   RN contact name/phone#:     Santiago Glad [phone]                        and Fax #: [phone]  Bring Insurance card and picture ID Leave all jewelry and other valuables at place where living( no metal or rings to be worn) No contact lens Women-no make-up, no lotions,perfumes,powders Men-no colognes,lotions  Any questions day of procedure,call Endoscopy unit-[phone]!   Sent from :[facility]                   Phone:[phone]                   Fax:[phone]  Sent by :Pervis Hocking Maryam Feely

## 2017-07-03 DIAGNOSIS — K219 Gastro-esophageal reflux disease without esophagitis: Secondary | ICD-10-CM | POA: Diagnosis not present

## 2017-07-03 DIAGNOSIS — M545 Low back pain: Secondary | ICD-10-CM | POA: Diagnosis not present

## 2017-07-03 DIAGNOSIS — D509 Iron deficiency anemia, unspecified: Secondary | ICD-10-CM | POA: Diagnosis not present

## 2017-07-03 DIAGNOSIS — D473 Essential (hemorrhagic) thrombocythemia: Secondary | ICD-10-CM | POA: Diagnosis not present

## 2017-07-03 DIAGNOSIS — B351 Tinea unguium: Secondary | ICD-10-CM | POA: Diagnosis not present

## 2017-07-10 NOTE — Progress Notes (Signed)
Instructions for procedure faxed to [employer] at [facility]. Fax # [phone] also requested MAR, Labs within 30 days , EKG , CXR last 2 years.Per [facility] [facility] pt. Has only been a resident for a couple of months and she has no CXR or EKG on file. Facility will provide transportation and pt. Takes meds whole with water.and can sign own consent.

## 2017-07-10 NOTE — Progress Notes (Addendum)
Preop instructions for: Ashley Howard, Ashley Howard                         Date of Birth       June 03, 1946                     Date of Procedure:   07/18/2017    Doctor: Loletha Carrow Time to arrive at [facility]: Report to: Admitting  Procedure: Any procedure time changes, MD office will notify you!   Do not eat or drink past midnight the night before your procedure.(To include any tube feedings-must be discontinued) Reminder:Follow bowel prep instructions per MD office!   Take these morning medications only with sips of water.(or give through gastrostomy or feeding tube).,myrbetriq,  omeprazole  Note: No Insulin or Diabetic meds should be given or taken the morning of the procedure!   Facility contact: [employer]                 Phone:                  Health Care POA: Ashley Howard  Transportation contact phone#: facility transportation  Please send day of procedure:current med list and meds last taken that day, confirm nothing by mouth status from what time, Patient Demographic info( to include DNR status, problem list, allergies)   RN contact name/phone#:     Santiago Glad [phone]                        and Fax #: [phone]  Bring Insurance card and picture ID Leave all jewelry and other valuables at place where living( no metal or rings to be worn) No contact lens Women-no make-up, no lotions,perfumes,powders Men-no colognes,lotions  Any questions day of procedure,call Endoscopy unit-[phone]!   Sent from :[facility]                   Phone:[phone]                   Fax:[phone]  Sent by :RN Kristian Covey, RN

## 2017-07-11 NOTE — Progress Notes (Signed)
Updated instructions faxed to 2 different fax numbers with fax confirmation. Also LVM with pt. Nurse to assure she received fax.

## 2017-07-18 ENCOUNTER — Ambulatory Visit (HOSPITAL_COMMUNITY): Payer: Medicare Other | Admitting: Anesthesiology

## 2017-07-18 ENCOUNTER — Other Ambulatory Visit: Payer: Self-pay

## 2017-07-18 ENCOUNTER — Encounter (HOSPITAL_COMMUNITY)
Admission: RE | Disposition: A | Discharge status: Home / Self Care | Payer: Self-pay | Source: Ambulatory Visit | Attending: Gastroenterology

## 2017-07-18 ENCOUNTER — Telehealth: Payer: Self-pay | Admitting: Gastroenterology

## 2017-07-18 ENCOUNTER — Encounter (HOSPITAL_COMMUNITY): Payer: Self-pay | Admitting: Certified Registered Nurse Anesthetist

## 2017-07-18 ENCOUNTER — Ambulatory Visit (HOSPITAL_COMMUNITY)
Admission: RE | Admit: 2017-07-18 | Discharge: 2017-07-18 | Disposition: A | Discharge status: Home / Self Care | Payer: Medicare Other | Source: Ambulatory Visit | Attending: Gastroenterology | Admitting: Gastroenterology

## 2017-07-18 DIAGNOSIS — D509 Iron deficiency anemia, unspecified: Secondary | ICD-10-CM | POA: Insufficient documentation

## 2017-07-18 DIAGNOSIS — K5730 Diverticulosis of large intestine without perforation or abscess without bleeding: Secondary | ICD-10-CM | POA: Diagnosis not present

## 2017-07-18 DIAGNOSIS — J42 Unspecified chronic bronchitis: Secondary | ICD-10-CM | POA: Insufficient documentation

## 2017-07-18 DIAGNOSIS — Z89432 Acquired absence of left foot: Secondary | ICD-10-CM | POA: Insufficient documentation

## 2017-07-18 DIAGNOSIS — D649 Anemia, unspecified: Secondary | ICD-10-CM

## 2017-07-18 DIAGNOSIS — K219 Gastro-esophageal reflux disease without esophagitis: Secondary | ICD-10-CM | POA: Diagnosis not present

## 2017-07-18 DIAGNOSIS — Z91048 Other nonmedicinal substance allergy status: Secondary | ICD-10-CM | POA: Insufficient documentation

## 2017-07-18 DIAGNOSIS — K625 Hemorrhage of anus and rectum: Secondary | ICD-10-CM | POA: Diagnosis not present

## 2017-07-18 DIAGNOSIS — K642 Third degree hemorrhoids: Secondary | ICD-10-CM | POA: Diagnosis not present

## 2017-07-18 DIAGNOSIS — K6289 Other specified diseases of anus and rectum: Secondary | ICD-10-CM

## 2017-07-18 DIAGNOSIS — Z87891 Personal history of nicotine dependence: Secondary | ICD-10-CM | POA: Diagnosis not present

## 2017-07-18 DIAGNOSIS — Z79899 Other long term (current) drug therapy: Secondary | ICD-10-CM | POA: Diagnosis not present

## 2017-07-18 HISTORY — PX: COLONOSCOPY WITH PROPOFOL: SHX5780

## 2017-07-18 SURGERY — COLONOSCOPY WITH PROPOFOL
Anesthesia: Monitor Anesthesia Care

## 2017-07-18 MED ORDER — PROPOFOL 500 MG/50ML IV EMUL
INTRAVENOUS | Status: DC | PRN
  Administered 2017-07-18: 100 ug/kg/min via INTRAVENOUS

## 2017-07-18 MED ORDER — PROPOFOL 10 MG/ML IV BOLUS
INTRAVENOUS | Status: AC
  Filled 2017-07-18: qty 60

## 2017-07-18 MED ORDER — LACTATED RINGERS IV SOLN
INTRAVENOUS | Status: DC
  Administered 2017-07-18 (×2): via INTRAVENOUS

## 2017-07-18 MED ORDER — PROPOFOL 10 MG/ML IV BOLUS
INTRAVENOUS | Status: DC | PRN
  Administered 2017-07-18: 50 mg via INTRAVENOUS

## 2017-07-18 MED ORDER — LIDOCAINE 2% (20 MG/ML) 5 ML SYRINGE
INTRAMUSCULAR | Status: DC | PRN
  Administered 2017-07-18: 8 mg via INTRAVENOUS

## 2017-07-18 SURGICAL SUPPLY — 22 items

## 2017-07-18 NOTE — Anesthesia Preprocedure Evaluation (Addendum)
Anesthesia Evaluation  Patient identified by MRN, date of birth, ID band Patient awake    Reviewed: Allergy & Precautions, H&P , Patient's Chart, lab work & pertinent test results, reviewed documented beta blocker date and time   Airway Mallampati: II  TM Distance: >3 FB Neck ROM: full    Dental no notable dental hx.    Pulmonary former smoker,    Pulmonary exam normal breath sounds clear to auscultation       Cardiovascular  Rhythm:regular Rate:Normal     Neuro/Psych    GI/Hepatic   Endo/Other    Renal/GU      Musculoskeletal   Abdominal   Peds  Hematology   Anesthesia Other Findings PONV Gallstones    GERD (gastroesophageal reflux disease)   H/O hiatal hernia    Scoliosis   Bowel incontinence    Status post partial amputation of foot, left (HCC)   H/O small bowel obstructiowhich caused aspiration pneumonia and septic shock  History of blood transfusion   Reproductive/Obstetrics                            Anesthesia Physical Anesthesia Plan  ASA: II  Anesthesia Plan: MAC   Post-op Pain Management:    Induction: Intravenous  PONV Risk Score and Plan:   Airway Management Planned: Mask and Natural Airway  Additional Equipment:   Intra-op Plan:   Post-operative Plan:   Informed Consent: I have reviewed the patients History and Physical, chart, labs and discussed the procedure including the risks, benefits and alternatives for the proposed anesthesia with the patient or authorized representative who has indicated his/her understanding and acceptance.   Dental Advisory Given  Plan Discussed with: CRNA and Surgeon  Anesthesia Plan Comments:        Anesthesia Quick Evaluation

## 2017-07-18 NOTE — Telephone Encounter (Signed)
Ashley Howard,   1) please fax a copy of this patient's colonoscopy report to the referring physician at her nursing facility.  2) send a referral to Dr. Leighton Ruff or Dr. Nadeen Landau at [facility] for internal hemorrhoids with abnormal tissue on colonoscopy.

## 2017-07-18 NOTE — Interval H&P Note (Signed)
History and Physical Interval Note:  07/18/2017 9:55 AM  Ashley Howard  has presented today for surgery, with the diagnosis of Rectal bleeding  The various methods of treatment have been discussed with the patient and family. After consideration of risks, benefits and other options for treatment, the patient has consented to  Procedure(s): COLONOSCOPY WITH PROPOFOL (N/A) as a surgical intervention .  The patient's history has been reviewed, patient examined, no change in status, stable for surgery.  I have reviewed the patient's chart and labs.  Questions were answered to the patient's satisfaction.     Nelida Meuse III

## 2017-07-18 NOTE — H&P (Signed)
History:  This patient presents for endoscopic testing for anemia and rectal bleeding.  Ashley Howard Referring physician: [name], MD  Past Medical History: Past Medical History:  Diagnosis Date  . Arthritis    "qwhere" (08/21/2016)  . Aspiration pneumonia ([place]) 07/2016  . Bowel incontinence   . Chronic back pain   . Chronic bronchitis ([place])   . Fluid retention in legs    wears compression stocking R leg  . Gallstones   . GERD (gastroesophageal reflux disease)   . H/O hiatal hernia   . H/O small bowel obstruction 07/2016   admitted to [facility] 1/2 -07/24/16 with SBO which caused aspiration pneumonia and septic shock/notes 08/21/2016  . History of blood transfusion 1975   "had a reaction"  . Incontinence of urine    wears adult briefs  . PONV (postoperative nausea and vomiting)    "doesn't bother me so bad anymore" (08/21/2016)  . Scoliosis   . Seasonal allergies   . Shortness of breath    with pain  . Small bowel obstruction ([place]) 08/21/2016  . Status post partial amputation of foot, left ([place])   . Weight loss, unintentional    40 lbs in last year     Past Surgical History: Past Surgical History:  Procedure Laterality Date  . ABDOMINAL HYSTERECTOMY  07/1973   "still have one of my ovaries"  . AMPUTATION  11/07/2011   Procedure: AMPUTATION RAY;  Surgeon: Newt Minion, MD;  Location: [facility];  Service: Orthopedics;  Laterality: Left;  Left Foot 1st Ray Amputation  . APPENDECTOMY    . BLADDER REPAIR  ~ 02/1974   "hole in my bladder & a place that hadn't healed up from 07/1973 OR for hysterectomy"  . BREAST LUMPECTOMY Bilateral 07/1987   "benign"  . CATARACT EXTRACTION W/ INTRAOCULAR LENS  IMPLANT, BILATERAL Bilateral   . CHOLECYSTECTOMY N/A 08/26/2016   Procedure: LAPAROSCOPIC CHOLECYSTECTOMY;  Surgeon: Georganna Skeans, MD;  Location: [facility];  Service: General;  Laterality: N/A;  . DOBUTAMINE STRESS ECHO    . ERCP N/A 08/24/2016   Procedure: ENDOSCOPIC RETROGRADE  CHOLANGIOPANCREATOGRAPHY (ERCP);  Surgeon: Milus Banister, MD;  Location: [facility];  Service: Endoscopy;  Laterality: N/A;  . EXPLORATORY LAPAROTOMY  07/1973   "to straighten out my guts 7 days after hysterectomy"  . KNEE ARTHROSCOPY Left   . LAPAROSCOPIC LYSIS OF ADHESIONS N/A 08/26/2016   Procedure: LAPAROSCOPIC LYSIS OF ADHESIONS FOR 20 MINUTES;  Surgeon: Georganna Skeans, MD;  Location: [facility];  Service: General;  Laterality: N/A;  . TONSILLECTOMY AND ADENOIDECTOMY      Allergies: Allergies  Allergen Reactions  . Tape Other (See Comments)    Paper tape - blisters    Outpatient Meds: Current Facility-Administered Medications  Medication Dose Route Frequency Provider Last Rate Last Dose  . lactated ringers infusion   Intravenous Continuous Danis, Estill Cotta III, MD          ___________________________________________________________________ Objective   Exam:  Pulse 83   Temp 98.5 F (36.9 C) (Oral)   Resp 10   Ht 4\' 11"  (1.499 m)   Wt 109 lb (49.4 kg)   SpO2 100%   BMI 22.02 kg/m    CV: RRR without murmur, S1/S2, no JVD, no peripheral edema  Resp: clear to auscultation bilaterally, normal RR and effort noted  GI: soft, no tenderness, with active bowel sounds. No guarding or palpable organomegaly noted.  Neuro: awake, alert and oriented x 3. Normal gross motor function  and fluent speech   Assessment:  Anemia and rectal bleeding  Plan:  colonoscopy   Nelida Meuse III

## 2017-07-18 NOTE — Discharge Instructions (Signed)
Monitored Anesthesia Care, Care After These instructions provide you with information about caring for yourself after your procedure. Your health care provider may also give you more specific instructions. Your treatment has been planned according to current medical practices, but problems sometimes occur. Call your health care provider if you have any problems or questions after your procedure. What can I expect after the procedure? After your procedure, it is common to:  Feel sleepy for several hours.  Feel clumsy and have poor balance for several hours.  Feel forgetful about what happened after the procedure.  Have poor judgment for several hours.  Feel nauseous or vomit.  Have a sore throat if you had a breathing tube during the procedure.  Follow these instructions at home: For at least 24 hours after the procedure:   Do not: ? Participate in activities in which you could fall or become injured. ? Drive. ? Use heavy machinery. ? Drink alcohol. ? Take sleeping pills or medicines that cause drowsiness. ? Make important decisions or sign legal documents. ? Take care of children on your own.  Rest. Eating and drinking  Follow the diet that is recommended by your health care provider.  If you vomit, drink water, juice, or soup when you can drink without vomiting.  Make sure you have little or no nausea before eating solid foods. General instructions  Have a responsible adult stay with you until you are awake and alert.  Take over-the-counter and prescription medicines only as told by your health care provider.  If you smoke, do not smoke without supervision.  Keep all follow-up visits as told by your health care provider. This is important. Contact a health care provider if:  You keep feeling nauseous or you keep vomiting.  You feel light-headed.  You develop a rash.  You have a fever. Get help right away if:  You have trouble breathing. This information is  not intended to replace advice given to you by your health care provider. Make sure you discuss any questions you have with your health care provider. Document Released: 10/22/2015 Document Revised: 02/21/2016 Document Reviewed: 10/22/2015 Elsevier Interactive Patient Education  2018 [place] HAD AN ENDOSCOPIC PROCEDURE TODAY: Refer to the procedure report and other information in the discharge instructions given to you for any specific questions about what was found during the examination. If this information does not answer your questions, please call [facility] office at [phone] to clarify.   YOU SHOULD EXPECT: Some feelings of bloating in the abdomen. Passage of more gas than usual. Walking can help get rid of the air that was put into your GI tract during the procedure and reduce the bloating. If you had a lower endoscopy (such as a colonoscopy or flexible sigmoidoscopy) you may notice spotting of blood in your stool or on the toilet paper. Some abdominal soreness may be present for a day or two, also.  DIET: Your first meal following the procedure should be a light meal and then it is ok to progress to your normal diet. A half-sandwich or bowl of soup is an example of a good first meal. Heavy or fried foods are harder to digest and may make you feel nauseous or bloated. Drink plenty of fluids but you should avoid alcoholic beverages for 24 hours. If you had a esophageal dilation, please see attached instructions for diet.    ACTIVITY: Your care partner should take you home directly after the procedure. You should plan to  take it easy, moving slowly for the rest of the day. You can resume normal activity the day after the procedure however YOU SHOULD NOT DRIVE, use power tools, machinery or perform tasks that involve climbing or major physical exertion for 24 hours (because of the sedation medicines used during the test).   SYMPTOMS TO REPORT IMMEDIATELY: A gastroenterologist can  be reached at any hour. Please call [phone]  for any of the following symptoms:  Following lower endoscopy (colonoscopy, flexible sigmoidoscopy) Excessive amounts of blood in the stool  Significant tenderness, worsening of abdominal pains  Swelling of the abdomen that is new, acute  Fever of 100 or higher  Following upper endoscopy (EGD, EUS, ERCP, esophageal dilation) Vomiting of blood or coffee ground material  New, significant abdominal pain  New, significant chest pain or pain under the shoulder blades  Painful or persistently difficult swallowing  New shortness of breath  Black, tarry-looking or red, bloody stools  FOLLOW UP:  If any biopsies were taken you will be contacted by phone or by letter within the next 1-3 weeks. Call [phone]  if you have not heard about the biopsies in 3 weeks.  Please also call with any specific questions about appointments or follow up tests.

## 2017-07-18 NOTE — Transfer of Care (Signed)
Immediate Anesthesia Transfer of Care Note  Patient: Ashley Howard  Procedure(s) Performed: COLONOSCOPY WITH PROPOFOL (N/A )  Patient Location: PACU and Endoscopy Unit  Anesthesia Type:MAC  Level of Consciousness: awake and drowsy  Airway & Oxygen Therapy: Patient Spontanous Breathing  Post-op Assessment: Report given to RN and Post -op Vital signs reviewed and stable  Post vital signs: Reviewed and stable  Last Vitals:  Vitals:   07/18/17 0948 07/18/17 0949  Pulse: 83   Resp: 10   Temp:  36.9 C  SpO2: 100%     Last Pain:  Vitals:   07/18/17 0949  TempSrc: Oral         Complications: No apparent anesthesia complications

## 2017-07-18 NOTE — Anesthesia Postprocedure Evaluation (Signed)
Anesthesia Post Note  Patient: Ashley Howard  Procedure(s) Performed: COLONOSCOPY WITH PROPOFOL (N/A )     Patient location during evaluation: PACU Anesthesia Type: MAC Level of consciousness: awake and alert Pain management: pain level controlled Vital Signs Assessment: post-procedure vital signs reviewed and stable Respiratory status: spontaneous breathing, nonlabored ventilation, respiratory function stable and patient connected to nasal cannula oxygen Cardiovascular status: stable and blood pressure returned to baseline Postop Assessment: no apparent nausea or vomiting Anesthetic complications: no    Last Vitals:  Vitals:   07/18/17 1115 07/18/17 1120  BP: (!) 148/67   Pulse: 67 72  Resp: 15 18  Temp:    SpO2: 99% 100%    Last Pain:  Vitals:   07/18/17 1044  TempSrc: Oral                 Barth Trella EDWARD

## 2017-07-18 NOTE — Op Note (Signed)
[facility] Patient Name: Ashley Howard Procedure Date: 07/18/2017 MRN: [id number] Attending MD: Estill Cotta. Loletha Carrow , MD Date of Birth: 10/15/1945 CSN: [id number] Age: 72 Admit Type: Outpatient Procedure:                Colonoscopy Indications:              Rectal bleeding, Unexplained anemia (patient given                            iron, but it is not clear from available records                            that the patient was iron deficient) Providers:                Estill Cotta. Loletha Carrow, MD, Angus Seller, William Dalton, Technician Referring MD:              Medicines:                Monitored Anesthesia Care Complications:            No immediate complications. Estimated Blood Loss:     Estimated blood loss: none. Procedure:                Pre-Anesthesia Assessment:                           - Prior to the procedure, a History and Physical                            was performed, and patient medications and                            allergies were reviewed. The patient's tolerance of                            previous anesthesia was also reviewed. The risks                            and benefits of the procedure and the sedation                            options and risks were discussed with the patient.                            All questions were answered, and informed consent                            was obtained. Prior Anticoagulants: The patient has                            taken no previous anticoagulant or antiplatelet  agents. ASA Grade Assessment: III - A patient with                            severe systemic disease. After reviewing the risks                            and benefits, the patient was deemed in                            satisfactory condition to undergo the procedure.                           After obtaining informed consent, the colonoscope                            was passed  under direct vision. Throughout the                            procedure, the patient's blood pressure, pulse, and                            oxygen saturations were monitored continuously. The                            EC-[id number] ([id number]) scope was introduced through                            the anus and advanced to the the cecum, identified                            by appendiceal orifice and ileocecal valve. The                            colonoscopy was performed with difficulty due to                            multiple diverticula in the colon, significant                            looping and a tortuous colon. Successful completion                            of the procedure was aided by using manual pressure                            and withdrawing the scope and replacing with the                            pediatric colonoscope. The patient tolerated the                            procedure well. The quality of the bowel  preparation was good. The ileocecal valve,                            appendiceal orifice, and rectum were photographed.                            The quality of the bowel preparation was evaluated                            using the BBPS [facility] Bowel Preparation Scale)                            with scores of: Right Colon = 2, Transverse Colon =                            2 and Left Colon = 2. The total BBPS score equals                            6, after lavage. Findings:      The digital rectal exam findings include decreased sphincter tone.      Many diverticula were found in the left colon. There was severe       associated tortuosity, making scope passage very challenging (see above).      Internal hemorrhoids were found. The hemorrhoids were Grade III       (internal hemorrhoids that prolapse but require manual reduction). One       of the hemorrhoids had irregular overlying mucosa.      The exam was otherwise without  abnormality on direct and retroflexion       views. Impression:               - Decreased sphincter tone found on digital rectal                            exam.                           - Diverticulosis in the left colon.                           - Internal hemorrhoids with abnormal mucosa.                            Hemorrhoids appear to explain the patient's rectal                            bleeding.                           - The examination was otherwise normal on direct                            and retroflexion views.                           - No specimens collected. Moderate Sedation:  MAC sedation used Recommendation:           - Patient has a contact number available for                            emergencies. The signs and symptoms of potential                            delayed complications were discussed with the                            patient. Return to normal activities tomorrow.                            Written discharge instructions were provided to the                            patient.                           - Resume previous diet.                           - Continue present medications.                           - No future routine colonoscopy due to age.                           - Return to referring physician to work up other                            causes of anemia.                           Referral will be placed to colo-rectal surgery to                            evaluate the abnormal mucosa on hemorrhoidal tissue. Procedure Code(s):        --- Professional ---                           [phone], Colonoscopy, flexible; diagnostic, including                            collection of specimen(s) by brushing or washing,                            when performed (separate procedure) Diagnosis Code(s):        --- Professional ---                           K62.89, Other specified diseases of anus and rectum                           K64.2,  Third degree hemorrhoids  K62.5, Hemorrhage of anus and rectum                           D50.9, Iron deficiency anemia, unspecified                           K57.30, Diverticulosis of large intestine without                            perforation or abscess without bleeding CPT copyright 2016 American Medical Association. All rights reserved. The codes documented in this report are preliminary and upon coder review may  be revised to meet current compliance requirements.  L. Loletha Carrow, MD 07/18/2017 10:55:40 AM This report has been signed electronically. Number of Addenda: 0

## 2017-07-21 ENCOUNTER — Encounter (HOSPITAL_COMMUNITY): Payer: Self-pay | Admitting: Gastroenterology

## 2017-07-21 NOTE — Telephone Encounter (Signed)
Records faxed to [facility] on 07-18-2017. Records faxed to [facility] on 07-21-2017. Will await appointment info from [place]

## 2017-07-24 DIAGNOSIS — Z79899 Other long term (current) drug therapy: Secondary | ICD-10-CM | POA: Diagnosis not present

## 2017-07-30 NOTE — Telephone Encounter (Signed)
Pt has an appointment on 08-07-2017 with Dr Dema Severin at 9 am.

## 2017-08-07 ENCOUNTER — Ambulatory Visit: Payer: Self-pay | Admitting: Surgery

## 2017-08-07 DIAGNOSIS — K620 Anal polyp: Secondary | ICD-10-CM | POA: Diagnosis not present

## 2017-08-07 NOTE — H&P (Signed)
History of Present Illness Ashley Gave M. Hetal Proano MD; 08/07/2017 9:17 AM) Patient words: CC: Anal canal polypoid lesion seen on colonoscopy HPI: Ashley Howard is a 72yo female referred following colonoscopy 07/18/17 for an anal canal polypoid lesion seen. She has a multi-yr hx of occasional BRBPR with BMs, particularly when constipated. She also has a hx of tissue prolapse that she is able to self reduce - describes it as a "hemorrhoid." Hx of incontinence to both solid and liquid stool as well as gas. Wears a diaper daily. She also c/o mucoid discharge from the anus. Lives in an assisted living facility following a home break in that left her feeling unsafe at home. Denies personal hx of cancer, no prior abnormal pap smears; had breast lumps in 1989 removed but was told they were benign. PMH: HTN (OA, allergies, neuropathy controlled on neurontin) PSH: No prior anorectal procedures FHx: Denies FHx of malignancy Social: Denies use of tobacco/EtOH/drugs ROS: A comprehensive 10 system review of systems was completed with the patient and pertinent findings as noted above.  The patient is a 72 year old female.   Allergies Alean Rinne, [place]; 08/07/2017 8:53 AM) Tape 1"X5yd *MEDICAL DEVICES AND SUPPLIES*   Allergies Reconciled    Medication History Alean Rinne, RMA; 08/07/2017 8:53 AM) Meloxicam  (7.5MG  Tablet, Oral daily) Active. Polyethylene Glycol 3350  (Oral as needed) Active. Gabapentin  (100MG  Capsule, Oral) Active. Omeprazole  (20MG  Capsule DR, Oral) Active. Meloxicam  (15MG  Tablet, Oral) Active. Procto-Med HC  (2.5% Cream, Rectal) Active. Risedronate Sodium  (35MG  Tablet, Oral) Active.  Vitals Alean Rinne RMA; 08/07/2017 8:52 AM) 08/07/2017 8:52 AM Weight: 122.2 lb   Height: 59 in  Body Surface Area: 1.5 m   Body Mass Index: 24.68 kg/m   Temp.: 98.8 F    Pulse: 79 (Regular)    BP: 130/78 (Sitting, Left Arm, Standard)       Physical Exam Ashley Gave M. Ryliegh Mcduffey MD; 08/07/2017  9:16 AM) The physical exam findings are as follows: Note: Constitutional: No acute distress; conversant; no deformities Eyes: Moist conjunctiva; no lid lag; anicteric sclerae; pupils equal round and reactive to light Neck: Trachea midline; no palpable thyromegaly Lungs: Normal respiratory effort; no tactile fremitus CV: Regular rate and rhythm; no palpable thrill; no pitting edema GI: Abdomen soft, nontender, nondistended; no palpable hepatosplenomegaly Anorectal: No palpable masses; decreased anal tone Anoscopy: internal hemorrhoids; right anterior internal hemorrhoid with polypoid appearance and abnormal appearing overlying mucosa MSK: Normal gait; no clubbing/cyanosis Psychiatric: Appropriate affect; alert and oriented 3 Lymphatic: No palpable cervical or axillary lymphadenopathy **A chaperone, Lennart Pall, was present for the entire physical exam    Assessment & Plan Ashley Gave M. Tiya Schrupp MD; 08/07/2017 9:20 AM) ANAL POLYP (K62.0) Impression: Ashley Howard is a pleasant 72yo female with an anal canal polypoid lesion on the pedicle of what appears to be a right anterior hemorrhoid -Medical clearance -Schedule for EUA, excision of anal canal polypoid lesion following medical clearance -Position: Prone -The anatomy and physiology of the anal canal and GI tract was discussed at length with the patient. The pathophysiology of anal canal polypoid lesions and hemorrhoids was discussed at length with associated pictures and illustrations. The possibility that this could be a malignancy/cancer was discussed as well -The planned procedure, material risks (including, but not limited to, pain, bleeding, infection, scarring, need for blood transfusion, damage to anal sphincter, worsening incontinence of gas and/or stool, need for additional procedures, recurrence, pneumonia, heart attack, stroke, death) benefits and alternatives to surgery were discussed  at length. I noted a good probability that the  procedure would help improve her symptoms - namely mucoid discharge from anus and provide pathologic assessment of the polypoid tissue. The patient's questions were answered to her satisfaction and she elected to proceed with surgery Current Plans ANOSCOPY, DIAGNOSTIC (16109) (Anoscopy: internal hemorrhoids; right anterior internal hemorrhoid with polypoid appearance and abnormal appearing overlying mucosa) Signed electronically by Ileana Roup, MD (08/07/2017 9:21 AM)

## 2017-08-25 DIAGNOSIS — M545 Low back pain: Secondary | ICD-10-CM | POA: Diagnosis not present

## 2017-08-25 DIAGNOSIS — Z972 Presence of dental prosthetic device (complete) (partial): Secondary | ICD-10-CM | POA: Diagnosis not present

## 2017-08-25 DIAGNOSIS — I5089 Other heart failure: Secondary | ICD-10-CM | POA: Diagnosis not present

## 2017-08-25 DIAGNOSIS — R2689 Other abnormalities of gait and mobility: Secondary | ICD-10-CM | POA: Diagnosis not present

## 2017-08-25 DIAGNOSIS — G8929 Other chronic pain: Secondary | ICD-10-CM | POA: Diagnosis not present

## 2017-09-07 ENCOUNTER — Encounter (HOSPITAL_COMMUNITY): Payer: Self-pay | Admitting: Emergency Medicine

## 2017-09-07 ENCOUNTER — Emergency Department (HOSPITAL_COMMUNITY)
Admission: EM | Admit: 2017-09-07 | Discharge: 2017-09-07 | Disposition: A | Discharge status: Home / Self Care | Payer: Medicare Other | Attending: Emergency Medicine | Admitting: Emergency Medicine

## 2017-09-07 ENCOUNTER — Emergency Department (HOSPITAL_COMMUNITY): Payer: Medicare Other

## 2017-09-07 DIAGNOSIS — R509 Fever, unspecified: Secondary | ICD-10-CM

## 2017-09-07 DIAGNOSIS — R918 Other nonspecific abnormal finding of lung field: Secondary | ICD-10-CM | POA: Diagnosis not present

## 2017-09-07 DIAGNOSIS — J069 Acute upper respiratory infection, unspecified: Secondary | ICD-10-CM | POA: Diagnosis not present

## 2017-09-07 DIAGNOSIS — Z87891 Personal history of nicotine dependence: Secondary | ICD-10-CM | POA: Diagnosis not present

## 2017-09-07 DIAGNOSIS — Z79899 Other long term (current) drug therapy: Secondary | ICD-10-CM | POA: Diagnosis not present

## 2017-09-07 DIAGNOSIS — J189 Pneumonia, unspecified organism: Secondary | ICD-10-CM | POA: Diagnosis not present

## 2017-09-07 LAB — URINALYSIS, ROUTINE W REFLEX MICROSCOPIC
Bilirubin Urine: NEGATIVE
GLUCOSE, UA: NEGATIVE mg/dL
Hgb urine dipstick: NEGATIVE
Ketones, ur: NEGATIVE mg/dL
LEUKOCYTES UA: NEGATIVE
Nitrite: NEGATIVE
PROTEIN: NEGATIVE mg/dL
Specific Gravity, Urine: 1.008 (ref 1.005–1.030)
pH: 7 (ref 5.0–8.0)

## 2017-09-07 LAB — COMPREHENSIVE METABOLIC PANEL
ALT: 22 U/L (ref 14–54)
ANION GAP: 13 (ref 5–15)
AST: 50 U/L — AB (ref 15–41)
Albumin: 3.6 g/dL (ref 3.5–5.0)
Alkaline Phosphatase: 64 U/L (ref 38–126)
BILIRUBIN TOTAL: 0.7 mg/dL (ref 0.3–1.2)
BUN: 12 mg/dL (ref 6–20)
CHLORIDE: 96 mmol/L — AB (ref 101–111)
CO2: 23 mmol/L (ref 22–32)
Calcium: 9.2 mg/dL (ref 8.9–10.3)
Creatinine, Ser: 0.81 mg/dL (ref 0.44–1.00)
Glucose, Bld: 106 mg/dL — ABNORMAL HIGH (ref 65–99)
POTASSIUM: 4 mmol/L (ref 3.5–5.1)
Sodium: 132 mmol/L — ABNORMAL LOW (ref 135–145)
Total Protein: 6.3 g/dL — ABNORMAL LOW (ref 6.5–8.1)

## 2017-09-07 LAB — CBC WITH DIFFERENTIAL/PLATELET
Basophils Absolute: 0 10*3/uL (ref 0.0–0.1)
Basophils Relative: 0 %
EOS PCT: 0 %
Eosinophils Absolute: 0 10*3/uL (ref 0.0–0.7)
HCT: 33.1 % — ABNORMAL LOW (ref 36.0–46.0)
HEMOGLOBIN: 10.9 g/dL — AB (ref 12.0–15.0)
LYMPHS ABS: 0.9 10*3/uL (ref 0.7–4.0)
LYMPHS PCT: 6 %
MCH: 32.1 pg (ref 26.0–34.0)
MCHC: 32.9 g/dL (ref 30.0–36.0)
MCV: 97.4 fL (ref 78.0–100.0)
Monocytes Absolute: 0.8 10*3/uL (ref 0.1–1.0)
Monocytes Relative: 6 %
NEUTROS ABS: 12.2 10*3/uL — AB (ref 1.7–7.7)
NEUTROS PCT: 88 %
PLATELETS: 255 10*3/uL (ref 150–400)
RBC: 3.4 MIL/uL — AB (ref 3.87–5.11)
RDW: 12.9 % (ref 11.5–15.5)
WBC: 13.9 10*3/uL — AB (ref 4.0–10.5)

## 2017-09-07 LAB — I-STAT CG4 LACTIC ACID, ED: LACTIC ACID, VENOUS: 1.85 mmol/L (ref 0.5–1.9)

## 2017-09-07 LAB — INFLUENZA PANEL BY PCR (TYPE A & B)
INFLBPCR: NEGATIVE
Influenza A By PCR: NEGATIVE

## 2017-09-07 MED ORDER — AZITHROMYCIN 250 MG PO TABS
ORAL_TABLET | ORAL | 0 refills | Status: DC
Start: 2017-09-08 — End: 2018-09-01

## 2017-09-07 MED ORDER — AZITHROMYCIN 250 MG PO TABS
500.0000 mg | ORAL_TABLET | Freq: Once | ORAL | Status: AC
Start: 2017-09-07 — End: 2017-09-07
  Administered 2017-09-07: 500 mg via ORAL
  Filled 2017-09-07: qty 2

## 2017-09-07 MED ORDER — IBUPROFEN 400 MG PO TABS
600.0000 mg | ORAL_TABLET | Freq: Once | ORAL | Status: AC
  Administered 2017-09-07: 600 mg via ORAL
  Filled 2017-09-07: qty 1

## 2017-09-07 MED ORDER — MELOXICAM 15 MG PO TABS
15.0000 mg | ORAL_TABLET | Freq: Once | ORAL | Status: AC
  Administered 2017-09-07: 15 mg via ORAL
  Filled 2017-09-07: qty 1

## 2017-09-07 MED ORDER — SODIUM CHLORIDE 0.9 % IV BOLUS (SEPSIS)
1000.0000 mL | Freq: Once | INTRAVENOUS | Status: AC
  Administered 2017-09-07: 1000 mL via INTRAVENOUS

## 2017-09-07 NOTE — ED Triage Notes (Signed)
Patient transported from [facility] for fever and chills that started this morning. EMS reports patient had oral temperature this morning of 103.26F, was given "two tablets of tylenol", EMS states nurse giving report was unsure if tablets were 325mg  or 500 mg. Oral temperature 100.59F on arrival. No other complaints at this time. Patient alert and oriented and in no apparent distress at this time.

## 2017-09-07 NOTE — ED Notes (Signed)
Pt ambulated throughout hallway with assist of walker per her normal. O2 saturation began at 94%, dropped to 89% but returned to 94% shortly after. Fluctuated between 89% and 95% throughout ambulation. Pt denied any dizziness, lightheadedness, or shortness of breath throughout.

## 2017-09-07 NOTE — ED Provider Notes (Signed)
[facility] EMERGENCY DEPARTMENT Provider Note   CSN: [id number] Arrival date & time: 09/07/17  2229     History   Chief Complaint Chief Complaint  Patient presents with  . Fever    HPI Ashley Howard is a 72 y.o. female.  Pt presents to the ED today with fever and chills that started this morning.  The pt's temp was 103.5 and facility gave her tylenol.  The pt's temp is down to 100.7.  The pt c/o back pain which is chronic.  She denies any pain, n/v/d.      Past Medical History:  Diagnosis Date  . Arthritis    "qwhere" (08/21/2016)  . Aspiration pneumonia ([place]) 07/2016  . Bowel incontinence   . Chronic back pain   . Chronic bronchitis ([place])   . Fluid retention in legs    wears compression stocking R leg  . Gallstones   . GERD (gastroesophageal reflux disease)   . H/O hiatal hernia   . H/O small bowel obstruction 07/2016   admitted to [facility] 1/2 -07/24/16 with SBO which caused aspiration pneumonia and septic shock/notes 08/21/2016  . History of blood transfusion 1975   "had a reaction"  . Incontinence of urine    wears adult briefs  . PONV (postoperative nausea and vomiting)    "doesn't bother me so bad anymore" (08/21/2016)  . Scoliosis   . Seasonal allergies   . Shortness of breath    with pain  . Small bowel obstruction ([place]) 08/21/2016  . Status post partial amputation of foot, left ([place])   . Weight loss, unintentional    40 lbs in last year    Patient Active Problem List   Diagnosis Date Noted  . Anemia   . Rectal bleeding   . SBO (small bowel obstruction) ([place]) 08/21/2016  . Choledocholithiasis 08/21/2016  . Urinary incontinence 08/21/2016  . GERD (gastroesophageal reflux disease) 08/21/2016  . Neuropathic pain 08/21/2016  . Hyperglycemia 08/21/2016  . History of aspiration pneumonia 07/30/2016  . Septic shock ([place])   . Protein-calorie malnutrition, severe 07/20/2016  . Small bowel obstruction ([place]) 07/16/2016  . Dehydration  07/16/2016  . Abnormality of gait 01/04/2013    Past Surgical History:  Procedure Laterality Date  . ABDOMINAL HYSTERECTOMY  07/1973   "still have one of my ovaries"  . AMPUTATION  11/07/2011   Procedure: AMPUTATION RAY;  Surgeon: Newt Minion, MD;  Location: [facility];  Service: Orthopedics;  Laterality: Left;  Left Foot 1st Ray Amputation  . APPENDECTOMY    . BLADDER REPAIR  ~ 02/1974   "hole in my bladder & a place that hadn't healed up from 07/1973 OR for hysterectomy"  . BREAST LUMPECTOMY Bilateral 07/1987   "benign"  . CATARACT EXTRACTION W/ INTRAOCULAR LENS  IMPLANT, BILATERAL Bilateral   . CHOLECYSTECTOMY N/A 08/26/2016   Procedure: LAPAROSCOPIC CHOLECYSTECTOMY;  Surgeon: Georganna Skeans, MD;  Location: [facility];  Service: General;  Laterality: N/A;  . COLONOSCOPY WITH PROPOFOL N/A 07/18/2017   Procedure: COLONOSCOPY WITH PROPOFOL;  Surgeon: Doran Stabler, MD;  Location: [facility];  Service: Gastroenterology;  Laterality: N/A;  . DOBUTAMINE STRESS ECHO    . ERCP N/A 08/24/2016   Procedure: ENDOSCOPIC RETROGRADE CHOLANGIOPANCREATOGRAPHY (ERCP);  Surgeon: Milus Banister, MD;  Location: [facility];  Service: Endoscopy;  Laterality: N/A;  . EXPLORATORY LAPAROTOMY  07/1973   "to straighten out my guts 7 days after hysterectomy"  . KNEE ARTHROSCOPY Left   . LAPAROSCOPIC LYSIS  OF ADHESIONS N/A 08/26/2016   Procedure: LAPAROSCOPIC LYSIS OF ADHESIONS FOR 20 MINUTES;  Surgeon: Georganna Skeans, MD;  Location: [facility];  Service: General;  Laterality: N/A;  . TONSILLECTOMY AND ADENOIDECTOMY      OB History    No data available       Home Medications    Prior to Admission medications   Medication Sig Start Date End Date Taking? Authorizing Provider  azithromycin (ZITHROMAX) 250 MG tablet Take  1 every day until finished. 09/08/17   Isla Pence, MD  Calcium Carb-Cholecalciferol (CALCIUM 500 + D3) 500-600 MG-UNIT TABS Take 1 tablet by mouth 2 (two) times daily.    [provider]  docusate sodium (COLACE) 100 MG capsule Take 100 mg by mouth daily.    [provider]  ferrous sulfate 325 (65 FE) MG tablet Take 325 mg by mouth daily.    [provider]  fexofenadine (ALLEGRA) 180 MG tablet Take 180 mg by mouth daily.    [provider]  gabapentin (NEURONTIN) 100 MG capsule Take 100 mg by mouth at bedtime.    [provider]  HYDROcodone-acetaminophen (NORCO/VICODIN) 5-325 MG tablet Take 1 tablet by mouth every 6 (six) hours as needed for moderate pain.    [provider]  hydrocortisone (ANUSOL-HC) 2.5 % rectal cream Place 1 application rectally 2 (two) times daily as needed (hemorrhoidal pain).    [provider]  LORazepam (ATIVAN) 1 MG tablet Take 1 mg by mouth 2 (two) times daily as needed for anxiety.    [provider]  meloxicam (MOBIC) 15 MG tablet Take 15 mg by mouth daily.    [provider]  mirabegron ER (MYRBETRIQ) 25 MG TB24 tablet Take 25 mg by mouth 2 (two) times daily.     [provider]  Multiple Vitamin (MULTIVITAMIN WITH MINERALS) TABS tablet Take 1 tablet by mouth daily.    [provider]  omeprazole (PRILOSEC) 20 MG capsule Take 20 mg by mouth daily.    [provider]  polyethylene glycol (MIRALAX / GLYCOLAX) packet Take 17 g by mouth daily as needed for mild constipation.     [provider]  risedronate (ACTONEL) 35 MG tablet Take 35 mg by mouth every 7 (seven) days. with water on empty stomach, nothing by mouth or lie down for next 30 minutes.    [provider]  saccharomyces boulardii (FLORASTOR) 250 MG capsule Take 250 mg by mouth 2 (two) times daily.    [provider]  simethicone (MYLICON) 151 MG chewable tablet Chew 250 mg by mouth 2 (two) times daily.     [provider]    Family History Family History  Problem Relation Age of Onset  . Hypertension Mother   . Cancer Father   . Diabetes Neg Hx   .  Stroke Neg Hx     Social History Social History   Tobacco Use  . Smoking status: Former Smoker    Packs/day: 1.00    Years: 54.00    Pack years: 54.00    Types: Cigarettes    Last attempt to quit: 07/15/2016    Years since quitting: 1.1  . Smokeless tobacco: Never Used  Substance Use Topics  . Alcohol use: No    Comment: 08/21/2016 "nothing since ~ 04/2016"  . Drug use: No     Allergies   Tape   Review of Systems Review of Systems  Constitutional: Positive for chills and fever.  Musculoskeletal: Positive  for back pain.  All other systems reviewed and are negative.    Physical Exam Updated Vital Signs BP 107/68   Pulse 79   Temp 99.3 F (37.4 C) (Oral)   Resp 14   SpO2 98%   Physical Exam  Constitutional: She is oriented to person, place, and time. She appears well-developed and well-nourished.  HENT:  Head: Normocephalic and atraumatic.  Right Ear: External ear normal.  Left Ear: External ear normal.  Nose: Nose normal.  Mouth/Throat: Oropharynx is clear and moist.  Eyes: Conjunctivae and EOM are normal. Pupils are equal, round, and reactive to light.  Neck: Normal range of motion. Neck supple.  Cardiovascular: Normal rate, regular rhythm, normal heart sounds and intact distal pulses.  Pulmonary/Chest: Effort normal and breath sounds normal.  Abdominal: Soft. Bowel sounds are normal.  Musculoskeletal: Normal range of motion.  Neurological: She is alert and oriented to person, place, and time.  Skin: Skin is warm and dry. Capillary refill takes less than 2 seconds.  Psychiatric: She has a normal mood and affect. Her behavior is normal. Judgment and thought content normal.  Nursing note and vitals reviewed.    ED Treatments / Results  Labs (all labs ordered are listed, but only abnormal results are displayed) Labs Reviewed  COMPREHENSIVE METABOLIC PANEL - Abnormal; Notable for the following components:      Result Value   Sodium 132 (*)    Chloride 96  (*)    Glucose, Bld 106 (*)    Total Protein 6.3 (*)    AST 50 (*)    All other components within normal limits  CBC WITH DIFFERENTIAL/PLATELET - Abnormal; Notable for the following components:   WBC 13.9 (*)    RBC 3.40 (*)    Hemoglobin 10.9 (*)    HCT 33.1 (*)    Neutro Abs 12.2 (*)    All other components within normal limits  URINE CULTURE  CULTURE, BLOOD (ROUTINE X 2)  CULTURE, BLOOD (ROUTINE X 2)  URINALYSIS, ROUTINE W REFLEX MICROSCOPIC  INFLUENZA PANEL BY PCR (TYPE A & B)  I-STAT CG4 LACTIC ACID, ED  I-STAT CG4 LACTIC ACID, ED    EKG  EKG Interpretation  Date/Time:  Sunday September 07 2017 08:56:08 EST Ventricular Rate:  82 PR Interval:    QRS Duration: 104 QT Interval:  370 QTC Calculation: 433 R Axis:   -13 Text Interpretation:  Sinus rhythm Anterior infarct, old Confirmed by Isla Pence [phone]) on 09/07/2017 8:58:47 AM       Radiology Dg Chest 2 View  Result Date: 09/07/2017 CLINICAL DATA:  Fever EXAM: CHEST  2 VIEW COMPARISON:  08/21/2016 chest radiograph. FINDINGS: Stable cardiomediastinal silhouette with normal heart size. No pneumothorax. No pleural effusion. No pulmonary edema. Minimal hazy right lung base opacity. IMPRESSION: Minimal hazy right lung base opacity, favor mild atelectasis, cannot exclude mild aspiration or pneumonia. Recommend follow-up short-term PA and lateral chest radiographs. Electronically Signed   By: Ilona Sorrel M.D.   On: 09/07/2017 09:25    Procedures Procedures (including critical care time)  Medications Ordered in ED Medications  azithromycin (ZITHROMAX) tablet 500 mg (not administered)  sodium chloride 0.9 % bolus 1,000 mL (0 mLs Intravenous Stopped 09/07/17 1013)  ibuprofen (ADVIL,MOTRIN) tablet 600 mg (600 mg Oral Given 09/07/17 0903)  meloxicam (MOBIC) tablet 15 mg (15 mg Oral Given 09/07/17 1225)     Initial Impression / Assessment and Plan / ED Course  I have reviewed the triage vital signs and the  nursing  notes.  Pertinent labs & imaging results that were available during my care of the patient were reviewed by me and considered in my medical decision making (see chart for details).   Pt is feeling much better.  She was able to ambulate with her walker.  O2 sats dropped briefly, but came right back up.  She was not symptomatic.  Pt's source for fever likely viral, although cxr can't r/o pna, so I will treat her with zithromax. She will be given first dose prior to d/c.  She is stable for d/c.    Final Clinical Impressions(s) / ED Diagnoses   Final diagnoses:  Viral upper respiratory tract infection  Fever, unspecified fever cause    ED Discharge Orders        Ordered    azithromycin (ZITHROMAX) 250 MG tablet     09/07/17 1248       Isla Pence, MD 09/07/17 1249

## 2017-09-07 NOTE — ED Notes (Signed)
Patient transported to X-ray 

## 2017-09-08 DIAGNOSIS — R2689 Other abnormalities of gait and mobility: Secondary | ICD-10-CM | POA: Diagnosis not present

## 2017-09-08 DIAGNOSIS — M545 Low back pain: Secondary | ICD-10-CM | POA: Diagnosis not present

## 2017-09-08 DIAGNOSIS — K649 Unspecified hemorrhoids: Secondary | ICD-10-CM | POA: Diagnosis not present

## 2017-09-08 DIAGNOSIS — I5089 Other heart failure: Secondary | ICD-10-CM | POA: Diagnosis not present

## 2017-09-08 LAB — URINE CULTURE

## 2017-09-12 LAB — CULTURE, BLOOD (ROUTINE X 2)
Culture: NO GROWTH
SPECIAL REQUESTS: ADEQUATE

## 2017-09-26 ENCOUNTER — Encounter (HOSPITAL_COMMUNITY): Payer: Self-pay | Admitting: *Deleted

## 2017-09-26 ENCOUNTER — Other Ambulatory Visit: Payer: Self-pay

## 2017-09-26 NOTE — Progress Notes (Addendum)
Preop instructions for: Ashley Howard                         Date of Birth : 15-Jul-1946                           Date of Procedure: 10/02/2017       Doctor: Dr Nadeen Landau  Time to arrive at [facility]: 0900 Report to: Admitting  Procedure: Any procedure time changes, MD office will notify you!   Do not eat or drink past midnight the night before your procedure.(To include any tube feedings-must be discontinued) REMINDER: FLEETS ENEMA - GIVE AM  BEFORE SURGERY    Take these morning medications only with sips of water.(or give through gastrostomy or feeding tube). Allegra, Ativan if needed, Myrbetriq , prilosec     Facility contact:  Education officer, community                  Phone:  [place]:  Transportation contact phone#: [place]  Please send day of procedure:current med list and meds last taken that day, confirm nothing by mouth status from what time, Patient Demographic info( to include DNR status, problem list, allergies)   RN contact name/phone#: Gillian Shields RN                              and Fax #: [phone]  Bring Insurance card and picture ID Leave all jewelry and other valuables at place where living( no metal or rings to be worn) No contact lens Women-no make-up, no lotions,perfumes,powders   Any questions day of procedure,call  Short Stay -[phone]   Sent from :[facility]                   [place]                   Fax:[phone]  Sent by :Gillian Shields RN

## 2017-09-26 NOTE — Progress Notes (Signed)
08/25/2017 Office visit note with Dr Reymundo Poll on chart. - with clearance in note.

## 2017-09-29 NOTE — Progress Notes (Signed)
Per office of [facility], spoke with Mardene Celeste, assistant of Dr Dema Severin, fleets enema am of surgery to be done by [facility].  Mardene Celeste stated she had sent instructions to [facility]. Nurse informed Johny Sax I would put on preop instructions to be faxed to [facility] .

## 2017-10-02 ENCOUNTER — Ambulatory Visit (HOSPITAL_COMMUNITY)
Admission: RE | Admit: 2017-10-02 | Discharge: 2017-10-02 | Disposition: A | Discharge status: Skilled Nursing Facility | Payer: Medicare Other | Source: Ambulatory Visit | Attending: Surgery | Admitting: Surgery

## 2017-10-02 ENCOUNTER — Ambulatory Visit (HOSPITAL_COMMUNITY): Payer: Medicare Other | Admitting: Anesthesiology

## 2017-10-02 ENCOUNTER — Encounter (HOSPITAL_COMMUNITY): Payer: Self-pay | Admitting: *Deleted

## 2017-10-02 ENCOUNTER — Encounter (HOSPITAL_COMMUNITY)
Admission: RE | Disposition: A | Discharge status: Skilled Nursing Facility | Payer: Self-pay | Source: Ambulatory Visit | Attending: Surgery

## 2017-10-02 DIAGNOSIS — Z87891 Personal history of nicotine dependence: Secondary | ICD-10-CM | POA: Insufficient documentation

## 2017-10-02 DIAGNOSIS — D649 Anemia, unspecified: Secondary | ICD-10-CM | POA: Diagnosis not present

## 2017-10-02 DIAGNOSIS — K6282 Dysplasia of anus: Secondary | ICD-10-CM | POA: Diagnosis not present

## 2017-10-02 DIAGNOSIS — Z888 Allergy status to other drugs, medicaments and biological substances status: Secondary | ICD-10-CM | POA: Insufficient documentation

## 2017-10-02 DIAGNOSIS — M1990 Unspecified osteoarthritis, unspecified site: Secondary | ICD-10-CM | POA: Diagnosis not present

## 2017-10-02 DIAGNOSIS — K642 Third degree hemorrhoids: Secondary | ICD-10-CM | POA: Diagnosis not present

## 2017-10-02 DIAGNOSIS — I10 Essential (primary) hypertension: Secondary | ICD-10-CM | POA: Insufficient documentation

## 2017-10-02 DIAGNOSIS — Z01818 Encounter for other preprocedural examination: Secondary | ICD-10-CM

## 2017-10-02 DIAGNOSIS — K620 Anal polyp: Secondary | ICD-10-CM | POA: Diagnosis not present

## 2017-10-02 DIAGNOSIS — Z79899 Other long term (current) drug therapy: Secondary | ICD-10-CM | POA: Insufficient documentation

## 2017-10-02 DIAGNOSIS — G629 Polyneuropathy, unspecified: Secondary | ICD-10-CM | POA: Insufficient documentation

## 2017-10-02 DIAGNOSIS — R739 Hyperglycemia, unspecified: Secondary | ICD-10-CM | POA: Diagnosis not present

## 2017-10-02 DIAGNOSIS — K219 Gastro-esophageal reflux disease without esophagitis: Secondary | ICD-10-CM | POA: Diagnosis not present

## 2017-10-02 DIAGNOSIS — K629 Disease of anus and rectum, unspecified: Secondary | ICD-10-CM | POA: Diagnosis present

## 2017-10-02 DIAGNOSIS — K6289 Other specified diseases of anus and rectum: Secondary | ICD-10-CM | POA: Diagnosis not present

## 2017-10-02 HISTORY — DX: Other chronic pain: G89.29

## 2017-10-02 HISTORY — DX: Low back pain, unspecified: M54.50

## 2017-10-02 HISTORY — DX: Anemia, unspecified: D64.9

## 2017-10-02 HISTORY — DX: Other specified health status: Z78.9

## 2017-10-02 HISTORY — DX: Low back pain: M54.5

## 2017-10-02 HISTORY — DX: Unspecified hearing loss, unspecified ear: H91.90

## 2017-10-02 HISTORY — PX: LESION REMOVAL: SHX5196

## 2017-10-02 HISTORY — DX: Unspecified hemorrhoids: K64.9

## 2017-10-02 HISTORY — DX: Unspecified protein-calorie malnutrition: E46

## 2017-10-02 HISTORY — PX: RECTAL EXAM UNDER ANESTHESIA: SHX6399

## 2017-10-02 HISTORY — DX: Other specified disorders of teeth and supporting structures: K08.89

## 2017-10-02 HISTORY — DX: Unspecified abnormalities of gait and mobility: R26.9

## 2017-10-02 LAB — COMPREHENSIVE METABOLIC PANEL
ALBUMIN: 4 g/dL (ref 3.5–5.0)
ALK PHOS: 52 U/L (ref 38–126)
ALT: 17 U/L (ref 14–54)
AST: 28 U/L (ref 15–41)
Anion gap: 9 (ref 5–15)
BILIRUBIN TOTAL: 0.7 mg/dL (ref 0.3–1.2)
BUN: 13 mg/dL (ref 6–20)
CALCIUM: 8.8 mg/dL — AB (ref 8.9–10.3)
CO2: 24 mmol/L (ref 22–32)
Chloride: 105 mmol/L (ref 101–111)
Creatinine, Ser: 0.59 mg/dL (ref 0.44–1.00)
GFR calc Af Amer: >60 mL/min (ref >60)
GFR calc non Af Amer: >60 mL/min (ref >60)
GLUCOSE: 88 mg/dL (ref 65–99)
POTASSIUM: 3.7 mmol/L (ref 3.5–5.1)
Sodium: 138 mmol/L (ref 135–145)
TOTAL PROTEIN: 6.8 g/dL (ref 6.5–8.1)

## 2017-10-02 LAB — CBC WITH DIFFERENTIAL/PLATELET
BASOS ABS: 0.1 10*3/uL (ref 0.0–0.1)
BASOS PCT: 1 %
Eosinophils Absolute: 0.1 10*3/uL (ref 0.0–0.7)
Eosinophils Relative: 1 %
HEMATOCRIT: 33.4 % — AB (ref 36.0–46.0)
Hemoglobin: 11.2 g/dL — ABNORMAL LOW (ref 12.0–15.0)
Lymphocytes Relative: 10 %
Lymphs Abs: 0.9 10*3/uL (ref 0.7–4.0)
MCH: 32.8 pg (ref 26.0–34.0)
MCHC: 33.5 g/dL (ref 30.0–36.0)
MCV: 97.9 fL (ref 78.0–100.0)
Monocytes Absolute: 0.9 10*3/uL (ref 0.1–1.0)
Monocytes Relative: 9 %
NEUTROS ABS: 7.2 10*3/uL (ref 1.7–7.7)
NEUTROS PCT: 79 %
Platelets: 258 10*3/uL (ref 150–400)
RBC: 3.41 MIL/uL — AB (ref 3.87–5.11)
RDW: 12.9 % (ref 11.5–15.5)
WBC: 9.2 10*3/uL (ref 4.0–10.5)

## 2017-10-02 LAB — PROTIME-INR
INR: 0.95
Prothrombin Time: 12.6 seconds (ref 11.4–15.2)

## 2017-10-02 LAB — APTT: APTT: 35 s (ref 24–36)

## 2017-10-02 SURGERY — EXAM UNDER ANESTHESIA, RECTUM
Anesthesia: Monitor Anesthesia Care

## 2017-10-02 MED ORDER — LACTATED RINGERS IV SOLN
INTRAVENOUS | Status: DC
  Administered 2017-10-02 (×2): via INTRAVENOUS

## 2017-10-02 MED ORDER — LIDOCAINE 2% (20 MG/ML) 5 ML SYRINGE
INTRAMUSCULAR | Status: AC
  Filled 2017-10-02: qty 10

## 2017-10-02 MED ORDER — HYDROCODONE-ACETAMINOPHEN 5-325 MG PO TABS: 2.0000 | ORAL_TABLET | Freq: Four times a day (QID) | ORAL | 0 refills | Status: DC | PRN

## 2017-10-02 MED ORDER — FENTANYL CITRATE (PF) 100 MCG/2ML IJ SOLN
INTRAMUSCULAR | Status: AC
  Filled 2017-10-02: qty 2

## 2017-10-02 MED ORDER — SODIUM CHLORIDE 0.9 % IJ SOLN
INTRAMUSCULAR | Status: AC
  Filled 2017-10-02: qty 50

## 2017-10-02 MED ORDER — EPHEDRINE 5 MG/ML INJ
INTRAVENOUS | Status: AC
  Filled 2017-10-02: qty 10

## 2017-10-02 MED ORDER — BACITRACIN-NEOMYCIN-POLYMYXIN 400-5-5000 EX OINT
TOPICAL_OINTMENT | CUTANEOUS | Status: AC
  Filled 2017-10-02: qty 1

## 2017-10-02 MED ORDER — BUPIVACAINE-EPINEPHRINE (PF) 0.5% -1:200000 IJ SOLN
INTRAMUSCULAR | Status: AC
  Filled 2017-10-02: qty 30

## 2017-10-02 MED ORDER — 0.9 % SODIUM CHLORIDE (POUR BTL) OPTIME
TOPICAL | Status: DC | PRN
  Administered 2017-10-02: 1000 mL

## 2017-10-02 MED ORDER — CHLORHEXIDINE GLUCONATE CLOTH 2 % EX PADS
6.0000 | MEDICATED_PAD | Freq: Once | CUTANEOUS | Status: AC
  Administered 2017-10-02: 6 via TOPICAL

## 2017-10-02 MED ORDER — PROPOFOL 10 MG/ML IV BOLUS
INTRAVENOUS | Status: AC
  Filled 2017-10-02: qty 20

## 2017-10-02 MED ORDER — ACETAMINOPHEN 500 MG PO TABS
1000.0000 mg | ORAL_TABLET | ORAL | Status: AC
  Administered 2017-10-02: 1000 mg via ORAL
  Filled 2017-10-02: qty 2

## 2017-10-02 MED ORDER — LIDOCAINE 2% (20 MG/ML) 5 ML SYRINGE
INTRAMUSCULAR | Status: AC
  Filled 2017-10-02: qty 5

## 2017-10-02 MED ORDER — PROPOFOL 10 MG/ML IV BOLUS
INTRAVENOUS | Status: DC | PRN
  Administered 2017-10-02 (×2): 20 mg via INTRAVENOUS
  Administered 2017-10-02: 10 mg via INTRAVENOUS

## 2017-10-02 MED ORDER — SODIUM CHLORIDE 0.9 % IJ SOLN
INTRAMUSCULAR | Status: DC | PRN
  Administered 2017-10-02: 15 mL

## 2017-10-02 MED ORDER — FENTANYL CITRATE (PF) 100 MCG/2ML IJ SOLN
INTRAMUSCULAR | Status: DC | PRN
  Administered 2017-10-02 (×2): 25 ug via INTRAVENOUS
  Administered 2017-10-02: 50 ug via INTRAVENOUS

## 2017-10-02 MED ORDER — BUPIVACAINE-EPINEPHRINE 0.5% -1:200000 IJ SOLN
INTRAMUSCULAR | Status: DC | PRN
  Administered 2017-10-02: 15 mL

## 2017-10-02 MED ORDER — BUPIVACAINE LIPOSOME 1.3 % IJ SUSP
20.0000 mL | Freq: Once | INTRAMUSCULAR | Status: AC
  Administered 2017-10-02: 20 mL
  Filled 2017-10-02: qty 20

## 2017-10-02 MED ORDER — DEXAMETHASONE SODIUM PHOSPHATE 10 MG/ML IJ SOLN
INTRAMUSCULAR | Status: AC
  Filled 2017-10-02: qty 1

## 2017-10-02 MED ORDER — EPHEDRINE SULFATE-NACL 50-0.9 MG/10ML-% IV SOSY
PREFILLED_SYRINGE | INTRAVENOUS | Status: DC | PRN
  Administered 2017-10-02 (×2): 5 mg via INTRAVENOUS
  Administered 2017-10-02: 10 mg via INTRAVENOUS
  Administered 2017-10-02: 5 mg via INTRAVENOUS

## 2017-10-02 MED ORDER — SUGAMMADEX SODIUM 200 MG/2ML IV SOLN
INTRAVENOUS | Status: AC
  Filled 2017-10-02: qty 2

## 2017-10-02 MED ORDER — DEXAMETHASONE SODIUM PHOSPHATE 10 MG/ML IJ SOLN
INTRAMUSCULAR | Status: AC
  Filled 2017-10-02: qty 2

## 2017-10-02 MED ORDER — ONDANSETRON HCL 4 MG/2ML IJ SOLN
INTRAMUSCULAR | Status: AC
  Filled 2017-10-02: qty 4

## 2017-10-02 MED ORDER — FENTANYL CITRATE (PF) 100 MCG/2ML IJ SOLN: 25.0000 ug | INTRAMUSCULAR | Status: DC | PRN

## 2017-10-02 MED ORDER — FLEET ENEMA 7-19 GM/118ML RE ENEM
1.0000 | ENEMA | Freq: Once | RECTAL | Status: AC
  Administered 2017-10-02: 1 via RECTAL
  Filled 2017-10-02: qty 1

## 2017-10-02 MED ORDER — ONDANSETRON HCL 4 MG/2ML IJ SOLN
INTRAMUSCULAR | Status: AC
  Filled 2017-10-02: qty 2

## 2017-10-02 MED ORDER — DEXAMETHASONE SODIUM PHOSPHATE 10 MG/ML IJ SOLN
INTRAMUSCULAR | Status: DC | PRN
  Administered 2017-10-02 (×2): 5 mg via INTRAVENOUS

## 2017-10-02 MED ORDER — ONDANSETRON HCL 4 MG/2ML IJ SOLN
INTRAMUSCULAR | Status: DC | PRN
  Administered 2017-10-02 (×2): 2 mg via INTRAVENOUS

## 2017-10-02 MED ORDER — ROCURONIUM BROMIDE 10 MG/ML (PF) SYRINGE
PREFILLED_SYRINGE | INTRAVENOUS | Status: AC
  Filled 2017-10-02: qty 10

## 2017-10-02 MED ORDER — PROPOFOL 500 MG/50ML IV EMUL
INTRAVENOUS | Status: DC | PRN
  Administered 2017-10-02: 100 ug/kg/min via INTRAVENOUS

## 2017-10-02 SURGICAL SUPPLY — 26 items
BRIEF STRETCH FOR OB PAD LRG (UNDERPADS AND DIAPERS) ×3 IMPLANT
DECANTER SPIKE VIAL GLASS SM (MISCELLANEOUS) ×3 IMPLANT
DRSG PAD ABDOMINAL 8X10 ST (GAUZE/BANDAGES/DRESSINGS) IMPLANT
ELECT REM PT RETURN 15FT ADLT (MISCELLANEOUS) ×3 IMPLANT
GAUZE SPONGE 4X4 12PLY STRL (GAUZE/BANDAGES/DRESSINGS) ×2 IMPLANT
GLOVE BIO SURGEON STRL SZ7.5 (GLOVE) ×3 IMPLANT
GLOVE INDICATOR 8.0 STRL GRN (GLOVE) ×3 IMPLANT
GOWN STRL REUS W/TWL XL LVL3 (GOWN DISPOSABLE) ×6 IMPLANT
KIT BASIN OR (CUSTOM PROCEDURE TRAY) ×3 IMPLANT
LUBRICANT JELLY K Y 4OZ (MISCELLANEOUS) ×3 IMPLANT
NEEDLE HYPO 22GX1.5 SAFETY (NEEDLE) ×3 IMPLANT
PACK GENERAL/GYN (CUSTOM PROCEDURE TRAY) ×3 IMPLANT
PAD ABD 8X10 STRL (GAUZE/BANDAGES/DRESSINGS) ×2 IMPLANT
SHEARS HARMONIC 9CM CVD (BLADE) IMPLANT
SPONGE HEMORRHOID 8X3CM (HEMOSTASIS) ×2 IMPLANT
SPONGE SURGIFOAM ABS GEL 100 (HEMOSTASIS) IMPLANT
SUT CHROMIC 2 0 SH (SUTURE) IMPLANT
SUT CHROMIC 3 0 SH 27 (SUTURE) IMPLANT
SUT MNCRL AB 4-0 PS2 18 (SUTURE) ×3 IMPLANT
SUT VIC AB 2-0 SH 27 (SUTURE) ×6
SUT VIC AB 2-0 SH 27X BRD (SUTURE) IMPLANT
SUT VIC AB 3-0 SH 27 (SUTURE) ×9
SUT VIC AB 3-0 SH 27X BRD (SUTURE) ×1 IMPLANT
SYR 20CC LL (SYRINGE) ×3 IMPLANT
TOWEL OR 17X26 10 PK STRL BLUE (TOWEL DISPOSABLE) ×3 IMPLANT
TOWEL OR NON WOVEN STRL DISP B (DISPOSABLE) ×3 IMPLANT

## 2017-10-02 NOTE — Anesthesia Preprocedure Evaluation (Signed)
Anesthesia Evaluation  Patient identified by MRN, date of birth, ID band Patient awake    Reviewed: Allergy & Precautions, H&P , Patient's Chart, lab work & pertinent test results, reviewed documented beta blocker date and time   Airway Mallampati: II  TM Distance: >3 FB Neck ROM: full    Dental no notable dental hx.    Pulmonary former smoker,    Pulmonary exam normal breath sounds clear to auscultation       Cardiovascular  Rhythm:regular Rate:Normal     Neuro/Psych    GI/Hepatic   Endo/Other    Renal/GU      Musculoskeletal   Abdominal   Peds  Hematology   Anesthesia Other Findings PONV Gallstones    GERD (gastroesophageal reflux disease)   H/O hiatal hernia    Scoliosis   Bowel incontinence    Status post partial amputation of foot, left (HCC)   H/O small bowel obstructiowhich caused aspiration pneumonia and septic shock  History of blood transfusion   Reproductive/Obstetrics                             Anesthesia Physical  Anesthesia Plan  ASA: II  Anesthesia Plan: MAC   Post-op Pain Management:    Induction: Intravenous  PONV Risk Score and Plan: 2 and Treatment may vary due to age or medical condition and Ondansetron  Airway Management Planned: Mask and Natural Airway  Additional Equipment:   Intra-op Plan:   Post-operative Plan:   Informed Consent: I have reviewed the patients History and Physical, chart, labs and discussed the procedure including the risks, benefits and alternatives for the proposed anesthesia with the patient or authorized representative who has indicated his/her understanding and acceptance.   Dental Advisory Given  Plan Discussed with: CRNA and Surgeon  Anesthesia Plan Comments:         Anesthesia Quick Evaluation

## 2017-10-02 NOTE — Anesthesia Procedure Notes (Signed)
Procedure Name: MAC Date/Time: 10/02/2017 1:09 PM Performed by: Deliah Boston, CRNA Pre-anesthesia Checklist: Patient identified, Emergency Drugs available, Suction available, Patient being monitored and Timeout performed Patient Re-evaluated:Patient Re-evaluated prior to induction Oxygen Delivery Method: Simple face mask Placement Confirmation: positive ETCO2,  CO2 detector and breath sounds checked- equal and bilateral

## 2017-10-02 NOTE — H&P (Signed)
CC: Anal canal polypoid lesion seen on colonoscopy  HPI: Ashley Howard is a 72yo female referred following colonoscopy 07/18/17 for an anal canal polypoid lesion seen. She has a multi-yr hx of occasional BRBPR with BMs, particularly when constipated. She also has a hx of tissue prolapse that she is able to self reduce - describes it as a "hemorrhoid." Hx of incontinence to both solid and liquid stool as well as gas. Wears a diaper daily. She also c/o mucoid discharge from the anus. Lives in an assisted living facility following a home break in that left her feeling unsafe at home. Denies personal hx of cancer, no prior abnormal pap smears; had breast lumps in 1989 removed but was told they were benign.  PMH: HTN (OA, allergies, neuropathy controlled on neurontin) PSH: No prior anorectal procedures FHx: Denies FHx of malignancy Social: Denies use of tobacco/EtOH/drugs ROS: A comprehensive 10 system review of systems was completed with the patient and pertinent findings as noted above.   Allergies Tape 1"X5yd *MEDICAL DEVICES AND SUPPLIES*  Allergies Reconciled   Medication History Meloxicam (7.5MG  Tablet, Oral daily) Active. Polyethylene Glycol 3350 (Oral as needed) Active. Gabapentin (100MG  Capsule, Oral) Active. Omeprazole (20MG  Capsule DR, Oral) Active. Meloxicam (15MG  Tablet, Oral) Active. Procto-Med HC (2.5% Cream, Rectal) Active. Risedronate Sodium (35MG  Tablet, Oral) Active.  Vitals:   10/02/17 1017 10/02/17 1018  BP: (!) 145/83   Pulse: 67   Resp: 18   Temp: 98 F (36.7 C)   TempSrc: Oral   SpO2: 97%   Weight:  56.1 kg (123 lb 9.6 oz)  Height: 4\' 11"  (1.499 m)      Physical Exam Constitutional: No acute distress; conversant; no deformities Eyes: Moist conjunctiva; no lid lag; anicteric sclerae; pupils equal round and reactive to light Neck: Trachea midline; no palpable thyromegaly Lungs: Normal respiratory effort; no tactile fremitus CV: Regular rate and  rhythm; no palpable thrill; no pitting edema GI: Abdomen soft, nontender, nondistended; no palpable hepatosplenomegaly Anorectal: No palpable masses; decreased anal tone Anoscopy: internal hemorrhoids; right anterior internal hemorrhoid with polypoid appearance and abnormal appearing overlying mucosa MSK: Normal gait; no clubbing/cyanosis Psychiatric: Appropriate affect; alert and oriented 3 Lymphatic: No palpable cervical or axillary lymphadenopathy **A chaperone, Ashley Howard, was present for the entire physical exam   Assessment & Plan Ashley Howard is a pleasant 72yo female with an anal canal polypoid lesion on the pedicle of what appears to be a right anterior hemorrhoid  -OR today for EUA, excision of anal canal polypoid lesion -Position: Prone -The anatomy and physiology of the anal canal and GI tract was discussed at length with the patient. The pathophysiology of anal canal polypoid lesions and hemorrhoids was discussed at length with associated pictures and illustrations. The possibility that this could be a malignancy/cancer was discussed as well -The planned procedure, material risks (including, but not limited to, pain, bleeding, infection, scarring, need for blood transfusion, damage to anal sphincter, worsening incontinence of gas and/or stool, need for additional procedures, recurrence, pneumonia, heart attack, stroke, death) benefits and alternatives to surgery were discussed at length. I noted a good probability that the procedure would help improve her symptoms - namely mucoid discharge from anus and provide pathologic assessment of the polypoid tissue. The patient's questions were answered to her satisfaction and she elected to proceed with surgery  Sharon Mt. Dema Severin, M.D. General and Colorectal Surgery [facility]

## 2017-10-02 NOTE — Progress Notes (Signed)
Report called to Santiago Glad at [facility], [phone], with post op update. They are ready to receive patient.  Coolidge Breeze, RN 10/02/2017

## 2017-10-02 NOTE — Progress Notes (Signed)
No antibiotic ordered pre op. Called Dr Dema Severin. No antibiotic required.

## 2017-10-02 NOTE — Transfer of Care (Signed)
Immediate Anesthesia Transfer of Care Note  Patient: Ashley Howard  Procedure(s) Performed: Procedure(s): RECTAL EXAM UNDER ANESTHESIA (N/A) EXCISION OF ANAL POLYPOID LESION (N/A)  Patient Location: PACU  Anesthesia Type:MAC  Level of Consciousness: Patient easily awoken, sedated, comfortable, cooperative, following commands, responds to stimulation.   Airway & Oxygen Therapy: Patient spontaneously breathing, ventilating well, oxygen via simple oxygen mask.  Post-op Assessment: Report given to PACU RN, vital signs reviewed and stable, moving all extremities.   Post vital signs: Reviewed and stable.  Complications: No apparent anesthesia complications  Last Vitals:  Vitals Value Taken Time  BP 142/76 10/02/2017  2:02 PM  Temp 36.9 C 10/02/2017  2:02 PM  Pulse 80 10/02/2017  2:05 PM  Resp 19 10/02/2017  2:05 PM  SpO2 100 % 10/02/2017  2:05 PM  Vitals shown include unvalidated device data.  Last Pain:  Vitals:   10/02/17 1103  TempSrc:   PainSc: 2       Patients Stated Pain Goal: 3 (81/84/03 7543)  Complications: No apparent anesthesia complications

## 2017-10-02 NOTE — Discharge Instructions (Addendum)
Post Anesthesia Home Care Instructions  Activity: Get plenty of rest for the remainder of the day. A responsible individual must stay with you for 24 hours following the procedure.  For the next 24 hours, DO NOT: -Drive a car -Paediatric nurse -Drink alcoholic beverages -Take any medication unless instructed by your physician -Make any legal decisions or sign important papers.  Meals: Start with liquid foods such as gelatin or soup. Progress to regular foods as tolerated. Avoid greasy, spicy, heavy foods. If nausea and/or vomiting occur, drink only clear liquids until the nausea and/or vomiting subsides. Call your physician if vomiting continues.  Special Instructions/Symptoms: Your throat may feel dry or sore from the anesthesia or the breathing tube placed in your throat during surgery. If this causes discomfort, gargle with warm salt water. The discomfort should disappear within 24 hours.  If you had a scopolamine patch placed behind your ear for the management of post- operative nausea and/or vomiting:  1. The medication in the patch is effective for 72 hours, after which it should be removed.  Wrap patch in a tissue and discard in the trash. Wash hands thoroughly with soap and water. 2. You may remove the patch earlier than 72 hours if you experience unpleasant side effects which may include dry mouth, dizziness or visual disturbances. 3. Avoid touching the patch. Wash your hands with soap and water after contact with the patch.   ANORECTAL SURGERY: POST OP INSTRUCTIONS  1. DIET: Follow a light bland diet the first 24 hours after arrival home, such as soup, liquids, crackers, etc.  Be sure to include lots of fluids daily.  Avoid fast food or heavy meals as your are more likely to get nauseated.  Eat a low fat diet the next few days after surgery.    2. Take your usually prescribed home medications unless otherwise directed.  3. PAIN CONTROL: a. It is helpful to take an  over-the-counter pain medication regularly for the first few days/weeks.  Choose from the following that works best for you: i. Ibuprofen (Advil, etc) Three 200mg  tabs every 6 hours as needed. b. A  prescription for pain medication may have been prescribed for you at discharge.  Take your pain medication as prescribed.  This medication contains tylenol so do not take tylenol and your pain medication on the same days i. If you are having problems/concerns with the prescription medicine, please call us for further advice.  4. Avoid getting constipated.  Between the surgery and the pain medications, it is common to experience some constipation.  Increasing fluid intake (64oz of water per day) and taking a fiber supplement (such as Metamucil, Citrucel, FiberCon) 1-2 times a day regularly will usually help prevent this problem from occurring.  Take Miralax (over the counter) 1-2x/day while taking a narcotic pain medication. If no bowel movement after 48hours, you may additionally take a laxative like a bottle of Milk of Magnesia which can be purchased over the counter. Avoid enemas if possible as these are often painful.   5. Watch out for diarrhea.  If you have many loose bowel movements, simplify your diet to bland foods.  Stop any stool softeners and decrease your fiber supplement. If this worsens or does not improve, please call us.  6. Wash / shower every day.  If you were discharged with a dressing, you may remove this the day after your surgery. You may shower normally, getting soap/water on your wound, particularly after bowel movements.  7. Soaking in  a warm bath filled a couple inches ("Sitz bath") is a great way to clean the area after a bowel movement and many patients find it is a way to soothe the area.  8. ACTIVITIES as tolerated:   a. You may resume regular (light) daily activities beginning the next day--such as daily self-care, walking, climbing stairs--gradually increasing activities as  tolerated.  If you can walk 30 minutes without difficulty, it is safe to try more intense activity such as jogging, treadmill, bicycling, low-impact aerobics, etc. b. Refrain from any heavy lifting or straining for the first 2 weeks after your procedure, particularly if your surgery was for hemorrhoids. c. Avoid activities that make your pain worse d. You may drive when you are no longer taking prescription pain medication, you can comfortably wear a seatbelt, and you can safely maneuver your car and apply brakes.  9. FOLLOW UP in our office a. Please call [facility] at [phone] to set up an appointment to see your surgeon in the office for a follow-up appointment approximately 2-3 weeks after your surgery. b. Make sure that you call for this appointment the day you arrive home to insure a convenient appointment time.  9. If you have disability or family leave forms that need to be completed, you may have them completed by your primary care physician's office; for return to work instructions, please ask our office staff and they will be happy to assist you in obtaining this documentation   When to call us [phone]: 1. Poor pain control 2. Reactions / problems with new medications (rash/itching, etc)  3. Fever over 101.5 F (38.5 C) 4. Inability to urinate 5. Nausea/vomiting 6. Worsening swelling or bruising 7. Continued bleeding from incision. 8. Increased pain, redness, or drainage from the incision  The clinic staff is available to answer your questions during regular business hours (8:30am-5pm).  Please dont hesitate to call and ask to speak to one of our nurses for clinical concerns.   A surgeon from [facility] is always on call at the hospitals   If you have a medical emergency, go to the nearest emergency room or call 911.   [facility], [place], [place], [place], [place]  [address] ? MAIN: [phone] FAX [phone]  [phone] [email or web address]

## 2017-10-02 NOTE — Op Note (Signed)
10/02/2017  1:57 PM  PATIENT:  Ashley Howard  72 y.o. female  Patient Care Team: [name], MD as PCP - General (Internal Medicine)  PRE-OPERATIVE DIAGNOSIS:  ANAL CANAL POLYPOID LESION  POST-OPERATIVE DIAGNOSIS:  ANAL CANAL POLYPOID LESION  PROCEDURE:   1. Hemorrhoidectomy x2 (right anterior and right posterior hemorrhoid columns) 2. Anorectal exam under anesthesia  SURGEON:  Surgeon(s): Ileana Roup, MD  ASSISTANT: none   ANESTHESIA:   MAC + local  SPECIMEN:   1. Right anterior hemorrhoid bundle 2. Right posterior hemorrhoid bundle  DISPOSITION OF SPECIMEN:  PATHOLOGY  COUNTS:  YES  PLAN OF CARE: Discharge to her nursing facility after PACU  PATIENT DISPOSITION:  PACU - hemodynamically stable.  INDICATION:   Ashley Howard is a 72yo female referred following colonoscopy 07/18/17 for an anal canal polypoid lesion seen. She has a multi-yr hx of occasional BRBPR with BMs, particularly when constipated. She also has a hx of tissue prolapse that she is able to self reduce - describes it as a "hemorrhoid." Hx of incontinence to both solid and liquid stool as well as gas. Wears a diaper daily. She also c/o mucoid discharge from the anus. Lives in an assisted living facility following a home break in that left her feeling unsafe at home. Denies personal hx of cancer, no prior abnormal pap smears; had breast lumps in 1989 removed but was told they were benign.  She had poor anal tone on exam but notable polypoid appearance for a right anterior internal hemorrhoid with villous appearing overlying mucosa.   This corresponded to the abnormal appearing area seen on colonoscopy. Given this and uncertainty of what this represented, excision was planned in the OR for pathologic evaluation.  The anatomy and physiology of the anal canal and GI tract was discussed at length with the patient. The pathophysiology of anal canal polypoid lesions and hemorrhoids was discussed  at length with associated pictures and illustrations. The possibility that this could be a malignancy/cancer was discussed as well  The planned procedure, material risks (including, but not limited to, pain, bleeding, infection, scarring, need for blood transfusion, damage to anal sphincter, worsening incontinence of gas and/or stool, need for additional procedures, recurrence, pneumonia, heart attack, stroke, death) benefits and alternatives to surgery were discussed at length. I noted a good probability that the procedure would help improve her symptoms - namely mucoid discharge from anus and provide pathologic assessment of the polypoid tissue. The patient's questions were answered to her satisfaction and she elected to proceed with surgery  OR FINDINGS: Grade III hemorrhoids right anterior and posterior.  The hemorrhoid at the right anterior and posterior locations had this abnormal appearing mucosa overlying it with a villous appearance to it.  Given this the decision was made to perform a limited hemorrhoidectomy of these 2 abnormal appearing regions.  DESCRIPTION: The patient was identified in the preoperative holding area and taken to the OR where they were placed on the operating room table. SCDs were placed.  MAC anesthesia was induced without difficulty. The patient was then positioned in prone jackknife position with buttocks gently taped apart.  The patient was then prepped and draped in usual sterile fashion.  A surgical timeout was performed indicating the correct patient, procedure, positioning and need for preoperative antibiotics.  A rectal block was performed using Exparel+Marcaine+saline.  Digital rectal exam was performed and the only abnormal findings were the 2 columns of hemorrhoids in the right anterior and posterior positions.  A  Hill-Ferguson retractor was placed in the anal canal and circumferentially inspected.  The right anterior hemorrhoidal column was addressed first.  The  abnormal mucosa was marked with electrocautery and a pneumonectomy performed using electrocautery.  The hemorrhoidal pedicle was ligated with a 2-0 Vicryl suture.  The anal mucosa was approximated using a running 3-0 Vicryl suture.  The anoderm was approximated using a 4-0 Vicryl running suture.  The wound was irrigated and hemostasis verified.  Attention was then turned to the right posterior hemorrhoidal column with the abnormal appearing mucosa overlying it as well.  This was marked out with Bovie electrocautery.  The hemorrhoidal tissue was then excised using electrocautery.  The femoral pedicle was ligated using a 2-0 Vicryl suture.  The anal mucosa was approximated using a running 3-0 Vicryl suture.  The anoderm was approximated using a 4-0 Vicryl running suture.  The wound was irrigated and hemostasis verified.  The anal canal was then irrigated with saline and hemostasis verified.  A Gelfoam roll was then placed in the anal canal.  4 x 4 gauze were placed on top and mesh panties were used to secure the gauze.  The patient was then awakened from Rawlings anesthesia and transferred to a stretcher for transport to PACU in satisfactory condition  **This dictation was prepared using Sales executive. Any errors in transcription are unintentional.

## 2017-10-03 ENCOUNTER — Encounter (HOSPITAL_COMMUNITY): Payer: Self-pay | Admitting: Surgery

## 2017-10-13 NOTE — Anesthesia Postprocedure Evaluation (Signed)
Anesthesia Post Note  Patient: Ashley Howard  Procedure(s) Performed: RECTAL EXAM UNDER ANESTHESIA (N/A ) EXCISION OF ANAL POLYPOID LESION (N/A )     Patient location during evaluation: PACU Anesthesia Type: MAC Level of consciousness: awake and alert Pain management: pain level controlled Vital Signs Assessment: post-procedure vital signs reviewed and stable Respiratory status: spontaneous breathing, nonlabored ventilation, respiratory function stable and patient connected to nasal cannula oxygen Cardiovascular status: stable and blood pressure returned to baseline Postop Assessment: no apparent nausea or vomiting Anesthetic complications: no    Last Vitals:  Vitals:   10/02/17 1516 10/02/17 1610  BP: (!) 158/70 (!) 156/68  Pulse: 75 74  Resp: 16 16  Temp: 36.9 C 36.9 C  SpO2: 97% 96%    Last Pain:  Vitals:   10/02/17 1610  TempSrc:   PainSc: 0-No pain   Pain Goal: Patients Stated Pain Goal: 3 (10/02/17 1103)               Lyndle Herrlich EDWARD

## 2017-10-31 DIAGNOSIS — K5901 Slow transit constipation: Secondary | ICD-10-CM | POA: Diagnosis not present

## 2017-10-31 DIAGNOSIS — N3281 Overactive bladder: Secondary | ICD-10-CM | POA: Diagnosis not present

## 2017-10-31 DIAGNOSIS — M810 Age-related osteoporosis without current pathological fracture: Secondary | ICD-10-CM | POA: Diagnosis not present

## 2017-10-31 DIAGNOSIS — D508 Other iron deficiency anemias: Secondary | ICD-10-CM | POA: Diagnosis not present

## 2017-10-31 DIAGNOSIS — K219 Gastro-esophageal reflux disease without esophagitis: Secondary | ICD-10-CM | POA: Diagnosis not present

## 2017-11-10 DIAGNOSIS — G894 Chronic pain syndrome: Secondary | ICD-10-CM | POA: Diagnosis not present

## 2017-11-17 IMAGING — DX DG ABD PORTABLE 1V
1 series · 1 of 1 positions shown · non-contrast
Comparison: 07/18/2016 and 1036 hours

CLINICAL DATA: Small bowel obstruction.

EXAM:
PORTABLE ABDOMEN - 1 VIEW

[abdomen kub]
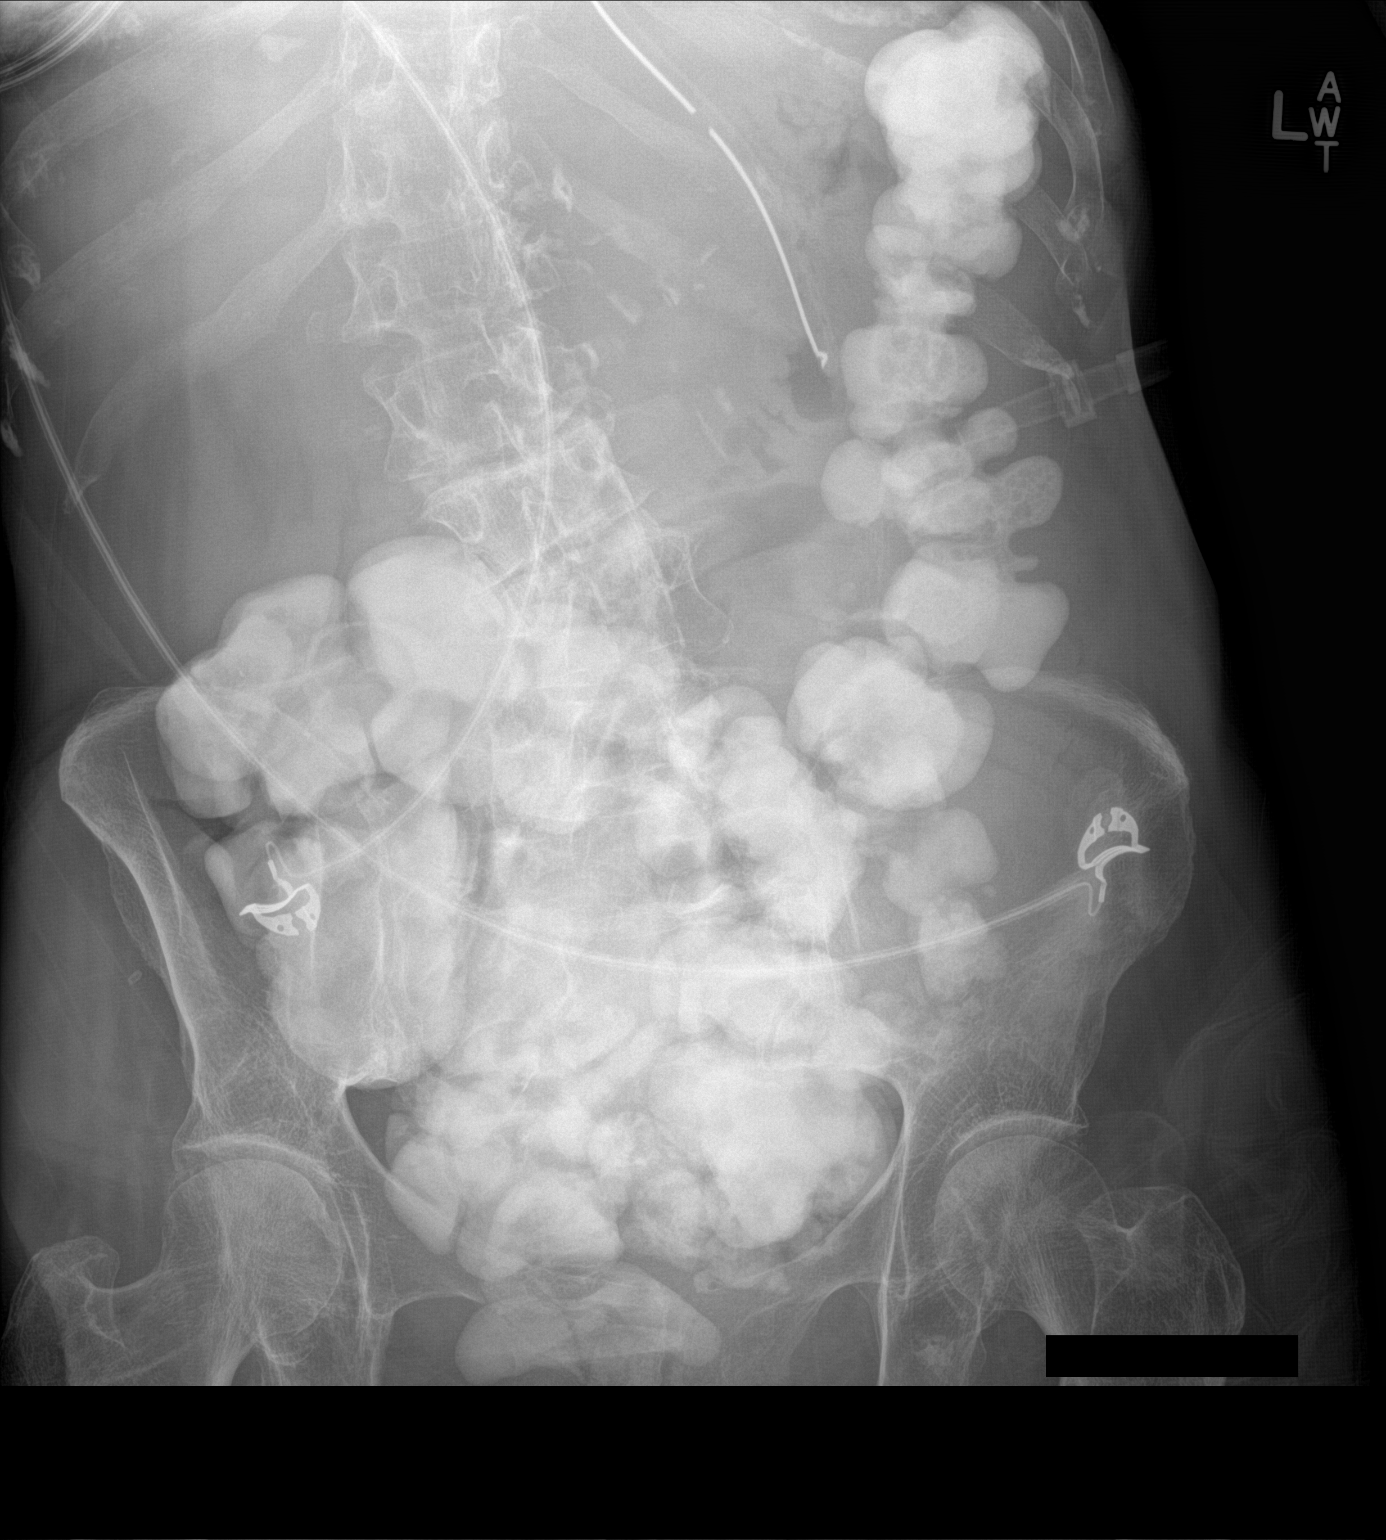

[1 of 1 positions shown; findings below may reference images not displayed]

FINDINGS: Portable AP supine view of the abdomen pelvis demonstrates oral
contrast within nonobstructed, nondistended small and large bowel
loops to the level of the rectum. Gastric tube is seen in the left
upper quadrant. Dextrorotatory scoliosis of the lumbar spine. No
acute osseous abnormality.
IMPRESSION: No bowel obstruction. Contrast is seen within nonobstructed,
nondistended small and large bowel loops to the level of the rectum.

## 2017-11-18 DIAGNOSIS — N3281 Overactive bladder: Secondary | ICD-10-CM | POA: Diagnosis not present

## 2017-11-18 IMAGING — DX DG ABD PORTABLE 1V
1 series · 1 of 1 positions shown · non-contrast
Comparison: Most recent prior radiograph 07/18/2016

CLINICAL DATA: 70-year-old female undergoing small bowel
follow-through. 24 hour delayed film.

EXAM:
PORTABLE ABDOMEN - 1 VIEW

[abdomen kub]
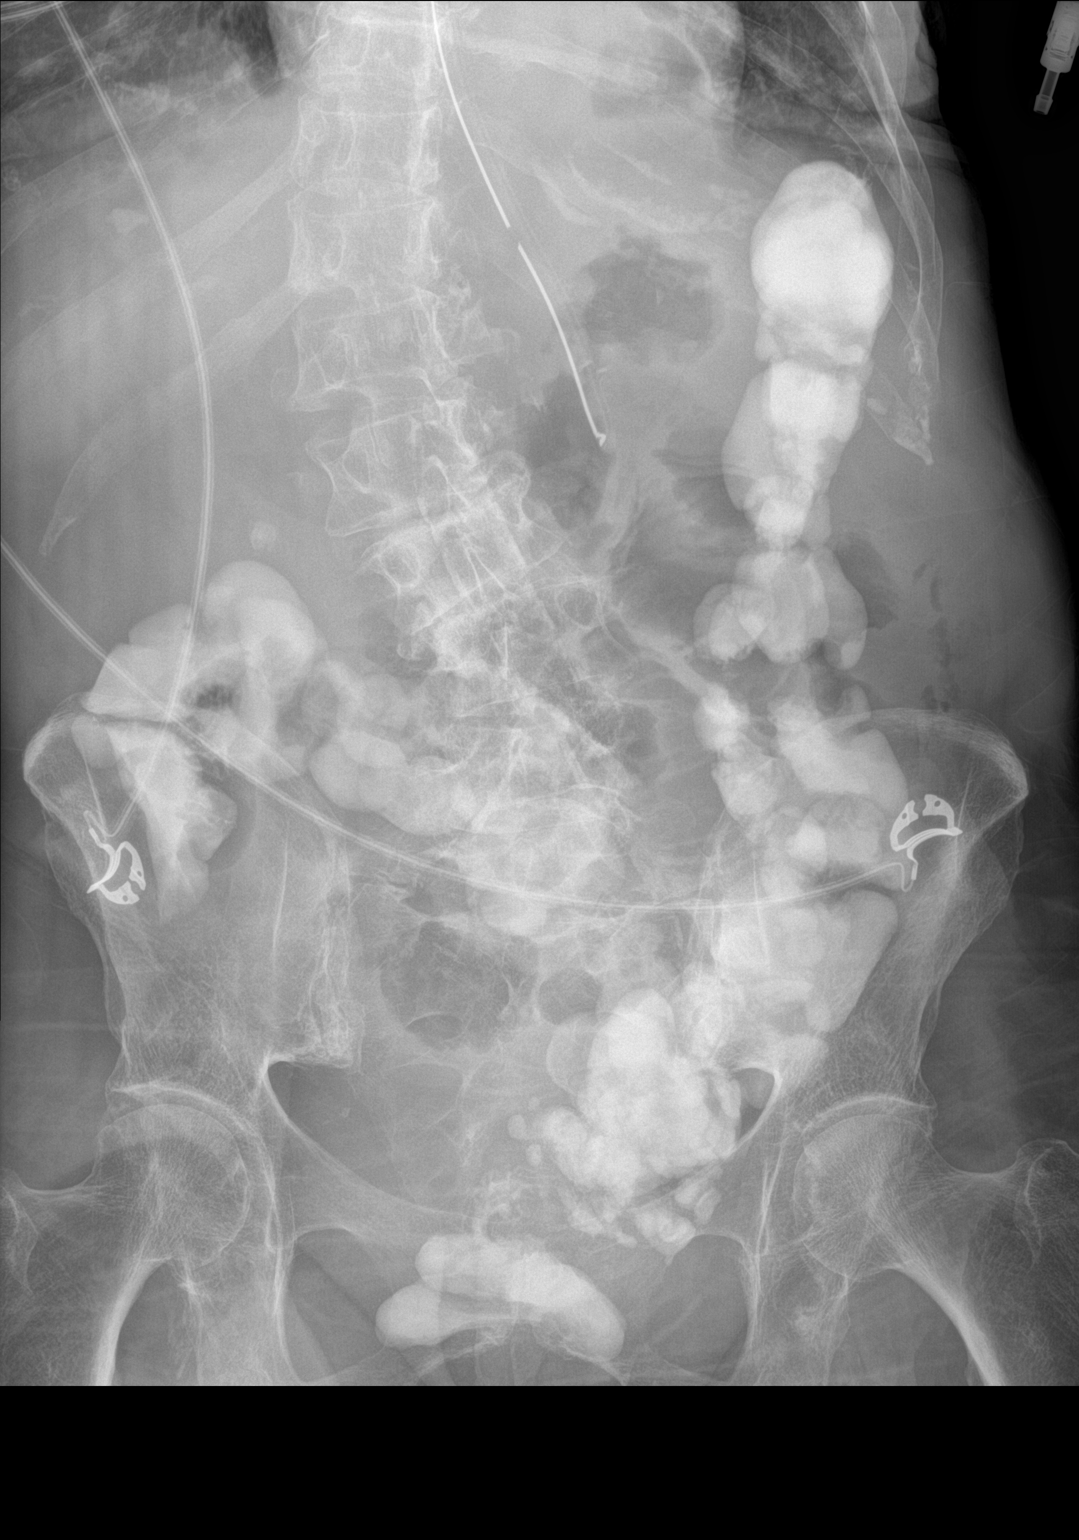

[1 of 1 positions shown; findings below may reference images not displayed]

FINDINGS: Continued distal progression of ingested oral contrast material. All
of the contrast is now within the colon. The sigmoid colon is
redundant. Scattered colonic diverticula are present. Gas can be
seen within several loops of small bowel. A nasogastric tube is
present with the tip in the stomach. Multilevel degenerative disc
disease throughout the spine with dextroconvex and rotary scoliosis.
The bones appear osteopenic.
IMPRESSION: Continued distal progression of oral contrast material. All contrast
is now within the colon.

Mild colonic diverticulosis.

## 2017-11-25 DIAGNOSIS — B351 Tinea unguium: Secondary | ICD-10-CM | POA: Diagnosis not present

## 2017-11-25 DIAGNOSIS — I739 Peripheral vascular disease, unspecified: Secondary | ICD-10-CM | POA: Diagnosis not present

## 2017-11-25 DIAGNOSIS — Q845 Enlarged and hypertrophic nails: Secondary | ICD-10-CM | POA: Diagnosis not present

## 2017-11-25 DIAGNOSIS — L603 Nail dystrophy: Secondary | ICD-10-CM | POA: Diagnosis not present

## 2017-12-03 DIAGNOSIS — H903 Sensorineural hearing loss, bilateral: Secondary | ICD-10-CM | POA: Diagnosis not present

## 2017-12-08 DIAGNOSIS — G894 Chronic pain syndrome: Secondary | ICD-10-CM | POA: Diagnosis not present

## 2017-12-15 DIAGNOSIS — K5901 Slow transit constipation: Secondary | ICD-10-CM | POA: Diagnosis not present

## 2017-12-15 DIAGNOSIS — M810 Age-related osteoporosis without current pathological fracture: Secondary | ICD-10-CM | POA: Diagnosis not present

## 2017-12-15 DIAGNOSIS — N3281 Overactive bladder: Secondary | ICD-10-CM | POA: Diagnosis not present

## 2017-12-15 DIAGNOSIS — K219 Gastro-esophageal reflux disease without esophagitis: Secondary | ICD-10-CM | POA: Diagnosis not present

## 2017-12-15 DIAGNOSIS — M1990 Unspecified osteoarthritis, unspecified site: Secondary | ICD-10-CM | POA: Diagnosis not present

## 2017-12-23 DIAGNOSIS — Z79899 Other long term (current) drug therapy: Secondary | ICD-10-CM | POA: Diagnosis not present

## 2018-01-05 DIAGNOSIS — G894 Chronic pain syndrome: Secondary | ICD-10-CM | POA: Diagnosis not present

## 2018-01-12 DIAGNOSIS — G9009 Other idiopathic peripheral autonomic neuropathy: Secondary | ICD-10-CM | POA: Diagnosis not present

## 2018-01-12 DIAGNOSIS — J3089 Other allergic rhinitis: Secondary | ICD-10-CM | POA: Diagnosis not present

## 2018-01-12 DIAGNOSIS — N3281 Overactive bladder: Secondary | ICD-10-CM | POA: Diagnosis not present

## 2018-01-12 DIAGNOSIS — K219 Gastro-esophageal reflux disease without esophagitis: Secondary | ICD-10-CM | POA: Diagnosis not present

## 2018-01-12 DIAGNOSIS — R197 Diarrhea, unspecified: Secondary | ICD-10-CM | POA: Diagnosis not present

## 2018-01-27 DIAGNOSIS — I739 Peripheral vascular disease, unspecified: Secondary | ICD-10-CM | POA: Diagnosis not present

## 2018-01-27 DIAGNOSIS — Q845 Enlarged and hypertrophic nails: Secondary | ICD-10-CM | POA: Diagnosis not present

## 2018-01-27 DIAGNOSIS — B351 Tinea unguium: Secondary | ICD-10-CM | POA: Diagnosis not present

## 2018-02-02 DIAGNOSIS — G894 Chronic pain syndrome: Secondary | ICD-10-CM | POA: Diagnosis not present

## 2018-02-16 DIAGNOSIS — R143 Flatulence: Secondary | ICD-10-CM | POA: Diagnosis not present

## 2018-02-16 DIAGNOSIS — J3089 Other allergic rhinitis: Secondary | ICD-10-CM | POA: Diagnosis not present

## 2018-02-16 DIAGNOSIS — K648 Other hemorrhoids: Secondary | ICD-10-CM | POA: Diagnosis not present

## 2018-02-16 DIAGNOSIS — R197 Diarrhea, unspecified: Secondary | ICD-10-CM | POA: Diagnosis not present

## 2018-02-16 DIAGNOSIS — M810 Age-related osteoporosis without current pathological fracture: Secondary | ICD-10-CM | POA: Diagnosis not present

## 2018-02-18 DIAGNOSIS — D2261 Melanocytic nevi of right upper limb, including shoulder: Secondary | ICD-10-CM | POA: Diagnosis not present

## 2018-02-18 DIAGNOSIS — I10 Essential (primary) hypertension: Secondary | ICD-10-CM | POA: Diagnosis not present

## 2018-02-18 DIAGNOSIS — D0462 Carcinoma in situ of skin of left upper limb, including shoulder: Secondary | ICD-10-CM | POA: Diagnosis not present

## 2018-02-18 DIAGNOSIS — E782 Mixed hyperlipidemia: Secondary | ICD-10-CM | POA: Diagnosis not present

## 2018-02-18 DIAGNOSIS — D225 Melanocytic nevi of trunk: Secondary | ICD-10-CM | POA: Diagnosis not present

## 2018-02-18 DIAGNOSIS — E119 Type 2 diabetes mellitus without complications: Secondary | ICD-10-CM | POA: Diagnosis not present

## 2018-02-18 DIAGNOSIS — L570 Actinic keratosis: Secondary | ICD-10-CM | POA: Diagnosis not present

## 2018-02-18 DIAGNOSIS — E039 Hypothyroidism, unspecified: Secondary | ICD-10-CM | POA: Diagnosis not present

## 2018-02-18 DIAGNOSIS — D2262 Melanocytic nevi of left upper limb, including shoulder: Secondary | ICD-10-CM | POA: Diagnosis not present

## 2018-02-18 DIAGNOSIS — L720 Epidermal cyst: Secondary | ICD-10-CM | POA: Diagnosis not present

## 2018-02-18 DIAGNOSIS — D2239 Melanocytic nevi of other parts of face: Secondary | ICD-10-CM | POA: Diagnosis not present

## 2018-02-26 DIAGNOSIS — K6282 Dysplasia of anus: Secondary | ICD-10-CM | POA: Diagnosis not present

## 2018-03-02 DIAGNOSIS — G894 Chronic pain syndrome: Secondary | ICD-10-CM | POA: Diagnosis not present

## 2018-03-05 DIAGNOSIS — Z79899 Other long term (current) drug therapy: Secondary | ICD-10-CM | POA: Diagnosis not present

## 2018-03-12 DIAGNOSIS — D508 Other iron deficiency anemias: Secondary | ICD-10-CM | POA: Diagnosis not present

## 2018-03-12 DIAGNOSIS — J3089 Other allergic rhinitis: Secondary | ICD-10-CM | POA: Diagnosis not present

## 2018-03-12 DIAGNOSIS — G9009 Other idiopathic peripheral autonomic neuropathy: Secondary | ICD-10-CM | POA: Diagnosis not present

## 2018-03-16 DIAGNOSIS — R143 Flatulence: Secondary | ICD-10-CM | POA: Diagnosis not present

## 2018-03-16 DIAGNOSIS — K5901 Slow transit constipation: Secondary | ICD-10-CM | POA: Diagnosis not present

## 2018-03-16 DIAGNOSIS — K219 Gastro-esophageal reflux disease without esophagitis: Secondary | ICD-10-CM | POA: Diagnosis not present

## 2018-03-16 DIAGNOSIS — K648 Other hemorrhoids: Secondary | ICD-10-CM | POA: Diagnosis not present

## 2018-03-16 DIAGNOSIS — N3281 Overactive bladder: Secondary | ICD-10-CM | POA: Diagnosis not present

## 2018-03-31 DIAGNOSIS — B351 Tinea unguium: Secondary | ICD-10-CM | POA: Diagnosis not present

## 2018-03-31 DIAGNOSIS — I739 Peripheral vascular disease, unspecified: Secondary | ICD-10-CM | POA: Diagnosis not present

## 2018-03-31 DIAGNOSIS — Q845 Enlarged and hypertrophic nails: Secondary | ICD-10-CM | POA: Diagnosis not present

## 2018-04-16 DIAGNOSIS — K648 Other hemorrhoids: Secondary | ICD-10-CM | POA: Diagnosis not present

## 2018-04-16 DIAGNOSIS — K5901 Slow transit constipation: Secondary | ICD-10-CM | POA: Diagnosis not present

## 2018-04-16 DIAGNOSIS — R143 Flatulence: Secondary | ICD-10-CM | POA: Diagnosis not present

## 2018-04-16 DIAGNOSIS — K219 Gastro-esophageal reflux disease without esophagitis: Secondary | ICD-10-CM | POA: Diagnosis not present

## 2018-04-16 DIAGNOSIS — N3281 Overactive bladder: Secondary | ICD-10-CM | POA: Diagnosis not present

## 2018-04-20 DIAGNOSIS — G894 Chronic pain syndrome: Secondary | ICD-10-CM | POA: Diagnosis not present

## 2018-04-23 DIAGNOSIS — Z79899 Other long term (current) drug therapy: Secondary | ICD-10-CM | POA: Diagnosis not present

## 2018-04-27 DIAGNOSIS — J3089 Other allergic rhinitis: Secondary | ICD-10-CM | POA: Diagnosis not present

## 2018-04-27 DIAGNOSIS — K5901 Slow transit constipation: Secondary | ICD-10-CM | POA: Diagnosis not present

## 2018-04-27 DIAGNOSIS — M810 Age-related osteoporosis without current pathological fracture: Secondary | ICD-10-CM | POA: Diagnosis not present

## 2018-04-27 DIAGNOSIS — N3281 Overactive bladder: Secondary | ICD-10-CM | POA: Diagnosis not present

## 2018-04-27 DIAGNOSIS — D508 Other iron deficiency anemias: Secondary | ICD-10-CM | POA: Diagnosis not present

## 2018-05-18 DIAGNOSIS — G894 Chronic pain syndrome: Secondary | ICD-10-CM | POA: Diagnosis not present

## 2018-05-25 DIAGNOSIS — D508 Other iron deficiency anemias: Secondary | ICD-10-CM | POA: Diagnosis not present

## 2018-05-25 DIAGNOSIS — G9009 Other idiopathic peripheral autonomic neuropathy: Secondary | ICD-10-CM | POA: Diagnosis not present

## 2018-05-25 DIAGNOSIS — R143 Flatulence: Secondary | ICD-10-CM | POA: Diagnosis not present

## 2018-05-25 DIAGNOSIS — M810 Age-related osteoporosis without current pathological fracture: Secondary | ICD-10-CM | POA: Diagnosis not present

## 2018-05-25 DIAGNOSIS — K648 Other hemorrhoids: Secondary | ICD-10-CM | POA: Diagnosis not present

## 2018-06-15 DIAGNOSIS — G894 Chronic pain syndrome: Secondary | ICD-10-CM | POA: Diagnosis not present

## 2018-06-22 DIAGNOSIS — R143 Flatulence: Secondary | ICD-10-CM | POA: Diagnosis not present

## 2018-06-22 DIAGNOSIS — K5901 Slow transit constipation: Secondary | ICD-10-CM | POA: Diagnosis not present

## 2018-06-22 DIAGNOSIS — G9009 Other idiopathic peripheral autonomic neuropathy: Secondary | ICD-10-CM | POA: Diagnosis not present

## 2018-06-22 DIAGNOSIS — N3281 Overactive bladder: Secondary | ICD-10-CM | POA: Diagnosis not present

## 2018-06-22 DIAGNOSIS — M810 Age-related osteoporosis without current pathological fracture: Secondary | ICD-10-CM | POA: Diagnosis not present

## 2018-06-29 DIAGNOSIS — K6282 Dysplasia of anus: Secondary | ICD-10-CM | POA: Diagnosis not present

## 2018-07-13 DIAGNOSIS — G894 Chronic pain syndrome: Secondary | ICD-10-CM | POA: Diagnosis not present

## 2018-07-20 DIAGNOSIS — R197 Diarrhea, unspecified: Secondary | ICD-10-CM | POA: Diagnosis not present

## 2018-07-20 DIAGNOSIS — M810 Age-related osteoporosis without current pathological fracture: Secondary | ICD-10-CM | POA: Diagnosis not present

## 2018-07-20 DIAGNOSIS — K5901 Slow transit constipation: Secondary | ICD-10-CM | POA: Diagnosis not present

## 2018-07-20 DIAGNOSIS — D508 Other iron deficiency anemias: Secondary | ICD-10-CM | POA: Diagnosis not present

## 2018-07-20 DIAGNOSIS — K219 Gastro-esophageal reflux disease without esophagitis: Secondary | ICD-10-CM | POA: Diagnosis not present

## 2018-07-29 DIAGNOSIS — K5901 Slow transit constipation: Secondary | ICD-10-CM | POA: Diagnosis not present

## 2018-07-29 DIAGNOSIS — R197 Diarrhea, unspecified: Secondary | ICD-10-CM | POA: Diagnosis not present

## 2018-07-29 DIAGNOSIS — M810 Age-related osteoporosis without current pathological fracture: Secondary | ICD-10-CM | POA: Diagnosis not present

## 2018-07-29 DIAGNOSIS — K219 Gastro-esophageal reflux disease without esophagitis: Secondary | ICD-10-CM | POA: Diagnosis not present

## 2018-07-29 DIAGNOSIS — D508 Other iron deficiency anemias: Secondary | ICD-10-CM | POA: Diagnosis not present

## 2018-08-10 DIAGNOSIS — G894 Chronic pain syndrome: Secondary | ICD-10-CM | POA: Diagnosis not present

## 2018-08-17 DIAGNOSIS — R197 Diarrhea, unspecified: Secondary | ICD-10-CM | POA: Diagnosis not present

## 2018-08-17 DIAGNOSIS — R143 Flatulence: Secondary | ICD-10-CM | POA: Diagnosis not present

## 2018-08-17 DIAGNOSIS — G9009 Other idiopathic peripheral autonomic neuropathy: Secondary | ICD-10-CM | POA: Diagnosis not present

## 2018-08-17 DIAGNOSIS — J3089 Other allergic rhinitis: Secondary | ICD-10-CM | POA: Diagnosis not present

## 2018-08-17 DIAGNOSIS — K219 Gastro-esophageal reflux disease without esophagitis: Secondary | ICD-10-CM | POA: Diagnosis not present

## 2018-09-01 ENCOUNTER — Ambulatory Visit (INDEPENDENT_AMBULATORY_CARE_PROVIDER_SITE_OTHER): Payer: Medicare Other | Admitting: Obstetrics & Gynecology

## 2018-09-01 ENCOUNTER — Encounter: Payer: Self-pay | Admitting: Obstetrics & Gynecology

## 2018-09-01 VITALS — BP 168/84 | Ht 59.0 in | Wt 141.0 lb

## 2018-09-01 DIAGNOSIS — Z01419 Encounter for gynecological examination (general) (routine) without abnormal findings: Secondary | ICD-10-CM

## 2018-09-01 DIAGNOSIS — Z780 Asymptomatic menopausal state: Secondary | ICD-10-CM

## 2018-09-01 DIAGNOSIS — K6282 Dysplasia of anus: Secondary | ICD-10-CM | POA: Diagnosis not present

## 2018-09-01 DIAGNOSIS — Z1151 Encounter for screening for human papillomavirus (HPV): Secondary | ICD-10-CM

## 2018-09-01 DIAGNOSIS — Z90710 Acquired absence of both cervix and uterus: Secondary | ICD-10-CM

## 2018-09-01 NOTE — Patient Instructions (Signed)
1. Encounter for Papanicolaou smear of vagina as part of routine gynecological examination Gynecologic exam status post total hysterectomy and menopause.  Given the recent history March 2019 of AIN 2, Pap test done at the vaginal vault.  No vaginal or vulvar lesions seen.  Breast exam normal.  Screening mammogram and health labs per family physician.  2. S/P total hysterectomy  3. Postmenopausal Well on no hormone replacement therapy.  Abstinent.  Osteoporosis on Risedronate.  Recommend vitamin D supplements, calcium intake of 1.2 g/day including nutritional and supplemental and weightbearing physical activities on a regular basis.  4. 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  Follow-up with gastroenterology.  Ashley Howard, it was a pleasure meeting you today!  I will inform you of your results as soon as they are available.

## 2018-09-01 NOTE — Progress Notes (Signed)
Ashley Howard [birth year] [id number]   History:    73 y.o. G2P2L1  Widowed.  Lives at [facility].  RP:  New patient presenting for annual gyn exam   HPI: S/P Total Hysterectomy.  Had a Colonoscopy 09/2017 with excision of polyps, one of which showed AIN 2 (see patho below).  For that reason, referred for Annual Gyn exam with a Pap test.  Per patient, no h/o abnormal Pap.  Abstinent x many yrs.  No pelvic pain.  Breasts normal.  Health labs with Fam MD.  Patho 09/2017:  1. Rectum, polyp(s), right anterior - ANORECTAL MUCOSA WITH NONSPECIFIC ARCHITECTURAL CHANGES, INFLAMMATION AND MUSCULARIZATION OF LAMINA PROPRIA, CONSISTENT WITH MUCOSAL PROLAPSE. - NEGATIVE FOR DYSPLASIA OR MALIGNANCY. 2. Rectum, polyp(s), right posterior - HIGH-GRADE SQUAMOUS INTRAEPITHELIAL LESION (HSIL; AIN-II). SEE NOTE. - REST OF ANORECTAL MUCOSA WITH NONSPECIFIC ARCHITECTURAL CHANGES, INFLAMMATION, AND MUSCULARIZATION OF LAMINA PROPRIA CONSISTENT WITH MUCOSAL PROLAPSE.  Past medical history,surgical history, family history and social history were all reviewed and documented in the EPIC chart.  Gynecologic History No LMP recorded. Patient has had a hysterectomy. Contraception: status post hysterectomy Last Pap: No recent pap per patient Last mammogram: No recent mammo per patient Bone Density: Osteoporosis on Risedronate Colonoscopy: 09/2017 AIN 2  Obstetric History OB History  Gravida Para Term Preterm AB Living  2 2       1   SAB TAB Ectopic Multiple Live Births               # Outcome Date GA Lbr Len/2nd Weight Sex Delivery Anes PTL Lv  2 Para           1 Para             Obstetric Comments  1 SON PASSED AWAY IN 1992     ROS: A ROS was performed and pertinent positives and negatives are included in the history.  GENERAL: No fevers or chills. HEENT: No change in vision, no earache, sore throat or sinus congestion. NECK: No pain or stiffness. CARDIOVASCULAR: No chest pain or pressure. No  palpitations. PULMONARY: No shortness of breath, cough or wheeze. GASTROINTESTINAL: No abdominal pain, nausea, vomiting or diarrhea, melena or bright red blood per rectum. GENITOURINARY: No urinary frequency, urgency, hesitancy or dysuria. MUSCULOSKELETAL: No joint or muscle pain, no back pain, no recent trauma. DERMATOLOGIC: No rash, no itching, no lesions. ENDOCRINE: No polyuria, polydipsia, no heat or cold intolerance. No recent change in weight. HEMATOLOGICAL: No anemia or easy bruising or bleeding. NEUROLOGIC: No headache, seizures, numbness, tingling or weakness. PSYCHIATRIC: No depression, no loss of interest in normal activity or change in sleep pattern.     Exam:   Ht 4\' 11"  (1.499 m)   Wt 141 lb (64 kg)   BMI 28.48 kg/m   Body mass index is 28.48 kg/m.  General appearance : Well developed well nourished female. No acute distress HEENT: Eyes: no retinal hemorrhage or exudates,  Neck supple, trachea midline, no carotid bruits, no thyroidmegaly Lungs: Clear to auscultation, no rhonchi or wheezes, or rib retractions  Heart: Regular rate and rhythm, no murmurs or gallops Breast:Examined in sitting and supine position were symmetrical in appearance, no palpable masses or tenderness,  no skin retraction, no nipple inversion, no nipple discharge, no skin discoloration, no axillary or supraclavicular lymphadenopathy Abdomen: no palpable masses or tenderness, no rebound or guarding Extremities: no edema or skin discoloration or tenderness  Pelvic: Vulva: Normal  Vagina: No gross lesions or discharge.  Pap/HPV HR done.  Cervix/Uterus absent  Adnexa  Without masses or tenderness  Perianal area normal.  Small amount of stools present.   Assessment/Plan:  73 y.o. female for annual exam   1. Encounter for Papanicolaou smear of vagina as part of routine gynecological examination Gynecologic exam status post total hysterectomy and menopause.  Given the recent history March 2019 of  AIN 2, Pap test done at the vaginal vault.  No vaginal or vulvar lesions seen.  Breast exam normal.  Screening mammogram and health labs per family physician.  2. S/P total hysterectomy  3. Postmenopausal Well on no hormone replacement therapy.  Abstinent.  Osteoporosis on Risedronate.  Recommend vitamin D supplements, calcium intake of 1.2 g/day including nutritional and supplemental and weightbearing physical activities on a regular basis.  4. 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  Follow-up with gastroenterology.  Princess Bruins MD, 10:24 AM 09/01/2018

## 2018-09-02 DIAGNOSIS — I10 Essential (primary) hypertension: Secondary | ICD-10-CM | POA: Diagnosis not present

## 2018-09-02 DIAGNOSIS — E039 Hypothyroidism, unspecified: Secondary | ICD-10-CM | POA: Diagnosis not present

## 2018-09-02 DIAGNOSIS — E782 Mixed hyperlipidemia: Secondary | ICD-10-CM | POA: Diagnosis not present

## 2018-09-02 DIAGNOSIS — E119 Type 2 diabetes mellitus without complications: Secondary | ICD-10-CM | POA: Diagnosis not present

## 2018-09-02 LAB — PAP, TP IMAGING W/ HPV RNA, RFLX HPV TYPE 16,18/45: HPV DNA High Risk: NOT DETECTED

## 2018-09-07 DIAGNOSIS — G894 Chronic pain syndrome: Secondary | ICD-10-CM | POA: Diagnosis not present

## 2018-09-09 ENCOUNTER — Encounter (INDEPENDENT_AMBULATORY_CARE_PROVIDER_SITE_OTHER): Payer: Self-pay | Admitting: Orthopaedic Surgery

## 2018-09-09 ENCOUNTER — Ambulatory Visit (INDEPENDENT_AMBULATORY_CARE_PROVIDER_SITE_OTHER): Payer: Medicare Other | Admitting: Orthopaedic Surgery

## 2018-09-09 ENCOUNTER — Ambulatory Visit (INDEPENDENT_AMBULATORY_CARE_PROVIDER_SITE_OTHER): Payer: Medicare Other

## 2018-09-09 VITALS — BP 158/85 | HR 72 | Ht 59.0 in | Wt 141.0 lb

## 2018-09-09 DIAGNOSIS — M5441 Lumbago with sciatica, right side: Secondary | ICD-10-CM | POA: Diagnosis not present

## 2018-09-09 NOTE — Progress Notes (Signed)
Office Visit Note   Patient: Ashley Howard           Date of Birth: [birth year]           MRN: [id number] Visit Date: 09/09/2018              Requested by: [name], MD [address] [place], [place] [address] PCP: Lynnell Catalan, FNP   Assessment & Plan: Visit Diagnoses:  1. Acute low back pain with right-sided sciatica, unspecified back pain laterality     Plan: we reviewed her xrays , She has no radiculopathy . She has no claudication at this time. Office followup prn.   Follow-Up Instructions: No follow-ups on file.   Orders:  Orders Placed This Encounter  Procedures  . XR Lumbar Spine 2-3 Views   No orders of the defined types were placed in this encounter.     Procedures: No procedures performed   Clinical Data: No additional findings.   Subjective: Chief Complaint  Patient presents with  . Lower Back - Pain    HPI 73 year old female amatory with a walker is here for evaluation of scoliosis.  She had persistent problems with low back pain that radiates into her right leg.  She has had some numbness and tingling she has pain with ambulation as well as sitting and standing.  She has used hydrocodone and meloxicam for pain.  Review of Systems positive history of septic shock protein calorie malnourishment, small bowel obstruction, hyperglycemia, GERD, gallstones, urinary incontinence, osteopenia with osteoporosis.,  Scoliosis.   Objective: Vital Signs: BP (!) 158/85   Pulse 72   Ht 4\' 11"  (1.499 m)   Wt 141 lb (64 kg)   BMI 28.48 kg/m   Physical Exam Constitutional:      Appearance: She is well-developed.  HENT:     Head: Normocephalic.     Right Ear: External ear normal.     Left Ear: External ear normal.  Eyes:     Pupils: Pupils are equal, round, and reactive to light.  Neck:     Thyroid: No thyromegaly.     Trachea: No tracheal deviation.  Cardiovascular:     Rate and Rhythm: Normal rate.  Pulmonary:     Effort:  Pulmonary effort is normal.  Abdominal:     Palpations: Abdomen is soft.  Musculoskeletal:        General: Swelling present.  Skin:    General: Skin is warm and dry.  Neurological:     Mental Status: She is alert and oriented to person, place, and time.  Psychiatric:        Behavior: Behavior normal.     Ortho Exam patient has intact knee and ankle jerk.  Anterior tib gastrocsoleus is strong.  Distal pulses are intact.  Normal hip range of motion knees reach full extension.  She is Dealer with a walker.pulses are intact. Some trochanteric tenderness.   Specialty Comments:  No specialty comments available.  Imaging: No results found.   PMFS History: Patient Active Problem List   Diagnosis Date Noted  . Anemia   . Rectal bleeding   . SBO (small bowel obstruction) ([place]) 08/21/2016  . Choledocholithiasis 08/21/2016  . Urinary incontinence 08/21/2016  . GERD (gastroesophageal reflux disease) 08/21/2016  . Neuropathic pain 08/21/2016  . Hyperglycemia 08/21/2016  . History of aspiration pneumonia 07/30/2016  . Septic shock ([place])   . Protein-calorie malnutrition, severe 07/20/2016  . Small bowel obstruction ([place]) 07/16/2016  .  Dehydration 07/16/2016  . Abnormality of gait 01/04/2013   Past Medical History:  Diagnosis Date  . Anemia   . Arthritis    "qwhere" (08/21/2016)  . Aspiration pneumonia ([place]) 07/2016  . Bowel incontinence   . Chronic back pain   . Chronic bronchitis ([place])   . Chronic low back pain   . Fluid retention in legs    wears compression stocking R leg  . Gait abnormality   . Gallstones   . GERD (gastroesophageal reflux disease)   . H/O hiatal hernia   . H/O small bowel obstruction 07/2016   admitted to [facility] 1/2 -07/24/16 with SBO which caused aspiration pneumonia and septic shock/notes 08/21/2016  . Hearing loss    mild  . Hemorrhoid   . History of blood transfusion 1975   "had a reaction"  . Incontinence of urine    wears adult briefs  . Pain,  dental   . PONV (postoperative nausea and vomiting)    "doesn't bother me so bad anymore" (08/21/2016)  . Poor historian   . Protein calorie malnutrition ([place])   . Scoliosis   . Scoliosis   . Seasonal allergies   . Shortness of breath    with pain  . Small bowel obstruction ([place]) 08/21/2016  . Status post partial amputation of foot, left ([place])   . Weight loss, unintentional    40 lbs in last year    Family History  Problem Relation Age of Onset  . Hypertension Mother   . Cancer Father   . Diabetes Neg Hx   . Stroke Neg Hx     Past Surgical History:  Procedure Laterality Date  . ABDOMINAL HYSTERECTOMY  07/1973   "still have one of my ovaries"  . AMPUTATION  11/07/2011   Procedure: AMPUTATION RAY;  Surgeon: Newt Minion, MD;  Location: [facility];  Service: Orthopedics;  Laterality: Left;  Left Foot 1st Ray Amputation  . APPENDECTOMY    . BLADDER REPAIR  ~ 02/1974   "hole in my bladder & a place that hadn't healed up from 07/1973 OR for hysterectomy"  . BREAST LUMPECTOMY Bilateral 07/1987   "benign"  . CATARACT EXTRACTION W/ INTRAOCULAR LENS  IMPLANT, BILATERAL Bilateral   . CHOLECYSTECTOMY N/A 08/26/2016   Procedure: LAPAROSCOPIC CHOLECYSTECTOMY;  Surgeon: Georganna Skeans, MD;  Location: [facility];  Service: General;  Laterality: N/A;  . COLONOSCOPY WITH PROPOFOL N/A 07/18/2017   Procedure: COLONOSCOPY WITH PROPOFOL;  Surgeon: Doran Stabler, MD;  Location: [facility];  Service: Gastroenterology;  Laterality: N/A;  . DOBUTAMINE STRESS ECHO    . ERCP N/A 08/24/2016   Procedure: ENDOSCOPIC RETROGRADE CHOLANGIOPANCREATOGRAPHY (ERCP);  Surgeon: Milus Banister, MD;  Location: [facility];  Service: Endoscopy;  Laterality: N/A;  . EXPLORATORY LAPAROTOMY  07/1973   "to straighten out my guts 7 days after hysterectomy"  . KNEE ARTHROSCOPY Left   . LAPAROSCOPIC LYSIS OF ADHESIONS N/A 08/26/2016   Procedure: LAPAROSCOPIC LYSIS OF ADHESIONS FOR 20 MINUTES;  Surgeon: Georganna Skeans, MD;   Location: [facility];  Service: General;  Laterality: N/A;  . LESION REMOVAL N/A 10/02/2017   Procedure: EXCISION OF ANAL POLYPOID LESION;  Surgeon: Ileana Roup, MD;  Location: [facility];  Service: General;  Laterality: N/A;  . RECTAL EXAM UNDER ANESTHESIA N/A 10/02/2017   Procedure: RECTAL EXAM UNDER ANESTHESIA;  Surgeon: Ileana Roup, MD;  Location: [facility];  Service: General;  Laterality: N/A;  . TONSILLECTOMY AND ADENOIDECTOMY     Social  History   Occupational History  . Occupation: retired  Tobacco Use  . Smoking status: Former Smoker    Packs/day: 1.00    Years: 54.00    Pack years: 54.00    Types: Cigarettes    Last attempt to quit: 07/15/2016    Years since quitting: 2.1  . Smokeless tobacco: Never Used  Substance and Sexual Activity  . Alcohol use: No    Comment: 08/21/2016 "nothing since ~ 04/2016"  . Drug use: No  . Sexual activity: Not Currently

## 2018-09-12 DIAGNOSIS — M810 Age-related osteoporosis without current pathological fracture: Secondary | ICD-10-CM | POA: Diagnosis not present

## 2018-09-12 DIAGNOSIS — D508 Other iron deficiency anemias: Secondary | ICD-10-CM | POA: Diagnosis not present

## 2018-09-21 DIAGNOSIS — R197 Diarrhea, unspecified: Secondary | ICD-10-CM | POA: Diagnosis not present

## 2018-09-21 DIAGNOSIS — J3089 Other allergic rhinitis: Secondary | ICD-10-CM | POA: Diagnosis not present

## 2018-09-21 DIAGNOSIS — R143 Flatulence: Secondary | ICD-10-CM | POA: Diagnosis not present

## 2018-09-21 DIAGNOSIS — G9009 Other idiopathic peripheral autonomic neuropathy: Secondary | ICD-10-CM | POA: Diagnosis not present

## 2018-09-21 DIAGNOSIS — K219 Gastro-esophageal reflux disease without esophagitis: Secondary | ICD-10-CM | POA: Diagnosis not present

## 2018-10-05 DIAGNOSIS — Z79899 Other long term (current) drug therapy: Secondary | ICD-10-CM | POA: Diagnosis not present

## 2018-10-05 DIAGNOSIS — G894 Chronic pain syndrome: Secondary | ICD-10-CM | POA: Diagnosis not present

## 2018-10-26 DIAGNOSIS — G9009 Other idiopathic peripheral autonomic neuropathy: Secondary | ICD-10-CM | POA: Diagnosis not present

## 2018-10-26 DIAGNOSIS — J3089 Other allergic rhinitis: Secondary | ICD-10-CM | POA: Diagnosis not present

## 2018-10-26 DIAGNOSIS — K648 Other hemorrhoids: Secondary | ICD-10-CM | POA: Diagnosis not present

## 2018-10-26 DIAGNOSIS — R197 Diarrhea, unspecified: Secondary | ICD-10-CM | POA: Diagnosis not present

## 2018-10-26 DIAGNOSIS — M1990 Unspecified osteoarthritis, unspecified site: Secondary | ICD-10-CM | POA: Diagnosis not present

## 2018-11-10 DIAGNOSIS — K648 Other hemorrhoids: Secondary | ICD-10-CM | POA: Diagnosis not present

## 2018-11-10 DIAGNOSIS — G9009 Other idiopathic peripheral autonomic neuropathy: Secondary | ICD-10-CM | POA: Diagnosis not present

## 2018-11-10 DIAGNOSIS — R197 Diarrhea, unspecified: Secondary | ICD-10-CM | POA: Diagnosis not present

## 2018-11-10 DIAGNOSIS — M1990 Unspecified osteoarthritis, unspecified site: Secondary | ICD-10-CM | POA: Diagnosis not present

## 2018-11-10 DIAGNOSIS — J3089 Other allergic rhinitis: Secondary | ICD-10-CM | POA: Diagnosis not present

## 2018-11-12 DIAGNOSIS — M810 Age-related osteoporosis without current pathological fracture: Secondary | ICD-10-CM | POA: Diagnosis not present

## 2018-11-12 DIAGNOSIS — D508 Other iron deficiency anemias: Secondary | ICD-10-CM | POA: Diagnosis not present

## 2018-11-16 DIAGNOSIS — G894 Chronic pain syndrome: Secondary | ICD-10-CM | POA: Diagnosis not present

## 2018-11-25 ENCOUNTER — Telehealth: Payer: Self-pay | Admitting: Orthopaedic Surgery

## 2018-11-25 NOTE — Telephone Encounter (Signed)
Patients daughter-in-law called and wanted to know what the outcome on x-rays showed. Patient didn't remember what really took place @ appt.  Please call Juliann Pulse [phone]

## 2018-11-25 NOTE — Telephone Encounter (Signed)
Please advise 

## 2018-11-26 NOTE — Telephone Encounter (Signed)
I called, discussed.  

## 2018-11-26 NOTE — Telephone Encounter (Signed)
noted 

## 2018-11-26 NOTE — Telephone Encounter (Signed)
Pt daughter in law Dallas Scorsone called in again wanting to know the results of the x-ray the pt had done back in February of 2020. Pt back pain happens to be getting worse as well.  [phone]

## 2018-11-26 NOTE — Telephone Encounter (Signed)
Please see below and advise.

## 2018-11-30 DIAGNOSIS — G9009 Other idiopathic peripheral autonomic neuropathy: Secondary | ICD-10-CM | POA: Diagnosis not present

## 2018-11-30 DIAGNOSIS — M810 Age-related osteoporosis without current pathological fracture: Secondary | ICD-10-CM | POA: Diagnosis not present

## 2018-11-30 DIAGNOSIS — M1990 Unspecified osteoarthritis, unspecified site: Secondary | ICD-10-CM | POA: Diagnosis not present

## 2018-11-30 DIAGNOSIS — K5901 Slow transit constipation: Secondary | ICD-10-CM | POA: Diagnosis not present

## 2018-11-30 DIAGNOSIS — K648 Other hemorrhoids: Secondary | ICD-10-CM | POA: Diagnosis not present

## 2018-12-14 DIAGNOSIS — G894 Chronic pain syndrome: Secondary | ICD-10-CM | POA: Diagnosis not present

## 2018-12-15 DIAGNOSIS — Z79899 Other long term (current) drug therapy: Secondary | ICD-10-CM | POA: Diagnosis not present

## 2018-12-15 DIAGNOSIS — N3281 Overactive bladder: Secondary | ICD-10-CM | POA: Diagnosis not present

## 2018-12-28 DIAGNOSIS — M1990 Unspecified osteoarthritis, unspecified site: Secondary | ICD-10-CM | POA: Diagnosis not present

## 2018-12-28 DIAGNOSIS — N3281 Overactive bladder: Secondary | ICD-10-CM | POA: Diagnosis not present

## 2018-12-28 DIAGNOSIS — G9009 Other idiopathic peripheral autonomic neuropathy: Secondary | ICD-10-CM | POA: Diagnosis not present

## 2018-12-28 DIAGNOSIS — M810 Age-related osteoporosis without current pathological fracture: Secondary | ICD-10-CM | POA: Diagnosis not present

## 2018-12-28 DIAGNOSIS — K648 Other hemorrhoids: Secondary | ICD-10-CM | POA: Diagnosis not present

## 2018-12-30 ENCOUNTER — Inpatient Hospital Stay (HOSPITAL_COMMUNITY)
Admission: EM | Admit: 2018-12-30 | Discharge: 2019-01-10 | DRG: 388 | Disposition: A | Discharge status: Home Health | Payer: Medicare Other | Attending: Internal Medicine | Admitting: Internal Medicine

## 2018-12-30 ENCOUNTER — Inpatient Hospital Stay (HOSPITAL_COMMUNITY): Payer: Medicare Other

## 2018-12-30 ENCOUNTER — Emergency Department (HOSPITAL_COMMUNITY): Payer: Medicare Other

## 2018-12-30 ENCOUNTER — Encounter (HOSPITAL_COMMUNITY): Payer: Self-pay | Admitting: Family Medicine

## 2018-12-30 ENCOUNTER — Other Ambulatory Visit: Payer: Self-pay

## 2018-12-30 DIAGNOSIS — Z1159 Encounter for screening for other viral diseases: Secondary | ICD-10-CM

## 2018-12-30 DIAGNOSIS — K449 Diaphragmatic hernia without obstruction or gangrene: Secondary | ICD-10-CM | POA: Diagnosis present

## 2018-12-30 DIAGNOSIS — R1111 Vomiting without nausea: Secondary | ICD-10-CM | POA: Diagnosis not present

## 2018-12-30 DIAGNOSIS — J690 Pneumonitis due to inhalation of food and vomit: Secondary | ICD-10-CM | POA: Diagnosis present

## 2018-12-30 DIAGNOSIS — E860 Dehydration: Secondary | ICD-10-CM | POA: Diagnosis not present

## 2018-12-30 DIAGNOSIS — H9190 Unspecified hearing loss, unspecified ear: Secondary | ICD-10-CM | POA: Diagnosis present

## 2018-12-30 DIAGNOSIS — G629 Polyneuropathy, unspecified: Secondary | ICD-10-CM | POA: Diagnosis present

## 2018-12-30 DIAGNOSIS — S22080S Wedge compression fracture of T11-T12 vertebra, sequela: Secondary | ICD-10-CM | POA: Diagnosis not present

## 2018-12-30 DIAGNOSIS — Z888 Allergy status to other drugs, medicaments and biological substances status: Secondary | ICD-10-CM | POA: Diagnosis not present

## 2018-12-30 DIAGNOSIS — M545 Low back pain: Secondary | ICD-10-CM | POA: Diagnosis present

## 2018-12-30 DIAGNOSIS — Z809 Family history of malignant neoplasm, unspecified: Secondary | ICD-10-CM | POA: Diagnosis not present

## 2018-12-30 DIAGNOSIS — R52 Pain, unspecified: Secondary | ICD-10-CM | POA: Diagnosis not present

## 2018-12-30 DIAGNOSIS — K56609 Unspecified intestinal obstruction, unspecified as to partial versus complete obstruction: Secondary | ICD-10-CM | POA: Diagnosis not present

## 2018-12-30 DIAGNOSIS — R110 Nausea: Secondary | ICD-10-CM | POA: Diagnosis not present

## 2018-12-30 DIAGNOSIS — E876 Hypokalemia: Secondary | ICD-10-CM | POA: Diagnosis not present

## 2018-12-30 DIAGNOSIS — Z87891 Personal history of nicotine dependence: Secondary | ICD-10-CM

## 2018-12-30 DIAGNOSIS — K56699 Other intestinal obstruction unspecified as to partial versus complete obstruction: Secondary | ICD-10-CM | POA: Diagnosis not present

## 2018-12-30 DIAGNOSIS — M4854XA Collapsed vertebra, not elsewhere classified, thoracic region, initial encounter for fracture: Secondary | ICD-10-CM | POA: Diagnosis not present

## 2018-12-30 DIAGNOSIS — R0902 Hypoxemia: Secondary | ICD-10-CM | POA: Diagnosis not present

## 2018-12-30 DIAGNOSIS — K5669 Other partial intestinal obstruction: Secondary | ICD-10-CM | POA: Diagnosis not present

## 2018-12-30 DIAGNOSIS — J189 Pneumonia, unspecified organism: Secondary | ICD-10-CM

## 2018-12-30 DIAGNOSIS — S22080A Wedge compression fracture of T11-T12 vertebra, initial encounter for closed fracture: Secondary | ICD-10-CM

## 2018-12-30 DIAGNOSIS — R195 Other fecal abnormalities: Secondary | ICD-10-CM | POA: Diagnosis not present

## 2018-12-30 DIAGNOSIS — R197 Diarrhea, unspecified: Secondary | ICD-10-CM

## 2018-12-30 DIAGNOSIS — K219 Gastro-esophageal reflux disease without esophagitis: Secondary | ICD-10-CM | POA: Diagnosis not present

## 2018-12-30 DIAGNOSIS — K6389 Other specified diseases of intestine: Secondary | ICD-10-CM | POA: Diagnosis not present

## 2018-12-30 DIAGNOSIS — R1110 Vomiting, unspecified: Secondary | ICD-10-CM | POA: Diagnosis not present

## 2018-12-30 DIAGNOSIS — D649 Anemia, unspecified: Secondary | ICD-10-CM | POA: Diagnosis present

## 2018-12-30 DIAGNOSIS — Z4682 Encounter for fitting and adjustment of non-vascular catheter: Secondary | ICD-10-CM | POA: Diagnosis not present

## 2018-12-30 DIAGNOSIS — G8929 Other chronic pain: Secondary | ICD-10-CM | POA: Diagnosis not present

## 2018-12-30 DIAGNOSIS — R0600 Dyspnea, unspecified: Secondary | ICD-10-CM

## 2018-12-30 DIAGNOSIS — I10 Essential (primary) hypertension: Secondary | ICD-10-CM | POA: Diagnosis not present

## 2018-12-30 DIAGNOSIS — Z791 Long term (current) use of non-steroidal anti-inflammatories (NSAID): Secondary | ICD-10-CM | POA: Diagnosis not present

## 2018-12-30 DIAGNOSIS — E871 Hypo-osmolality and hyponatremia: Secondary | ICD-10-CM

## 2018-12-30 DIAGNOSIS — Z8249 Family history of ischemic heart disease and other diseases of the circulatory system: Secondary | ICD-10-CM

## 2018-12-30 DIAGNOSIS — R000 Tachycardia, unspecified: Secondary | ICD-10-CM | POA: Diagnosis not present

## 2018-12-30 DIAGNOSIS — M4185 Other forms of scoliosis, thoracolumbar region: Secondary | ICD-10-CM | POA: Diagnosis not present

## 2018-12-30 DIAGNOSIS — Z0189 Encounter for other specified special examinations: Secondary | ICD-10-CM

## 2018-12-30 DIAGNOSIS — R112 Nausea with vomiting, unspecified: Secondary | ICD-10-CM | POA: Diagnosis present

## 2018-12-30 DIAGNOSIS — M47816 Spondylosis without myelopathy or radiculopathy, lumbar region: Secondary | ICD-10-CM | POA: Diagnosis not present

## 2018-12-30 DIAGNOSIS — J188 Other pneumonia, unspecified organism: Secondary | ICD-10-CM

## 2018-12-30 LAB — CBC WITH DIFFERENTIAL/PLATELET
Abs Immature Granulocytes: 0.03 10*3/uL (ref 0.00–0.07)
Basophils Absolute: 0 10*3/uL (ref 0.0–0.1)
Basophils Relative: 0 %
Eosinophils Absolute: 0 10*3/uL (ref 0.0–0.5)
Eosinophils Relative: 0 %
HCT: 40.3 % (ref 36.0–46.0)
Hemoglobin: 14.2 g/dL (ref 12.0–15.0)
Immature Granulocytes: 0 %
Lymphocytes Relative: 6 %
Lymphs Abs: 0.5 10*3/uL — ABNORMAL LOW (ref 0.7–4.0)
MCH: 33.2 pg (ref 26.0–34.0)
MCHC: 35.2 g/dL (ref 30.0–36.0)
MCV: 94.2 fL (ref 80.0–100.0)
Monocytes Absolute: 0.4 10*3/uL (ref 0.1–1.0)
Monocytes Relative: 4 %
Neutro Abs: 8.5 10*3/uL — ABNORMAL HIGH (ref 1.7–7.7)
Neutrophils Relative %: 90 %
Platelets: 284 10*3/uL (ref 150–400)
RBC: 4.28 MIL/uL (ref 3.87–5.11)
RDW: 12 % (ref 11.5–15.5)
WBC: 9.4 10*3/uL (ref 4.0–10.5)
nRBC: 0 % (ref 0.0–0.2)

## 2018-12-30 LAB — COMPREHENSIVE METABOLIC PANEL
ALT: 21 U/L (ref 0–44)
AST: 32 U/L (ref 15–41)
Albumin: 4.8 g/dL (ref 3.5–5.0)
Alkaline Phosphatase: 70 U/L (ref 38–126)
Anion gap: 14 (ref 5–15)
BUN: 18 mg/dL (ref 8–23)
CO2: 24 mmol/L (ref 22–32)
Calcium: 10.1 mg/dL (ref 8.9–10.3)
Chloride: 91 mmol/L — ABNORMAL LOW (ref 98–111)
Creatinine, Ser: 0.96 mg/dL (ref 0.44–1.00)
GFR calc Af Amer: >60 mL/min (ref >60)
GFR calc non Af Amer: 58 mL/min — ABNORMAL LOW (ref >60)
Glucose, Bld: 162 mg/dL — ABNORMAL HIGH (ref 70–99)
Potassium: 4.2 mmol/L (ref 3.5–5.1)
Sodium: 129 mmol/L — ABNORMAL LOW (ref 135–145)
Total Bilirubin: 0.7 mg/dL (ref 0.3–1.2)
Total Protein: 8.2 g/dL — ABNORMAL HIGH (ref 6.5–8.1)

## 2018-12-30 LAB — URINALYSIS, ROUTINE W REFLEX MICROSCOPIC
Bilirubin Urine: NEGATIVE
Glucose, UA: NEGATIVE mg/dL
Hgb urine dipstick: NEGATIVE
Ketones, ur: NEGATIVE mg/dL
Leukocytes,Ua: NEGATIVE
Nitrite: NEGATIVE
Protein, ur: NEGATIVE mg/dL
Specific Gravity, Urine: >1.046 — ABNORMAL HIGH (ref 1.005–1.030)
pH: 6 (ref 5.0–8.0)

## 2018-12-30 LAB — LACTIC ACID, PLASMA: Lactic Acid, Venous: 1.7 mmol/L (ref 0.5–1.9)

## 2018-12-30 LAB — SARS CORONAVIRUS 2 BY RT PCR (HOSPITAL ORDER, PERFORMED IN ~~LOC~~ HOSPITAL LAB): SARS Coronavirus 2: NEGATIVE

## 2018-12-30 LAB — LIPASE, BLOOD: Lipase: 37 U/L (ref 11–51)

## 2018-12-30 MED ORDER — VANCOMYCIN HCL 10 G IV SOLR
1250.0000 mg | INTRAVENOUS | Status: AC
  Administered 2018-12-30: 16:00:00 1250 mg via INTRAVENOUS
  Filled 2018-12-30: qty 1250

## 2018-12-30 MED ORDER — SODIUM CHLORIDE (PF) 0.9 % IJ SOLN
INTRAMUSCULAR | Status: AC
  Administered 2018-12-30: 12:00:00
  Filled 2018-12-30: qty 50

## 2018-12-30 MED ORDER — SODIUM CHLORIDE 0.9 % IV BOLUS
500.0000 mL | Freq: Once | INTRAVENOUS | Status: AC
  Administered 2018-12-30: 11:00:00 500 mL via INTRAVENOUS

## 2018-12-30 MED ORDER — DIATRIZOATE MEGLUMINE & SODIUM 66-10 % PO SOLN
90.0000 mL | Freq: Once | ORAL | Status: DC
  Filled 2018-12-30 (×2): qty 90

## 2018-12-30 MED ORDER — MORPHINE SULFATE (PF) 2 MG/ML IV SOLN
2.0000 mg | INTRAVENOUS | Status: DC | PRN
  Administered 2018-12-31 (×3): 2 mg via INTRAVENOUS
  Filled 2018-12-30 (×3): qty 1

## 2018-12-30 MED ORDER — VANCOMYCIN HCL IN DEXTROSE 750-5 MG/150ML-% IV SOLN: 750.0000 mg | INTRAVENOUS | Status: DC

## 2018-12-30 MED ORDER — ONDANSETRON HCL 4 MG/2ML IJ SOLN
4.0000 mg | Freq: Three times a day (TID) | INTRAMUSCULAR | Status: DC | PRN
  Administered 2018-12-31 – 2019-01-09 (×5): 4 mg via INTRAVENOUS
  Filled 2018-12-30 (×6): qty 2

## 2018-12-30 MED ORDER — MORPHINE SULFATE (PF) 4 MG/ML IV SOLN: 4.0000 mg | Freq: Once | INTRAVENOUS | Status: DC

## 2018-12-30 MED ORDER — SODIUM CHLORIDE 0.9 % IV SOLN
2.0000 g | Freq: Two times a day (BID) | INTRAVENOUS | Status: DC
  Administered 2018-12-31 – 2019-01-01 (×3): 2 g via INTRAVENOUS
  Filled 2018-12-30 (×4): qty 2

## 2018-12-30 MED ORDER — SODIUM CHLORIDE 0.9 % IV SOLN
1.0000 g | Freq: Once | INTRAVENOUS | Status: AC
  Administered 2018-12-30: 14:00:00 1 g via INTRAVENOUS
  Filled 2018-12-30: qty 1

## 2018-12-30 MED ORDER — ENOXAPARIN SODIUM 40 MG/0.4ML ~~LOC~~ SOLN
40.0000 mg | SUBCUTANEOUS | Status: DC
  Administered 2018-12-30 – 2019-01-09 (×11): 40 mg via SUBCUTANEOUS
  Filled 2018-12-30 (×11): qty 0.4

## 2018-12-30 MED ORDER — ONDANSETRON HCL 4 MG/2ML IJ SOLN: 4.0000 mg | Freq: Once | INTRAMUSCULAR | Status: DC

## 2018-12-30 MED ORDER — IOHEXOL 300 MG/ML  SOLN
100.0000 mL | Freq: Once | INTRAMUSCULAR | Status: AC | PRN
  Administered 2018-12-30: 100 mL via INTRAVENOUS

## 2018-12-30 MED ORDER — SODIUM CHLORIDE 0.9 % IV SOLN
INTRAVENOUS | Status: DC
  Administered 2018-12-30 – 2019-01-01 (×5): via INTRAVENOUS

## 2018-12-30 NOTE — Progress Notes (Signed)
Pharmacy Antibiotic Note  Ashley Howard is a 73 y.o. female admitted on 12/30/2018 with SBO- incidential finding of pneumonia on CT.  Pharmacy has been consulted for Vancomycin & Cefepime dosing.  12/30/2018:  Afebrile  WBC, LA WNL  Renal fxn at baseline, est CrCl ~29ml/min  Presents with GI s/sx c/w SBO, no complaints related to PNA.  Incidential finding on Abdominal CT.  Plan:  Cefepime 2gm IV q12h  Vancomycin 1250mg  IV x1 then Vancomycin 750mg  IV q24h (target AUC 400-550)  Check MRSA PCR and d/c Vancomycin if negative  Monitor renal function and cx data   Minimize LOT since patient asymptomatic  Height: 4\' 11"  (149.9 cm) Weight: (S) 143 lb (64.9 kg) IBW/kg (Calculated) : 43.2  Temp (24hrs), Avg:98.5 F (36.9 C), Min:98.5 F (36.9 C), Max:98.5 F (36.9 C)  Recent Labs  Lab 12/30/18 1022 12/30/18 1236  WBC 9.4  --   CREATININE 0.96  --   LATICACIDVEN  --  1.7    Estimated Creatinine Clearance: 42.1 mL/min (by C-G formula based on SCr of 0.96 mg/dL).    Allergies  Allergen Reactions  . Tape Other (See Comments)    Paper tape - blisters    Antimicrobials this admission: 6/17 Vanc >>  6/17 Cefepime >>   Dose adjustments this admission:  Microbiology results: 6/17 BCx:  6/17 SARSCoV2: negative MRSA PCR:   Thank you for allowing pharmacy to be a part of this patient's care.  Biagio Borg 12/30/2018 2:39 PM

## 2018-12-30 NOTE — ED Notes (Signed)
Report given toMary

## 2018-12-30 NOTE — ED Notes (Signed)
Pure wick has been placed. Suction has been set to 80mmHg.

## 2018-12-30 NOTE — H&P (Signed)
History and Physical    Ashley Howard DOB: [birth year] DOA: 12/30/2018  PCP: Lynnell Catalan, FNP   Patient coming from:ALF     Chief Complaint: Abdomen pain, nausea, vomiting  HPI: Ashley Howard is a 73 y.o. female with medical history significant for multiple abdominal surgeries, scoliosis, osteoporosis, compression fracture, previous SBO status post explorative laparotomy who presents from assisted  living facility the emergency department for the evaluation of nausea, vomiting, abdomen pain.  She was apparently all right till yesterday evening.  After eating her dinner she started having generalized abdominal pain that was worse in the epigastric region.  She describes the pain as cramping, waxing.  Her abdomen got progressively distended and she developed nausea and vomiting.  She reports multiple episodes of vomiting since then.  She reports that she had a small bowel movement in the assisted living facility.  Since then she has not passed any flatus or has a bowel movement.  Patient also reported of having some fullness in her chest. Patient seen and examined the bedside in the emergency department.  Currently she is hemodynamically stable.  Complains of some abdominal discomfort.  Denies any fever, chills, chest pain, cough, headache, dysuria.  ED Course: CT abdomen/pelvis done in the emergency department showed distal small bowel obstruction with transition point over the midline.  It also showed bilateral patchy infiltrates suggesting multifocal pneumonia.  COVID -19 came out  to be negative.  BMP showed mild hyponatremia.  Review of Systems: As per HPI otherwise 10 point review of systems negative.    Past Medical History:  Diagnosis Date  . Anemia   . Arthritis    "qwhere" (08/21/2016)  . Aspiration pneumonia ([place]) 07/2016  . Bowel incontinence   . Chronic back pain   . Chronic bronchitis ([place])   . Chronic low back pain   . Fluid retention in legs    wears  compression stocking R leg  . Gait abnormality   . Gallstones   . GERD (gastroesophageal reflux disease)   . H/O hiatal hernia   . H/O small bowel obstruction 07/2016   admitted to [facility] 1/2 -07/24/16 with SBO which caused aspiration pneumonia and septic shock/notes 08/21/2016  . Hearing loss    mild  . Hemorrhoid   . History of blood transfusion 1975   "had a reaction"  . Incontinence of urine    wears adult briefs  . Pain, dental   . PONV (postoperative nausea and vomiting)    "doesn't bother me so bad anymore" (08/21/2016)  . Poor historian   . Protein calorie malnutrition ([place])   . Scoliosis   . Scoliosis   . Seasonal allergies   . Shortness of breath    with pain  . Small bowel obstruction ([place]) 08/21/2016  . Status post partial amputation of foot, left ([place])   . Weight loss, unintentional    40 lbs in last year    Past Surgical History:  Procedure Laterality Date  . ABDOMINAL HYSTERECTOMY  07/1973   "still have one of my ovaries"  . AMPUTATION  11/07/2011   Procedure: AMPUTATION RAY;  Surgeon: Newt Minion, MD;  Location: [place];  Service: Orthopedics;  Laterality: Left;  Left Foot 1st Ray Amputation  . APPENDECTOMY    . BLADDER REPAIR  ~ 02/1974   "hole in my bladder & a place that hadn't healed up from 07/1973 OR for hysterectomy"  . BREAST LUMPECTOMY Bilateral 07/1987   "benign"  . CATARACT  EXTRACTION W/ INTRAOCULAR LENS  IMPLANT, BILATERAL Bilateral   . CHOLECYSTECTOMY N/A 08/26/2016   Procedure: LAPAROSCOPIC CHOLECYSTECTOMY;  Surgeon: Georganna Skeans, MD;  Location: [facility];  Service: General;  Laterality: N/A;  . COLONOSCOPY WITH PROPOFOL N/A 07/18/2017   Procedure: COLONOSCOPY WITH PROPOFOL;  Surgeon: Doran Stabler, MD;  Location: [facility];  Service: Gastroenterology;  Laterality: N/A;  . DOBUTAMINE STRESS ECHO    . ERCP N/A 08/24/2016   Procedure: ENDOSCOPIC RETROGRADE CHOLANGIOPANCREATOGRAPHY (ERCP);  Surgeon: Milus Banister, MD;  Location: [place];   Service: Endoscopy;  Laterality: N/A;  . EXPLORATORY LAPAROTOMY  07/1973   "to straighten out my guts 7 days after hysterectomy"  . KNEE ARTHROSCOPY Left   . LAPAROSCOPIC LYSIS OF ADHESIONS N/A 08/26/2016   Procedure: LAPAROSCOPIC LYSIS OF ADHESIONS FOR 20 MINUTES;  Surgeon: Georganna Skeans, MD;  Location: [place];  Service: General;  Laterality: N/A;  . LESION REMOVAL N/A 10/02/2017   Procedure: EXCISION OF ANAL POLYPOID LESION;  Surgeon: Ileana Roup, MD;  Location: [facility];  Service: General;  Laterality: N/A;  . RECTAL EXAM UNDER ANESTHESIA N/A 10/02/2017   Procedure: RECTAL EXAM UNDER ANESTHESIA;  Surgeon: Ileana Roup, MD;  Location: [facility];  Service: General;  Laterality: N/A;  . TONSILLECTOMY AND ADENOIDECTOMY       reports that she quit smoking about 2 years ago. Her smoking use included cigarettes. She has a 54.00 pack-year smoking history. She has never used smokeless tobacco. She reports that she does not drink alcohol or use drugs.  Allergies  Allergen Reactions  . Tape Other (See Comments)    Paper tape - blisters    Family History  Problem Relation Age of Onset  . Hypertension Mother   . Cancer Father   . Diabetes Neg Hx   . Stroke Neg Hx      Prior to Admission medications   Medication Sig Start Date End Date Taking? Authorizing Provider  acetaminophen (TYLENOL) 500 MG tablet Take 500 mg by mouth every 8 (eight) hours as needed.   Yes [provider]  alum & mag hydroxide-simeth (MAALOX/MYLANTA) 200-200-20 MG/5ML suspension Take 30 mLs by mouth every 6 (six) hours as needed for indigestion or heartburn.   Yes [provider]  Calcium Carb-Cholecalciferol (CALCIUM 500 + D3) 500-600 MG-UNIT TABS Take 1 tablet by mouth 2 (two) times daily. (0800 & 2000)   Yes [provider]  docusate sodium (COLACE) 100 MG capsule Take 100 mg by mouth daily. (0800)   Yes [provider]  gabapentin (NEURONTIN) 100 MG capsule Take 100 mg by  mouth at bedtime. (2000)   Yes [provider]  HYDROcodone-acetaminophen (NORCO) 5-325 MG tablet Take 2 tablets by mouth every 6 (six) hours as needed (pain not controlled with ibuprofen or tylenol). Patient taking differently: Take 1 tablet by mouth 2 (two) times a day.  10/02/17  Yes Ileana Roup, MD  loratadine (CLARITIN) 10 MG tablet Take 10 mg by mouth daily.   Yes [provider]  LORazepam (ATIVAN) 0.5 MG tablet Take 0.5 mg by mouth See admin instructions. Take 0.5 mg by mouth at bedtime. May also take twice daily as needed for anxiety.   Yes [provider]  meloxicam (MOBIC) 15 MG tablet Take 15 mg by mouth daily. (0800)   Yes [provider]  mirabegron ER (MYRBETRIQ) 25 MG TB24 tablet Take 25 mg by mouth daily. 0800   Yes [provider]  Multiple Vitamin (  MULTIVITAMIN WITH MINERALS) TABS tablet Take 1 tablet by mouth daily. (0800)   Yes [provider]  omeprazole (PRILOSEC) 20 MG capsule Take 20 mg by mouth daily. (0800)   Yes [provider]  Psyllium (KONSYL DAILY FIBER) 28.3 % POWD Take 10 mLs by mouth daily. Mix in 8 ounces of water   Yes [provider]  risedronate (ACTONEL) 35 MG tablet Take 35 mg by mouth every Wednesday. Mix with 8 oz of water. Remain upright and no food/drink for 30 min. Do not crush   Yes [provider]  simethicone (MYLICON) 706 MG chewable tablet Chew 125 mg by mouth 2 (two) times daily. (0800 & 1800)   Yes [provider]  triamcinolone ointment (KENALOG) 0.1 % Apply 1 application topically 2 (two) times a day. 11/16/18  Yes [provider]    Physical Exam: Vitals:   12/30/18 1245 12/30/18 1300 12/30/18 1321 12/30/18 1341  BP:  (!) 163/108  (!) 149/81  Pulse: (!) 45 93  87  Resp: 18 15  (!) 23  Temp:      TempSrc:      SpO2: 97% 99%  99%  Weight:      Height:   4\' 11"  (1.499 m)     Constitutional: Pleasant elderly female Vitals:    12/30/18 1245 12/30/18 1300 12/30/18 1321 12/30/18 1341  BP:  (!) 163/108  (!) 149/81  Pulse: (!) 45 93  87  Resp: 18 15  (!) 23  Temp:      TempSrc:      SpO2: 97% 99%  99%  Weight:      Height:   4\' 11"  (1.499 m)    Eyes: PERRL, lids and conjunctivae normal ENMT: Mucous membranes are moist. Posterior pharynx clear of any exudate or lesions.Normal dentition.  Neck: normal, supple, no masses, no thyromegaly Respiratory: clear to auscultation bilaterally, no wheezing, no crackles. Normal respiratory effort. No accessory muscle use.  Cardiovascular: Regular rate and rhythm, no murmurs / rubs / gallops. No extremity edema. 2+ pedal pulses. No carotid bruits.  Abdomen: Distended, mild generalized tenderness, no bowel sounds heard. Musculoskeletal: no clubbing / cyanosis. No joint deformity upper and lower extremities. Good ROM, no contractures. Normal muscle tone.  Skin: no rashes, lesions, ulcers. No induration Neurologic: CN 2-12 grossly intact. Sensation intact, DTR normal. Strength 5/5 in all 4.  Psychiatric: Normal judgment and insight. Alert and oriented x 3. Normal mood.   Foley Catheter:None  Labs on Admission: I have personally reviewed following labs and imaging studies  CBC: Recent Labs  Lab 12/30/18 1022  WBC 9.4  NEUTROABS 8.5*  HGB 14.2  HCT 40.3  MCV 94.2  PLT 237   Basic Metabolic Panel: Recent Labs  Lab 12/30/18 1022  NA 129*  K 4.2  CL 91*  CO2 24  GLUCOSE 162*  BUN 18  CREATININE 0.96  CALCIUM 10.1   GFR: Estimated Creatinine Clearance: 42.1 mL/min (by C-G formula based on SCr of 0.96 mg/dL). Liver Function Tests: Recent Labs  Lab 12/30/18 1022  AST 32  ALT 21  ALKPHOS 70  BILITOT 0.7  PROT 8.2*  ALBUMIN 4.8   Recent Labs  Lab 12/30/18 1022  LIPASE 37   No results for input(s): AMMONIA in the last 168 hours. Coagulation Profile: No results for input(s): INR, PROTIME in the last 168 hours. Cardiac Enzymes: No results for input(s):  CKTOTAL, CKMB, CKMBINDEX, TROPONINI in the last 168 hours. BNP (last 3 results) No  results for input(s): PROBNP in the last 8760 hours. HbA1C: No results for input(s): HGBA1C in the last 72 hours. CBG: No results for input(s): GLUCAP in the last 168 hours. Lipid Profile: No results for input(s): CHOL, HDL, LDLCALC, TRIG, CHOLHDL, LDLDIRECT in the last 72 hours. Thyroid Function Tests: No results for input(s): TSH, T4TOTAL, FREET4, T3FREE, THYROIDAB in the last 72 hours. Anemia Panel: No results for input(s): VITAMINB12, FOLATE, FERRITIN, TIBC, IRON, RETICCTPCT in the last 72 hours. Urine analysis:    Component Value Date/Time   COLORURINE YELLOW 09/07/2017 1043   APPEARANCEUR CLEAR 09/07/2017 1043   LABSPEC 1.008 09/07/2017 1043   PHURINE 7.0 09/07/2017 1043   GLUCOSEU NEGATIVE 09/07/2017 1043   HGBUR NEGATIVE 09/07/2017 1043   BILIRUBINUR NEGATIVE 09/07/2017 1043   KETONESUR NEGATIVE 09/07/2017 1043   PROTEINUR NEGATIVE 09/07/2017 1043   UROBILINOGEN 0.2 06/27/2016 1216   NITRITE NEGATIVE 09/07/2017 1043   LEUKOCYTESUR NEGATIVE 09/07/2017 1043    Radiological Exams on Admission: Ct Abdomen Pelvis W Contrast  Result Date: 12/30/2018 CLINICAL DATA:  Epigastric pain and constipation.  Vomiting. EXAM: CT ABDOMEN AND PELVIS WITH CONTRAST TECHNIQUE: Multidetector CT imaging of the abdomen and pelvis was performed using the standard protocol following bolus administration of intravenous contrast. CONTRAST:  147mL OMNIPAQUE IOHEXOL 300 MG/ML  SOLN COMPARISON:  08/21/2016 FINDINGS: Lower chest: Lung bases demonstrate patchy airspace process right worse than left likely due to multifocal pneumonia. No effusion. Fluid-filled dilated distal esophagus with fluid-filled moderate size hiatal hernia. Moderate narrowing of the stomach as it passes through the diaphragmatic hiatus. Hepatobiliary: Prior cholecystectomy. Liver is within normal. Biliary tree is unremarkable. Pancreas: Normal. Spleen:  Normal. Adrenals/Urinary Tract: Adrenal glands are normal. Kidneys are normal in size without focal mass or hydronephrosis. Subcentimeter right renal cortical hypodensity too small to characterize but likely a cyst. Ureters and bladder are normal. Stomach/Bowel: Moderate-sized hiatal hernia with narrowing of the hernia as passes through the diaphragmatic hiatus. Multiple fluid-filled dilated small bowel loops with the ileum measuring up to 5.4 cm in diameter. There is an abrupt transition point of the ileum in the midline of the mid to lower abdomen just below the level of the aortic bifurcation with associated whirling of the midline mesentery. Remainder of the distal ileum is decompressed. Small amount of air and stool throughout the colon which is otherwise mildly decompressed. Moderate diverticulosis of the descending and sigmoid colon. Appendix not visualized. Vascular/Lymphatic: Moderate calcified plaque over the abdominal aorta and iliac arteries. No adenopathy. Reproductive: Prior hysterectomy. Other: No free peritoneal air and no significant free fluid. Musculoskeletal: Degenerate changes spine with the level disc disease over the lumbar spine. Stable grade 1 anterolisthesis of L3 on L4 and L4 on L5. Mild stable compression deformities of L2 and L4. Moderate T12 compression fracture age indeterminate but new since the previous exam. IMPRESSION: 1. Distal small bowel obstruction with abrupt transition point over the midline mid to lower abdomen involving the ileum with associated whirling of the mesentery. Small bowel measures up to 5.4 cm in diameter. Obstruction likely due to adhesions. 2. Patchy airspace process over the lung bases right worse than left compatible with multifocal pneumonia. 3. Moderate size hiatal hernia containing fluid with fluid over the distal esophagus and marked narrowing of the hernia as it passes through the diaphragmatic hiatus. Possible degree of obstruction. 4. Moderate T12  compression fracture age indeterminate but new since the prior exam. Stable mild compression deformities of L2 and L4. 5. Colonic diverticulosis. Subcentimeter right renal  cortical hypodensity too small to characterize but likely a cyst. Other postsurgical changes as described. 6.  Aortic Atherosclerosis (ICD10-I70.0). Electronically Signed   By: Marin Olp M.D.   On: 12/30/2018 12:25     Assessment/Plan Principal Problem:   Small bowel obstruction (HCC) Active Problems:   SBO (small bowel obstruction) (HCC)   Hyponatremia   T12 compression fracture (HCC)   HCAP (healthcare-associated pneumonia)   SBO: Has history of multiple abdominal surgeries in the past.  She has history of SBO in the past status post exploratory laparotomy. Presented with abdominal pain, nausea and vomiting.  CT abdomen/pelvis showed SBO.  Most likely associated with adhesions.  General surgery already following.  NG tube will be inserted.  Follow-up SBO protocol. We will continue to monitor electrolytes.  Start on gentle IV fluids.Continue antiemetics, pain medications.  Suspected healthcare associated pneumonia/aspiration pneumonia: CT also showed patchy bilateral infiltrates on the bases suggesting multifocal pneumonia.  Most likely this is associated with aspiration pneumonia because she was continuously vomiting.  Continue broad-spectrum antibiotics for now.  Consider de-escalating the antibiotics soon.  Follow-up cultures.  Currently she is saturating fine on room air.  Hyponatremia: Mild.  Continue gentle normal saline  T12 compression fracture: CT also showed moderate T12 compression fracture age indeterminate .  Patient has history of compression fractures in the past and has chronic back pain.  Continue supportive care, pain management.  Physical therapy when appropriate.   Severity of Illness: The appropriate patient status for this patient is INPATIENT.  Has small bowel obstruction multifocal pneumonia.   Needs close monitoring with IV fluids, IV antibiotics.  Might need surgery if  SBO does not resolve  DVT prophylaxis: Lovenox Code Status: Full code Family Communication: None present at the bedside Consults called: Surgery     Shelly Coss MD [facility] Pager [phone]  If 7PM-7AM, please contact night-coverage [email or web address] Password [facility]  12/30/2018, 2:05 PM

## 2018-12-30 NOTE — ED Notes (Signed)
Patient transported to CT 

## 2018-12-30 NOTE — ED Provider Notes (Signed)
[facility] DEPT Provider Note   CSN: [id number] Arrival date & time: 12/30/18  9485    History   Chief Complaint Chief Complaint  Patient presents with  . Emesis    HPI Ashley Howard is a 73 y.o. female.     73yo female brought in by EMS from [facility] for abdominal pain with nausea and vomiting. Patient reports not feeling well last night as she went for dinner, ate chili and macaroni salad for dinner, then had worsening abdominal pain with onset of nausea and vomiting. Abdominal pain is located across upper abdominal area, aching in nature, constant, severity varies, has vomiting with worsening pain. Had 1 small bowel movement this morning, normal, non bloody. Emesis described as non bloody. States feels similar to prior SBO she had several years ago (08/2016 per records). Prior abdominal surgeries include cholecystectomy, hysterectomy, appendectomy, ex-lap, lysis of adhesions. Patient was given 4mg  Zofran by EMS, states this has helped slightly.      Past Medical History:  Diagnosis Date  . Anemia   . Arthritis    "qwhere" (08/21/2016)  . Aspiration pneumonia ([place]) 07/2016  . Bowel incontinence   . Chronic back pain   . Chronic bronchitis ([place])   . Chronic low back pain   . Fluid retention in legs    wears compression stocking R leg  . Gait abnormality   . Gallstones   . GERD (gastroesophageal reflux disease)   . H/O hiatal hernia   . H/O small bowel obstruction 07/2016   admitted to [facility] 1/2 -07/24/16 with SBO which caused aspiration pneumonia and septic shock/notes 08/21/2016  . Hearing loss    mild  . Hemorrhoid   . History of blood transfusion 1975   "had a reaction"  . Incontinence of urine    wears adult briefs  . Pain, dental   . PONV (postoperative nausea and vomiting)    "doesn't bother me so bad anymore" (08/21/2016)  . Poor historian   . Protein calorie malnutrition ([place])   . Scoliosis   . Scoliosis   . Seasonal  allergies   . Shortness of breath    with pain  . Small bowel obstruction ([place]) 08/21/2016  . Status post partial amputation of foot, left ([place])   . Weight loss, unintentional    40 lbs in last year    Patient Active Problem List   Diagnosis Date Noted  . Anemia   . Rectal bleeding   . SBO (small bowel obstruction) ([place]) 08/21/2016  . Choledocholithiasis 08/21/2016  . Urinary incontinence 08/21/2016  . GERD (gastroesophageal reflux disease) 08/21/2016  . Neuropathic pain 08/21/2016  . Hyperglycemia 08/21/2016  . History of aspiration pneumonia 07/30/2016  . Septic shock ([employer])   . Protein-calorie malnutrition, severe 07/20/2016  . Small bowel obstruction ([place]) 07/16/2016  . Dehydration 07/16/2016  . Abnormality of gait 01/04/2013    Past Surgical History:  Procedure Laterality Date  . ABDOMINAL HYSTERECTOMY  07/1973   "still have one of my ovaries"  . AMPUTATION  11/07/2011   Procedure: AMPUTATION RAY;  Surgeon: Newt Minion, MD;  Location: [facility];  Service: Orthopedics;  Laterality: Left;  Left Foot 1st Ray Amputation  . APPENDECTOMY    . BLADDER REPAIR  ~ 02/1974   "hole in my bladder & a place that hadn't healed up from 07/1973 OR for hysterectomy"  . BREAST LUMPECTOMY Bilateral 07/1987   "benign"  . CATARACT EXTRACTION W/ INTRAOCULAR LENS  IMPLANT,  BILATERAL Bilateral   . CHOLECYSTECTOMY N/A 08/26/2016   Procedure: LAPAROSCOPIC CHOLECYSTECTOMY;  Surgeon: Georganna Skeans, MD;  Location: [facility];  Service: General;  Laterality: N/A;  . COLONOSCOPY WITH PROPOFOL N/A 07/18/2017   Procedure: COLONOSCOPY WITH PROPOFOL;  Surgeon: Doran Stabler, MD;  Location: [facility];  Service: Gastroenterology;  Laterality: N/A;  . DOBUTAMINE STRESS ECHO    . ERCP N/A 08/24/2016   Procedure: ENDOSCOPIC RETROGRADE CHOLANGIOPANCREATOGRAPHY (ERCP);  Surgeon: Milus Banister, MD;  Location: [facility];  Service: Endoscopy;  Laterality: N/A;  . EXPLORATORY LAPAROTOMY  07/1973   "to straighten  out my guts 7 days after hysterectomy"  . KNEE ARTHROSCOPY Left   . LAPAROSCOPIC LYSIS OF ADHESIONS N/A 08/26/2016   Procedure: LAPAROSCOPIC LYSIS OF ADHESIONS FOR 20 MINUTES;  Surgeon: Georganna Skeans, MD;  Location: [facility];  Service: General;  Laterality: N/A;  . LESION REMOVAL N/A 10/02/2017   Procedure: EXCISION OF ANAL POLYPOID LESION;  Surgeon: Ileana Roup, MD;  Location: [facility];  Service: General;  Laterality: N/A;  . RECTAL EXAM UNDER ANESTHESIA N/A 10/02/2017   Procedure: RECTAL EXAM UNDER ANESTHESIA;  Surgeon: Ileana Roup, MD;  Location: [facility];  Service: General;  Laterality: N/A;  . TONSILLECTOMY AND ADENOIDECTOMY       OB History    Gravida  2   Para  2   Term      Preterm      AB      Living  1     SAB      TAB      Ectopic      Multiple      Live Births           Obstetric Comments  1 SON PASSED AWAY IN 1992         Home Medications    Prior to Admission medications   Medication Sig Start Date End Date Taking? Authorizing Provider  acetaminophen (TYLENOL) 500 MG tablet Take 500 mg by mouth every 8 (eight) hours as needed.   Yes [provider]  alum & mag hydroxide-simeth (MAALOX/MYLANTA) 200-200-20 MG/5ML suspension Take 30 mLs by mouth every 6 (six) hours as needed for indigestion or heartburn.   Yes [provider]  Calcium Carb-Cholecalciferol (CALCIUM 500 + D3) 500-600 MG-UNIT TABS Take 1 tablet by mouth 2 (two) times daily. (0800 & 2000)   Yes [provider]  docusate sodium (COLACE) 100 MG capsule Take 100 mg by mouth daily. (0800)   Yes [provider]  gabapentin (NEURONTIN) 100 MG capsule Take 100 mg by mouth at bedtime. (2000)   Yes [provider]  HYDROcodone-acetaminophen (NORCO) 5-325 MG tablet Take 2 tablets by mouth every 6 (six) hours as needed (pain not controlled with ibuprofen or tylenol). Patient taking differently: Take 1 tablet by mouth 2 (two) times a day.  10/02/17   Yes Ileana Roup, MD  loratadine (CLARITIN) 10 MG tablet Take 10 mg by mouth daily.   Yes [provider]  LORazepam (ATIVAN) 0.5 MG tablet Take 0.5 mg by mouth See admin instructions. Take 0.5 mg by mouth at bedtime. May also take twice daily as needed for anxiety.   Yes [provider]  meloxicam (MOBIC) 15 MG tablet Take 15 mg by mouth daily. (0800)   Yes [provider]  mirabegron ER (MYRBETRIQ) 25 MG TB24 tablet Take 25 mg by mouth daily. 0800   Yes [provider]  Multiple Vitamin (MULTIVITAMIN  WITH MINERALS) TABS tablet Take 1 tablet by mouth daily. (0800)   Yes [provider]  omeprazole (PRILOSEC) 20 MG capsule Take 20 mg by mouth daily. (0800)   Yes [provider]  Psyllium (KONSYL DAILY FIBER) 28.3 % POWD Take 10 mLs by mouth daily. Mix in 8 ounces of water   Yes [provider]  risedronate (ACTONEL) 35 MG tablet Take 35 mg by mouth every Wednesday. Mix with 8 oz of water. Remain upright and no food/drink for 30 min. Do not crush   Yes [provider]  simethicone (MYLICON) 474 MG chewable tablet Chew 125 mg by mouth 2 (two) times daily. (0800 & 1800)   Yes [provider]  triamcinolone ointment (KENALOG) 0.1 % Apply 1 application topically 2 (two) times a day. 11/16/18  Yes [provider]    Family History Family History  Problem Relation Age of Onset  . Hypertension Mother   . Cancer Father   . Diabetes Neg Hx   . Stroke Neg Hx     Social History Social History   Tobacco Use  . Smoking status: Former Smoker    Packs/day: 1.00    Years: 54.00    Pack years: 54.00    Types: Cigarettes    Quit date: 07/15/2016    Years since quitting: 2.4  . Smokeless tobacco: Never Used  Substance Use Topics  . Alcohol use: No    Comment: 08/21/2016 "nothing since ~ 04/2016"  . Drug use: No     Allergies   Tape   Review of Systems Review of Systems  Constitutional: Negative for  chills, diaphoresis and fever.  Respiratory: Negative for shortness of breath.   Cardiovascular: Negative for chest pain.  Gastrointestinal: Positive for abdominal pain, nausea and vomiting. Negative for abdominal distention, blood in stool, constipation and diarrhea.  Genitourinary: Negative for difficulty urinating, dysuria, frequency and urgency.  Skin: Negative for rash and wound.  Neurological: Negative for dizziness and weakness.  All other systems reviewed and are negative.    Physical Exam Updated Vital Signs BP (!) 149/81   Pulse 87   Temp 98.5 F (36.9 C) (Oral)   Resp (!) 23   Ht 4\' 11"  (1.499 m)   Wt (S) 64.9 kg   SpO2 99%   BMI 28.88 kg/m   Physical Exam Vitals signs and nursing note reviewed.  Constitutional:      General: She is not in acute distress.    Appearance: Normal appearance. She is not diaphoretic.  HENT:     Mouth/Throat:     Mouth: Mucous membranes are moist.  Cardiovascular:     Rate and Rhythm: Normal rate and regular rhythm.     Pulses: Normal pulses.     Heart sounds: Normal heart sounds.  Pulmonary:     Effort: Pulmonary effort is normal.     Breath sounds: Normal breath sounds.  Abdominal:     Palpations: Abdomen is soft.     Tenderness: There is abdominal tenderness in the right upper quadrant, epigastric area and left upper quadrant. There is no guarding.     Comments: Well healed midline surgical scar  Musculoskeletal:     Right lower leg: No edema.     Left lower leg: No edema.  Skin:    General: Skin is warm and dry.     Findings: No erythema or rash.  Neurological:     Mental Status: She is alert and oriented to person, place, and  time.  Psychiatric:        Mood and Affect: Mood normal.        Behavior: Behavior normal.      ED Treatments / Results  Labs (all labs ordered are listed, but only abnormal results are displayed) Labs Reviewed  CBC WITH DIFFERENTIAL/PLATELET - Abnormal; Notable for the following  components:      Result Value   Neutro Abs 8.5 (*)    Lymphs Abs 0.5 (*)    All other components within normal limits  COMPREHENSIVE METABOLIC PANEL - Abnormal; Notable for the following components:   Sodium 129 (*)    Chloride 91 (*)    Glucose, Bld 162 (*)    Total Protein 8.2 (*)    GFR calc non Af Amer 58 (*)    All other components within normal limits  SARS CORONAVIRUS 2 (HOSPITAL ORDER, [facility])  CULTURE, BLOOD (ROUTINE X 2)  CULTURE, BLOOD (ROUTINE X 2)  LIPASE, BLOOD  LACTIC ACID, PLASMA  URINALYSIS, ROUTINE W REFLEX MICROSCOPIC    EKG None  Radiology Ct Abdomen Pelvis W Contrast  Result Date: 12/30/2018 CLINICAL DATA:  Epigastric pain and constipation.  Vomiting. EXAM: CT ABDOMEN AND PELVIS WITH CONTRAST TECHNIQUE: Multidetector CT imaging of the abdomen and pelvis was performed using the standard protocol following bolus administration of intravenous contrast. CONTRAST:  19mL OMNIPAQUE IOHEXOL 300 MG/ML  SOLN COMPARISON:  08/21/2016 FINDINGS: Lower chest: Lung bases demonstrate patchy airspace process right worse than left likely due to multifocal pneumonia. No effusion. Fluid-filled dilated distal esophagus with fluid-filled moderate size hiatal hernia. Moderate narrowing of the stomach as it passes through the diaphragmatic hiatus. Hepatobiliary: Prior cholecystectomy. Liver is within normal. Biliary tree is unremarkable. Pancreas: Normal. Spleen: Normal. Adrenals/Urinary Tract: Adrenal glands are normal. Kidneys are normal in size without focal mass or hydronephrosis. Subcentimeter right renal cortical hypodensity too small to characterize but likely a cyst. Ureters and bladder are normal. Stomach/Bowel: Moderate-sized hiatal hernia with narrowing of the hernia as passes through the diaphragmatic hiatus. Multiple fluid-filled dilated small bowel loops with the ileum measuring up to 5.4 cm in diameter. There is an abrupt transition point of the  ileum in the midline of the mid to lower abdomen just below the level of the aortic bifurcation with associated whirling of the midline mesentery. Remainder of the distal ileum is decompressed. Small amount of air and stool throughout the colon which is otherwise mildly decompressed. Moderate diverticulosis of the descending and sigmoid colon. Appendix not visualized. Vascular/Lymphatic: Moderate calcified plaque over the abdominal aorta and iliac arteries. No adenopathy. Reproductive: Prior hysterectomy. Other: No free peritoneal air and no significant free fluid. Musculoskeletal: Degenerate changes spine with the level disc disease over the lumbar spine. Stable grade 1 anterolisthesis of L3 on L4 and L4 on L5. Mild stable compression deformities of L2 and L4. Moderate T12 compression fracture age indeterminate but new since the previous exam. IMPRESSION: 1. Distal small bowel obstruction with abrupt transition point over the midline mid to lower abdomen involving the ileum with associated whirling of the mesentery. Small bowel measures up to 5.4 cm in diameter. Obstruction likely due to adhesions. 2. Patchy airspace process over the lung bases right worse than left compatible with multifocal pneumonia. 3. Moderate size hiatal hernia containing fluid with fluid over the distal esophagus and marked narrowing of the hernia as it passes through the diaphragmatic hiatus. Possible degree of obstruction. 4. Moderate T12 compression fracture age indeterminate but  new since the prior exam. Stable mild compression deformities of L2 and L4. 5. Colonic diverticulosis. Subcentimeter right renal cortical hypodensity too small to characterize but likely a cyst. Other postsurgical changes as described. 6.  Aortic Atherosclerosis (ICD10-I70.0). Electronically Signed   By: Marin Olp M.D.   On: 12/30/2018 12:25    Procedures .Critical Care Performed by: Tacy Learn, PA-C Authorized by: Tacy Learn, PA-C    Critical care provider statement:    Critical care time (minutes):  30   Critical care was necessary to treat or prevent imminent or life-threatening deterioration of the following conditions:  Dehydration and metabolic crisis   Critical care was time spent personally by me on the following activities:  Discussions with consultants, evaluation of patient's response to treatment, examination of patient, ordering and performing treatments and interventions, ordering and review of laboratory studies, ordering and review of radiographic studies, pulse oximetry, re-evaluation of patient's condition, obtaining history from patient or surrogate and review of old charts   (including critical care time)  Medications Ordered in ED Medications  ceFEPIme (MAXIPIME) 1 g in sodium chloride 0.9 % 100 mL IVPB (1 g Intravenous New Bag/Given 12/30/18 1343)  vancomycin (VANCOCIN) 1,250 mg in sodium chloride 0.9 % 250 mL IVPB (has no administration in time range)  morphine 4 MG/ML injection 4 mg (has no administration in time range)  ondansetron (ZOFRAN) injection 4 mg (has no administration in time range)  sodium chloride 0.9 % bolus 500 mL (0 mLs Intravenous Stopped 12/30/18 1134)  iohexol (OMNIPAQUE) 300 MG/ML solution 100 mL (100 mLs Intravenous Contrast Given 12/30/18 1145)  sodium chloride (PF) 0.9 % injection (  Given by Other 12/30/18 1204)     Initial Impression / Assessment and Plan / ED Course  I have reviewed the triage vital signs and the nursing notes.  Pertinent labs & imaging results that were available during my care of the patient were reviewed by me and considered in my medical decision making (see chart for details).  Clinical Course as of Dec 29 1398  Wed Dec 30, 2018  1347 72yo female with complaint of abdominal pain with vomiting, vomiting controlled with Zofran from EMS. On exam, upper abdominal tenderness. Review of labs- CBC without significant changes, CMP hyponatremia with Na 129,  Cl 91, lactic acid 1.7, lipase WNL, UA pending. Vitals- afebrile, BP mildly elevated. CT with SBO, incidental multifocal PNA. Abx ordered for HCAP as patient is a nursing facility resident. Also consider possible COVID, results pending.  Case discussed with Dr. Thurnell Garbe, ER attending, agrees with plan for surgery consult with hospitalist admission. Case discussed with Marlowe Alt, PA-C, can hold on NG tube unless patient has return of vomiting, worsening abdominal pain/distention. Surgery will consult, hospitalist paged for admission.    [LM]  1610 Return call from surgery, requests NG tube after reviewing CT, order placed. Case discussed with hospitalist who will consult for admission. Results discussed with patient, agreeable with plan, requests medicine for pain, order for Morphine and Zofran given.   [LM]    Clinical Course User Index [LM] Tacy Learn, PA-C      Final Clinical Impressions(s) / ED Diagnoses   Final diagnoses:  Small bowel obstruction [facility])  Multifocal pneumonia  Hyponatremia    ED Discharge Orders    None       Nabil[place], [place], PA-C 12/30/18 [place], [place], DO 12/31/18 [phone]

## 2018-12-30 NOTE — ED Notes (Signed)
Transported to CT 

## 2018-12-30 NOTE — Progress Notes (Signed)
NG advanced 8cm.

## 2018-12-30 NOTE — ED Notes (Signed)
Pt had 10 emesis with EMS on the way here.  She had Zofran per pt with EMS. Currently denies nausea. Continues to have epigastric pain that is improved from before but feels like an ache. C/o constipation. States that she only has BMs twice a week per normal.

## 2018-12-30 NOTE — Consult Note (Signed)
[facility] Surgery Consult Note  Ashley Howard [birth year]  [id number].    Requesting MD: Suella Broad, PA-C Chief Complaint/Reason for Consult: SBO  HPI: Ashley Howard is 73 y.o. female with a history of prior surgical history of appendectomy, cholecystectomy, abdominal hysterectomy, exploratory laparotomy (following abdominal hysterectomy for what sounds like a SBO) and laparoscopic lysis of adhesions who presented from an assisted living facility to the ED for abdominal pain.  Patient reports that yesterday prior to dinner she started having generalized abdominal pain that was worse in the epigastrium.  She describes her pain as cramping, waxing and waning, with associated abdominal distention, nausea and vomiting.  She reports she has had approximately 10 episodes of emesis since onset of her symptoms with her last episode in the ambulance just prior to arrival in the ED.  She reports this feels similar to when she has had small bowel obstructions in the past (prior SBO 07/16/2016 medically managed).  She reports she has not passed any flatus since onset of her symptoms. She did have a very small BM this morning that was loose. She reports she takes metamucil daily for constipation and usually goes every 1-3 days. She did try to eat a small amount of her dinner last night but immediately threw this up shortly after. The patient also notes that she has been having some subjective fever and chills at home. No chest pain, sob, cough, diarrhea, or dysuria. She has not urinated since onset of symptoms. Notes her pain has improved with IV morphine and nausea has improved with IV zofran.   In the ED the patient was afebrile, RR 17-23, HR 81-95, mildly hypertensive with O2 sats >95% on room air. WBC 9,400. Sodium 129, potassium 4.2, Creatinine 0.96, lactic acid 1.7. Blood cultures x 2 negative. CTAP with distal small bowel obstruction with transition point over the midline mid to lower abdomen  involving the ileum with associated whirling of the mesentery. She was also noted to have patchy airspace process over the lung bases right worse than left compatible with multifocal pneumonia. EDP was concerned for COVID given CT scan findings, COVID test negative.   ROS: Review of Systems  Constitutional: Positive for chills and fever.  Respiratory: Negative for cough and shortness of breath.   Cardiovascular: Negative for chest pain and leg swelling.  Gastrointestinal: Positive for abdominal pain, constipation, nausea and vomiting. Negative for blood in stool, diarrhea and melena.  Genitourinary: Negative for dysuria.       Decreased urine output  All other systems reviewed and are negative. All systems reviewed and otherwise negative except for as above  Family History  Problem Relation Age of Onset  . Hypertension Mother   . Cancer Father   . Diabetes Neg Hx   . Stroke Neg Hx     Past Medical History:  Diagnosis Date  . Anemia   . Arthritis    "qwhere" (08/21/2016)  . Aspiration pneumonia ([place]) 07/2016  . Bowel incontinence   . Chronic back pain   . Chronic bronchitis ([place])   . Chronic low back pain   . Fluid retention in legs    wears compression stocking R leg  . Gait abnormality   . Gallstones   . GERD (gastroesophageal reflux disease)   . H/O hiatal hernia   . H/O small bowel obstruction 07/2016   admitted to [facility] 1/2 -07/24/16 with SBO which caused aspiration pneumonia and septic shock/notes 08/21/2016  . Hearing loss    mild  .  Hemorrhoid   . History of blood transfusion 1975   "had a reaction"  . Incontinence of urine    wears adult briefs  . Pain, dental   . PONV (postoperative nausea and vomiting)    "doesn't bother me so bad anymore" (08/21/2016)  . Poor historian   . Protein calorie malnutrition ([place])   . Scoliosis   . Scoliosis   . Seasonal allergies   . Shortness of breath    with pain  . Small bowel obstruction ([place]) 08/21/2016  . Status post  partial amputation of foot, left ([place])   . Weight loss, unintentional    40 lbs in last year    Past Surgical History:  Procedure Laterality Date  . ABDOMINAL HYSTERECTOMY  07/1973   "still have one of my ovaries"  . AMPUTATION  11/07/2011   Procedure: AMPUTATION RAY;  Surgeon: Newt Minion, MD;  Location: [place];  Service: Orthopedics;  Laterality: Left;  Left Foot 1st Ray Amputation  . APPENDECTOMY    . BLADDER REPAIR  ~ 02/1974   "hole in my bladder & a place that hadn't healed up from 07/1973 OR for hysterectomy"  . BREAST LUMPECTOMY Bilateral 07/1987   "benign"  . CATARACT EXTRACTION W/ INTRAOCULAR LENS  IMPLANT, BILATERAL Bilateral   . CHOLECYSTECTOMY N/A 08/26/2016   Procedure: LAPAROSCOPIC CHOLECYSTECTOMY;  Surgeon: Georganna Skeans, MD;  Location: [place];  Service: General;  Laterality: N/A;  . COLONOSCOPY WITH PROPOFOL N/A 07/18/2017   Procedure: COLONOSCOPY WITH PROPOFOL;  Surgeon: Doran Stabler, MD;  Location: [facility];  Service: Gastroenterology;  Laterality: N/A;  . DOBUTAMINE STRESS ECHO    . ERCP N/A 08/24/2016   Procedure: ENDOSCOPIC RETROGRADE CHOLANGIOPANCREATOGRAPHY (ERCP);  Surgeon: Milus Banister, MD;  Location: [place];  Service: Endoscopy;  Laterality: N/A;  . EXPLORATORY LAPAROTOMY  07/1973   "to straighten out my guts 7 days after hysterectomy"  . KNEE ARTHROSCOPY Left   . LAPAROSCOPIC LYSIS OF ADHESIONS N/A 08/26/2016   Procedure: LAPAROSCOPIC LYSIS OF ADHESIONS FOR 20 MINUTES;  Surgeon: Georganna Skeans, MD;  Location: [place];  Service: General;  Laterality: N/A;  . LESION REMOVAL N/A 10/02/2017   Procedure: EXCISION OF ANAL POLYPOID LESION;  Surgeon: Ileana Roup, MD;  Location: [facility];  Service: General;  Laterality: N/A;  . RECTAL EXAM UNDER ANESTHESIA N/A 10/02/2017   Procedure: RECTAL EXAM UNDER ANESTHESIA;  Surgeon: Ileana Roup, MD;  Location: [facility];  Service: General;  Laterality: N/A;  . TONSILLECTOMY AND ADENOIDECTOMY       Social History:  reports that she quit smoking about 2 years ago. Her smoking use included cigarettes. She has a 54.00 pack-year smoking history. She has never used smokeless tobacco. She reports that she does not drink alcohol or use drugs.  Allergies:  Allergies  Allergen Reactions  . Tape Other (See Comments)    Paper tape - blisters    (Not in a hospital admission)   Prior to Admission medications   Medication Sig Start Date End Date Taking? Authorizing Provider  acetaminophen (TYLENOL) 500 MG tablet Take 500 mg by mouth every 8 (eight) hours as needed.   Yes [provider]  alum & mag hydroxide-simeth (MAALOX/MYLANTA) 200-200-20 MG/5ML suspension Take 30 mLs by mouth every 6 (six) hours as needed for indigestion or heartburn.   Yes [provider]  Calcium Carb-Cholecalciferol (CALCIUM 500 + D3) 500-600 MG-UNIT TABS Take 1 tablet by mouth 2 (two) times daily. (0800 & 2000)  Yes [provider]  docusate sodium (COLACE) 100 MG capsule Take 100 mg by mouth daily. (0800)   Yes [provider]  gabapentin (NEURONTIN) 100 MG capsule Take 100 mg by mouth at bedtime. (2000)   Yes [provider]  HYDROcodone-acetaminophen (NORCO) 5-325 MG tablet Take 2 tablets by mouth every 6 (six) hours as needed (pain not controlled with ibuprofen or tylenol). Patient taking differently: Take 1 tablet by mouth 2 (two) times a day.  10/02/17  Yes Ileana Roup, MD  loratadine (CLARITIN) 10 MG tablet Take 10 mg by mouth daily.   Yes [provider]  LORazepam (ATIVAN) 0.5 MG tablet Take 0.5 mg by mouth See admin instructions. Take 0.5 mg by mouth at bedtime. May also take twice daily as needed for anxiety.   Yes [provider]  meloxicam (MOBIC) 15 MG tablet Take 15 mg by mouth daily. (0800)   Yes [provider]  mirabegron ER (MYRBETRIQ) 25 MG TB24 tablet Take 25 mg by mouth daily. 0800   Yes [provider]   Multiple Vitamin (MULTIVITAMIN WITH MINERALS) TABS tablet Take 1 tablet by mouth daily. (0800)   Yes [provider]  omeprazole (PRILOSEC) 20 MG capsule Take 20 mg by mouth daily. (0800)   Yes [provider]  Psyllium (KONSYL DAILY FIBER) 28.3 % POWD Take 10 mLs by mouth daily. Mix in 8 ounces of water   Yes [provider]  risedronate (ACTONEL) 35 MG tablet Take 35 mg by mouth every Wednesday. Mix with 8 oz of water. Remain upright and no food/drink for 30 min. Do not crush   Yes [provider]  simethicone (MYLICON) 536 MG chewable tablet Chew 125 mg by mouth 2 (two) times daily. (0800 & 1800)   Yes [provider]  triamcinolone ointment (KENALOG) 0.1 % Apply 1 application topically 2 (two) times a day. 11/16/18  Yes [provider]    Blood pressure (!) 149/81, pulse 87, temperature 98.5 F (36.9 C), temperature source Oral, resp. rate (!) 23, height 4\' 11"  (1.499 m), weight (S) 64.9 kg, SpO2 99 %. Physical Exam: General: Elderly white female lying in bed in NAD HEENT: head is normocephalic, atraumatic.  Sclera are noninjected.  Pupils equal and round.  Ears and nose without any masses or lesions.  Mouth is pink and moist. Dentition fair on bottom. Edentulous on top.  Heart: regular, rate, and rhythm.  No obvious murmurs, gallops, or rubs noted.  Palpable pedal pulses bilaterally Lungs: Rales bilateral bases, R>L. Respiratory effort nonlabored Abd: Soft, ND, generalized tenderness worst in the epigastrium without rebound, rigidity or guarding. No signs of peritonitis. Hypoactive bowel sounds. No masses, hernias, or organomegaly. Prior abdominal scars as noted below. MS: all 4 extremities are symmetrical with no cyanosis, clubbing, or edema. Skin: warm and dry with no masses, lesions, or rashes Psych: A&Ox3 with an appropriate affect. Neuro: cranial nerves grossly intact, extremity CSM intact bilaterally, normal speech     Results  for orders placed or performed during the hospital encounter of 12/30/18 (from the past 48 hour(s))  CBC with Differential     Status: Abnormal   Collection Time: 12/30/18 10:22 AM  Result Value Ref Range   WBC 9.4 4.0 - 10.5 K/uL   RBC 4.28 3.87 - 5.11 MIL/uL   Hemoglobin 14.2 12.0 - 15.0 g/dL   HCT 40.3 36.0 - 46.0 %   MCV 94.2 80.0 - 100.0 fL   MCH 33.2 26.0 -  34.0 pg   MCHC 35.2 30.0 - 36.0 g/dL   RDW 12.0 11.5 - 15.5 %   Platelets 284 150 - 400 K/uL   nRBC 0.0 0.0 - 0.2 %   Neutrophils Relative % 90 %   Neutro Abs 8.5 (H) 1.7 - 7.7 K/uL   Lymphocytes Relative 6 %   Lymphs Abs 0.5 (L) 0.7 - 4.0 K/uL   Monocytes Relative 4 %   Monocytes Absolute 0.4 0.1 - 1.0 K/uL   Eosinophils Relative 0 %   Eosinophils Absolute 0.0 0.0 - 0.5 K/uL   Basophils Relative 0 %   Basophils Absolute 0.0 0.0 - 0.1 K/uL   Immature Granulocytes 0 %   Abs Immature Granulocytes 0.03 0.00 - 0.07 K/uL    Comment: Performed at [facility], [place] [address] [place], [place] [address]  Comprehensive metabolic panel     Status: Abnormal   Collection Time: 12/30/18 10:22 AM  Result Value Ref Range   Sodium 129 (L) 135 - 145 mmol/L   Potassium 4.2 3.5 - 5.1 mmol/L   Chloride 91 (L) 98 - 111 mmol/L   CO2 24 22 - 32 mmol/L   Glucose, Bld 162 (H) 70 - 99 mg/dL   BUN 18 8 - 23 mg/dL   Creatinine, Ser 0.96 0.44 - 1.00 mg/dL   Calcium 10.1 8.9 - 10.3 mg/dL   Total Protein 8.2 (H) 6.5 - 8.1 g/dL   Albumin 4.8 3.5 - 5.0 g/dL   AST 32 15 - 41 U/L   ALT 21 0 - 44 U/L   Alkaline Phosphatase 70 38 - 126 U/L   Total Bilirubin 0.7 0.3 - 1.2 mg/dL   GFR calc non Af Amer 58 (L) >60 mL/min   GFR calc Af Amer >60 >60 mL/min   Anion gap 14 5 - 15    Comment: Performed at [facility], [place] [address], [place], [place] [address]  Lipase, blood     Status: None   Collection Time: 12/30/18 10:22 AM  Result Value Ref Range   Lipase 37 11 - 51 U/L    Comment: Performed at [facility], [place] [address] [place], [place] [address]  Lactic acid, plasma     Status: None   Collection Time: 12/30/18 12:36 PM  Result Value Ref Range   Lactic Acid, Venous 1.7 0.5 - 1.9 mmol/L    Comment: Performed at [facility], [place] [address] [place], [place] [address]  SARS Coronavirus 2 (CEPHEID- Performed in [place] hospital lab), Hosp Order     Status: None   Collection Time: 12/30/18  1:01 PM   Specimen: Nasopharyngeal Swab  Result Value Ref Range   SARS Coronavirus 2 NEGATIVE NEGATIVE    Comment: (NOTE) If result is NEGATIVE SARS-CoV-2 target nucleic acids are NOT DETECTED. The SARS-CoV-2 RNA is generally detectable in upper and lower  respiratory specimens during the acute phase of infection. The lowest  concentration of SARS-CoV-2 viral copies this assay can detect is 250  copies / mL. A negative result does not preclude SARS-CoV-2 infection  and should not be used as the sole basis for treatment or other  patient management decisions.  A negative result may occur with  improper specimen collection / handling, submission of specimen other  than nasopharyngeal swab, presence of viral mutation(s) within the  areas targeted by this assay, and inadequate number of viral copies  (<250 copies / mL). A negative result must  be combined with clinical  observations, patient history, and epidemiological information. If result is POSITIVE SARS-CoV-2 target nucleic acids are DETECTED. The SARS-CoV-2 RNA is generally detectable in upper and lower  respiratory specimens dur ing the acute phase of infection.  Positive  results are indicative of active infection with SARS-CoV-2.  Clinical  correlation with patient history and other diagnostic information is  necessary to determine patient infection status.  Positive results do  not rule out bacterial infection or co-infection with other viruses. If result is PRESUMPTIVE POSTIVE SARS-CoV-2 nucleic  acids MAY BE PRESENT.   A presumptive positive result was obtained on the submitted specimen  and confirmed on repeat testing.  While 2019 novel coronavirus  (SARS-CoV-2) nucleic acids may be present in the submitted sample  additional confirmatory testing may be necessary for epidemiological  and / or clinical management purposes  to differentiate between  SARS-CoV-2 and other Sarbecovirus currently known to infect humans.  If clinically indicated additional testing with an alternate test  methodology [phone]) is advised. The SARS-CoV-2 RNA is generally  detectable in upper and lower respiratory sp ecimens during the acute  phase of infection. The expected result is Negative. Fact Sheet for Patients:  StrictlyIdeas.no Fact Sheet for Healthcare Providers: BankingDealers.co.za This test is not yet approved or cleared by the [place] FDA and has been authorized for detection and/or diagnosis of SARS-CoV-2 by FDA under an Emergency Use Authorization (EUA).  This EUA will remain in effect (meaning this test can be used) for the duration of the COVID-19 declaration under Section 564(b)(1) of the Act, 21 U.S.C. section 360bbb-3(b)(1), unless the authorization is terminated or revoked sooner. Performed at [facility], [place] [address] [place], [place] [address]    Ct Abdomen Pelvis W Contrast  Result Date: 12/30/2018 CLINICAL DATA:  Epigastric pain and constipation.  Vomiting. EXAM: CT ABDOMEN AND PELVIS WITH CONTRAST TECHNIQUE: Multidetector CT imaging of the abdomen and pelvis was performed using the standard protocol following bolus administration of intravenous contrast. CONTRAST:  162mL OMNIPAQUE IOHEXOL 300 MG/ML  SOLN COMPARISON:  08/21/2016 FINDINGS: Lower chest: Lung bases demonstrate patchy airspace process right worse than left likely due to multifocal pneumonia. No effusion. Fluid-filled dilated distal esophagus  with fluid-filled moderate size hiatal hernia. Moderate narrowing of the stomach as it passes through the diaphragmatic hiatus. Hepatobiliary: Prior cholecystectomy. Liver is within normal. Biliary tree is unremarkable. Pancreas: Normal. Spleen: Normal. Adrenals/Urinary Tract: Adrenal glands are normal. Kidneys are normal in size without focal mass or hydronephrosis. Subcentimeter right renal cortical hypodensity too small to characterize but likely a cyst. Ureters and bladder are normal. Stomach/Bowel: Moderate-sized hiatal hernia with narrowing of the hernia as passes through the diaphragmatic hiatus. Multiple fluid-filled dilated small bowel loops with the ileum measuring up to 5.4 cm in diameter. There is an abrupt transition point of the ileum in the midline of the mid to lower abdomen just below the level of the aortic bifurcation with associated whirling of the midline mesentery. Remainder of the distal ileum is decompressed. Small amount of air and stool throughout the colon which is otherwise mildly decompressed. Moderate diverticulosis of the descending and sigmoid colon. Appendix not visualized. Vascular/Lymphatic: Moderate calcified plaque over the abdominal aorta and iliac arteries. No adenopathy. Reproductive: Prior hysterectomy. Other: No free peritoneal air and no significant free fluid. Musculoskeletal: Degenerate changes spine with the level disc disease over the lumbar spine. Stable grade 1 anterolisthesis of L3 on L4 and L4 on L5. Mild stable compression  deformities of L2 and L4. Moderate T12 compression fracture age indeterminate but new since the previous exam. IMPRESSION: 1. Distal small bowel obstruction with abrupt transition point over the midline mid to lower abdomen involving the ileum with associated whirling of the mesentery. Small bowel measures up to 5.4 cm in diameter. Obstruction likely due to adhesions. 2. Patchy airspace process over the lung bases right worse than left compatible  with multifocal pneumonia. 3. Moderate size hiatal hernia containing fluid with fluid over the distal esophagus and marked narrowing of the hernia as it passes through the diaphragmatic hiatus. Possible degree of obstruction. 4. Moderate T12 compression fracture age indeterminate but new since the prior exam. Stable mild compression deformities of L2 and L4. 5. Colonic diverticulosis. Subcentimeter right renal cortical hypodensity too small to characterize but likely a cyst. Other postsurgical changes as described. 6.  Aortic Atherosclerosis (ICD10-I70.0). Electronically Signed   By: Marin Olp M.D.   On: 12/30/2018 12:25      Assessment/Plan GERD Anemia Multifocal PNA on CT scan  SBO - Prior surgical history of appendectomy, cholecystectomy, abdominal hysterectomy, exploratory laparotomy (following abdominal hysterectomy for what sounds like a SBO) and laparoscopic lysis of adhesions - CTAP with distal small bowel obstruction with  transition point over the midline mid to lower abdomen involving the ileum with associated whirling of the mesentery. Do not suspect ischemia at this time. No peritonitis. VSS. Lactic acid wnl. WBC wnl.  - No indication for emergent surgery.  - Will tx with SBO protocol. ED nurse to place NG tube.  - Keep K >4.0 and Mg >2.0 for bowel function - Mobilize for bowel function  ID - Maxipime and Vanc >> VTE - SCDs, Lovenox  FEN - NPO, IVF, K 4.2   Jillyn Ledger, PA-C [facility] 12/30/2018, 2:28 PM Pager: [phone]

## 2018-12-30 NOTE — ED Triage Notes (Signed)
Patient is a resident from [facility] and transported via [employer]. After dinner last night around 19:00pm, she started vomiting. EMS witnessed vomiting once, brown liquid. Patient informed EMS that it could be a small bowel obstruction as previously. She had a small bowel movement this morning. ZOFRAN 4mg  IM was administered. She normally does not use oxygen. However, EMS applied oxygen via [place] due to low sats in the 90's.

## 2018-12-30 NOTE — Progress Notes (Signed)
A consult was received from an ED physician for Vancomycin per pharmacy dosing.  The patient's profile has been reviewed for ht/wt/allergies/indication/available labs.    A one time order has been placed for Vancomycin 1250mg  IV.  Further antibiotics/pharmacy consults should be ordered by admitting physician if indicated.                       Thank you, Luiz Ochoa 12/30/2018  12:45 PM

## 2018-12-30 NOTE — ED Notes (Signed)
Bed: HO12 Expected date:  Expected time:  Means of arrival:  Comments: EMS: Nausea, Vomiting

## 2018-12-31 ENCOUNTER — Inpatient Hospital Stay (HOSPITAL_COMMUNITY): Payer: Medicare Other

## 2018-12-31 LAB — CBC
HCT: 37 % (ref 36.0–46.0)
Hemoglobin: 12.3 g/dL (ref 12.0–15.0)
MCH: 32.1 pg (ref 26.0–34.0)
MCHC: 33.2 g/dL (ref 30.0–36.0)
MCV: 96.6 fL (ref 80.0–100.0)
Platelets: 236 10*3/uL (ref 150–400)
RBC: 3.83 MIL/uL — ABNORMAL LOW (ref 3.87–5.11)
RDW: 12.2 % (ref 11.5–15.5)
WBC: 9 10*3/uL (ref 4.0–10.5)
nRBC: 0 % (ref 0.0–0.2)

## 2018-12-31 LAB — BASIC METABOLIC PANEL
Anion gap: 9 (ref 5–15)
BUN: 17 mg/dL (ref 8–23)
CO2: 23 mmol/L (ref 22–32)
Calcium: 8.5 mg/dL — ABNORMAL LOW (ref 8.9–10.3)
Chloride: 99 mmol/L (ref 98–111)
Creatinine, Ser: 0.82 mg/dL (ref 0.44–1.00)
GFR calc Af Amer: >60 mL/min (ref >60)
GFR calc non Af Amer: >60 mL/min (ref >60)
Glucose, Bld: 129 mg/dL — ABNORMAL HIGH (ref 70–99)
Potassium: 4.2 mmol/L (ref 3.5–5.1)
Sodium: 131 mmol/L — ABNORMAL LOW (ref 135–145)

## 2018-12-31 LAB — MRSA PCR SCREENING: MRSA by PCR: NEGATIVE

## 2018-12-31 MED ORDER — IOHEXOL 300 MG/ML  SOLN
20.0000 mL | Freq: Once | INTRAMUSCULAR | Status: AC | PRN
  Administered 2018-12-31: 13:00:00 20 mL via ORAL

## 2018-12-31 MED ORDER — HYDRALAZINE HCL 20 MG/ML IJ SOLN
5.0000 mg | INTRAMUSCULAR | Status: DC | PRN
  Administered 2018-12-31 – 2019-01-03 (×5): 5 mg via INTRAVENOUS
  Filled 2018-12-31 (×6): qty 1

## 2018-12-31 MED ORDER — DIATRIZOATE MEGLUMINE & SODIUM 66-10 % PO SOLN
90.0000 mL | Freq: Once | ORAL | Status: AC
  Administered 2018-12-31: 14:00:00 90 mL via NASOGASTRIC
  Filled 2018-12-31: qty 90

## 2018-12-31 NOTE — Progress Notes (Signed)
PROGRESS NOTE    RICHELE STRAND  DHR:[id number] DOB: May 19, 1946 DOA: 12/30/2018 PCP: Lynnell Catalan, FNP     Brief Narrative:  Ashley Howard is a 73 y.o. female with medical history significant for multiple abdominal surgeries, scoliosis, osteoporosis, compression fracture, previous SBO status post explorative laparotomy who presents from assisted  living facility the emergency department for the evaluation of nausea, vomiting, abdominal pain.  She was apparently all right until yesterday evening.  After eating her dinner, she started having generalized abdominal pain that was worse in the epigastric region.  She describes the pain as cramping, waxing.  Her abdomen got progressively distended and she developed nausea and vomiting.  She reports multiple episodes of vomiting since then.  She reports that she had a small bowel movement in the assisted living facility.  Since then she has not passed any flatus or has a bowel movement.  Patient also reported of having some fullness in her chest. CT abdomen/pelvis done in the emergency department showed distal small bowel obstruction with transition point over the midline.  It also showed bilateral patchy infiltrates suggesting multifocal pneumonia.  COVID-19 came out  to be negative.   New events last 24 hours / Subjective: NG tube was unable to be placed successfully, continue to get coiled in her hiatal hernia.  NG tube to be replaced by fluoroscopy  Assessment & Plan:   Principal Problem:   Small bowel obstruction ([place]) Active Problems:   SBO (small bowel obstruction) (HCC)   Hyponatremia   T12 compression fracture (HCC)   HCAP (healthcare-associated pneumonia)   SBO -Appreciate general surgery following -NG tube to be replaced by fluoroscopy -Small bowel obstruction protocol, supportive care, IV fluid  Multifocal pneumonia -Could be aspiration in setting of multiple episodes of vomiting -Remains on room air without respiratory  distress -MRSA PCR negative.  Stop vancomycin.  Continue cefepime  T12 compression fracture -CT revealed moderate T12 compression fracture, age indeterminant.  Pain control  Hyponatremia -Improving.  Continue IV fluids   DVT prophylaxis: Lovenox Code Status: Full Family Communication: Discussed with son over the phone  Disposition Plan: Pending improvement in small bowel obstruction   Consultants:   General surgery  Procedures:   None  Antimicrobials:  Anti-infectives (From admission, onward)   Start     Dose/Rate Route Frequency Ordered Stop   12/31/18 1600  vancomycin (VANCOCIN) IVPB 750 mg/150 ml premix  Status:  Discontinued     750 mg 150 mL/hr over 60 Minutes Intravenous Every 24 hours 12/30/18 1457 12/31/18 1037   12/31/18 0200  ceFEPIme (MAXIPIME) 2 g in sodium chloride 0.9 % 100 mL IVPB     2 g 200 mL/hr over 30 Minutes Intravenous Every 12 hours 12/30/18 1457     12/30/18 1245  ceFEPIme (MAXIPIME) 1 g in sodium chloride 0.9 % 100 mL IVPB     1 g 200 mL/hr over 30 Minutes Intravenous  Once 12/30/18 1236 12/30/18 1424   12/30/18 1245  vancomycin (VANCOCIN) 1,250 mg in sodium chloride 0.9 % 250 mL IVPB     1,250 mg 166.7 mL/hr over 90 Minutes Intravenous STAT 12/30/18 1244 12/30/18 1710        Objective: Vitals:   12/30/18 1341 12/30/18 1519 12/30/18 2023 12/31/18 0510  BP: (!) 149/81 107/71 110/73 127/79  Pulse: 87 74 82 75  Resp: (!) 23 16 18 18   Temp:  99.8 F (37.7 C) 98.7 F (37.1 C) 98.5 F (36.9 C)  TempSrc:  Oral Oral  Oral  SpO2: 99% 91% (!) 89% 96%  Weight:      Height:        Intake/Output Summary (Last 24 hours) at 12/31/2018 1309 Last data filed at 12/31/2018 1000 Gross per 24 hour  Intake 1496.57 ml  Output 1250 ml  Net 246.57 ml   Filed Weights   12/30/18 1242  Weight: (S) 64.9 kg    Examination:  General exam: Appears calm and comfortable  Respiratory system: Clear to auscultation. Respiratory effort normal. Cardiovascular  system: S1 & S2 heard, RRR. No JVD, murmurs, rubs, gallops or clicks. No pedal edema. Gastrointestinal system: Abdomen is mildly distended, soft and nontender. Bowel sounds heard. Central nervous system: Alert and oriented. No focal neurological deficits. Extremities: Symmetric 5 x 5 power. Skin: No rashes, lesions or ulcers Psychiatry: Judgement and insight appear normal. Mood & affect appropriate.   Data Reviewed: I have personally reviewed following labs and imaging studies  CBC: Recent Labs  Lab 12/30/18 1022 12/31/18 0343  WBC 9.4 9.0  NEUTROABS 8.5*  --   HGB 14.2 12.3  HCT 40.3 37.0  MCV 94.2 96.6  PLT 284 443   Basic Metabolic Panel: Recent Labs  Lab 12/30/18 1022 12/31/18 0343  NA 129* 131*  K 4.2 4.2  CL 91* 99  CO2 24 23  GLUCOSE 162* 129*  BUN 18 17  CREATININE 0.96 0.82  CALCIUM 10.1 8.5*   GFR: Estimated Creatinine Clearance: 49.3 mL/min (by C-G formula based on SCr of 0.82 mg/dL). Liver Function Tests: Recent Labs  Lab 12/30/18 1022  AST 32  ALT 21  ALKPHOS 70  BILITOT 0.7  PROT 8.2*  ALBUMIN 4.8   Recent Labs  Lab 12/30/18 1022  LIPASE 37   No results for input(s): AMMONIA in the last 168 hours. Coagulation Profile: No results for input(s): INR, PROTIME in the last 168 hours. Cardiac Enzymes: No results for input(s): CKTOTAL, CKMB, CKMBINDEX, TROPONINI in the last 168 hours. BNP (last 3 results) No results for input(s): PROBNP in the last 8760 hours. HbA1C: No results for input(s): HGBA1C in the last 72 hours. CBG: No results for input(s): GLUCAP in the last 168 hours. Lipid Profile: No results for input(s): CHOL, HDL, LDLCALC, TRIG, CHOLHDL, LDLDIRECT in the last 72 hours. Thyroid Function Tests: No results for input(s): TSH, T4TOTAL, FREET4, T3FREE, THYROIDAB in the last 72 hours. Anemia Panel: No results for input(s): VITAMINB12, FOLATE, FERRITIN, TIBC, IRON, RETICCTPCT in the last 72 hours. Sepsis Labs: Recent Labs  Lab  12/30/18 1236  LATICACIDVEN 1.7    Recent Results (from the past 240 hour(s))  SARS Coronavirus 2 (CEPHEID- Performed in [facility] hospital lab), Hosp Order     Status: None   Collection Time: 12/30/18  1:01 PM   Specimen: Nasopharyngeal Swab  Result Value Ref Range Status   SARS Coronavirus 2 NEGATIVE NEGATIVE Final    Comment: (NOTE) If result is NEGATIVE SARS-CoV-2 target nucleic acids are NOT DETECTED. The SARS-CoV-2 RNA is generally detectable in upper and lower  respiratory specimens during the acute phase of infection. The lowest  concentration of SARS-CoV-2 viral copies this assay can detect is 250  copies / mL. A negative result does not preclude SARS-CoV-2 infection  and should not be used as the sole basis for treatment or other  patient management decisions.  A negative result may occur with  improper specimen collection / handling, submission of specimen other  than nasopharyngeal swab, presence of viral mutation(s) within the  areas  targeted by this assay, and inadequate number of viral copies  (<250 copies / mL). A negative result must be combined with clinical  observations, patient history, and epidemiological information. If result is POSITIVE SARS-CoV-2 target nucleic acids are DETECTED. The SARS-CoV-2 RNA is generally detectable in upper and lower  respiratory specimens dur ing the acute phase of infection.  Positive  results are indicative of active infection with SARS-CoV-2.  Clinical  correlation with patient history and other diagnostic information is  necessary to determine patient infection status.  Positive results do  not rule out bacterial infection or co-infection with other viruses. If result is PRESUMPTIVE POSTIVE SARS-CoV-2 nucleic acids MAY BE PRESENT.   A presumptive positive result was obtained on the submitted specimen  and confirmed on repeat testing.  While 2019 novel coronavirus  (SARS-CoV-2) nucleic acids may be present in the submitted  sample  additional confirmatory testing may be necessary for epidemiological  and / or clinical management purposes  to differentiate between  SARS-CoV-2 and other Sarbecovirus currently known to infect humans.  If clinically indicated additional testing with an alternate test  methodology [phone]) is advised. The SARS-CoV-2 RNA is generally  detectable in upper and lower respiratory sp ecimens during the acute  phase of infection. The expected result is Negative. Fact Sheet for Patients:  StrictlyIdeas.no Fact Sheet for Healthcare Providers: BankingDealers.co.za This test is not yet approved or cleared by the [place] FDA and has been authorized for detection and/or diagnosis of SARS-CoV-2 by FDA under an Emergency Use Authorization (EUA).  This EUA will remain in effect (meaning this test can be used) for the duration of the COVID-19 declaration under Section 564(b)(1) of the Act, 21 U.S.C. section 360bbb-3(b)(1), unless the authorization is terminated or revoked sooner. Performed at [facility], [place] [address], [place], [place] [address]   MRSA PCR Screening     Status: None   Collection Time: 12/31/18  8:45 AM   Specimen: Nasal Mucosa; Nasopharyngeal  Result Value Ref Range Status   MRSA by PCR NEGATIVE NEGATIVE Final    Comment:        The GeneXpert MRSA Assay (FDA approved for NASAL specimens only), is one component of a comprehensive MRSA colonization surveillance program. It is not intended to diagnose MRSA infection nor to guide or monitor treatment for MRSA infections. Performed at [facility], [place] [address] [place], [place] [address]        Radiology Studies: Ct Abdomen Pelvis W Contrast  Result Date: 12/30/2018 CLINICAL DATA:  Epigastric pain and constipation.  Vomiting. EXAM: CT ABDOMEN AND PELVIS WITH CONTRAST TECHNIQUE: Multidetector CT imaging of the abdomen and  pelvis was performed using the standard protocol following bolus administration of intravenous contrast. CONTRAST:  111mL OMNIPAQUE IOHEXOL 300 MG/ML  SOLN COMPARISON:  08/21/2016 FINDINGS: Lower chest: Lung bases demonstrate patchy airspace process right worse than left likely due to multifocal pneumonia. No effusion. Fluid-filled dilated distal esophagus with fluid-filled moderate size hiatal hernia. Moderate narrowing of the stomach as it passes through the diaphragmatic hiatus. Hepatobiliary: Prior cholecystectomy. Liver is within normal. Biliary tree is unremarkable. Pancreas: Normal. Spleen: Normal. Adrenals/Urinary Tract: Adrenal glands are normal. Kidneys are normal in size without focal mass or hydronephrosis. Subcentimeter right renal cortical hypodensity too small to characterize but likely a cyst. Ureters and bladder are normal. Stomach/Bowel: Moderate-sized hiatal hernia with narrowing of the hernia as passes through the diaphragmatic hiatus. Multiple fluid-filled dilated small bowel loops with the ileum measuring  up to 5.4 cm in diameter. There is an abrupt transition point of the ileum in the midline of the mid to lower abdomen just below the level of the aortic bifurcation with associated whirling of the midline mesentery. Remainder of the distal ileum is decompressed. Small amount of air and stool throughout the colon which is otherwise mildly decompressed. Moderate diverticulosis of the descending and sigmoid colon. Appendix not visualized. Vascular/Lymphatic: Moderate calcified plaque over the abdominal aorta and iliac arteries. No adenopathy. Reproductive: Prior hysterectomy. Other: No free peritoneal air and no significant free fluid. Musculoskeletal: Degenerate changes spine with the level disc disease over the lumbar spine. Stable grade 1 anterolisthesis of L3 on L4 and L4 on L5. Mild stable compression deformities of L2 and L4. Moderate T12 compression fracture age indeterminate but new since  the previous exam. IMPRESSION: 1. Distal small bowel obstruction with abrupt transition point over the midline mid to lower abdomen involving the ileum with associated whirling of the mesentery. Small bowel measures up to 5.4 cm in diameter. Obstruction likely due to adhesions. 2. Patchy airspace process over the lung bases right worse than left compatible with multifocal pneumonia. 3. Moderate size hiatal hernia containing fluid with fluid over the distal esophagus and marked narrowing of the hernia as it passes through the diaphragmatic hiatus. Possible degree of obstruction. 4. Moderate T12 compression fracture age indeterminate but new since the prior exam. Stable mild compression deformities of L2 and L4. 5. Colonic diverticulosis. Subcentimeter right renal cortical hypodensity too small to characterize but likely a cyst. Other postsurgical changes as described. 6.  Aortic Atherosclerosis (ICD10-I70.0). Electronically Signed   By: Marin Olp M.D.   On: 12/30/2018 12:25   Dg Abd Portable 1v  Result Date: 12/30/2018 CLINICAL DATA:  NG tube repositioned EXAM: PORTABLE ABDOMEN - 1 VIEW COMPARISON:  None. FINDINGS: NG tube again noted coiling in the lower chest, likely within the hiatal hernia. IMPRESSION: NG tube coils in the lower chest, likely in the hiatal hernia. Electronically Signed   By: Rolm Baptise M.D.   On: 12/30/2018 20:02   Dg Abd Portable 1v-small Bowel Protocol-position Verification  Result Date: 12/30/2018 CLINICAL DATA:  Nasogastric tube placement EXAM: PORTABLE ABDOMEN - 1 VIEW COMPARISON:  Portable exam 1630 hours compared to earlier CT of 12/30/2018 FINDINGS: Tip of nasogastric tube projects over stomach though the proximal side-port projects over the distal esophagus, recommend advancing tube 7-8 cm. Gaseous distention of bowel loops in the mid abdomen and pelvis. No definite bowel wall thickening. Excreted contrast material within renal collecting systems and bladder. Bones  demineralized with dextroconvex thoracolumbar scoliosis and scattered degenerative changes. IMPRESSION: Recommend advancing nasogastric tube 7-8 cm. Electronically Signed   By: Lavonia Dana M.D.   On: 12/30/2018 17:05      Scheduled Meds: . diatrizoate meglumine-sodium  90 mL Per NG tube Once  . enoxaparin (LOVENOX) injection  40 mg Subcutaneous Q24H   Continuous Infusions: . sodium chloride 100 mL/hr at 12/31/18 1207  . ceFEPime (MAXIPIME) IV 2 g (12/31/18 0224)     LOS: 1 day    Time spent: 40 minutes   Dessa Phi, DO [facility] [email or web address] 12/31/2018, 1:09 PM

## 2018-12-31 NOTE — Progress Notes (Signed)
Barth Kirks Maczis, PA-C paged regarding NG tube being coiled in chest per XRay. He is contacting IR. Donne Hazel, RN

## 2018-12-31 NOTE — Progress Notes (Signed)
Call to radiology and informed Rollene Fare that patient received Gastrografin at 1347. Donne Hazel, RN

## 2018-12-31 NOTE — Progress Notes (Signed)
Small bowel obstruction (HCC)  Subjective: NG coiled in hiatal hernia  Objective: Vital signs in last 24 hours: Temp:  [98.5 F (36.9 C)-99.8 F (37.7 C)] 98.5 F (36.9 C) (06/18 0510) Pulse Rate:  [45-93] 75 (06/18 0510) Resp:  [15-23] 18 (06/18 0510) BP: (107-163)/(71-108) 127/79 (06/18 0510) SpO2:  [89 %-99 %] 96 % (06/18 0510) Weight:  [64.9 kg] 64.9 kg (06/17 1242) Last BM Date: 12/29/18  Intake/Output from previous day: 06/17 0701 - 06/18 0700 In: 1996.6 [I.V.:1146.6; IV Piggyback:850] Out: 900 [Urine:900] Intake/Output this shift: No intake/output data recorded.  General appearance: alert and cooperative GI: normal findings: soft, distended, nontender  Lab Results:  Results for orders placed or performed during the hospital encounter of 12/30/18 (from the past 24 hour(s))  CBC with Differential     Status: Abnormal   Collection Time: 12/30/18 10:22 AM  Result Value Ref Range   WBC 9.4 4.0 - 10.5 K/uL   RBC 4.28 3.87 - 5.11 MIL/uL   Hemoglobin 14.2 12.0 - 15.0 g/dL   HCT 40.3 36.0 - 46.0 %   MCV 94.2 80.0 - 100.0 fL   MCH 33.2 26.0 - 34.0 pg   MCHC 35.2 30.0 - 36.0 g/dL   RDW 12.0 11.5 - 15.5 %   Platelets 284 150 - 400 K/uL   nRBC 0.0 0.0 - 0.2 %   Neutrophils Relative % 90 %   Neutro Abs 8.5 (H) 1.7 - 7.7 K/uL   Lymphocytes Relative 6 %   Lymphs Abs 0.5 (L) 0.7 - 4.0 K/uL   Monocytes Relative 4 %   Monocytes Absolute 0.4 0.1 - 1.0 K/uL   Eosinophils Relative 0 %   Eosinophils Absolute 0.0 0.0 - 0.5 K/uL   Basophils Relative 0 %   Basophils Absolute 0.0 0.0 - 0.1 K/uL   Immature Granulocytes 0 %   Abs Immature Granulocytes 0.03 0.00 - 0.07 K/uL  Comprehensive metabolic panel     Status: Abnormal   Collection Time: 12/30/18 10:22 AM  Result Value Ref Range   Sodium 129 (L) 135 - 145 mmol/L   Potassium 4.2 3.5 - 5.1 mmol/L   Chloride 91 (L) 98 - 111 mmol/L   CO2 24 22 - 32 mmol/L   Glucose, Bld 162 (H) 70 - 99 mg/dL   BUN 18 8 - 23 mg/dL   Creatinine, Ser 0.96 0.44 - 1.00 mg/dL   Calcium 10.1 8.9 - 10.3 mg/dL   Total Protein 8.2 (H) 6.5 - 8.1 g/dL   Albumin 4.8 3.5 - 5.0 g/dL   AST 32 15 - 41 U/L   ALT 21 0 - 44 U/L   Alkaline Phosphatase 70 38 - 126 U/L   Total Bilirubin 0.7 0.3 - 1.2 mg/dL   GFR calc non Af Amer 58 (L) >60 mL/min   GFR calc Af Amer >60 >60 mL/min   Anion gap 14 5 - 15  Lipase, blood     Status: None   Collection Time: 12/30/18 10:22 AM  Result Value Ref Range   Lipase 37 11 - 51 U/L  Lactic acid, plasma     Status: None   Collection Time: 12/30/18 12:36 PM  Result Value Ref Range   Lactic Acid, Venous 1.7 0.5 - 1.9 mmol/L  SARS Coronavirus 2 (CEPHEID- Performed in [place] hospital lab), Hosp Order     Status: None   Collection Time: 12/30/18  1:01 PM   Specimen: Nasopharyngeal Swab  Result Value Ref Range   SARS Coronavirus  2 NEGATIVE NEGATIVE  Urinalysis, Routine w reflex microscopic     Status: Abnormal   Collection Time: 12/30/18  2:46 PM  Result Value Ref Range   Color, Urine YELLOW YELLOW   APPearance CLEAR CLEAR   Specific Gravity, Urine >1.046 (H) 1.005 - 1.030   pH 6.0 5.0 - 8.0   Glucose, UA NEGATIVE NEGATIVE mg/dL   Hgb urine dipstick NEGATIVE NEGATIVE   Bilirubin Urine NEGATIVE NEGATIVE   Ketones, ur NEGATIVE NEGATIVE mg/dL   Protein, ur NEGATIVE NEGATIVE mg/dL   Nitrite NEGATIVE NEGATIVE   Leukocytes,Ua NEGATIVE NEGATIVE  Basic metabolic panel     Status: Abnormal   Collection Time: 12/31/18  3:43 AM  Result Value Ref Range   Sodium 131 (L) 135 - 145 mmol/L   Potassium 4.2 3.5 - 5.1 mmol/L   Chloride 99 98 - 111 mmol/L   CO2 23 22 - 32 mmol/L   Glucose, Bld 129 (H) 70 - 99 mg/dL   BUN 17 8 - 23 mg/dL   Creatinine, Ser 0.82 0.44 - 1.00 mg/dL   Calcium 8.5 (L) 8.9 - 10.3 mg/dL   GFR calc non Af Amer >60 >60 mL/min   GFR calc Af Amer >60 >60 mL/min   Anion gap 9 5 - 15  CBC     Status: Abnormal   Collection Time: 12/31/18  3:43 AM  Result Value Ref Range   WBC 9.0  4.0 - 10.5 K/uL   RBC 3.83 (L) 3.87 - 5.11 MIL/uL   Hemoglobin 12.3 12.0 - 15.0 g/dL   HCT 37.0 36.0 - 46.0 %   MCV 96.6 80.0 - 100.0 fL   MCH 32.1 26.0 - 34.0 pg   MCHC 33.2 30.0 - 36.0 g/dL   RDW 12.2 11.5 - 15.5 %   Platelets 236 150 - 400 K/uL   nRBC 0.0 0.0 - 0.2 %     Studies/Results Radiology     MEDS, Scheduled . diatrizoate meglumine-sodium  90 mL Per NG tube Once  . enoxaparin (LOVENOX) injection  40 mg Subcutaneous Q24H     Assessment: Small bowel obstruction ([place])   Plan: Will discuss with Fluoro about advancing NG into stomach   LOS: 1 day    Rosario Adie, MD [facility], [place]   12/31/2018 9:00 AM

## 2019-01-01 ENCOUNTER — Inpatient Hospital Stay (HOSPITAL_COMMUNITY): Payer: Medicare Other

## 2019-01-01 LAB — BASIC METABOLIC PANEL
Anion gap: 8 (ref 5–15)
BUN: 14 mg/dL (ref 8–23)
CO2: 22 mmol/L (ref 22–32)
Calcium: 7.9 mg/dL — ABNORMAL LOW (ref 8.9–10.3)
Chloride: 107 mmol/L (ref 98–111)
Creatinine, Ser: 0.67 mg/dL (ref 0.44–1.00)
GFR calc Af Amer: >60 mL/min (ref >60)
GFR calc non Af Amer: >60 mL/min (ref >60)
Glucose, Bld: 107 mg/dL — ABNORMAL HIGH (ref 70–99)
Potassium: 3.4 mmol/L — ABNORMAL LOW (ref 3.5–5.1)
Sodium: 137 mmol/L (ref 135–145)

## 2019-01-01 MED ORDER — POTASSIUM CHLORIDE 10 MEQ/100ML IV SOLN
10.0000 meq | INTRAVENOUS | Status: AC
  Administered 2019-01-01 (×4): 10 meq via INTRAVENOUS
  Filled 2019-01-01 (×4): qty 100

## 2019-01-01 MED ORDER — PHENOL 1.4 % MT LIQD
1.0000 | OROMUCOSAL | Status: DC | PRN
  Administered 2019-01-02: 1 via OROMUCOSAL
  Filled 2019-01-01: qty 177

## 2019-01-01 MED ORDER — MENTHOL 3 MG MT LOZG
1.0000 | LOZENGE | OROMUCOSAL | Status: DC | PRN
  Filled 2019-01-01: qty 9

## 2019-01-01 MED ORDER — LIP MEDEX EX OINT
TOPICAL_OINTMENT | CUTANEOUS | Status: DC | PRN
  Filled 2019-01-01: qty 7

## 2019-01-01 MED ORDER — SODIUM CHLORIDE 0.9 % IV SOLN
3.0000 g | Freq: Four times a day (QID) | INTRAVENOUS | Status: DC
  Administered 2019-01-01 – 2019-01-03 (×8): 3 g via INTRAVENOUS
  Filled 2019-01-01 (×9): qty 3

## 2019-01-01 NOTE — Progress Notes (Signed)
Pharmacy Antibiotic Note  Ashley Howard is a 73 y.o. female admitted on 12/30/2018 with SBO and aspiration PNA. Patient currently on cefepime for PNA.   Patient currently on cefepime for PNA - pharmacy consulted to transition antibiotics to ampicillin/sulbactam.   01/01/2019:  Afebrile  WBC, LA WNL  Renal fxn at baseline. SCr WNL, est CrCl ~30ml/min  Plan:  Discontinue cefepime  Order ampicillin/sulbactam 3 g IV q6h  Renal function stable. Pharmacy to sign off - dose to be monitored peripherally via data surveillance software. Please re-consult if needed.   Height: 4\' 11"  (149.9 cm) Weight: (S) 143 lb (64.9 kg) IBW/kg (Calculated) : 43.2  Temp (24hrs), Avg:98.7 F (37.1 C), Min:98.3 F (36.8 C), Max:99.5 F (37.5 C)  Recent Labs  Lab 12/30/18 1022 12/30/18 1236 12/31/18 0343 01/01/19 0344  WBC 9.4  --  9.0  --   CREATININE 0.96  --  0.82 0.67  LATICACIDVEN  --  1.7  --   --     Estimated Creatinine Clearance: 50.5 mL/min (by C-G formula based on SCr of 0.67 mg/dL).    Allergies  Allergen Reactions  . Tape Other (See Comments)    Paper tape - blisters    Antimicrobials this admission: 6/17 Vanc >> 6/18 6/17 Cefepime >> 6/19 6/19 amp/sulbactam >>  Dose adjustments this admission:  Microbiology results: 6/17 BCx: ngtd 6/17 SARSCoV2: negative 6/18 MRSA PCR: negative  Thank you for allowing pharmacy to be a part of this patient's care.  Lenis Noon, PharmD 01/01/2019 9:52 AM

## 2019-01-01 NOTE — Care Management Important Message (Signed)
Important Message  Patient Details  Name: Ashley Howard MRN: [id number] Date of Birth: 06/17/1946   Medicare Important Message Given:  Yes. CMA printed out IM for the Case Manager or LCSW to give to the patient.      Lida Berkery 01/01/2019, 8:40 AM

## 2019-01-01 NOTE — Progress Notes (Signed)
Small bowel obstruction (HCC)  Subjective: NG placed in fluoro yesterday.  Pt had a BM last night.    Objective: Vital signs in last 24 hours: Temp:  [98.3 F (36.8 C)-99.5 F (37.5 C)] 98.3 F (36.8 C) (06/19 0517) Pulse Rate:  [76-98] 88 (06/19 0749) Resp:  [16-18] 16 (06/19 0749) BP: (146-178)/(74-102) 146/74 (06/19 0749) SpO2:  [89 %-93 %] 90 % (06/19 0517) Last BM Date: 01/01/19  Intake/Output from previous day: 06/18 0701 - 06/19 0700 In: 2559.1 [I.V.:2261.1; IV Piggyback:298] Out: 2900 [Urine:1850; Emesis/NG output:1050] Intake/Output this shift: No intake/output data recorded.  General appearance: alert and cooperative GI: normal findings: soft, non-distended, nontender  Lab Results:  Results for orders placed or performed during the hospital encounter of 12/30/18 (from the past 24 hour(s))  Basic metabolic panel     Status: Abnormal   Collection Time: 01/01/19  3:44 AM  Result Value Ref Range   Sodium 137 135 - 145 mmol/L   Potassium 3.4 (L) 3.5 - 5.1 mmol/L   Chloride 107 98 - 111 mmol/L   CO2 22 22 - 32 mmol/L   Glucose, Bld 107 (H) 70 - 99 mg/dL   BUN 14 8 - 23 mg/dL   Creatinine, Ser 0.67 0.44 - 1.00 mg/dL   Calcium 7.9 (L) 8.9 - 10.3 mg/dL   GFR calc non Af Amer >60 >60 mL/min   GFR calc Af Amer >60 >60 mL/min   Anion gap 8 5 - 15     Studies/Results Radiology     MEDS, Scheduled . diatrizoate meglumine-sodium  90 mL Per NG tube Once  . enoxaparin (LOVENOX) injection  40 mg Subcutaneous Q24H     Assessment: Small bowel obstruction (HCC)   Plan: Contrast in colon on AM films Clamp NG and start clears   LOS: 2 days    Rosario Adie, MD [facility], [place] [phone]   01/01/2019 9:18 AM

## 2019-01-01 NOTE — Progress Notes (Signed)
PROGRESS NOTE    Ashley Howard  FAO:[id number] DOB: 10-29-1945 DOA: 12/30/2018 PCP: Lynnell Catalan, FNP     Brief Narrative:  Ashley Howard is a 73 y.o. female with medical history significant for multiple abdominal surgeries, scoliosis, osteoporosis, compression fracture, previous SBO status post explorative laparotomy who presents from assisted  living facility the emergency department for the evaluation of nausea, vomiting, abdominal pain.  She was apparently all right until yesterday evening.  After eating her dinner, she started having generalized abdominal pain that was worse in the epigastric region.  She describes the pain as cramping, waxing.  Her abdomen got progressively distended and she developed nausea and vomiting.  She reports multiple episodes of vomiting since then.  She reports that she had a small bowel movement in the assisted living facility.  Since then she has not passed any flatus or has a bowel movement.  Patient also reported of having some fullness in her chest. CT abdomen/pelvis done in the emergency department showed distal small bowel obstruction with transition point over the midline.  It also showed bilateral patchy infiltrates suggesting multifocal pneumonia.  COVID-19 came out  to be negative.   New events last 24 hours / Subjective: Had a bowel movement last night, no further nausea or vomiting.  Assessment & Plan:   Principal Problem:   Small bowel obstruction (HCC) Active Problems:   SBO (small bowel obstruction) (HCC)   Hyponatremia   T12 compression fracture (HCC)   HCAP (healthcare-associated pneumonia)   SBO -Appreciate general surgery following -Small bowel obstruction protocol, supportive care, IV fluid -Clear liquid diet and NG tube clamped today  Multifocal pneumonia -Could be aspiration in setting of multiple episodes of vomiting -Remains on room air without respiratory distress -MRSA PCR negative.  Stop vancomycin.  Switch to  unasyn   T12 compression fracture -CT revealed moderate T12 compression fracture, age indeterminant.  Pain control  Hyponatremia -Resolved with IVF  Hypokalemia -Replace, trend    DVT prophylaxis: Lovenox Code Status: Full Family Communication: Discussed with son over the phone this morning Disposition Plan: Pending improvement in small bowel obstruction   Consultants:   General surgery  Procedures:   None  Antimicrobials:  Anti-infectives (From admission, onward)   Start     Dose/Rate Route Frequency Ordered Stop   01/01/19 1400  Ampicillin-Sulbactam (UNASYN) 3 g in sodium chloride 0.9 % 100 mL IVPB     3 g 200 mL/hr over 30 Minutes Intravenous Every 6 hours 01/01/19 0952     12/31/18 1600  vancomycin (VANCOCIN) IVPB 750 mg/150 ml premix  Status:  Discontinued     750 mg 150 mL/hr over 60 Minutes Intravenous Every 24 hours 12/30/18 1457 12/31/18 1037   12/31/18 0200  ceFEPIme (MAXIPIME) 2 g in sodium chloride 0.9 % 100 mL IVPB  Status:  Discontinued     2 g 200 mL/hr over 30 Minutes Intravenous Every 12 hours 12/30/18 1457 01/01/19 0947   12/30/18 1245  ceFEPIme (MAXIPIME) 1 g in sodium chloride 0.9 % 100 mL IVPB     1 g 200 mL/hr over 30 Minutes Intravenous  Once 12/30/18 1236 12/30/18 1424   12/30/18 1245  vancomycin (VANCOCIN) 1,250 mg in sodium chloride 0.9 % 250 mL IVPB     1,250 mg 166.7 mL/hr over 90 Minutes Intravenous STAT 12/30/18 1244 12/30/18 1710       Objective: Vitals:   12/31/18 1403 12/31/18 2123 01/01/19 0517 01/01/19 0749  BP: (!) 178/102 (!) 146/91 Marland Kitchen)  172/81 (!) 146/74  Pulse: 76 98 79 88  Resp: 18 17 17 16   Temp: 98.4 F (36.9 C) 99.5 F (37.5 C) 98.3 F (36.8 C)   TempSrc: Oral Oral Oral   SpO2: 93% (!) 89% 90%   Weight:      Height:        Intake/Output Summary (Last 24 hours) at 01/01/2019 1051 Last data filed at 01/01/2019 1011 Gross per 24 hour  Intake 2119.1 ml  Output 2850 ml  Net -730.9 ml   Filed Weights   12/30/18  1242  Weight: (S) 64.9 kg    Examination: General exam: Appears calm and comfortable  Respiratory system: Clear to auscultation. Respiratory effort normal. Cardiovascular system: S1 & S2 heard, RRR. No JVD, murmurs, rubs, gallops or clicks. No pedal edema. Gastrointestinal system: Abdomen is nondistended, soft and nontender. No organomegaly or masses felt. Normal bowel sounds heard. Central nervous system: Alert and oriented. No focal neurological deficits. Extremities: Symmetric 5 x 5 power. Skin: No rashes, lesions or ulcers Psychiatry: Judgement and insight appear normal. Mood & affect appropriate.    Data Reviewed: I have personally reviewed following labs and imaging studies  CBC: Recent Labs  Lab 12/30/18 1022 12/31/18 0343  WBC 9.4 9.0  NEUTROABS 8.5*  --   HGB 14.2 12.3  HCT 40.3 37.0  MCV 94.2 96.6  PLT 284 124   Basic Metabolic Panel: Recent Labs  Lab 12/30/18 1022 12/31/18 0343 01/01/19 0344  NA 129* 131* 137  K 4.2 4.2 3.4*  CL 91* 99 107  CO2 24 23 22   GLUCOSE 162* 129* 107*  BUN 18 17 14   CREATININE 0.96 0.82 0.67  CALCIUM 10.1 8.5* 7.9*   GFR: Estimated Creatinine Clearance: 50.5 mL/min (by C-G formula based on SCr of 0.67 mg/dL). Liver Function Tests: Recent Labs  Lab 12/30/18 1022  AST 32  ALT 21  ALKPHOS 70  BILITOT 0.7  PROT 8.2*  ALBUMIN 4.8   Recent Labs  Lab 12/30/18 1022  LIPASE 37   No results for input(s): AMMONIA in the last 168 hours. Coagulation Profile: No results for input(s): INR, PROTIME in the last 168 hours. Cardiac Enzymes: No results for input(s): CKTOTAL, CKMB, CKMBINDEX, TROPONINI in the last 168 hours. BNP (last 3 results) No results for input(s): PROBNP in the last 8760 hours. HbA1C: No results for input(s): HGBA1C in the last 72 hours. CBG: No results for input(s): GLUCAP in the last 168 hours. Lipid Profile: No results for input(s): CHOL, HDL, LDLCALC, TRIG, CHOLHDL, LDLDIRECT in the last 72  hours. Thyroid Function Tests: No results for input(s): TSH, T4TOTAL, FREET4, T3FREE, THYROIDAB in the last 72 hours. Anemia Panel: No results for input(s): VITAMINB12, FOLATE, FERRITIN, TIBC, IRON, RETICCTPCT in the last 72 hours. Sepsis Labs: Recent Labs  Lab 12/30/18 1236  LATICACIDVEN 1.7    Recent Results (from the past 240 hour(s))  Blood culture (routine x 2)     Status: None (Preliminary result)   Collection Time: 12/30/18 12:41 PM   Specimen: BLOOD  Result Value Ref Range Status   Specimen Description   Final    BLOOD LEFT WRIST Performed at [facility], [place] [address] [place], [place] [address]    Special Requests   Final    BOTTLES DRAWN AEROBIC ONLY Blood Culture results may not be optimal due to an inadequate volume of blood received in culture bottles Performed at [facility] [address], [place], [place] [address]  Culture   Final    NO GROWTH 2 DAYS Performed at [facility], [place] [address] [place], [place] [address]    Report Status PENDING  Incomplete  SARS Coronavirus 2 (CEPHEID- Performed in [facility] hospital lab), Hosp Order     Status: None   Collection Time: 12/30/18  1:01 PM   Specimen: Nasopharyngeal Swab  Result Value Ref Range Status   SARS Coronavirus 2 NEGATIVE NEGATIVE Final    Comment: (NOTE) If result is NEGATIVE SARS-CoV-2 target nucleic acids are NOT DETECTED. The SARS-CoV-2 RNA is generally detectable in upper and lower  respiratory specimens during the acute phase of infection. The lowest  concentration of SARS-CoV-2 viral copies this assay can detect is 250  copies / mL. A negative result does not preclude SARS-CoV-2 infection  and should not be used as the sole basis for treatment or other  patient management decisions.  A negative result may occur with  improper specimen collection / handling, submission of specimen other  than nasopharyngeal swab, presence of viral mutation(s)  within the  areas targeted by this assay, and inadequate number of viral copies  (<250 copies / mL). A negative result must be combined with clinical  observations, patient history, and epidemiological information. If result is POSITIVE SARS-CoV-2 target nucleic acids are DETECTED. The SARS-CoV-2 RNA is generally detectable in upper and lower  respiratory specimens dur ing the acute phase of infection.  Positive  results are indicative of active infection with SARS-CoV-2.  Clinical  correlation with patient history and other diagnostic information is  necessary to determine patient infection status.  Positive results do  not rule out bacterial infection or co-infection with other viruses. If result is PRESUMPTIVE POSTIVE SARS-CoV-2 nucleic acids MAY BE PRESENT.   A presumptive positive result was obtained on the submitted specimen  and confirmed on repeat testing.  While 2019 novel coronavirus  (SARS-CoV-2) nucleic acids may be present in the submitted sample  additional confirmatory testing may be necessary for epidemiological  and / or clinical management purposes  to differentiate between  SARS-CoV-2 and other Sarbecovirus currently known to infect humans.  If clinically indicated additional testing with an alternate test  methodology [phone]) is advised. The SARS-CoV-2 RNA is generally  detectable in upper and lower respiratory sp ecimens during the acute  phase of infection. The expected result is Negative. Fact Sheet for Patients:  StrictlyIdeas.no Fact Sheet for Healthcare Providers: BankingDealers.co.za This test is not yet approved or cleared by the [place] FDA and has been authorized for detection and/or diagnosis of SARS-CoV-2 by FDA under an Emergency Use Authorization (EUA).  This EUA will remain in effect (meaning this test can be used) for the duration of the COVID-19 declaration under Section 564(b)(1) of the Act,  21 U.S.C. section 360bbb-3(b)(1), unless the authorization is terminated or revoked sooner. Performed at [facility], [place] [address], [place], [place] [address]   Blood culture (routine x 2)     Status: None (Preliminary result)   Collection Time: 12/30/18  1:20 PM   Specimen: BLOOD RIGHT FOREARM  Result Value Ref Range Status   Specimen Description   Final    BLOOD RIGHT FOREARM Performed at [facility] [address], [place], [place] [address]    Special Requests   Final    BOTTLES DRAWN AEROBIC AND ANAEROBIC Blood Culture adequate volume Performed at [place] [address] [place], [place] [address]  Culture   Final    NO GROWTH 2 DAYS Performed at [facility], [place] [address], [place], [place] [address]    Report Status PENDING  Incomplete  MRSA PCR Screening     Status: None   Collection Time: 12/31/18  8:45 AM   Specimen: Nasal Mucosa; Nasopharyngeal  Result Value Ref Range Status   MRSA by PCR NEGATIVE NEGATIVE Final    Comment:        The GeneXpert MRSA Assay (FDA approved for NASAL specimens only), is one component of a comprehensive MRSA colonization surveillance program. It is not intended to diagnose MRSA infection nor to guide or monitor treatment for MRSA infections. Performed at [facility], [place] [address] [place], [place] [address]        Radiology Studies: Ct Abdomen Pelvis W Contrast  Result Date: 12/30/2018 CLINICAL DATA:  Epigastric pain and constipation.  Vomiting. EXAM: CT ABDOMEN AND PELVIS WITH CONTRAST TECHNIQUE: Multidetector CT imaging of the abdomen and pelvis was performed using the standard protocol following bolus administration of intravenous contrast. CONTRAST:  178mL OMNIPAQUE IOHEXOL 300 MG/ML  SOLN COMPARISON:  08/21/2016 FINDINGS: Lower chest: Lung bases demonstrate patchy airspace process right worse than left likely due to multifocal  pneumonia. No effusion. Fluid-filled dilated distal esophagus with fluid-filled moderate size hiatal hernia. Moderate narrowing of the stomach as it passes through the diaphragmatic hiatus. Hepatobiliary: Prior cholecystectomy. Liver is within normal. Biliary tree is unremarkable. Pancreas: Normal. Spleen: Normal. Adrenals/Urinary Tract: Adrenal glands are normal. Kidneys are normal in size without focal mass or hydronephrosis. Subcentimeter right renal cortical hypodensity too small to characterize but likely a cyst. Ureters and bladder are normal. Stomach/Bowel: Moderate-sized hiatal hernia with narrowing of the hernia as passes through the diaphragmatic hiatus. Multiple fluid-filled dilated small bowel loops with the ileum measuring up to 5.4 cm in diameter. There is an abrupt transition point of the ileum in the midline of the mid to lower abdomen just below the level of the aortic bifurcation with associated whirling of the midline mesentery. Remainder of the distal ileum is decompressed. Small amount of air and stool throughout the colon which is otherwise mildly decompressed. Moderate diverticulosis of the descending and sigmoid colon. Appendix not visualized. Vascular/Lymphatic: Moderate calcified plaque over the abdominal aorta and iliac arteries. No adenopathy. Reproductive: Prior hysterectomy. Other: No free peritoneal air and no significant free fluid. Musculoskeletal: Degenerate changes spine with the level disc disease over the lumbar spine. Stable grade 1 anterolisthesis of L3 on L4 and L4 on L5. Mild stable compression deformities of L2 and L4. Moderate T12 compression fracture age indeterminate but new since the previous exam. IMPRESSION: 1. Distal small bowel obstruction with abrupt transition point over the midline mid to lower abdomen involving the ileum with associated whirling of the mesentery. Small bowel measures up to 5.4 cm in diameter. Obstruction likely due to adhesions. 2. Patchy  airspace process over the lung bases right worse than left compatible with multifocal pneumonia. 3. Moderate size hiatal hernia containing fluid with fluid over the distal esophagus and marked narrowing of the hernia as it passes through the diaphragmatic hiatus. Possible degree of obstruction. 4. Moderate T12 compression fracture age indeterminate but new since the prior exam. Stable mild compression deformities of L2 and L4. 5. Colonic diverticulosis. Subcentimeter right renal cortical hypodensity too small to characterize but likely a cyst. Other postsurgical changes as described. 6.  Aortic Atherosclerosis (ICD10-I70.0). Electronically Signed   By: Marin Olp M.D.  On: 12/30/2018 12:25   Dg Abd Portable 1v  Result Date: 01/01/2019 CLINICAL DATA:  Small-bowel obstruction. EXAM: PORTABLE ABDOMEN - 1 VIEW COMPARISON:  12/31/2018. FINDINGS: NG tube noted coiled stomach. Improvement of small-bowel distention. Oral contrast throughout the colon on today's exam. Degenerative changes scoliosis lumbar spine. Stable upper lumbar compression fracture. Degenerative change both hips. IMPRESSION: NG tube noted coiled stomach. Improvement of small-bowel distention. Oral contrast noted throughout the colon on today's exam. No free air. Electronically Signed   By: Marcello Moores  Register   On: 01/01/2019 07:18   Dg Abd Portable 1v-small Bowel Obstruction Protocol-initial, 8 Hr Delay  Result Date: 12/31/2018 CLINICAL DATA:  Small bowel protocol 8 hour delay EXAM: PORTABLE ABDOMEN - 1 VIEW COMPARISON:  Six 03/02/2019, 12/30/2018, CT 12/30/2018 FINDINGS: Esophageal tube tip overlies the proximal duodenum. Little change in multiple loops of dilated small bowel, with prominent right lower quadrant bowel loop measuring up to 7.5 cm. Some contrast in the right upper quadrant is presumably within the hepatic flexure of the colon. IMPRESSION: 1. Little interval change in multiple loops of dilated small consistent with a bowel  obstruction. A small amount of contrast is visible in the right colon. Electronically Signed   By: Donavan Foil M.D.   On: 12/31/2018 22:35   Dg Abd Portable 1v  Result Date: 12/30/2018 CLINICAL DATA:  NG tube repositioned EXAM: PORTABLE ABDOMEN - 1 VIEW COMPARISON:  None. FINDINGS: NG tube again noted coiling in the lower chest, likely within the hiatal hernia. IMPRESSION: NG tube coils in the lower chest, likely in the hiatal hernia. Electronically Signed   By: Rolm Baptise M.D.   On: 12/30/2018 20:02   Dg Abd Portable 1v-small Bowel Protocol-position Verification  Result Date: 12/30/2018 CLINICAL DATA:  Nasogastric tube placement EXAM: PORTABLE ABDOMEN - 1 VIEW COMPARISON:  Portable exam 1630 hours compared to earlier CT of 12/30/2018 FINDINGS: Tip of nasogastric tube projects over stomach though the proximal side-port projects over the distal esophagus, recommend advancing tube 7-8 cm. Gaseous distention of bowel loops in the mid abdomen and pelvis. No definite bowel wall thickening. Excreted contrast material within renal collecting systems and bladder. Bones demineralized with dextroconvex thoracolumbar scoliosis and scattered degenerative changes. IMPRESSION: Recommend advancing nasogastric tube 7-8 cm. Electronically Signed   By: Lavonia Dana M.D.   On: 12/30/2018 17:05   Dg Addison Bailey G Tube Plc W/fl W/rad  Result Date: 12/31/2018 CLINICAL DATA:  Requires NG tube placement. EXAM: NASO G TUBE PLACEMENT WITH FL AND WITH RAD CONTRAST:  48mL OMNIPAQUE IOHEXOL 300 MG/ML  SOLN FLUOROSCOPY TIME:  Fluoroscopy Time:  48 seconds Radiation Exposure Index (if provided by the fluoroscopic device): 11.9 mGy Number of Acquired Spot Images: 0 COMPARISON:  None. FINDINGS: The indwelling nasogastric tube was manipulated under direct fluoroscopic guidance a into the proximal duodenum just beyond the pylorus period. Injection of water-soluble contrast material confirms placement of tube tip within the proximal duodenum.  IMPRESSION: 1. Successful post pyloric placement of indwelling nasogastric tube. Electronically Signed   By: Kerby Moors M.D.   On: 12/31/2018 13:37      Scheduled Meds: . diatrizoate meglumine-sodium  90 mL Per NG tube Once  . enoxaparin (LOVENOX) injection  40 mg Subcutaneous Q24H   Continuous Infusions: . sodium chloride 100 mL/hr at 01/01/19 0753  . ampicillin-sulbactam (UNASYN) IV    . potassium chloride 10 mEq (01/01/19 1037)     LOS: 2 days    Time spent: 25 minutes   Dessa Phi,  DO [facility] [email or web address] 01/01/2019, 10:51 AM

## 2019-01-02 ENCOUNTER — Inpatient Hospital Stay (HOSPITAL_COMMUNITY): Payer: Medicare Other

## 2019-01-02 LAB — MAGNESIUM: Magnesium: 2.3 mg/dL (ref 1.7–2.4)

## 2019-01-02 LAB — BASIC METABOLIC PANEL
Anion gap: 9 (ref 5–15)
BUN: 8 mg/dL (ref 8–23)
CO2: 24 mmol/L (ref 22–32)
Calcium: 8.3 mg/dL — ABNORMAL LOW (ref 8.9–10.3)
Chloride: 102 mmol/L (ref 98–111)
Creatinine, Ser: 0.65 mg/dL (ref 0.44–1.00)
GFR calc Af Amer: >60 mL/min (ref >60)
GFR calc non Af Amer: >60 mL/min (ref >60)
Glucose, Bld: 101 mg/dL — ABNORMAL HIGH (ref 70–99)
Potassium: 3.3 mmol/L — ABNORMAL LOW (ref 3.5–5.1)
Sodium: 135 mmol/L (ref 135–145)

## 2019-01-02 MED ORDER — POTASSIUM CHLORIDE 10 MEQ/100ML IV SOLN
10.0000 meq | INTRAVENOUS | Status: AC
  Administered 2019-01-02 (×4): 10 meq via INTRAVENOUS
  Filled 2019-01-02 (×3): qty 100

## 2019-01-02 NOTE — Progress Notes (Signed)
IVF rate decreased and continuous pulse ox initiated. Will continue to monitor

## 2019-01-02 NOTE — Evaluation (Signed)
Physical Therapy Evaluation Patient Details Name: Ashley Howard MRN: [id number] DOB: October 20, 1945 Today's Date: 01/02/2019   History of Present Illness  73 yo female admitted with SBO. Hx of SBO, multiple abd surgeries, hiatal hernia, comp fx, scoliosis, oteoporosis  Clinical Impression  On eval, pt required Min guard-Min assist for mobility. She walked ~120 feet with a RW. Pt presents with general weakness. Will follow and progress activity as tolerated. Plan is for pt to return to ALF.     Follow Up Recommendations Home health PT;Supervision - Intermittent    Equipment Recommendations  None recommended by PT    Recommendations for Other Services       Precautions / Restrictions Precautions Precautions: Fall Restrictions Weight Bearing Restrictions: No      Mobility  Bed Mobility               General bed mobility comments: oob in recliner  Transfers Overall transfer level: Needs assistance Equipment used: Rolling walker (2 wheeled) Transfers: Sit to/from Stand Sit to Stand: Min assist         General transfer comment: Assist to steady. Increased time. VCs safety  Ambulation/Gait Ambulation/Gait assistance: Min assist Gait Distance (Feet): 120 Feet Assistive device: Rolling walker (2 wheeled) Gait Pattern/deviations: Step-through pattern;Decreased stride length     General Gait Details: Intermittent assist to steady but mostly Min guard. Pt tolerated distance well.  Stairs            Wheelchair Mobility    Modified Rankin (Stroke Patients Only)       Balance Overall balance assessment: Mild deficits observed, not formally tested                                           Pertinent Vitals/Pain Pain Assessment: Faces Faces Pain Scale: Hurts even more Pain Location: abd, chest, nose, back Pain Descriptors / Indicators: Sore;Discomfort Pain Intervention(s): Monitored during session    Home Living Family/patient expects  to be discharged to:: Assisted living               Home Equipment: Walker - 2 wheels;Walker - 4 wheels Additional Comments: using the RW for the doctor and the Rollator in the facility     Prior Function Level of Independence: Independent with assistive device(s)               Hand Dominance        Extremity/Trunk Assessment   Upper Extremity Assessment Upper Extremity Assessment: Defer to OT evaluation    Lower Extremity Assessment Lower Extremity Assessment: Generalized weakness    Cervical / Trunk Assessment Cervical / Trunk Assessment: Kyphotic  Communication   Communication: No difficulties  Cognition Arousal/Alertness: Awake/alert Behavior During Therapy: WFL for tasks assessed/performed Overall Cognitive Status: Within Functional Limits for tasks assessed                                        General Comments      Exercises     Assessment/Plan    PT Assessment Patient needs continued PT services  PT Problem List Decreased strength;Decreased mobility;Decreased balance;Decreased activity tolerance       PT Treatment Interventions DME instruction;Gait training;Therapeutic exercise;Therapeutic activities;Patient/family education;Functional mobility training;Balance training    PT Goals (Current goals can be found in the  Care Plan section)  Acute Rehab PT Goals Patient Stated Goal: to get better PT Goal Formulation: With patient Time For Goal Achievement: 01/15/19 Potential to Achieve Goals: Good    Frequency Min 3X/week   Barriers to discharge        Co-evaluation               AM-PAC PT "6 Clicks" Mobility  Outcome Measure Help needed turning from your back to your side while in a flat bed without using bedrails?: A Little Help needed moving from lying on your back to sitting on the side of a flat bed without using bedrails?: A Little Help needed moving to and from a bed to a chair (including a wheelchair)?: A  Little Help needed standing up from a chair using your arms (e.g., wheelchair or bedside chair)?: A Little Help needed to walk in hospital room?: A Little Help needed climbing 3-5 steps with a railing? : A Little 6 Click Score: 18    End of Session   Activity Tolerance: Patient tolerated treatment well Patient left: in chair;with call bell/phone within reach;with chair alarm set   PT Visit Diagnosis: Muscle weakness (generalized) (M62.81);Unsteadiness on feet (R26.81)    Time: 8768-1157 PT Time Calculation (min) (ACUTE ONLY): 17 min   Charges:   PT Evaluation $PT Eval Moderate Complexity: [place], PT Acute Rehabilitation Services Pager: [phone] Office: [phone]

## 2019-01-02 NOTE — Progress Notes (Signed)
Pt reports that she feels short of breath. POX 96% on room air but she has developed a productive cough. Findings reported to Dr. Maylene Roes and new orders received. Will continue to monitor

## 2019-01-02 NOTE — Progress Notes (Signed)
Small bowel obstruction (HCC)  Subjective: Pt did not tolerate NG clamping.  Still with high bilious output but having bowel movements.    Objective: Vital signs in last 24 hours: Temp:  [98.2 F (36.8 C)-99.7 F (37.6 C)] 99 F (37.2 C) (06/20 0512) Pulse Rate:  [72-85] 85 (06/20 0512) Resp:  [18-20] 20 (06/20 0512) BP: (135-173)/(74-83) 141/78 (06/20 0512) SpO2:  [94 %-97 %] 95 % (06/20 0512) Last BM Date: 01/01/19  Intake/Output from previous day: 06/19 0701 - 06/20 0700 In: 2337.6 [P.O.:180; I.V.:1360.6; IV Piggyback:797] Out: 2000 [Urine:700; Emesis/NG output:1300] Intake/Output this shift: Total I/O In: 0  Out: 200 [Emesis/NG output:200]  General appearance: alert and cooperative GI: normal findings: soft, non-distended, nontender  Lab Results:  Results for orders placed or performed during the hospital encounter of 12/30/18 (from the past 24 hour(s))  Basic metabolic panel     Status: Abnormal   Collection Time: 01/02/19  4:16 AM  Result Value Ref Range   Sodium 135 135 - 145 mmol/L   Potassium 3.3 (L) 3.5 - 5.1 mmol/L   Chloride 102 98 - 111 mmol/L   CO2 24 22 - 32 mmol/L   Glucose, Bld 101 (H) 70 - 99 mg/dL   BUN 8 8 - 23 mg/dL   Creatinine, Ser 0.65 0.44 - 1.00 mg/dL   Calcium 8.3 (L) 8.9 - 10.3 mg/dL   GFR calc non Af Amer >60 >60 mL/min   GFR calc Af Amer >60 >60 mL/min   Anion gap 9 5 - 15  Magnesium     Status: None   Collection Time: 01/02/19  4:16 AM  Result Value Ref Range   Magnesium 2.3 1.7 - 2.4 mg/dL     Studies/Results Radiology     MEDS, Scheduled . diatrizoate meglumine-sodium  90 mL Per NG tube Once  . enoxaparin (LOVENOX) injection  40 mg Subcutaneous Q24H     Assessment: Small bowel obstruction (HCC)   Plan: Contrast in colon on AM films and pt having bowel function but still with high NG outputs Cont NG to LWS today Will possibly try clamping trial again tomorrow   LOS: 3 days    Rosario Adie, [facility], [place] [phone]   01/02/2019 10:15 AM

## 2019-01-02 NOTE — Progress Notes (Signed)
PROGRESS NOTE    Ashley Howard  ACZ:[id number] DOB: 1946/01/31 DOA: 12/30/2018 PCP: Lynnell Catalan, FNP     Brief Narrative:  Ashley Howard is a 73 y.o. female with medical history significant for multiple abdominal surgeries, scoliosis, osteoporosis, compression fracture, previous SBO status post explorative laparotomy who presents from assisted  living facility the emergency department for the evaluation of nausea, vomiting, abdominal pain.  She was apparently all right until yesterday evening.  After eating her dinner, she started having generalized abdominal pain that was worse in the epigastric region.  She describes the pain as cramping, waxing.  Her abdomen got progressively distended and she developed nausea and vomiting.  She reports multiple episodes of vomiting since then.  She reports that she had a small bowel movement in the assisted living facility.  Since then she has not passed any flatus or has a bowel movement.  Patient also reported of having some fullness in her chest. CT abdomen/pelvis done in the emergency department showed distal small bowel obstruction with transition point over the midline.  It also showed bilateral patchy infiltrates suggesting multifocal pneumonia.  COVID-19 came out  to be negative.   New events last 24 hours / Subjective: Did not tolerate NG tube clamping yesterday.  Remains in low intermittent suction this morning.  No new complaints today.  Had bowel movement, has productive cough  Assessment & Plan:   Principal Problem:   Small bowel obstruction (HCC) Active Problems:   SBO (small bowel obstruction) (HCC)   Hyponatremia   T12 compression fracture (HCC)   HCAP (healthcare-associated pneumonia)   SBO -Appreciate general surgery following -Small bowel obstruction protocol, supportive care, IV fluid -Continue NG tube to low intermittent suction  Multifocal pneumonia -Could be aspiration in setting of multiple episodes of  vomiting -Remains on room air without respiratory distress -MRSA PCR negative.  Stop vancomycin. -Continue unasyn   T12 compression fracture -CT revealed moderate T12 compression fracture, age indeterminant.  Pain control  Hypokalemia -Mag 2.3 -Replace, trend    DVT prophylaxis: Lovenox Code Status: Full Family Communication: None Disposition Plan: Pending improvement in small bowel obstruction   Consultants:   General surgery  Procedures:   None  Antimicrobials:  Anti-infectives (From admission, onward)   Start     Dose/Rate Route Frequency Ordered Stop   01/01/19 1400  Ampicillin-Sulbactam (UNASYN) 3 g in sodium chloride 0.9 % 100 mL IVPB     3 g 200 mL/hr over 30 Minutes Intravenous Every 6 hours 01/01/19 0952     12/31/18 1600  vancomycin (VANCOCIN) IVPB 750 mg/150 ml premix  Status:  Discontinued     750 mg 150 mL/hr over 60 Minutes Intravenous Every 24 hours 12/30/18 1457 12/31/18 1037   12/31/18 0200  ceFEPIme (MAXIPIME) 2 g in sodium chloride 0.9 % 100 mL IVPB  Status:  Discontinued     2 g 200 mL/hr over 30 Minutes Intravenous Every 12 hours 12/30/18 1457 01/01/19 0947   12/30/18 1245  ceFEPIme (MAXIPIME) 1 g in sodium chloride 0.9 % 100 mL IVPB     1 g 200 mL/hr over 30 Minutes Intravenous  Once 12/30/18 1236 12/30/18 1424   12/30/18 1245  vancomycin (VANCOCIN) 1,250 mg in sodium chloride 0.9 % 250 mL IVPB     1,250 mg 166.7 mL/hr over 90 Minutes Intravenous STAT 12/30/18 1244 12/30/18 1710       Objective: Vitals:   01/01/19 1329 01/01/19 1654 01/01/19 2123 01/02/19 0512  BP: (!) 166/74  135/75 (!) 173/83 (!) 141/78  Pulse: 72 77 82 85  Resp: 18 18 20 20   Temp: 98.4 F (36.9 C) 99.7 F (37.6 C) 98.2 F (36.8 C) 99 F (37.2 C)  TempSrc: Oral Oral Oral Oral  SpO2: 97% 94% 95% 95%  Weight:      Height:        Intake/Output Summary (Last 24 hours) at 01/02/2019 1032 Last data filed at 01/02/2019 1002 Gross per 24 hour  Intake 1815.81 ml   Output 1900 ml  Net -84.19 ml   Filed Weights   12/30/18 1242  Weight: (S) 64.9 kg    Examination: General exam: Appears calm and comfortable  Respiratory system: Clear to auscultation. Respiratory effort normal. Cardiovascular system: S1 & S2 heard, RRR. No JVD, murmurs, rubs, gallops or clicks. No pedal edema. Gastrointestinal system: Abdomen is nondistended, soft and nontender. No organomegaly or masses felt. Normal bowel sounds heard.  NG tube with bilious output Central nervous system: Alert and oriented. No focal neurological deficits. Extremities: Symmetric 5 x 5 power. Skin: No rashes, lesions or ulcers Psychiatry: Judgement and insight appear normal. Mood & affect appropriate.    Data Reviewed: I have personally reviewed following labs and imaging studies  CBC: Recent Labs  Lab 12/30/18 1022 12/31/18 0343  WBC 9.4 9.0  NEUTROABS 8.5*  --   HGB 14.2 12.3  HCT 40.3 37.0  MCV 94.2 96.6  PLT 284 614   Basic Metabolic Panel: Recent Labs  Lab 12/30/18 1022 12/31/18 0343 01/01/19 0344 01/02/19 0416  NA 129* 131* 137 135  K 4.2 4.2 3.4* 3.3*  CL 91* 99 107 102  CO2 24 23 22 24   GLUCOSE 162* 129* 107* 101*  BUN 18 17 14 8   CREATININE 0.96 0.82 0.67 0.65  CALCIUM 10.1 8.5* 7.9* 8.3*  MG  --   --   --  2.3   GFR: Estimated Creatinine Clearance: 50.5 mL/min (by C-G formula based on SCr of 0.65 mg/dL). Liver Function Tests: Recent Labs  Lab 12/30/18 1022  AST 32  ALT 21  ALKPHOS 70  BILITOT 0.7  PROT 8.2*  ALBUMIN 4.8   Recent Labs  Lab 12/30/18 1022  LIPASE 37   No results for input(s): AMMONIA in the last 168 hours. Coagulation Profile: No results for input(s): INR, PROTIME in the last 168 hours. Cardiac Enzymes: No results for input(s): CKTOTAL, CKMB, CKMBINDEX, TROPONINI in the last 168 hours. BNP (last 3 results) No results for input(s): PROBNP in the last 8760 hours. HbA1C: No results for input(s): HGBA1C in the last 72 hours. CBG: No  results for input(s): GLUCAP in the last 168 hours. Lipid Profile: No results for input(s): CHOL, HDL, LDLCALC, TRIG, CHOLHDL, LDLDIRECT in the last 72 hours. Thyroid Function Tests: No results for input(s): TSH, T4TOTAL, FREET4, T3FREE, THYROIDAB in the last 72 hours. Anemia Panel: No results for input(s): VITAMINB12, FOLATE, FERRITIN, TIBC, IRON, RETICCTPCT in the last 72 hours. Sepsis Labs: Recent Labs  Lab 12/30/18 1236  LATICACIDVEN 1.7    Recent Results (from the past 240 hour(s))  Blood culture (routine x 2)     Status: None (Preliminary result)   Collection Time: 12/30/18 12:41 PM   Specimen: BLOOD  Result Value Ref Range Status   Specimen Description   Final    BLOOD LEFT WRIST Performed at [facility], [place] [address] [place], [place] [address]    Special Requests   Final    BOTTLES DRAWN AEROBIC ONLY  Blood Culture results may not be optimal due to an inadequate volume of blood received in culture bottles Performed at [facility] [address], [place], [place] [address]    Culture   Final    NO GROWTH 2 DAYS Performed at [facility] [address], [place], [place] [address]    Report Status PENDING  Incomplete  SARS Coronavirus 2 (CEPHEID- Performed in [facility] hospital lab), Hosp Order     Status: None   Collection Time: 12/30/18  1:01 PM   Specimen: Nasopharyngeal Swab  Result Value Ref Range Status   SARS Coronavirus 2 NEGATIVE NEGATIVE Final    Comment: (NOTE) If result is NEGATIVE SARS-CoV-2 target nucleic acids are NOT DETECTED. The SARS-CoV-2 RNA is generally detectable in upper and lower  respiratory specimens during the acute phase of infection. The lowest  concentration of SARS-CoV-2 viral copies this assay can detect is 250  copies / mL. A negative result does not preclude SARS-CoV-2 infection  and should not be used as the sole basis for treatment or other  patient management decisions.  A  negative result may occur with  improper specimen collection / handling, submission of specimen other  than nasopharyngeal swab, presence of viral mutation(s) within the  areas targeted by this assay, and inadequate number of viral copies  (<250 copies / mL). A negative result must be combined with clinical  observations, patient history, and epidemiological information. If result is POSITIVE SARS-CoV-2 target nucleic acids are DETECTED. The SARS-CoV-2 RNA is generally detectable in upper and lower  respiratory specimens dur ing the acute phase of infection.  Positive  results are indicative of active infection with SARS-CoV-2.  Clinical  correlation with patient history and other diagnostic information is  necessary to determine patient infection status.  Positive results do  not rule out bacterial infection or co-infection with other viruses. If result is PRESUMPTIVE POSTIVE SARS-CoV-2 nucleic acids MAY BE PRESENT.   A presumptive positive result was obtained on the submitted specimen  and confirmed on repeat testing.  While 2019 novel coronavirus  (SARS-CoV-2) nucleic acids may be present in the submitted sample  additional confirmatory testing may be necessary for epidemiological  and / or clinical management purposes  to differentiate between  SARS-CoV-2 and other Sarbecovirus currently known to infect humans.  If clinically indicated additional testing with an alternate test  methodology [phone]) is advised. The SARS-CoV-2 RNA is generally  detectable in upper and lower respiratory sp ecimens during the acute  phase of infection. The expected result is Negative. Fact Sheet for Patients:  StrictlyIdeas.no Fact Sheet for Healthcare Providers: BankingDealers.co.za This test is not yet approved or cleared by the [place] FDA and has been authorized for detection and/or diagnosis of SARS-CoV-2 by FDA under an Emergency Use  Authorization (EUA).  This EUA will remain in effect (meaning this test can be used) for the duration of the COVID-19 declaration under Section 564(b)(1) of the Act, 21 U.S.C. section 360bbb-3(b)(1), unless the authorization is terminated or revoked sooner. Performed at [facility], [place] [address] [place], [place] [address]   Blood culture (routine x 2)     Status: None (Preliminary result)   Collection Time: 12/30/18  1:20 PM   Specimen: BLOOD RIGHT FOREARM  Result Value Ref Range Status   Specimen Description   Final    BLOOD RIGHT FOREARM Performed at [facility] [address], [place], [place] [address]  Special Requests   Final    BOTTLES DRAWN AEROBIC AND ANAEROBIC Blood Culture adequate volume Performed at [facility] [address] [place], [place] [address]    Culture   Final    NO GROWTH 2 DAYS Performed at [facility] [address] [place], [place] [address]    Report Status PENDING  Incomplete  MRSA PCR Screening     Status: None   Collection Time: 12/31/18  8:45 AM   Specimen: Nasal Mucosa; Nasopharyngeal  Result Value Ref Range Status   MRSA by PCR NEGATIVE NEGATIVE Final    Comment:        The GeneXpert MRSA Assay (FDA approved for NASAL specimens only), is one component of a comprehensive MRSA colonization surveillance program. It is not intended to diagnose MRSA infection nor to guide or monitor treatment for MRSA infections. Performed at [facility], [place] [address], [place],  [address]        Radiology Studies: Dg Abd Portable 1v  Result Date: 01/02/2019 CLINICAL DATA:  Small bowel obstruction EXAM: PORTABLE ABDOMEN - 1 VIEW COMPARISON:  Abdominal radiograph from 1 day prior FINDINGS: Enteric tube terminates in the distal stomach. Cholecystectomy clips are seen in the right upper quadrant of the abdomen. Stable mildly dilated small bowel loops in  the left upper abdomen. Retained oral contrast throughout the colon transiting to the rectum. No evidence of pneumatosis or pneumoperitoneum. IMPRESSION: 1. Enteric tube tip is in the distal stomach. 2. Stable mildly dilated small bowel loops in the left upper abdomen. Retained oral contrast throughout the colon transiting to the rectum. Findings suggest resolving small bowel obstruction. Electronically Signed   By: Ilona Sorrel M.D.   On: 01/02/2019 08:48   Dg Abd Portable 1v  Result Date: 01/01/2019 CLINICAL DATA:  Small-bowel obstruction. EXAM: PORTABLE ABDOMEN - 1 VIEW COMPARISON:  12/31/2018. FINDINGS: NG tube noted coiled stomach. Improvement of small-bowel distention. Oral contrast throughout the colon on today's exam. Degenerative changes scoliosis lumbar spine. Stable upper lumbar compression fracture. Degenerative change both hips. IMPRESSION: NG tube noted coiled stomach. Improvement of small-bowel distention. Oral contrast noted throughout the colon on today's exam. No free air. Electronically Signed   By: Marcello Moores  Register   On: 01/01/2019 07:18   Dg Abd Portable 1v-small Bowel Obstruction Protocol-initial, 8 Hr Delay  Result Date: 12/31/2018 CLINICAL DATA:  Small bowel protocol 8 hour delay EXAM: PORTABLE ABDOMEN - 1 VIEW COMPARISON:  Six 03/02/2019, 12/30/2018, CT 12/30/2018 FINDINGS: Esophageal tube tip overlies the proximal duodenum. Little change in multiple loops of dilated small bowel, with prominent right lower quadrant bowel loop measuring up to 7.5 cm. Some contrast in the right upper quadrant is presumably within the hepatic flexure of the colon. IMPRESSION: 1. Little interval change in multiple loops of dilated small consistent with a bowel obstruction. A small amount of contrast is visible in the right colon. Electronically Signed   By: Donavan Foil M.D.   On: 12/31/2018 22:35   Dg Addison Bailey G Tube Plc W/fl W/rad  Result Date: 12/31/2018 CLINICAL DATA:  Requires NG tube placement.  EXAM: NASO G TUBE PLACEMENT WITH FL AND WITH RAD CONTRAST:  30mL OMNIPAQUE IOHEXOL 300 MG/ML  SOLN FLUOROSCOPY TIME:  Fluoroscopy Time:  48 seconds Radiation Exposure Index (if provided by the fluoroscopic device): 11.9 mGy Number of Acquired Spot Images: 0 COMPARISON:  None. FINDINGS: The indwelling nasogastric tube was manipulated under direct fluoroscopic guidance a into the proximal duodenum  just beyond the pylorus period. Injection of water-soluble contrast material confirms placement of tube tip within the proximal duodenum. IMPRESSION: 1. Successful post pyloric placement of indwelling nasogastric tube. Electronically Signed   By: Kerby Moors M.D.   On: 12/31/2018 13:37      Scheduled Meds: . diatrizoate meglumine-sodium  90 mL Per NG tube Once  . enoxaparin (LOVENOX) injection  40 mg Subcutaneous Q24H   Continuous Infusions: . sodium chloride 75 mL/hr at 01/02/19 0600  . ampicillin-sulbactam (UNASYN) IV 3 g (01/02/19 0844)  . potassium chloride 10 mEq (01/02/19 0929)     LOS: 3 days    Time spent: 25 minutes   Dessa Phi, DO [facility] [email or web address] 01/02/2019, 10:32 AM

## 2019-01-02 NOTE — Evaluation (Signed)
Occupational Therapy Evaluation Patient Details Name: Ashley Howard MRN: [id number] DOB: 1946-03-01 Today's Date: 01/02/2019    History of Present Illness 73 yo female admitted with SBO. Hx of SBO, multiple abd surgeries, hiatal hernia, comp fx, scoliosis, oteoporosis   Clinical Impression   PT admitted with SBO. Pt currently with functional limitiations due to the deficits listed below (see OT problem list). PT currently min to min guard with RW transfer. Pt with incontinence of bowel reported to OT and does void urine and stool with BSC.  Pt will benefit from skilled OT to increase their independence and safety with adls and balance to allow discharge ALF with HHOT.     Follow Up Recommendations  Home health OT    Equipment Recommendations  None recommended by OT    Recommendations for Other Services       Precautions / Restrictions Precautions Precautions: Fall Restrictions Weight Bearing Restrictions: No      Mobility Bed Mobility Overal bed mobility: Needs Assistance Bed Mobility: Supine to Sit     Supine to sit: Min guard     General bed mobility comments: A to manage NG tube. pt used bed rail briefly during transfer and HOB > 30 degrees  Transfers Overall transfer level: Needs assistance Equipment used: Rolling walker (2 wheeled) Transfers: Sit to/from Stand Sit to Stand: Min guard         General transfer comment: pt requires incr time and reliant on Rw    Balance Overall balance assessment: Mild deficits observed, not formally tested                                         ADL either performed or assessed with clinical judgement   ADL Overall ADL's : Needs assistance/impaired Eating/Feeding: Set up;Sitting Eating/Feeding Details (indicate cue type and reason): ice chips Grooming: Set up;Sitting   Upper Body Bathing: Set up   Lower Body Bathing: Moderate assistance           Toilet Transfer: Minimal assistance;RW    Toileting- Clothing Manipulation and Hygiene: Maximal assistance Toileting - Clothing Manipulation Details (indicate cue type and reason): peri care s/p void of bowel        General ADL Comments: pt limited due to suction to NG tube to small space next to bed.      Vision Baseline Vision/History: Wears glasses Wears Glasses: At all times       Perception     Praxis      Pertinent Vitals/Pain Pain Assessment: Faces Faces Pain Scale: Hurts little more Pain Location: stomach Pain Descriptors / Indicators: Sore;Discomfort Pain Intervention(s): Monitored during session;Premedicated before session;Repositioned     Hand Dominance Right   Extremity/Trunk Assessment Upper Extremity Assessment Upper Extremity Assessment: Overall WFL for tasks assessed   Lower Extremity Assessment Lower Extremity Assessment: Defer to PT evaluation   Cervical / Trunk Assessment Cervical / Trunk Assessment: Kyphotic   Communication Communication Communication: No difficulties   Cognition Arousal/Alertness: Awake/alert Behavior During Therapy: WFL for tasks assessed/performed Overall Cognitive Status: Within Functional Limits for tasks assessed                                     General Comments  pt coughing up secretions throughout session into bag.     Exercises  Shoulder Instructions      Home Living Family/patient expects to be discharged to:: Assisted living                             Home Equipment: Gilford Rile - 2 wheels;Walker - 4 wheels   Additional Comments: using the RW for the doctor and the Rollator in the facility       Prior Functioning/Environment Level of Independence: Independent with assistive device(s)                 OT Problem List: Decreased strength;Decreased range of motion;Impaired balance (sitting and/or standing);Decreased activity tolerance;Decreased knowledge of use of DME or AE;Decreased knowledge of  precautions;Pain      OT Treatment/Interventions: Self-care/ADL training;Therapeutic exercise;Neuromuscular education;Energy conservation;DME and/or AE instruction;Manual therapy;Therapeutic activities;Patient/family education;Balance training    OT Goals(Current goals can be found in the care plan section) Acute Rehab OT Goals Patient Stated Goal: to be able to go back to [facility] OT Goal Formulation: With patient Time For Goal Achievement: 01/16/19 Potential to Achieve Goals: Good  OT Frequency: Min 2X/week   Barriers to D/C:            Co-evaluation              AM-PAC OT "6 Clicks" Daily Activity     Outcome Measure Help from another person eating meals?: A Little Help from another person taking care of personal grooming?: A Little Help from another person toileting, which includes using toliet, bedpan, or urinal?: A Little Help from another person bathing (including washing, rinsing, drying)?: A Little Help from another person to put on and taking off regular upper body clothing?: A Little Help from another person to put on and taking off regular lower body clothing?: A Lot 6 Click Score: 17   End of Session Equipment Utilized During Treatment: Rolling walker;Gait belt Nurse Communication: Mobility status;Precautions  Activity Tolerance: Patient tolerated treatment well Patient left: in chair;with call bell/phone within reach;with chair alarm set  OT Visit Diagnosis: Unsteadiness on feet (R26.81);Muscle weakness (generalized) (M62.81)                Time: 4917-9150 OT Time Calculation (min): 24 min Charges:  OT General Charges $OT Visit: 1 Visit OT Evaluation $OT Eval Moderate Complexity: 1 Mod   Jeri Modena, OTR/L  Acute Rehabilitation Services Pager: [phone] Office: [phone] .   Jeri Modena 01/02/2019, 12:00 PM

## 2019-01-03 ENCOUNTER — Inpatient Hospital Stay (HOSPITAL_COMMUNITY): Payer: Medicare Other

## 2019-01-03 LAB — BASIC METABOLIC PANEL WITH GFR
Anion gap: 12 (ref 5–15)
BUN: 10 mg/dL (ref 8–23)
CO2: 22 mmol/L (ref 22–32)
Calcium: 8.3 mg/dL — ABNORMAL LOW (ref 8.9–10.3)
Chloride: 103 mmol/L (ref 98–111)
Creatinine, Ser: 0.61 mg/dL (ref 0.44–1.00)
GFR calc Af Amer: >60 mL/min
GFR calc non Af Amer: >60 mL/min
Glucose, Bld: 91 mg/dL (ref 70–99)
Potassium: 3.2 mmol/L — ABNORMAL LOW (ref 3.5–5.1)
Sodium: 137 mmol/L (ref 135–145)

## 2019-01-03 LAB — MAGNESIUM: Magnesium: 2.2 mg/dL (ref 1.7–2.4)

## 2019-01-03 MED ORDER — POTASSIUM CHLORIDE IN NACL 40-0.9 MEQ/L-% IV SOLN
INTRAVENOUS | Status: DC
  Administered 2019-01-03 – 2019-01-09 (×7): 50 mL/h via INTRAVENOUS
  Filled 2019-01-03 (×10): qty 1000

## 2019-01-03 MED ORDER — PIPERACILLIN-TAZOBACTAM 3.375 G IVPB
3.3750 g | Freq: Three times a day (TID) | INTRAVENOUS | Status: DC
  Administered 2019-01-03 – 2019-01-10 (×21): 3.375 g via INTRAVENOUS
  Filled 2019-01-03 (×19): qty 50

## 2019-01-03 NOTE — Progress Notes (Signed)
PROGRESS NOTE    Ashley Howard  ZOX:[id number] DOB: 21-Dec-1945 DOA: 12/30/2018 PCP: Lynnell Catalan, FNP     Brief Narrative:  Ashley Howard is a 73 y.o. female with medical history significant for multiple abdominal surgeries, scoliosis, osteoporosis, compression fracture, tobacco abuse, previous SBO status post explorative laparotomy who presents from assisted  living facility the emergency department for the evaluation of nausea, vomiting, abdominal pain.  She was apparently all right until yesterday evening.  After eating her dinner, she started having generalized abdominal pain that was worse in the epigastric region.  She describes the pain as cramping, waxing.  Her abdomen got progressively distended and she developed nausea and vomiting.  She reports multiple episodes of vomiting since then.  She reports that she had a small bowel movement in the assisted living facility.  Since then she has not passed any flatus or has a bowel movement.  Patient also reported of having some fullness in her chest. CT abdomen/pelvis done in the emergency department showed distal small bowel obstruction with transition point over the midline.  It also showed bilateral patchy infiltrates suggesting multifocal pneumonia.  COVID-19 came out  to be negative.   New events last 24 hours / Subjective: Still with bilious output through NG. Breathing much improved since yesterday afternoon, still having productive cough. Had fever last night 100.7. Remains on unasyn.   Assessment & Plan:   Principal Problem:   Small bowel obstruction (HCC) Active Problems:   SBO (small bowel obstruction) (HCC)   Hyponatremia   T12 compression fracture (HCC)   HCAP (healthcare-associated pneumonia)   SBO -Appreciate general surgery following -Small bowel obstruction protocol, supportive care, IV fluid -Continue NG tube to low intermittent suction  Multifocal pneumonia -Could be aspiration in setting of multiple  episodes of vomiting -Remains on room air without respiratory distress -MRSA PCR negative.  Stop vancomycin. -Due to fever last night, will broaden antibiotics to Zosyn today -CXR pending   T12 compression fracture -CT revealed moderate T12 compression fracture, age indeterminant.  Pain control  Hypokalemia -Replace, trend    DVT prophylaxis: Lovenox Code Status: Full Family Communication: Updated son over the phone  Disposition Plan: Pending improvement in small bowel obstruction   Consultants:   General surgery  Procedures:   None  Antimicrobials:  Anti-infectives (From admission, onward)   Start     Dose/Rate Route Frequency Ordered Stop   01/01/19 1400  Ampicillin-Sulbactam (UNASYN) 3 g in sodium chloride 0.9 % 100 mL IVPB     3 g 200 mL/hr over 30 Minutes Intravenous Every 6 hours 01/01/19 0952     12/31/18 1600  vancomycin (VANCOCIN) IVPB 750 mg/150 ml premix  Status:  Discontinued     750 mg 150 mL/hr over 60 Minutes Intravenous Every 24 hours 12/30/18 1457 12/31/18 1037   12/31/18 0200  ceFEPIme (MAXIPIME) 2 g in sodium chloride 0.9 % 100 mL IVPB  Status:  Discontinued     2 g 200 mL/hr over 30 Minutes Intravenous Every 12 hours 12/30/18 1457 01/01/19 0947   12/30/18 1245  ceFEPIme (MAXIPIME) 1 g in sodium chloride 0.9 % 100 mL IVPB     1 g 200 mL/hr over 30 Minutes Intravenous  Once 12/30/18 1236 12/30/18 1424   12/30/18 1245  vancomycin (VANCOCIN) 1,250 mg in sodium chloride 0.9 % 250 mL IVPB     1,250 mg 166.7 mL/hr over 90 Minutes Intravenous STAT 12/30/18 1244 12/30/18 1710       Objective: Vitals:  01/02/19 1313 01/02/19 1446 01/02/19 2146 01/03/19 0913  BP: (!) 154/73  (!) 159/92 (!) 157/88  Pulse: 78 90 85 78  Resp: 18  16   Temp: (!) 100.7 F (38.2 C) 99.8 F (37.7 C) 99.1 F (37.3 C)   TempSrc: Oral Oral Oral   SpO2: 94% 96% 96% 93%  Weight:      Height:        Intake/Output Summary (Last 24 hours) at 01/03/2019 1026 Last data filed  at 01/03/2019 0600 Gross per 24 hour  Intake 2204.09 ml  Output 1000 ml  Net 1204.09 ml   Filed Weights   12/30/18 1242  Weight: (S) 64.9 kg    Examination: General exam: Appears calm and comfortable  Respiratory system: Coarse breath sounds bilaterally, on room air  Cardiovascular system: S1 & S2 heard, RRR. No JVD, murmurs, rubs, gallops or clicks. No pedal edema.  Gastrointestinal system: Abdomen is nondistended, soft and nontender. No organomegaly or masses felt. Diminished bowel sounds. +NGT with bilious output  Central nervous system: Alert and oriented. No focal neurological deficits. Extremities: Symmetric 5 x 5 power. Skin: No rashes, lesions or ulcers Psychiatry: Judgement and insight appear normal. Mood & affect appropriate.    Data Reviewed: I have personally reviewed following labs and imaging studies  CBC: Recent Labs  Lab 12/30/18 1022 12/31/18 0343  WBC 9.4 9.0  NEUTROABS 8.5*  --   HGB 14.2 12.3  HCT 40.3 37.0  MCV 94.2 96.6  PLT 284 696   Basic Metabolic Panel: Recent Labs  Lab 12/30/18 1022 12/31/18 0343 01/01/19 0344 01/02/19 0416 01/03/19 0327  NA 129* 131* 137 135 137  K 4.2 4.2 3.4* 3.3* 3.2*  CL 91* 99 107 102 103  CO2 24 23 22 24 22   GLUCOSE 162* 129* 107* 101* 91  BUN 18 17 14 8 10   CREATININE 0.96 0.82 0.67 0.65 0.61  CALCIUM 10.1 8.5* 7.9* 8.3* 8.3*  MG  --   --   --  2.3 2.2   GFR: Estimated Creatinine Clearance: 50.5 mL/min (by C-G formula based on SCr of 0.61 mg/dL). Liver Function Tests: Recent Labs  Lab 12/30/18 1022  AST 32  ALT 21  ALKPHOS 70  BILITOT 0.7  PROT 8.2*  ALBUMIN 4.8   Recent Labs  Lab 12/30/18 1022  LIPASE 37   No results for input(s): AMMONIA in the last 168 hours. Coagulation Profile: No results for input(s): INR, PROTIME in the last 168 hours. Cardiac Enzymes: No results for input(s): CKTOTAL, CKMB, CKMBINDEX, TROPONINI in the last 168 hours. BNP (last 3 results) No results for input(s):  PROBNP in the last 8760 hours. HbA1C: No results for input(s): HGBA1C in the last 72 hours. CBG: No results for input(s): GLUCAP in the last 168 hours. Lipid Profile: No results for input(s): CHOL, HDL, LDLCALC, TRIG, CHOLHDL, LDLDIRECT in the last 72 hours. Thyroid Function Tests: No results for input(s): TSH, T4TOTAL, FREET4, T3FREE, THYROIDAB in the last 72 hours. Anemia Panel: No results for input(s): VITAMINB12, FOLATE, FERRITIN, TIBC, IRON, RETICCTPCT in the last 72 hours. Sepsis Labs: Recent Labs  Lab 12/30/18 1236  LATICACIDVEN 1.7    Recent Results (from the past 240 hour(s))  Blood culture (routine x 2)     Status: None (Preliminary result)   Collection Time: 12/30/18 12:41 PM   Specimen: BLOOD  Result Value Ref Range Status   Specimen Description   Final    BLOOD LEFT WRIST Performed at [facility],  [place] [address] [place], [place] [address]    Special Requests   Final    BOTTLES DRAWN AEROBIC ONLY Blood Culture results may not be optimal due to an inadequate volume of blood received in culture bottles Performed at [facility] [address] [place], [place] [address]    Culture   Final    NO GROWTH 3 DAYS Performed at [facility], [place] [address], [place], [place] [address]    Report Status PENDING  Incomplete  SARS Coronavirus 2 (CEPHEID- Performed in [facility] hospital lab), Hosp Order     Status: None   Collection Time: 12/30/18  1:01 PM   Specimen: Nasopharyngeal Swab  Result Value Ref Range Status   SARS Coronavirus 2 NEGATIVE NEGATIVE Final    Comment: (NOTE) If result is NEGATIVE SARS-CoV-2 target nucleic acids are NOT DETECTED. The SARS-CoV-2 RNA is generally detectable in upper and lower  respiratory specimens during the acute phase of infection. The lowest  concentration of SARS-CoV-2 viral copies this assay can detect is 250  copies / mL. A negative result does not preclude SARS-CoV-2 infection   and should not be used as the sole basis for treatment or other  patient management decisions.  A negative result may occur with  improper specimen collection / handling, submission of specimen other  than nasopharyngeal swab, presence of viral mutation(s) within the  areas targeted by this assay, and inadequate number of viral copies  (<250 copies / mL). A negative result must be combined with clinical  observations, patient history, and epidemiological information. If result is POSITIVE SARS-CoV-2 target nucleic acids are DETECTED. The SARS-CoV-2 RNA is generally detectable in upper and lower  respiratory specimens dur ing the acute phase of infection.  Positive  results are indicative of active infection with SARS-CoV-2.  Clinical  correlation with patient history and other diagnostic information is  necessary to determine patient infection status.  Positive results do  not rule out bacterial infection or co-infection with other viruses. If result is PRESUMPTIVE POSTIVE SARS-CoV-2 nucleic acids MAY BE PRESENT.   A presumptive positive result was obtained on the submitted specimen  and confirmed on repeat testing.  While 2019 novel coronavirus  (SARS-CoV-2) nucleic acids may be present in the submitted sample  additional confirmatory testing may be necessary for epidemiological  and / or clinical management purposes  to differentiate between  SARS-CoV-2 and other Sarbecovirus currently known to infect humans.  If clinically indicated additional testing with an alternate test  methodology [phone]) is advised. The SARS-CoV-2 RNA is generally  detectable in upper and lower respiratory sp ecimens during the acute  phase of infection. The expected result is Negative. Fact Sheet for Patients:  StrictlyIdeas.no Fact Sheet for Healthcare Providers: BankingDealers.co.za This test is not yet approved or cleared by the [place] FDA  and has been authorized for detection and/or diagnosis of SARS-CoV-2 by FDA under an Emergency Use Authorization (EUA).  This EUA will remain in effect (meaning this test can be used) for the duration of the COVID-19 declaration under Section 564(b)(1) of the Act, 21 U.S.C. section 360bbb-3(b)(1), unless the authorization is terminated or revoked sooner. Performed at [facility], [place] [address] [place], [place] [address]   Blood culture (routine x 2)     Status: None (Preliminary result)   Collection Time: 12/30/18  1:20 PM   Specimen: BLOOD RIGHT FOREARM  Result Value Ref Range Status   Specimen Description  Final    BLOOD RIGHT FOREARM Performed at [facility] [address], [place], [place] [address]    Special Requests   Final    BOTTLES DRAWN AEROBIC AND ANAEROBIC Blood Culture adequate volume Performed at [facility] [address], [place], [place] [address]    Culture   Final    NO GROWTH 3 DAYS Performed at [facility], [place] [address] [place],  [address]    Report Status PENDING  Incomplete  MRSA PCR Screening     Status: None   Collection Time: 12/31/18  8:45 AM   Specimen: Nasal Mucosa; Nasopharyngeal  Result Value Ref Range Status   MRSA by PCR NEGATIVE NEGATIVE Final    Comment:        The GeneXpert MRSA Assay (FDA approved for NASAL specimens only), is one component of a comprehensive MRSA colonization surveillance program. It is not intended to diagnose MRSA infection nor to guide or monitor treatment for MRSA infections. Performed at [facility], [place] [address] [place],  [address]        Radiology Studies: Dg Abd Portable 1v  Result Date: 01/02/2019 CLINICAL DATA:  Small bowel obstruction EXAM: PORTABLE ABDOMEN - 1 VIEW COMPARISON:  Abdominal radiograph from 1 day prior FINDINGS: Enteric tube terminates in the distal stomach. Cholecystectomy  clips are seen in the right upper quadrant of the abdomen. Stable mildly dilated small bowel loops in the left upper abdomen. Retained oral contrast throughout the colon transiting to the rectum. No evidence of pneumatosis or pneumoperitoneum. IMPRESSION: 1. Enteric tube tip is in the distal stomach. 2. Stable mildly dilated small bowel loops in the left upper abdomen. Retained oral contrast throughout the colon transiting to the rectum. Findings suggest resolving small bowel obstruction. Electronically Signed   By: Ilona Sorrel M.D.   On: 01/02/2019 08:48      Scheduled Meds: . diatrizoate meglumine-sodium  90 mL Per NG tube Once  . enoxaparin (LOVENOX) injection  40 mg Subcutaneous Q24H   Continuous Infusions: . 0.9 % NaCl with KCl 40 mEq / L 50 mL/hr (01/03/19 0954)  . ampicillin-sulbactam (UNASYN) IV 3 g (01/03/19 0908)     LOS: 4 days    Time spent: 25 minutes   Dessa Phi, DO [facility] [email or web address] 01/03/2019, 10:26 AM

## 2019-01-03 NOTE — Progress Notes (Signed)
Small bowel obstruction (HCC)  Subjective:   Still with high bilious output but having bowel movements.  Not taking in much PO  Objective: Vital signs in last 24 hours: Temp:  [99.1 F (37.3 C)-100.7 F (38.2 C)] 99.1 F (37.3 C) (06/20 2146) Pulse Rate:  [78-90] 78 (06/21 0913) Resp:  [16-18] 16 (06/20 2146) BP: (154-159)/(73-92) 157/88 (06/21 0913) SpO2:  [93 %-96 %] 93 % (06/21 0913) Last BM Date: 01/02/19  Intake/Output from previous day: 06/20 0701 - 06/21 0700 In: 2204.1 [P.O.:120; I.V.:1371.3; IV Piggyback:712.8] Out: 1200 [Emesis/NG output:1200] Intake/Output this shift: No intake/output data recorded.  General appearance: alert and cooperative GI: normal findings: soft, non-distended, nontender  Lab Results:  Results for orders placed or performed during the hospital encounter of 12/30/18 (from the past 24 hour(s))  Basic metabolic panel     Status: Abnormal   Collection Time: 01/03/19  3:27 AM  Result Value Ref Range   Sodium 137 135 - 145 mmol/L   Potassium 3.2 (L) 3.5 - 5.1 mmol/L   Chloride 103 98 - 111 mmol/L   CO2 22 22 - 32 mmol/L   Glucose, Bld 91 70 - 99 mg/dL   BUN 10 8 - 23 mg/dL   Creatinine, Ser 0.61 0.44 - 1.00 mg/dL   Calcium 8.3 (L) 8.9 - 10.3 mg/dL   GFR calc non Af Amer >60 >60 mL/min   GFR calc Af Amer >60 >60 mL/min   Anion gap 12 5 - 15  Magnesium     Status: None   Collection Time: 01/03/19  3:27 AM  Result Value Ref Range   Magnesium 2.2 1.7 - 2.4 mg/dL     Studies/Results Radiology     MEDS, Scheduled . diatrizoate meglumine-sodium  90 mL Per NG tube Once  . enoxaparin (LOVENOX) injection  40 mg Subcutaneous Q24H     Assessment: Small bowel obstruction (HCC)   Plan: Contrast in colon but still with high NG outputs Cont NG to LWS today Will possibly try clamping trial again tomorrow. Repeat AXR in AM   LOS: 4 days    Rosario Adie, MD [facility], [place]   01/03/2019 9:24  AM

## 2019-01-04 ENCOUNTER — Inpatient Hospital Stay (HOSPITAL_COMMUNITY): Payer: Medicare Other

## 2019-01-04 LAB — CULTURE, BLOOD (ROUTINE X 2)
Culture: NO GROWTH
Culture: NO GROWTH
Special Requests: ADEQUATE

## 2019-01-04 LAB — CBC
HCT: 38.7 % (ref 36.0–46.0)
Hemoglobin: 12.2 g/dL (ref 12.0–15.0)
MCH: 31.8 pg (ref 26.0–34.0)
MCHC: 31.5 g/dL (ref 30.0–36.0)
MCV: 100.8 fL — ABNORMAL HIGH (ref 80.0–100.0)
Platelets: 286 10*3/uL (ref 150–400)
RBC: 3.84 MIL/uL — ABNORMAL LOW (ref 3.87–5.11)
RDW: 12.1 % (ref 11.5–15.5)
WBC: 8.1 10*3/uL (ref 4.0–10.5)
nRBC: 0 % (ref 0.0–0.2)

## 2019-01-04 LAB — BASIC METABOLIC PANEL
Anion gap: 15 (ref 5–15)
BUN: 13 mg/dL (ref 8–23)
CO2: 18 mmol/L — ABNORMAL LOW (ref 22–32)
Calcium: 8.4 mg/dL — ABNORMAL LOW (ref 8.9–10.3)
Chloride: 106 mmol/L (ref 98–111)
Creatinine, Ser: 0.64 mg/dL (ref 0.44–1.00)
GFR calc Af Amer: >60 mL/min (ref >60)
GFR calc non Af Amer: >60 mL/min (ref >60)
Glucose, Bld: 77 mg/dL (ref 70–99)
Potassium: 3.4 mmol/L — ABNORMAL LOW (ref 3.5–5.1)
Sodium: 139 mmol/L (ref 135–145)

## 2019-01-04 LAB — MAGNESIUM: Magnesium: 2.3 mg/dL (ref 1.7–2.4)

## 2019-01-04 NOTE — Progress Notes (Signed)
Occupational Therapy Treatment Patient Details Name: Ashley Howard MRN: [id number] DOB: 29-Jan-1946 Today's Date: 01/04/2019    History of present illness 73 yo female admitted with SBO. Hx of SBO, multiple abd surgeries, hiatal hernia, comp fx, scoliosis, oteoporosis   OT comments  Pt reports burping and very pleased to be able to have some broth  Follow Up Recommendations  Home health OT    Equipment Recommendations  None recommended by OT    Recommendations for Other Services      Precautions / Restrictions Precautions Precautions: Fall Restrictions Weight Bearing Restrictions: No       Mobility Bed Mobility Overal bed mobility: Needs Assistance Bed Mobility: Supine to Sit;Rolling Rolling: Min guard   Supine to sit: Min assist;HOB elevated Sit to supine: Min guard;HOB elevated   General bed mobility comments: close guard for safety, lines.  Transfers Overall transfer level: Needs assistance Equipment used: Rolling walker (2 wheeled) Transfers: Sit to/from [facility] Sit to Stand: Min guard Stand pivot transfers: Min guard       General transfer comment: close guard for safety.    Balance Overall balance assessment: Mild deficits observed, not formally tested                                         ADL either performed or assessed with clinical judgement   ADL Overall ADL's : Needs assistance/impaired     Grooming: Wash/dry face;Wash/dry hands;Sitting;Minimal assistance                   Toilet Transfer: Minimal assistance;RW;Cueing for sequencing;Stand-pivot;Moderate assistance   Toileting- Clothing Manipulation and Hygiene: Minimal assistance;Cueing for sequencing;Cueing for safety;Sit to/from stand;Moderate assistance               Vision Baseline Vision/History: No visual deficits     Perception     Praxis      Cognition Arousal/Alertness: Awake/alert Behavior During Therapy: WFL for tasks  assessed/performed Overall Cognitive Status: Within Functional Limits for tasks assessed                                                     Pertinent Vitals/ Pain       Pain Score: 3  Pain Location: stomach Pain Descriptors / Indicators: Dull Pain Intervention(s): Monitored during session     Prior Functioning/Environment              Frequency  Min 2X/week        Progress Toward Goals  OT Goals(current goals can now be found in the care plan section)  Progress towards OT goals: Progressing toward goals     Plan Discharge plan remains appropriate       AM-PAC OT "6 Clicks" Daily Activity     Outcome Measure   Help from another person eating meals?: A Little Help from another person taking care of personal grooming?: A Little Help from another person toileting, which includes using toliet, bedpan, or urinal?: A Little Help from another person bathing (including washing, rinsing, drying)?: A Little Help from another person to put on and taking off regular upper body clothing?: A Little Help from another person to put on and taking off regular lower body clothing?: A Lot  6 Click Score: 17    End of Session Equipment Utilized During Treatment: Rolling walker;Gait belt  OT Visit Diagnosis: Unsteadiness on feet (R26.81);Muscle weakness (generalized) (M62.81)   Activity Tolerance Patient tolerated treatment well   Patient Left in chair;with call bell/phone within reach;with chair alarm set   Nurse Communication Mobility status;Precautions        Time: 1050-1105 OT Time Calculation (min): 15 min  Charges: OT General Charges $OT Visit: 1 Visit OT Treatments $Self Care/Home Management : 8-22 mins  Kari Baars, [place] [phone] Office- [phone]      Shandria Clinch, Edwena Felty D 01/04/2019, 1:00 PM

## 2019-01-04 NOTE — Progress Notes (Signed)
Physical Therapy Treatment Patient Details Name: Ashley Howard MRN: [id number] DOB: Dec 09, 1945 Today's Date: 01/04/2019    History of Present Illness 73 yo female admitted with SBO. Hx of SBO, multiple abd surgeries, hiatal hernia, comp fx, scoliosis, oteoporosis    PT Comments    Progressing well with mobility.    Follow Up Recommendations  Home health PT;Supervision - Intermittent     Equipment Recommendations  None recommended by PT    Recommendations for Other Services       Precautions / Restrictions Precautions Precautions: Fall Restrictions Weight Bearing Restrictions: No    Mobility  Bed Mobility Overal bed mobility: Needs Assistance Bed Mobility: Sit to Supine       Sit to supine: Min guard;HOB elevated   General bed mobility comments: close guard for safety, lines.  Transfers Overall transfer level: Needs assistance Equipment used: Rolling walker (2 wheeled) Transfers: Sit to/from Stand Sit to Stand: Min guard         General transfer comment: close guard for safety.  Ambulation/Gait Ambulation/Gait assistance: Min guard Gait Distance (Feet): 500 Feet Assistive device: Rolling walker (2 wheeled) Gait Pattern/deviations: Step-through pattern     General Gait Details: close guard for safety. dyspnea 2/4   Stairs             Wheelchair Mobility    Modified Rankin (Stroke Patients Only)       Balance                                            Cognition Arousal/Alertness: Awake/alert Behavior During Therapy: WFL for tasks assessed/performed Overall Cognitive Status: Within Functional Limits for tasks assessed                                        Exercises      General Comments        Pertinent Vitals/Pain      Home Living                      Prior Function            PT Goals (current goals can now be found in the care plan section) Progress towards PT goals:  Progressing toward goals    Frequency    Min 3X/week      PT Plan Current plan remains appropriate    Co-evaluation              AM-PAC PT "6 Clicks" Mobility   Outcome Measure  Help needed turning from your back to your side while in a flat bed without using bedrails?: A Little Help needed moving from lying on your back to sitting on the side of a flat bed without using bedrails?: A Little Help needed moving to and from a bed to a chair (including a wheelchair)?: A Little Help needed standing up from a chair using your arms (e.g., wheelchair or bedside chair)?: A Little Help needed to walk in hospital room?: A Little Help needed climbing 3-5 steps with a railing? : A Little 6 Click Score: 18    End of Session   Activity Tolerance: Patient tolerated treatment well Patient left: in bed;with call bell/phone within reach   PT Visit Diagnosis: Muscle weakness (generalized) (M62.81);Unsteadiness on  feet (R26.81)     Time: 1655-3748 PT Time Calculation (min) (ACUTE ONLY): 17 min  Charges:  $Gait Training: 8-22 mins                        Weston Anna, PT Acute Rehabilitation Services Pager: [phone] Office: [phone]

## 2019-01-04 NOTE — Progress Notes (Signed)
PROGRESS NOTE    Ashley Howard  NUU:[id number] DOB: April 22, 1946 DOA: 12/30/2018 PCP: Lynnell Catalan, FNP     Brief Narrative:  Ashley Howard is a 73 y.o. female with medical history significant for multiple abdominal surgeries, scoliosis, osteoporosis, compression fracture, tobacco abuse, previous SBO status post explorative laparotomy who presents from assisted  living facility the emergency department for the evaluation of nausea, vomiting, abdominal pain.  She was apparently all right until yesterday evening.  After eating her dinner, she started having generalized abdominal pain that was worse in the epigastric region.  She describes the pain as cramping, waxing.  Her abdomen got progressively distended and she developed nausea and vomiting.  She reports multiple episodes of vomiting since then.  She reports that she had a small bowel movement in the assisted living facility.  Since then she has not passed any flatus or has a bowel movement.  Patient also reported of having some fullness in her chest. CT abdomen/pelvis done in the emergency department showed distal small bowel obstruction with transition point over the midline.  It also showed bilateral patchy infiltrates suggesting multifocal pneumonia.  COVID-19 came out  to be negative.   New events last 24 hours / Subjective: States that she feels a little bit better today.  Having bowel movements.  NG tube was clamped this morning by general surgery.  Breathing improved as well.  Assessment & Plan:   Principal Problem:   Small bowel obstruction (HCC) Active Problems:   SBO (small bowel obstruction) (HCC)   Hyponatremia   T12 compression fracture (HCC)   HCAP (healthcare-associated pneumonia)   SBO -Appreciate general surgery following -Small bowel obstruction protocol, supportive care, IV fluid -NG tube clamped -Repeat abdominal x-ray reveals persistent distended loops of small bowel, oral contrast continue to transit  through the colon  Multifocal pneumonia -Could be aspiration in setting of multiple episodes of vomiting -Remains on room air without respiratory distress -MRSA PCR negative.  Stop vancomycin. -Due to fever on 6/21, antibiotic was broadened to Zosyn -CXR 6/21 with mild hazy right costophrenic angle opacity  T12 compression fracture -CT revealed moderate T12 compression fracture, age indeterminant.  Pain control  Hypokalemia -Replace, trend    DVT prophylaxis: Lovenox Code Status: Full Family Communication: None Disposition Plan: Pending improvement in small bowel obstruction   Consultants:   General surgery  Procedures:   None  Antimicrobials:  Anti-infectives (From admission, onward)   Start     Dose/Rate Route Frequency Ordered Stop   01/03/19 1400  piperacillin-tazobactam (ZOSYN) IVPB 3.375 g     3.375 g 12.5 mL/hr over 240 Minutes Intravenous Every 8 hours 01/03/19 1031     01/01/19 1400  Ampicillin-Sulbactam (UNASYN) 3 g in sodium chloride 0.9 % 100 mL IVPB  Status:  Discontinued     3 g 200 mL/hr over 30 Minutes Intravenous Every 6 hours 01/01/19 0952 01/03/19 1031   12/31/18 1600  vancomycin (VANCOCIN) IVPB 750 mg/150 ml premix  Status:  Discontinued     750 mg 150 mL/hr over 60 Minutes Intravenous Every 24 hours 12/30/18 1457 12/31/18 1037   12/31/18 0200  ceFEPIme (MAXIPIME) 2 g in sodium chloride 0.9 % 100 mL IVPB  Status:  Discontinued     2 g 200 mL/hr over 30 Minutes Intravenous Every 12 hours 12/30/18 1457 01/01/19 0947   12/30/18 1245  ceFEPIme (MAXIPIME) 1 g in sodium chloride 0.9 % 100 mL IVPB     1 g 200 mL/hr over 30  Minutes Intravenous  Once 12/30/18 1236 12/30/18 1424   12/30/18 1245  vancomycin (VANCOCIN) 1,250 mg in sodium chloride 0.9 % 250 mL IVPB     1,250 mg 166.7 mL/hr over 90 Minutes Intravenous STAT 12/30/18 1244 12/30/18 1710       Objective: Vitals:   01/03/19 0913 01/03/19 1239 01/03/19 2035 01/04/19 0522  BP: (!) 157/88 (!)  161/89 (!) 178/86 (!) 157/72  Pulse: 78 98 79 78  Resp:  18 18 18   Temp:  100.2 F (37.9 C) 98.5 F (36.9 C) 98.4 F (36.9 C)  TempSrc:  Oral Oral Oral  SpO2: 93% 98% 96% 95%  Weight:      Height:        Intake/Output Summary (Last 24 hours) at 01/04/2019 1323 Last data filed at 01/04/2019 1000 Gross per 24 hour  Intake 1356.98 ml  Output 1700 ml  Net -343.02 ml   Filed Weights   12/30/18 1242  Weight: (S) 64.9 kg    Examination: General exam: Appears calm and comfortable  Respiratory system: Diminished breath sounds, improved from examination yesterday.  Respiratory effort is normal without any increased effort Cardiovascular system: S1 & S2 heard, RRR. No JVD, murmurs, rubs, gallops or clicks. No pedal edema. Gastrointestinal system: Abdomen is nondistended, soft and nontender. No organomegaly or masses felt. + NG tube clamped Central nervous system: Alert and oriented. No focal neurological deficits. Extremities: Symmetric 5 x 5 power. Skin: No rashes, lesions or ulcers Psychiatry: Judgement and insight appear normal. Mood & affect appropriate.    Data Reviewed: I have personally reviewed following labs and imaging studies  CBC: Recent Labs  Lab 12/30/18 1022 12/31/18 0343 01/04/19 0318  WBC 9.4 9.0 8.1  NEUTROABS 8.5*  --   --   HGB 14.2 12.3 12.2  HCT 40.3 37.0 38.7  MCV 94.2 96.6 100.8*  PLT 284 236 333   Basic Metabolic Panel: Recent Labs  Lab 12/31/18 0343 01/01/19 0344 01/02/19 0416 01/03/19 0327 01/04/19 0318  NA 131* 137 135 137 139  K 4.2 3.4* 3.3* 3.2* 3.4*  CL 99 107 102 103 106  CO2 23 22 24 22  18*  GLUCOSE 129* 107* 101* 91 77  BUN 17 14 8 10 13   CREATININE 0.82 0.67 0.65 0.61 0.64  CALCIUM 8.5* 7.9* 8.3* 8.3* 8.4*  MG  --   --  2.3 2.2 2.3   GFR: Estimated Creatinine Clearance: 50.5 mL/min (by C-G formula based on SCr of 0.64 mg/dL). Liver Function Tests: Recent Labs  Lab 12/30/18 1022  AST 32  ALT 21  ALKPHOS 70  BILITOT  0.7  PROT 8.2*  ALBUMIN 4.8   Recent Labs  Lab 12/30/18 1022  LIPASE 37   No results for input(s): AMMONIA in the last 168 hours. Coagulation Profile: No results for input(s): INR, PROTIME in the last 168 hours. Cardiac Enzymes: No results for input(s): CKTOTAL, CKMB, CKMBINDEX, TROPONINI in the last 168 hours. BNP (last 3 results) No results for input(s): PROBNP in the last 8760 hours. HbA1C: No results for input(s): HGBA1C in the last 72 hours. CBG: No results for input(s): GLUCAP in the last 168 hours. Lipid Profile: No results for input(s): CHOL, HDL, LDLCALC, TRIG, CHOLHDL, LDLDIRECT in the last 72 hours. Thyroid Function Tests: No results for input(s): TSH, T4TOTAL, FREET4, T3FREE, THYROIDAB in the last 72 hours. Anemia Panel: No results for input(s): VITAMINB12, FOLATE, FERRITIN, TIBC, IRON, RETICCTPCT in the last 72 hours. Sepsis Labs: Recent Labs  Lab 12/30/18 1236  LATICACIDVEN 1.7    Recent Results (from the past 240 hour(s))  Blood culture (routine x 2)     Status: None   Collection Time: 12/30/18 12:41 PM   Specimen: BLOOD  Result Value Ref Range Status   Specimen Description   Final    BLOOD LEFT WRIST Performed at [facility] [address] [place], [place] [address]    Special Requests   Final    BOTTLES DRAWN AEROBIC ONLY Blood Culture results may not be optimal due to an inadequate volume of blood received in culture bottles Performed at [facility] [address] [place], [place] [address]    Culture   Final    NO GROWTH 5 DAYS Performed at [facility], [place] [address] [place],  [address]    Report Status 01/04/2019 FINAL  Final  SARS Coronavirus 2 (CEPHEID- Performed in [facility] hospital lab), Hosp Order     Status: None   Collection Time: 12/30/18  1:01 PM   Specimen: Nasopharyngeal Swab  Result Value Ref Range Status   SARS Coronavirus 2 NEGATIVE NEGATIVE Final    Comment:  (NOTE) If result is NEGATIVE SARS-CoV-2 target nucleic acids are NOT DETECTED. The SARS-CoV-2 RNA is generally detectable in upper and lower  respiratory specimens during the acute phase of infection. The lowest  concentration of SARS-CoV-2 viral copies this assay can detect is 250  copies / mL. A negative result does not preclude SARS-CoV-2 infection  and should not be used as the sole basis for treatment or other  patient management decisions.  A negative result may occur with  improper specimen collection / handling, submission of specimen other  than nasopharyngeal swab, presence of viral mutation(s) within the  areas targeted by this assay, and inadequate number of viral copies  (<250 copies / mL). A negative result must be combined with clinical  observations, patient history, and epidemiological information. If result is POSITIVE SARS-CoV-2 target nucleic acids are DETECTED. The SARS-CoV-2 RNA is generally detectable in upper and lower  respiratory specimens dur ing the acute phase of infection.  Positive  results are indicative of active infection with SARS-CoV-2.  Clinical  correlation with patient history and other diagnostic information is  necessary to determine patient infection status.  Positive results do  not rule out bacterial infection or co-infection with other viruses. If result is PRESUMPTIVE POSTIVE SARS-CoV-2 nucleic acids MAY BE PRESENT.   A presumptive positive result was obtained on the submitted specimen  and confirmed on repeat testing.  While 2019 novel coronavirus  (SARS-CoV-2) nucleic acids may be present in the submitted sample  additional confirmatory testing may be necessary for epidemiological  and / or clinical management purposes  to differentiate between  SARS-CoV-2 and other Sarbecovirus currently known to infect humans.  If clinically indicated additional testing with an alternate test  methodology [phone]) is advised. The SARS-CoV-2 RNA is  generally  detectable in upper and lower respiratory sp ecimens during the acute  phase of infection. The expected result is Negative. Fact Sheet for Patients:  StrictlyIdeas.no Fact Sheet for Healthcare Providers: BankingDealers.co.za This test is not yet approved or cleared by the [place] FDA and has been authorized for detection and/or diagnosis of SARS-CoV-2 by FDA under an Emergency Use Authorization (EUA).  This EUA will remain in effect (meaning this test can be used) for the duration of the COVID-19 declaration under Section 564(b)(1) of the Act, 21 U.S.C. section 360bbb-3(b)(1), unless  the authorization is terminated or revoked sooner. Performed at [facility], [place] [address] [place], [place] [address]   Blood culture (routine x 2)     Status: None   Collection Time: 12/30/18  1:20 PM   Specimen: BLOOD RIGHT FOREARM  Result Value Ref Range Status   Specimen Description   Final    BLOOD RIGHT FOREARM Performed at [facility] [address], [place], [place] [address]    Special Requests   Final    BOTTLES DRAWN AEROBIC AND ANAEROBIC Blood Culture adequate volume Performed at [facility] [address] [place], [place] [address]    Culture   Final    NO GROWTH 5 DAYS Performed at [facility], [place] [address], [place], [place] [address]    Report Status 01/04/2019 FINAL  Final  MRSA PCR Screening     Status: None   Collection Time: 12/31/18  8:45 AM   Specimen: Nasal Mucosa; Nasopharyngeal  Result Value Ref Range Status   MRSA by PCR NEGATIVE NEGATIVE Final    Comment:        The GeneXpert MRSA Assay (FDA approved for NASAL specimens only), is one component of a comprehensive MRSA colonization surveillance program. It is not intended to diagnose MRSA infection nor to guide or monitor treatment for MRSA infections. Performed at [facility], [place] [address] [place], [place] [address]        Radiology Studies: Dg Chest Port 1 View  Result Date: 01/03/2019 CLINICAL DATA:  Dyspnea, pneumonia EXAM: PORTABLE CHEST 1 VIEW COMPARISON:  09/07/2017 chest radiograph. FINDINGS: Enteric tube enters stomach with the tip not seen on this image. Stable cardiomediastinal silhouette with normal heart size. No pneumothorax. No pleural effusion. Mild hazy right costophrenic angle opacity. No pulmonary edema. IMPRESSION: 1. Well-positioned enteric tube. 2. Mild hazy right costophrenic angle opacity, cannot exclude pneumonia. Chest radiograph follow-up advised. Electronically Signed   By: Ilona Sorrel M.D.   On: 01/03/2019 11:26   Dg Abd Portable 1v  Result Date: 01/04/2019 CLINICAL DATA:  Small-bowel obstruction. EXAM: PORTABLE ABDOMEN - 1 VIEW COMPARISON:  01/02/2019.  01/01/2019.  CT 12/30/2018 FINDINGS: Surgical clips right upper quadrant. NG tube noted with tip over the stomach. Persistent distended loops of small bowel noted. Oral contrast has continued to transit through the colon. No free air. Vascular calcification. Degenerative changes scoliosis thoracic spine IMPRESSION: 1.  NG tube noted with tip in the stomach. 2. Persistent distended loops of small bowel noted. Oral contrast is continue to transit through the colon. No free air. Electronically Signed   By: Marcello Moores  Register   On: 01/04/2019 07:39      Scheduled Meds: . diatrizoate meglumine-sodium  90 mL Per NG tube Once  . enoxaparin (LOVENOX) injection  40 mg Subcutaneous Q24H   Continuous Infusions: . 0.9 % NaCl with KCl 40 mEq / L 50 mL/hr at 01/04/19 0600  . piperacillin-tazobactam (ZOSYN)  IV 12.5 mL/hr at 01/04/19 0600     LOS: 5 days    Time spent: 25 minutes   Dessa Phi, DO [facility] [email or web address] 01/04/2019, 1:23 PM

## 2019-01-04 NOTE — Progress Notes (Addendum)
Subjective: CC: SBO Patient denies any N/V. Some burping/belching. Notes she is passing flatus. Had a loose/soft Bm this morning. Abdominal pain is "better". Notes some soreness of the upper abdomen. Did not mobilize in halls yesterday. Sitting up in chair this morning.   Objective: Vital signs in last 24 hours: Temp:  [98.4 F (36.9 C)-100.2 F (37.9 C)] 98.4 F (36.9 C) (06/22 0522) Pulse Rate:  [78-98] 78 (06/22 0522) Resp:  [18] 18 (06/22 0522) BP: (157-178)/(72-89) 157/72 (06/22 0522) SpO2:  [93 %-98 %] 95 % (06/22 0522) Last BM Date: 01/03/19  Intake/Output from previous day: 06/21 0701 - 06/22 0700 In: 1134.3 [P.O.:60; I.V.:1002.4; IV Piggyback:72] Out: 2350 [Urine:1000; Emesis/NG output:1350] Intake/Output this shift: No intake/output data recorded.  PE: Gen: Awake and alert, NAD Lungs; Normal rate and effort Abd: Soft, mild distension, tenderness of the upper abdomen without r/r/g. Neg murphy's sign. +BS. Prior laparoscopic and laparotomy scars noted. NG tube in place on LIWS. 1350cc out/24 hours. Bilious output noted in cannister.  Msk: no edema   Lab Results:  Recent Labs    01/04/19 0318  WBC 8.1  HGB 12.2  HCT 38.7  PLT 286   BMET Recent Labs    01/03/19 0327 01/04/19 0318  NA 137 139  K 3.2* 3.4*  CL 103 106  CO2 22 18*  GLUCOSE 91 77  BUN 10 13  CREATININE 0.61 0.64  CALCIUM 8.3* 8.4*   PT/INR No results for input(s): LABPROT, INR in the last 72 hours. CMP     Component Value Date/Time   NA 139 01/04/2019 0318   NA 132 (A) 09/18/2016   K 3.4 (L) 01/04/2019 0318   CL 106 01/04/2019 0318   CO2 18 (L) 01/04/2019 0318   GLUCOSE 77 01/04/2019 0318   BUN 13 01/04/2019 0318   BUN 17 09/18/2016   CREATININE 0.64 01/04/2019 0318   CALCIUM 8.4 (L) 01/04/2019 0318   PROT 8.2 (H) 12/30/2018 1022   ALBUMIN 4.8 12/30/2018 1022   AST 32 12/30/2018 1022   ALT 21 12/30/2018 1022   ALKPHOS 70 12/30/2018 1022   BILITOT 0.7 12/30/2018 1022    GFRNONAA >60 01/04/2019 0318   GFRAA >60 01/04/2019 0318   Lipase     Component Value Date/Time   LIPASE 37 12/30/2018 1022       Studies/Results: Dg Chest Port 1 View  Result Date: 01/03/2019 CLINICAL DATA:  Dyspnea, pneumonia EXAM: PORTABLE CHEST 1 VIEW COMPARISON:  09/07/2017 chest radiograph. FINDINGS: Enteric tube enters stomach with the tip not seen on this image. Stable cardiomediastinal silhouette with normal heart size. No pneumothorax. No pleural effusion. Mild hazy right costophrenic angle opacity. No pulmonary edema. IMPRESSION: 1. Well-positioned enteric tube. 2. Mild hazy right costophrenic angle opacity, cannot exclude pneumonia. Chest radiograph follow-up advised. Electronically Signed   By: Ilona Sorrel M.D.   On: 01/03/2019 11:26   Dg Abd Portable 1v  Result Date: 01/04/2019 CLINICAL DATA:  Small-bowel obstruction. EXAM: PORTABLE ABDOMEN - 1 VIEW COMPARISON:  01/02/2019.  01/01/2019.  CT 12/30/2018 FINDINGS: Surgical clips right upper quadrant. NG tube noted with tip over the stomach. Persistent distended loops of small bowel noted. Oral contrast has continued to transit through the colon. No free air. Vascular calcification. Degenerative changes scoliosis thoracic spine IMPRESSION: 1.  NG tube noted with tip in the stomach. 2. Persistent distended loops of small bowel noted. Oral contrast is continue to transit through the colon. No free air. Electronically Signed  By: [place]   On: 01/04/2019 07:39    Anti-infectives: Anti-infectives (From admission, onward)   Start     Dose/Rate Route Frequency Ordered Stop   01/03/19 1400  piperacillin-tazobactam (ZOSYN) IVPB 3.375 g     3.375 g 12.5 mL/hr over 240 Minutes Intravenous Every 8 hours 01/03/19 1031     01/01/19 1400  Ampicillin-Sulbactam (UNASYN) 3 g in sodium chloride 0.9 % 100 mL IVPB  Status:  Discontinued     3 g 200 mL/hr over 30 Minutes Intravenous Every 6 hours 01/01/19 0952 01/03/19 1031    12/31/18 1600  vancomycin (VANCOCIN) IVPB 750 mg/150 ml premix  Status:  Discontinued     750 mg 150 mL/hr over 60 Minutes Intravenous Every 24 hours 12/30/18 1457 12/31/18 1037   12/31/18 0200  ceFEPIme (MAXIPIME) 2 g in sodium chloride 0.9 % 100 mL IVPB  Status:  Discontinued     2 g 200 mL/hr over 30 Minutes Intravenous Every 12 hours 12/30/18 1457 01/01/19 0947   12/30/18 1245  ceFEPIme (MAXIPIME) 1 g in sodium chloride 0.9 % 100 mL IVPB     1 g 200 mL/hr over 30 Minutes Intravenous  Once 12/30/18 1236 12/30/18 1424   12/30/18 1245  vancomycin (VANCOCIN) 1,250 mg in sodium chloride 0.9 % 250 mL IVPB     1,250 mg 166.7 mL/hr over 90 Minutes Intravenous STAT 12/30/18 1244 12/30/18 1710       Assessment/Plan GERD Anemia   SBO - Prior surgical history of appendectomy, cholecystectomy following a ERCP, abdominal hysterectomy, exploratory laparotomy (following abdominal hysterectomy for what sounds like a SBO) and laparoscopic lysis of adhesions - CTAP 6/17 w/ distal small bowel obstruction with transition point over the midline mid to lower abdomen involving the ileum with associated whirling of the mesentery. No concern for ischemia based on exam and labs (see progress notes) - Contrast in colon on 6/19. Did not tolerate clamping trial -  Having bowel function. No N/V. Still with high NG output. Xray with dilated small bowel but contrast in colon - Will do clamping trial today and allow sips of clears from the floor. Check 4-6 hour residual. Will recheck later today. - We discussed if she does not improve, may need surgery sometime this week.  - Keep K >4.0 and Mg >2.0 for bowel function - Mobilize for bowel function  FEN - Sips from floor. K 3.4. Mg 2.3 VTE - SCD, Lovenox, Mobilize  ID - Currently Zosyn 6/21 >> WBC 8,100    LOS: 5 days    Jillyn Ledger , [facility] Surgery 01/04/2019, 8:51 AM Pager: [phone]

## 2019-01-04 NOTE — Care Management Important Message (Signed)
Important Message  Patient Details IM Letter given to Kathrin Greathouse SW to present to the Patient Name: LASHYA PASSE MRN: [id number] Date of Birth: 08-17-1945   Medicare Important Message Given:  Yes     Kerin Salen 01/04/2019, 12:11 PM

## 2019-01-04 NOTE — Progress Notes (Signed)
NG placed to suction; no drainage returned. NG irrigated with 127mls of air; still no drainage returned. Donne Hazel, RN

## 2019-01-04 NOTE — Progress Notes (Deleted)
Patient attempted to ambulate in hall but became extremely nauseated and stated "I think I am going to throw up". Patient returned to room/bed. NG reconnected to LIWS and did not return any further drainage. Donne Hazel, RN

## 2019-01-05 LAB — CBC
HCT: 36.1 % (ref 36.0–46.0)
Hemoglobin: 12 g/dL (ref 12.0–15.0)
MCH: 32.2 pg (ref 26.0–34.0)
MCHC: 33.2 g/dL (ref 30.0–36.0)
MCV: 96.8 fL (ref 80.0–100.0)
Platelets: 295 10*3/uL (ref 150–400)
RBC: 3.73 MIL/uL — ABNORMAL LOW (ref 3.87–5.11)
RDW: 12.1 % (ref 11.5–15.5)
WBC: 8.5 10*3/uL (ref 4.0–10.5)
nRBC: 0 % (ref 0.0–0.2)

## 2019-01-05 LAB — BASIC METABOLIC PANEL
Anion gap: 12 (ref 5–15)
BUN: 8 mg/dL (ref 8–23)
CO2: 20 mmol/L — ABNORMAL LOW (ref 22–32)
Calcium: 8.3 mg/dL — ABNORMAL LOW (ref 8.9–10.3)
Chloride: 105 mmol/L (ref 98–111)
Creatinine, Ser: 0.54 mg/dL (ref 0.44–1.00)
GFR calc Af Amer: >60 mL/min (ref >60)
GFR calc non Af Amer: >60 mL/min (ref >60)
Glucose, Bld: 109 mg/dL — ABNORMAL HIGH (ref 70–99)
Potassium: 3.4 mmol/L — ABNORMAL LOW (ref 3.5–5.1)
Sodium: 137 mmol/L (ref 135–145)

## 2019-01-05 MED ORDER — SIMETHICONE 80 MG PO CHEW: 80.0000 mg | CHEWABLE_TABLET | Freq: Four times a day (QID) | ORAL | Status: DC | PRN

## 2019-01-05 MED ORDER — OXYCODONE HCL 5 MG PO TABS: 5.0000 mg | ORAL_TABLET | ORAL | Status: DC | PRN

## 2019-01-05 MED ORDER — PANTOPRAZOLE SODIUM 40 MG PO TBEC
40.0000 mg | DELAYED_RELEASE_TABLET | Freq: Every day | ORAL | Status: DC
  Administered 2019-01-05 – 2019-01-10 (×6): 40 mg via ORAL
  Filled 2019-01-05 (×6): qty 1

## 2019-01-05 MED ORDER — DOCUSATE SODIUM 100 MG PO CAPS
100.0000 mg | ORAL_CAPSULE | Freq: Two times a day (BID) | ORAL | Status: DC
  Administered 2019-01-05 – 2019-01-10 (×8): 100 mg via ORAL
  Filled 2019-01-05 (×9): qty 1

## 2019-01-05 MED ORDER — POTASSIUM CHLORIDE 10 MEQ/100ML IV SOLN
10.0000 meq | INTRAVENOUS | Status: AC
  Administered 2019-01-05 (×2): 10 meq via INTRAVENOUS
  Filled 2019-01-05: qty 100

## 2019-01-05 MED ORDER — ACETAMINOPHEN 325 MG PO TABS
650.0000 mg | ORAL_TABLET | Freq: Four times a day (QID) | ORAL | Status: DC | PRN
  Administered 2019-01-07: 650 mg via ORAL
  Filled 2019-01-05: qty 2

## 2019-01-05 MED ORDER — ONDANSETRON 4 MG PO TBDP
4.0000 mg | ORAL_TABLET | Freq: Three times a day (TID) | ORAL | Status: DC | PRN
  Administered 2019-01-10: 4 mg via ORAL
  Filled 2019-01-05: qty 1

## 2019-01-05 NOTE — Progress Notes (Signed)
PROGRESS NOTE    Ashley Howard  QPR:[id number] DOB: [birth year] DOA: 12/30/2018 PCP: Lynnell Catalan, FNP     Brief Narrative:  Ashley Howard is a 73 y.o. female with medical history significant for multiple abdominal surgeries, scoliosis, osteoporosis, compression fracture, tobacco abuse, previous SBO status post explorative laparotomy who presents from assisted living facility the emergency department for the evaluation of nausea, vomiting, abdominal pain.  She was apparently all right until yesterday evening.  After eating her dinner, she started having generalized abdominal pain that was worse in the epigastric region.  She describes the pain as cramping, waxing.  Her abdomen got progressively distended and she developed nausea and vomiting.  She reports multiple episodes of vomiting since then.  She reports that she had a small bowel movement in the assisted living facility.  Since then she has not passed any flatus or has a bowel movement.  Patient also reported of having some fullness in her chest. CT abdomen/pelvis done in the emergency department showed distal small bowel obstruction with transition point over the midline.  It also showed bilateral patchy infiltrates suggesting multifocal pneumonia.  COVID-19 negative. She has been treated with supportive care with NGT. General surgery following.   New events last 24 hours / Subjective: No new issues overnight.  Has been tolerating NG tube clamp without any further nausea or vomiting.  Having bowel movements.  Assessment & Plan:   Principal Problem:   Small bowel obstruction (HCC) Active Problems:   SBO (small bowel obstruction) (HCC)   Hyponatremia   T12 compression fracture (HCC)   HCAP (healthcare-associated pneumonia)   SBO -Appreciate general surgery following -Small bowel obstruction protocol, supportive care, IV fluid -Repeat abdominal x-ray reveals persistent distended loops of small bowel, oral contrast continue to  transit through the colon -NG tube clamped, to be removed today  Multifocal pneumonia -Could be aspiration in setting of multiple episodes of vomiting -Remains on room air without respiratory distress -MRSA PCR negative.  Stop vancomycin. -Due to fever on 6/21, antibiotic was broadened to Zosyn -CXR 6/21 with mild hazy right costophrenic angle opacity  T12 compression fracture -CT revealed moderate T12 compression fracture, age indeterminant.  Pain control  Hypokalemia -Replace, trend    DVT prophylaxis: Lovenox Code Status: Full Family Communication: Spoke with son over the phone Disposition Plan: Pending improvement in small bowel obstruction   Consultants:   General surgery  Procedures:   None  Antimicrobials:  Anti-infectives (From admission, onward)   Start     Dose/Rate Route Frequency Ordered Stop   01/03/19 1400  piperacillin-tazobactam (ZOSYN) IVPB 3.375 g     3.375 g 12.5 mL/hr over 240 Minutes Intravenous Every 8 hours 01/03/19 1031     01/01/19 1400  Ampicillin-Sulbactam (UNASYN) 3 g in sodium chloride 0.9 % 100 mL IVPB  Status:  Discontinued     3 g 200 mL/hr over 30 Minutes Intravenous Every 6 hours 01/01/19 0952 01/03/19 1031   12/31/18 1600  vancomycin (VANCOCIN) IVPB 750 mg/150 ml premix  Status:  Discontinued     750 mg 150 mL/hr over 60 Minutes Intravenous Every 24 hours 12/30/18 1457 12/31/18 1037   12/31/18 0200  ceFEPIme (MAXIPIME) 2 g in sodium chloride 0.9 % 100 mL IVPB  Status:  Discontinued     2 g 200 mL/hr over 30 Minutes Intravenous Every 12 hours 12/30/18 1457 01/01/19 0947   12/30/18 1245  ceFEPIme (MAXIPIME) 1 g in sodium chloride 0.9 % 100 mL IVPB  1 g 200 mL/hr over 30 Minutes Intravenous  Once 12/30/18 1236 12/30/18 1424   12/30/18 1245  vancomycin (VANCOCIN) 1,250 mg in sodium chloride 0.9 % 250 mL IVPB     1,250 mg 166.7 mL/hr over 90 Minutes Intravenous STAT 12/30/18 1244 12/30/18 1710       Objective: Vitals:    01/04/19 0522 01/04/19 1329 01/04/19 2041 01/05/19 0453  BP: (!) 157/72 (!) 170/73 (!) 148/77 (!) 154/81  Pulse: 78 73 80 73  Resp: 18 18 20 18   Temp: 98.4 F (36.9 C) 98.6 F (37 C) 98.5 F (36.9 C) 98.7 F (37.1 C)  TempSrc: Oral Oral Oral Oral  SpO2: 95% 97% 96% 96%  Weight:      Height:        Intake/Output Summary (Last 24 hours) at 01/05/2019 0957 Last data filed at 01/05/2019 0745 Gross per 24 hour  Intake 1683.47 ml  Output 1500 ml  Net 183.47 ml   Filed Weights   12/30/18 1242  Weight: (S) 64.9 kg    Examination: General exam: Appears calm and comfortable  Respiratory system: Clear to auscultation. Respiratory effort normal. Cardiovascular system: S1 & S2 heard, RRR. No JVD, murmurs, rubs, gallops or clicks. No pedal edema. Gastrointestinal system: Abdomen is nondistended, soft and nontender. No organomegaly or masses felt. Normal bowel sounds heard.  NG-tube clamped Central nervous system: Alert and oriented. No focal neurological deficits. Extremities: Symmetric 5 x 5 power. Skin: No rashes, lesions or ulcers Psychiatry: Judgement and insight appear normal. Mood & affect appropriate.    Data Reviewed: I have personally reviewed following labs and imaging studies  CBC: Recent Labs  Lab 12/30/18 1022 12/31/18 0343 01/04/19 0318 01/05/19 0445  WBC 9.4 9.0 8.1 8.5  NEUTROABS 8.5*  --   --   --   HGB 14.2 12.3 12.2 12.0  HCT 40.3 37.0 38.7 36.1  MCV 94.2 96.6 100.8* 96.8  PLT 284 236 286 384   Basic Metabolic Panel: Recent Labs  Lab 01/01/19 0344 01/02/19 0416 01/03/19 0327 01/04/19 0318 01/05/19 0445  NA 137 135 137 139 137  K 3.4* 3.3* 3.2* 3.4* 3.4*  CL 107 102 103 106 105  CO2 22 24 22  18* 20*  GLUCOSE 107* 101* 91 77 109*  BUN 14 8 10 13 8   CREATININE 0.67 0.65 0.61 0.64 0.54  CALCIUM 7.9* 8.3* 8.3* 8.4* 8.3*  MG  --  2.3 2.2 2.3  --    GFR: Estimated Creatinine Clearance: 50.5 mL/min (by C-G formula based on SCr of 0.54 mg/dL). Liver  Function Tests: Recent Labs  Lab 12/30/18 1022  AST 32  ALT 21  ALKPHOS 70  BILITOT 0.7  PROT 8.2*  ALBUMIN 4.8   Recent Labs  Lab 12/30/18 1022  LIPASE 37   No results for input(s): AMMONIA in the last 168 hours. Coagulation Profile: No results for input(s): INR, PROTIME in the last 168 hours. Cardiac Enzymes: No results for input(s): CKTOTAL, CKMB, CKMBINDEX, TROPONINI in the last 168 hours. BNP (last 3 results) No results for input(s): PROBNP in the last 8760 hours. HbA1C: No results for input(s): HGBA1C in the last 72 hours. CBG: No results for input(s): GLUCAP in the last 168 hours. Lipid Profile: No results for input(s): CHOL, HDL, LDLCALC, TRIG, CHOLHDL, LDLDIRECT in the last 72 hours. Thyroid Function Tests: No results for input(s): TSH, T4TOTAL, FREET4, T3FREE, THYROIDAB in the last 72 hours. Anemia Panel: No results for input(s): VITAMINB12, FOLATE, FERRITIN, TIBC, IRON, RETICCTPCT in  the last 72 hours. Sepsis Labs: Recent Labs  Lab 12/30/18 1236  LATICACIDVEN 1.7    Recent Results (from the past 240 hour(s))  Blood culture (routine x 2)     Status: None   Collection Time: 12/30/18 12:41 PM   Specimen: BLOOD  Result Value Ref Range Status   Specimen Description   Final    BLOOD LEFT WRIST Performed at [facility], [place] [address] [place], [place] [address]    Special Requests   Final    BOTTLES DRAWN AEROBIC ONLY Blood Culture results may not be optimal due to an inadequate volume of blood received in culture bottles Performed at [place] [address] [place], [place] [address]    Culture   Final    NO GROWTH 5 DAYS Performed at [facility], [place] [address] [place], [place] [address]    Report Status 01/04/2019 FINAL  Final  SARS Coronavirus 2 (CEPHEID- Performed in [place] hospital lab), Hosp Order     Status: None   Collection Time: 12/30/18  1:01 PM   Specimen: Nasopharyngeal Swab   Result Value Ref Range Status   SARS Coronavirus 2 NEGATIVE NEGATIVE Final    Comment: (NOTE) If result is NEGATIVE SARS-CoV-2 target nucleic acids are NOT DETECTED. The SARS-CoV-2 RNA is generally detectable in upper and lower  respiratory specimens during the acute phase of infection. The lowest  concentration of SARS-CoV-2 viral copies this assay can detect is 250  copies / mL. A negative result does not preclude SARS-CoV-2 infection  and should not be used as the sole basis for treatment or other  patient management decisions.  A negative result may occur with  improper specimen collection / handling, submission of specimen other  than nasopharyngeal swab, presence of viral mutation(s) within the  areas targeted by this assay, and inadequate number of viral copies  (<250 copies / mL). A negative result must be combined with clinical  observations, patient history, and epidemiological information. If result is POSITIVE SARS-CoV-2 target nucleic acids are DETECTED. The SARS-CoV-2 RNA is generally detectable in upper and lower  respiratory specimens dur ing the acute phase of infection.  Positive  results are indicative of active infection with SARS-CoV-2.  Clinical  correlation with patient history and other diagnostic information is  necessary to determine patient infection status.  Positive results do  not rule out bacterial infection or co-infection with other viruses. If result is PRESUMPTIVE POSTIVE SARS-CoV-2 nucleic acids MAY BE PRESENT.   A presumptive positive result was obtained on the submitted specimen  and confirmed on repeat testing.  While 2019 novel coronavirus  (SARS-CoV-2) nucleic acids may be present in the submitted sample  additional confirmatory testing may be necessary for epidemiological  and / or clinical management purposes  to differentiate between  SARS-CoV-2 and other Sarbecovirus currently known to infect humans.  If clinically indicated additional  testing with an alternate test  methodology [id number]) is advised. The SARS-CoV-2 RNA is generally  detectable in upper and lower respiratory sp ecimens during the acute  phase of infection. The expected result is Negative. Fact Sheet for Patients:  StrictlyIdeas.no Fact Sheet for Healthcare Providers: BankingDealers.co.za This test is not yet approved or cleared by the [place] FDA and has been authorized for detection and/or diagnosis of SARS-CoV-2 by FDA under an Emergency Use Authorization (EUA).  This EUA will remain in effect (meaning this test can be used) for the duration of the  COVID-19 declaration under Section 564(b)(1) of the Act, 21 U.S.C. section 360bbb-3(b)(1), unless the authorization is terminated or revoked sooner. Performed at [facility] [place] [address] [place], [place] [address]   Blood culture (routine x 2)     Status: None   Collection Time: 12/30/18  1:20 PM   Specimen: BLOOD RIGHT FOREARM  Result Value Ref Range Status   Specimen Description   Final    BLOOD RIGHT FOREARM Performed at [place] [address] [place], [place] [address]    Special Requests   Final    BOTTLES DRAWN AEROBIC AND ANAEROBIC Blood Culture adequate volume Performed at [place] [address] [place], [place] [address]    Culture   Final    NO GROWTH 5 DAYS Performed at [facility], [place] [address] [place], [place] [address]    Report Status 01/04/2019 FINAL  Final  MRSA PCR Screening     Status: None   Collection Time: 12/31/18  8:45 AM   Specimen: Nasal Mucosa; Nasopharyngeal  Result Value Ref Range Status   MRSA by PCR NEGATIVE NEGATIVE Final    Comment:        The GeneXpert MRSA Assay (FDA approved for NASAL specimens only), is one component of a comprehensive MRSA colonization surveillance program. It is not intended to diagnose  MRSA infection nor to guide or monitor treatment for MRSA infections. Performed at [facility], [place] [address] [place], [place] [address]        Radiology Studies: Dg Abd Portable 1v  Result Date: 01/04/2019 CLINICAL DATA:  Small-bowel obstruction. EXAM: PORTABLE ABDOMEN - 1 VIEW COMPARISON:  01/02/2019.  01/01/2019.  CT 12/30/2018 FINDINGS: Surgical clips right upper quadrant. NG tube noted with tip over the stomach. Persistent distended loops of small bowel noted. Oral contrast has continued to transit through the colon. No free air. Vascular calcification. Degenerative changes scoliosis thoracic spine IMPRESSION: 1.  NG tube noted with tip in the stomach. 2. Persistent distended loops of small bowel noted. Oral contrast is continue to transit through the colon. No free air. Electronically Signed   By: Marcello Moores  Register   On: 01/04/2019 07:39      Scheduled Meds: . diatrizoate meglumine-sodium  90 mL Per NG tube Once  . docusate sodium  100 mg Oral BID  . enoxaparin (LOVENOX) injection  40 mg Subcutaneous Q24H  . pantoprazole  40 mg Oral Daily   Continuous Infusions: . 0.9 % NaCl with KCl 40 mEq / L 50 mL/hr at 01/04/19 0600  . piperacillin-tazobactam (ZOSYN)  IV 3.375 g (01/05/19 2751)  . potassium chloride 10 mEq (01/05/19 0941)     LOS: 6 days    Time spent: 25 minutes   Dessa Phi, DO [facility] [email or web address] 01/05/2019, 9:57 AM

## 2019-01-05 NOTE — Progress Notes (Signed)
Subjective: CC: SBO Nursing note reviewed. Patient with no drainage returned after 5.5 hours of clamping yesterday. 350cc output in last 24 hours. Notes that she tolerated drinking sips of clears from the floor without increased abdominal pain or any nausea. Passing flatus. Having soft, BM's yesterday as well as the day before. Abdominal pain improved from yesterday. "Just feels sore". Did not take pain medication yesterday.   Objective: Vital signs in last 24 hours: Temp:  [98.5 F (36.9 C)-98.7 F (37.1 C)] 98.7 F (37.1 C) (06/23 0453) Pulse Rate:  [73-80] 73 (06/23 0453) Resp:  [18-20] 18 (06/23 0453) BP: (148-170)/(73-81) 154/81 (06/23 0453) SpO2:  [96 %-97 %] 96 % (06/23 0453) Last BM Date: 01/04/19  Intake/Output from previous day: 06/22 0701 - 06/23 0700 In: 1563.5 [P.O.:240; I.V.:1188.9; IV Piggyback:134.5] Out: 1500 [Urine:1150; Emesis/NG output:350] Intake/Output this shift: Total I/O In: 120 [P.O.:120] Out: -   PE: Gen: Awake and alert, NAD Lungs; Normal rate and effort Abd: Soft, stable to improved distension, mild tenderness of the epigsatrium without r/r/g. +BS. Prior laparoscopic and laparotomy scars noted. NG tube in place on LIWS. 350cc out/24 hours.  Msk: no edema   Lab Results:  Recent Labs    01/04/19 0318 01/05/19 0445  WBC 8.1 8.5  HGB 12.2 12.0  HCT 38.7 36.1  PLT 286 295   BMET Recent Labs    01/04/19 0318 01/05/19 0445  NA 139 137  K 3.4* 3.4*  CL 106 105  CO2 18* 20*  GLUCOSE 77 109*  BUN 13 8  CREATININE 0.64 0.54  CALCIUM 8.4* 8.3*   PT/INR No results for input(s): LABPROT, INR in the last 72 hours. CMP     Component Value Date/Time   NA 137 01/05/2019 0445   NA 132 (A) 09/18/2016   K 3.4 (L) 01/05/2019 0445   CL 105 01/05/2019 0445   CO2 20 (L) 01/05/2019 0445   GLUCOSE 109 (H) 01/05/2019 0445   BUN 8 01/05/2019 0445   BUN 17 09/18/2016   CREATININE 0.54 01/05/2019 0445   CALCIUM 8.3 (L) 01/05/2019 0445   PROT 8.2 (H) 12/30/2018 1022   ALBUMIN 4.8 12/30/2018 1022   AST 32 12/30/2018 1022   ALT 21 12/30/2018 1022   ALKPHOS 70 12/30/2018 1022   BILITOT 0.7 12/30/2018 1022   GFRNONAA >60 01/05/2019 0445   GFRAA >60 01/05/2019 0445   Lipase     Component Value Date/Time   LIPASE 37 12/30/2018 1022       Studies/Results: Dg Abd Portable 1v  Result Date: 01/04/2019 CLINICAL DATA:  Small-bowel obstruction. EXAM: PORTABLE ABDOMEN - 1 VIEW COMPARISON:  01/02/2019.  01/01/2019.  CT 12/30/2018 FINDINGS: Surgical clips right upper quadrant. NG tube noted with tip over the stomach. Persistent distended loops of small bowel noted. Oral contrast has continued to transit through the colon. No free air. Vascular calcification. Degenerative changes scoliosis thoracic spine IMPRESSION: 1.  NG tube noted with tip in the stomach. 2. Persistent distended loops of small bowel noted. Oral contrast is continue to transit through the colon. No free air. Electronically Signed   By: Marcello Moores  Register   On: 01/04/2019 07:39    Anti-infectives: Anti-infectives (From admission, onward)   Start     Dose/Rate Route Frequency Ordered Stop   01/03/19 1400  piperacillin-tazobactam (ZOSYN) IVPB 3.375 g     3.375 g 12.5 mL/hr over 240 Minutes Intravenous Every 8 hours 01/03/19 1031     01/01/19 1400  Ampicillin-Sulbactam (UNASYN) 3 g in sodium chloride 0.9 % 100 mL IVPB  Status:  Discontinued     3 g 200 mL/hr over 30 Minutes Intravenous Every 6 hours 01/01/19 0952 01/03/19 1031   12/31/18 1600  vancomycin (VANCOCIN) IVPB 750 mg/150 ml premix  Status:  Discontinued     750 mg 150 mL/hr over 60 Minutes Intravenous Every 24 hours 12/30/18 1457 12/31/18 1037   12/31/18 0200  ceFEPIme (MAXIPIME) 2 g in sodium chloride 0.9 % 100 mL IVPB  Status:  Discontinued     2 g 200 mL/hr over 30 Minutes Intravenous Every 12 hours 12/30/18 1457 01/01/19 0947   12/30/18 1245  ceFEPIme (MAXIPIME) 1 g in sodium chloride 0.9 % 100 mL  IVPB     1 g 200 mL/hr over 30 Minutes Intravenous  Once 12/30/18 1236 12/30/18 1424   12/30/18 1245  vancomycin (VANCOCIN) 1,250 mg in sodium chloride 0.9 % 250 mL IVPB     1,250 mg 166.7 mL/hr over 90 Minutes Intravenous STAT 12/30/18 1244 12/30/18 1710       Assessment/Plan GERD Anemia  SBO - Priorsurgical history of appendectomy, cholecystectomy following a ERCP, abdominal hysterectomy, exploratory laparotomy(following abdominal hysterectomy for what sounds like a SBO)andlaparoscopic lysis of adhesions - CTAP 6/17 w/ distal small bowel obstruction withtransition point over the midline mid to lower abdomen involving the ileum with associatedwhirling of the mesentery.No concern for ischemia based on exam and labs (see progress notes) -  Contrast in colon on 6/19. Now passing flatus, having soft BM's. Tolerating clamping trial yesterday with no residual after 5.5 hours. Tolerating sips of clears. NGT output down from 1350cc/24 hours yesterday to 350cc in the last 24 hours. Will remove NG tube and start on CLD. Oral medications ordered.  - Keep K >4.0 and Mg >2.0 for bowel function - Mobilize for bowel function  FEN - CLD K 3.4 (being replaced) VTE - SCD, Lovenox, Mobilize  ID - Currently Zosyn 6/21 >> WBC 8,100    LOS: 6 days    Jillyn Ledger , [facility] 01/05/2019, 9:05 AM Pager: [phone]

## 2019-01-06 DIAGNOSIS — E876 Hypokalemia: Secondary | ICD-10-CM

## 2019-01-06 DIAGNOSIS — J189 Pneumonia, unspecified organism: Secondary | ICD-10-CM

## 2019-01-06 DIAGNOSIS — S22080S Wedge compression fracture of T11-T12 vertebra, sequela: Secondary | ICD-10-CM

## 2019-01-06 DIAGNOSIS — K56609 Unspecified intestinal obstruction, unspecified as to partial versus complete obstruction: Principal | ICD-10-CM

## 2019-01-06 LAB — BASIC METABOLIC PANEL
Anion gap: 7 (ref 5–15)
BUN: <5 mg/dL — ABNORMAL LOW (ref 8–23)
CO2: 23 mmol/L (ref 22–32)
Calcium: 8.4 mg/dL — ABNORMAL LOW (ref 8.9–10.3)
Chloride: 104 mmol/L (ref 98–111)
Creatinine, Ser: 0.66 mg/dL (ref 0.44–1.00)
GFR calc Af Amer: >60 mL/min (ref >60)
GFR calc non Af Amer: >60 mL/min (ref >60)
Glucose, Bld: 100 mg/dL — ABNORMAL HIGH (ref 70–99)
Potassium: 3.8 mmol/L (ref 3.5–5.1)
Sodium: 134 mmol/L — ABNORMAL LOW (ref 135–145)

## 2019-01-06 LAB — CBC
HCT: 35 % — ABNORMAL LOW (ref 36.0–46.0)
Hemoglobin: 11.7 g/dL — ABNORMAL LOW (ref 12.0–15.0)
MCH: 33 pg (ref 26.0–34.0)
MCHC: 33.4 g/dL (ref 30.0–36.0)
MCV: 98.6 fL (ref 80.0–100.0)
Platelets: 276 K/uL (ref 150–400)
RBC: 3.55 MIL/uL — ABNORMAL LOW (ref 3.87–5.11)
RDW: 12 % (ref 11.5–15.5)
WBC: 7.5 K/uL (ref 4.0–10.5)
nRBC: 0 % (ref 0.0–0.2)

## 2019-01-06 LAB — MAGNESIUM: Magnesium: 1.7 mg/dL (ref 1.7–2.4)

## 2019-01-06 MED ORDER — MAGNESIUM SULFATE 2 GM/50ML IV SOLN
2.0000 g | Freq: Once | INTRAVENOUS | Status: AC
  Administered 2019-01-06: 15:00:00 2 g via INTRAVENOUS
  Filled 2019-01-06: qty 50

## 2019-01-06 NOTE — Progress Notes (Signed)
PROGRESS NOTE    Ashley Howard  ZDG:[id number] DOB: Mar 15, 1946 DOA: 12/30/2018 PCP: Lynnell Catalan, FNP   Brief Narrative:  Ashley Hotard McDanielis a 73 y.o.femalewith medical history significantformultiple abdominal surgeries, scoliosis, osteoporosis, compression fracture, tobacco abuse, previous SBO status post explorative laparotomy who presents fromassistedliving facility the emergency department for the evaluation of nausea, vomiting, abdominal pain. She was apparently all right until yesterday evening. After eating her dinner, she started having generalized abdominal pain that was worse in the epigastric region. She describes the pain as cramping, waxing. Her abdomen got progressively distended and she developed nausea and vomiting. She reports multiple episodes of vomiting since then. She reports that she had a small bowel movement in the assisted living facility. Since then she has not passed any flatus or has a bowel movement. Patient also reported of having some fullness in her chest. CT abdomen/pelvis done in the emergency department showed distal small bowel obstruction with transition point over the midline.It also showed bilateral patchy infiltrates suggesting multifocal pneumonia. COVID-19 negative. She has been treated with supportive care with NGT. General surgery following.    Assessment & Plan:   Principal Problem:   Small bowel obstruction (HCC) Active Problems:   SBO (small bowel obstruction) (HCC)   Hyponatremia   T12 compression fracture (HCC)   HCAP (healthcare-associated pneumonia)   SBO -Appreciate general surgery following -Small bowel obstruction protocol, supportive care, IV fluid -Repeat abdominal x-ray reveals persistent distended loops of small bowel, oral contrast continue to transit through the colon -NG tube clamped/removed 6/23 -advancing to FLD today  Multifocal pneumonia -Could be aspiration in setting of multiple episodes of  vomiting -Remains on room air without respiratory distress -MRSA PCR negative.  -Due to fever on 6/21, antibiotic was broadened to Zosyn -CXR 6/21 with mild hazy right costophrenic angle opacity -cont to follow clinically  T12 compression fracture -CT revealed moderate T12 compression fracture, age indeterminant.  Pain control  Hypokalemia -Replace, trend   DVT prophylaxis: Lovenox SQ  Code Status: full    Code Status Orders  (From admission, onward)         Start     Ordered   12/30/18 1405  Full code  Continuous     12/30/18 1405        Code Status History    Date Active Date Inactive Code Status Order ID Comments User Context   08/21/2016 1711 08/29/2016 1745 Full Code [id number]  Waldemar Dickens, MD ED   07/16/2016 2240 07/24/2016 2139 Full Code [id number]  Armandina Gemma, MD ED   11/07/2011 1047 11/11/2011 1650 Full Code [id number]  Grandville Silos, RN Inpatient   Advance Care Planning Activity     Family Communication: [phone] Disposition Plan:   Patient remained inpatient for continued subspecialty evaluation, electrolyte monitoring, frequent nurse intervention.  Small bowel obstruction clears anticipate discharge in 1 to 2 days. Consults called:  gen surg Admission status: Inpatient   Consultants:   gen surg  Procedures:  Ct Abdomen Pelvis W Contrast  Result Date: 12/30/2018 CLINICAL DATA:  Epigastric pain and constipation.  Vomiting. EXAM: CT ABDOMEN AND PELVIS WITH CONTRAST TECHNIQUE: Multidetector CT imaging of the abdomen and pelvis was performed using the standard protocol following bolus administration of intravenous contrast. CONTRAST:  192mL OMNIPAQUE IOHEXOL 300 MG/ML  SOLN COMPARISON:  08/21/2016 FINDINGS: Lower chest: Lung bases demonstrate patchy airspace process right worse than left likely due to multifocal pneumonia. No effusion. Fluid-filled dilated distal esophagus with fluid-filled moderate size  hiatal hernia. Moderate narrowing of the stomach  as it passes through the diaphragmatic hiatus. Hepatobiliary: Prior cholecystectomy. Liver is within normal. Biliary tree is unremarkable. Pancreas: Normal. Spleen: Normal. Adrenals/Urinary Tract: Adrenal glands are normal. Kidneys are normal in size without focal mass or hydronephrosis. Subcentimeter right renal cortical hypodensity too small to characterize but likely a cyst. Ureters and bladder are normal. Stomach/Bowel: Moderate-sized hiatal hernia with narrowing of the hernia as passes through the diaphragmatic hiatus. Multiple fluid-filled dilated small bowel loops with the ileum measuring up to 5.4 cm in diameter. There is an abrupt transition point of the ileum in the midline of the mid to lower abdomen just below the level of the aortic bifurcation with associated whirling of the midline mesentery. Remainder of the distal ileum is decompressed. Small amount of air and stool throughout the colon which is otherwise mildly decompressed. Moderate diverticulosis of the descending and sigmoid colon. Appendix not visualized. Vascular/Lymphatic: Moderate calcified plaque over the abdominal aorta and iliac arteries. No adenopathy. Reproductive: Prior hysterectomy. Other: No free peritoneal air and no significant free fluid. Musculoskeletal: Degenerate changes spine with the level disc disease over the lumbar spine. Stable grade 1 anterolisthesis of L3 on L4 and L4 on L5. Mild stable compression deformities of L2 and L4. Moderate T12 compression fracture age indeterminate but new since the previous exam. IMPRESSION: 1. Distal small bowel obstruction with abrupt transition point over the midline mid to lower abdomen involving the ileum with associated whirling of the mesentery. Small bowel measures up to 5.4 cm in diameter. Obstruction likely due to adhesions. 2. Patchy airspace process over the lung bases right worse than left compatible with multifocal pneumonia. 3. Moderate size hiatal hernia containing fluid with  fluid over the distal esophagus and marked narrowing of the hernia as it passes through the diaphragmatic hiatus. Possible degree of obstruction. 4. Moderate T12 compression fracture age indeterminate but new since the prior exam. Stable mild compression deformities of L2 and L4. 5. Colonic diverticulosis. Subcentimeter right renal cortical hypodensity too small to characterize but likely a cyst. Other postsurgical changes as described. 6.  Aortic Atherosclerosis (ICD10-I70.0). Electronically Signed   By: Marin Olp M.D.   On: 12/30/2018 12:25   Dg Chest Port 1 View  Result Date: 01/03/2019 CLINICAL DATA:  Dyspnea, pneumonia EXAM: PORTABLE CHEST 1 VIEW COMPARISON:  09/07/2017 chest radiograph. FINDINGS: Enteric tube enters stomach with the tip not seen on this image. Stable cardiomediastinal silhouette with normal heart size. No pneumothorax. No pleural effusion. Mild hazy right costophrenic angle opacity. No pulmonary edema. IMPRESSION: 1. Well-positioned enteric tube. 2. Mild hazy right costophrenic angle opacity, cannot exclude pneumonia. Chest radiograph follow-up advised. Electronically Signed   By: Ilona Sorrel M.D.   On: 01/03/2019 11:26   Dg Abd Portable 1v  Result Date: 01/04/2019 CLINICAL DATA:  Small-bowel obstruction. EXAM: PORTABLE ABDOMEN - 1 VIEW COMPARISON:  01/02/2019.  01/01/2019.  CT 12/30/2018 FINDINGS: Surgical clips right upper quadrant. NG tube noted with tip over the stomach. Persistent distended loops of small bowel noted. Oral contrast has continued to transit through the colon. No free air. Vascular calcification. Degenerative changes scoliosis thoracic spine IMPRESSION: 1.  NG tube noted with tip in the stomach. 2. Persistent distended loops of small bowel noted. Oral contrast is continue to transit through the colon. No free air. Electronically Signed   By: [place]   On: 01/04/2019 07:39   Dg Abd Portable 1v  Result Date: 01/02/2019 CLINICAL DATA:  Small bowel  obstruction EXAM: PORTABLE ABDOMEN - 1 VIEW COMPARISON:  Abdominal radiograph from 1 day prior FINDINGS: Enteric tube terminates in the distal stomach. Cholecystectomy clips are seen in the right upper quadrant of the abdomen. Stable mildly dilated small bowel loops in the left upper abdomen. Retained oral contrast throughout the colon transiting to the rectum. No evidence of pneumatosis or pneumoperitoneum. IMPRESSION: 1. Enteric tube tip is in the distal stomach. 2. Stable mildly dilated small bowel loops in the left upper abdomen. Retained oral contrast throughout the colon transiting to the rectum. Findings suggest resolving small bowel obstruction. Electronically Signed   By: Ilona Sorrel M.D.   On: 01/02/2019 08:48   Dg Abd Portable 1v  Result Date: 01/01/2019 CLINICAL DATA:  Small-bowel obstruction. EXAM: PORTABLE ABDOMEN - 1 VIEW COMPARISON:  12/31/2018. FINDINGS: NG tube noted coiled stomach. Improvement of small-bowel distention. Oral contrast throughout the colon on today's exam. Degenerative changes scoliosis lumbar spine. Stable upper lumbar compression fracture. Degenerative change both hips. IMPRESSION: NG tube noted coiled stomach. Improvement of small-bowel distention. Oral contrast noted throughout the colon on today's exam. No free air. Electronically Signed   By: Marcello Moores  Register   On: 01/01/2019 07:18   Dg Abd Portable 1v-small Bowel Obstruction Protocol-initial, 8 Hr Delay  Result Date: 12/31/2018 CLINICAL DATA:  Small bowel protocol 8 hour delay EXAM: PORTABLE ABDOMEN - 1 VIEW COMPARISON:  Six 03/02/2019, 12/30/2018, CT 12/30/2018 FINDINGS: Esophageal tube tip overlies the proximal duodenum. Little change in multiple loops of dilated small bowel, with prominent right lower quadrant bowel loop measuring up to 7.5 cm. Some contrast in the right upper quadrant is presumably within the hepatic flexure of the colon. IMPRESSION: 1. Little interval change in multiple loops of dilated small  consistent with a bowel obstruction. A small amount of contrast is visible in the right colon. Electronically Signed   By: Donavan Foil M.D.   On: 12/31/2018 22:35   Dg Abd Portable 1v  Result Date: 12/30/2018 CLINICAL DATA:  NG tube repositioned EXAM: PORTABLE ABDOMEN - 1 VIEW COMPARISON:  None. FINDINGS: NG tube again noted coiling in the lower chest, likely within the hiatal hernia. IMPRESSION: NG tube coils in the lower chest, likely in the hiatal hernia. Electronically Signed   By: Rolm Baptise M.D.   On: 12/30/2018 20:02   Dg Abd Portable 1v-small Bowel Protocol-position Verification  Result Date: 12/30/2018 CLINICAL DATA:  Nasogastric tube placement EXAM: PORTABLE ABDOMEN - 1 VIEW COMPARISON:  Portable exam 1630 hours compared to earlier CT of 12/30/2018 FINDINGS: Tip of nasogastric tube projects over stomach though the proximal side-port projects over the distal esophagus, recommend advancing tube 7-8 cm. Gaseous distention of bowel loops in the mid abdomen and pelvis. No definite bowel wall thickening. Excreted contrast material within renal collecting systems and bladder. Bones demineralized with dextroconvex thoracolumbar scoliosis and scattered degenerative changes. IMPRESSION: Recommend advancing nasogastric tube 7-8 cm. Electronically Signed   By: Lavonia Dana M.D.   On: 12/30/2018 17:05   Dg Addison Bailey G Tube Plc W/fl W/rad  Result Date: 12/31/2018 CLINICAL DATA:  Requires NG tube placement. EXAM: NASO G TUBE PLACEMENT WITH FL AND WITH RAD CONTRAST:  25mL OMNIPAQUE IOHEXOL 300 MG/ML  SOLN FLUOROSCOPY TIME:  Fluoroscopy Time:  48 seconds Radiation Exposure Index (if provided by the fluoroscopic device): 11.9 mGy Number of Acquired Spot Images: 0 COMPARISON:  None. FINDINGS: The indwelling nasogastric tube was manipulated under direct fluoroscopic guidance a into the proximal duodenum just beyond the pylorus period.  Injection of water-soluble contrast material confirms placement of tube tip within  the proximal duodenum. IMPRESSION: 1. Successful post pyloric placement of indwelling nasogastric tube. Electronically Signed   By: Kerby Moors M.D.   On: 12/31/2018 13:37     Antimicrobials:   Zosyn day 4   Subjective: Patient improving slowly, No acute events overnight, tolerating clear liquid diets advancing to full's today  Objective: Vitals:   01/05/19 1357 01/05/19 2103 01/06/19 0518 01/06/19 1354  BP: (!) 153/74 (!) 143/70 130/76 132/69  Pulse: 73 70 75 78  Resp: 18 20 16 18   Temp: 98 F (36.7 C) 99.3 F (37.4 C) 99.5 F (37.5 C) 99.7 F (37.6 C)  TempSrc: Oral Oral Oral Oral  SpO2: 99% 97% 98% 100%  Weight:      Height:        Intake/Output Summary (Last 24 hours) at 01/06/2019 1401 Last data filed at 01/06/2019 1351 Gross per 24 hour  Intake 1874.65 ml  Output 950 ml  Net 924.65 ml   Filed Weights   12/30/18 1242  Weight: (S) 64.9 kg    Examination:  General exam: Appears calm and comfortable  Respiratory system: Clear to auscultation. Respiratory effort normal. Cardiovascular system: S1 & S2 heard, RRR. No JVD, murmurs, rubs, gallops or clicks. No pedal edema. Gastrointestinal system: Abdomen is nondistended, soft and nontender. No organomegaly or masses felt. Normal bowel sounds heard. Central nervous system: Alert and oriented. No focal neurological deficits. Extremities: Warm well perfused, no contractures Skin: No rashes, lesions or ulcers Psychiatry: Judgement and insight appear normal. Mood & affect appropriate.     Data Reviewed: I have personally reviewed following labs and imaging studies  CBC: Recent Labs  Lab 12/31/18 0343 01/04/19 0318 01/05/19 0445 01/06/19 0342  WBC 9.0 8.1 8.5 7.5  HGB 12.3 12.2 12.0 11.7*  HCT 37.0 38.7 36.1 35.0*  MCV 96.6 100.8* 96.8 98.6  PLT 236 286 295 809   Basic Metabolic Panel: Recent Labs  Lab 01/02/19 0416 01/03/19 0327 01/04/19 0318 01/05/19 0445 01/06/19 0342  NA 135 137 139 137 134*   K 3.3* 3.2* 3.4* 3.4* 3.8  CL 102 103 106 105 104  CO2 24 22 18* 20* 23  GLUCOSE 101* 91 77 109* 100*  BUN 8 10 13 8  <5*  CREATININE 0.65 0.61 0.64 0.54 0.66  CALCIUM 8.3* 8.3* 8.4* 8.3* 8.4*  MG 2.3 2.2 2.3  --  1.7   GFR: Estimated Creatinine Clearance: 50.5 mL/min (by C-G formula based on SCr of 0.66 mg/dL). Liver Function Tests: No results for input(s): AST, ALT, ALKPHOS, BILITOT, PROT, ALBUMIN in the last 168 hours. No results for input(s): LIPASE, AMYLASE in the last 168 hours. No results for input(s): AMMONIA in the last 168 hours. Coagulation Profile: No results for input(s): INR, PROTIME in the last 168 hours. Cardiac Enzymes: No results for input(s): CKTOTAL, CKMB, CKMBINDEX, TROPONINI in the last 168 hours. BNP (last 3 results) No results for input(s): PROBNP in the last 8760 hours. HbA1C: No results for input(s): HGBA1C in the last 72 hours. CBG: No results for input(s): GLUCAP in the last 168 hours. Lipid Profile: No results for input(s): CHOL, HDL, LDLCALC, TRIG, CHOLHDL, LDLDIRECT in the last 72 hours. Thyroid Function Tests: No results for input(s): TSH, T4TOTAL, FREET4, T3FREE, THYROIDAB in the last 72 hours. Anemia Panel: No results for input(s): VITAMINB12, FOLATE, FERRITIN, TIBC, IRON, RETICCTPCT in the last 72 hours. Sepsis Labs: No results for input(s): PROCALCITON, LATICACIDVEN in the last  168 hours.  Recent Results (from the past 240 hour(s))  Blood culture (routine x 2)     Status: None   Collection Time: 12/30/18 12:41 PM   Specimen: BLOOD  Result Value Ref Range Status   Specimen Description   Final    BLOOD LEFT WRIST Performed at [facility] [address] [place], [place] [address]    Special Requests   Final    BOTTLES DRAWN AEROBIC ONLY Blood Culture results may not be optimal due to an inadequate volume of blood received in culture bottles Performed at [facility] [address]  [place], [place] [address]    Culture   Final    NO GROWTH 5 DAYS Performed at [facility], [place] [address], [place], [place] [address]    Report Status 01/04/2019 FINAL  Final  SARS Coronavirus 2 (CEPHEID- Performed in [facility] hospital lab), Hosp Order     Status: None   Collection Time: 12/30/18  1:01 PM   Specimen: Nasopharyngeal Swab  Result Value Ref Range Status   SARS Coronavirus 2 NEGATIVE NEGATIVE Final    Comment: (NOTE) If result is NEGATIVE SARS-CoV-2 target nucleic acids are NOT DETECTED. The SARS-CoV-2 RNA is generally detectable in upper and lower  respiratory specimens during the acute phase of infection. The lowest  concentration of SARS-CoV-2 viral copies this assay can detect is 250  copies / mL. A negative result does not preclude SARS-CoV-2 infection  and should not be used as the sole basis for treatment or other  patient management decisions.  A negative result may occur with  improper specimen collection / handling, submission of specimen other  than nasopharyngeal swab, presence of viral mutation(s) within the  areas targeted by this assay, and inadequate number of viral copies  (<250 copies / mL). A negative result must be combined with clinical  observations, patient history, and epidemiological information. If result is POSITIVE SARS-CoV-2 target nucleic acids are DETECTED. The SARS-CoV-2 RNA is generally detectable in upper and lower  respiratory specimens dur ing the acute phase of infection.  Positive  results are indicative of active infection with SARS-CoV-2.  Clinical  correlation with patient history and other diagnostic information is  necessary to determine patient infection status.  Positive results do  not rule out bacterial infection or co-infection with other viruses. If result is PRESUMPTIVE POSTIVE SARS-CoV-2 nucleic acids MAY BE PRESENT.   A presumptive positive result was obtained on the submitted specimen  and confirmed on repeat  testing.  While 2019 novel coronavirus  (SARS-CoV-2) nucleic acids may be present in the submitted sample  additional confirmatory testing may be necessary for epidemiological  and / or clinical management purposes  to differentiate between  SARS-CoV-2 and other Sarbecovirus currently known to infect humans.  If clinically indicated additional testing with an alternate test  methodology [phone]) is advised. The SARS-CoV-2 RNA is generally  detectable in upper and lower respiratory sp ecimens during the acute  phase of infection. The expected result is Negative. Fact Sheet for Patients:  StrictlyIdeas.no Fact Sheet for Healthcare Providers: BankingDealers.co.za This test is not yet approved or cleared by the [place] FDA and has been authorized for detection and/or diagnosis of SARS-CoV-2 by FDA under an Emergency Use Authorization (EUA).  This EUA will remain in effect (meaning this test can be used) for the duration of the COVID-19 declaration under Section 564(b)(1) of the Act, 21 U.S.C. section 360bbb-3(b)(1), unless the authorization  is terminated or revoked sooner. Performed at [facility], [place] [address], [place], [place] [address]   Blood culture (routine x 2)     Status: None   Collection Time: 12/30/18  1:20 PM   Specimen: BLOOD RIGHT FOREARM  Result Value Ref Range Status   Specimen Description   Final    BLOOD RIGHT FOREARM Performed at [facility] [address] [place], [place] [address]    Special Requests   Final    BOTTLES DRAWN AEROBIC AND ANAEROBIC Blood Culture adequate volume Performed at [facility] [address], [place], [place] [address]    Culture   Final    NO GROWTH 5 DAYS Performed at [facility], [place] [address], [place], [place] [address]    Report Status 01/04/2019 FINAL  Final  MRSA PCR Screening     Status: None    Collection Time: 12/31/18  8:45 AM   Specimen: Nasal Mucosa; Nasopharyngeal  Result Value Ref Range Status   MRSA by PCR NEGATIVE NEGATIVE Final    Comment:        The GeneXpert MRSA Assay (FDA approved for NASAL specimens only), is one component of a comprehensive MRSA colonization surveillance program. It is not intended to diagnose MRSA infection nor to guide or monitor treatment for MRSA infections. Performed at [facility], [place] [address], [place], [place] [address]          Radiology Studies: No results found.      Scheduled Meds: . diatrizoate meglumine-sodium  90 mL Per NG tube Once  . docusate sodium  100 mg Oral BID  . enoxaparin (LOVENOX) injection  40 mg Subcutaneous Q24H  . pantoprazole  40 mg Oral Daily   Continuous Infusions: . 0.9 % NaCl with KCl 40 mEq / L 50 mL/hr at 01/06/19 1000  . piperacillin-tazobactam (ZOSYN)  IV 3.375 g (01/06/19 1351)     LOS: 7 days    Time spent: 72 min    Nicolette Bang, MD [facility]  If 7PM-7AM, please contact night-coverage  01/06/2019, 2:01 PM

## 2019-01-06 NOTE — TOC Initial Note (Signed)
Transition of Care [facility]) - Initial/Assessment Note    Patient Details  Name: Ashley Howard MRN: [id number] Date of Birth: [birth year]  Transition of Care [facility]) CM/SW Contact:    Nila Nephew, LCSW Phone Number: [phone] 01/06/2019, 10:35 AM  Clinical Narrative:    Pt admitted with SBO, from [facility] [place]. At baseline there ambulates with her walker. Spoke with Santiago Glad, resident care director who is aware of pt's status in hospital. Aware HHPT/OT being recommended for pt upon DC and she advises ALF is contracted with [facility] to provide therapy, please include orders on FL2 at DC.  Pt was on regular diet at facility but if soft diet needed can provide with order for "pureed diet" on DC summary and Fl2. Will assist with transition back to [facility] at DC.               Expected Discharge Plan: Assisted Living Barriers to Discharge: Continued Medical Work up   Patient Goals and CMS Choice Patient states their goals for this hospitalization and ongoing recovery are:: get better CMS Medicare.gov Compare Post Acute Care list provided to:: (facility contracts with [facility]) Choice offered to / list presented to : NA  Expected Discharge Plan and Services Expected Discharge Plan: Assisted Living In-house Referral: Clinical Social Work Discharge Planning Services: CM Consult Post Acute Care Choice: [place] arrangements for the past 2 months: [place]                                      Prior Living Arrangements/Services Living arrangements for the past 2 months: [place] Lives with:: Facility Resident Patient language and need for interpreter reviewed:: No Do you feel safe going back to the place where you live?: Yes      Need for Family Participation in Patient Care: No (Comment) Care giver support system in place?: Yes (comment)(ALF resident) Current home services: DME Criminal Activity/Legal  Involvement Pertinent to Current Situation/Hospitalization: No - Comment as needed  Activities of Daily Living Home Assistive Devices/Equipment: Walker (specify type), Blood pressure cuff, Eyeglasses, Scales(front wheeled walker-[facility] has necessary equipment for their residents) ADL Screening (condition at time of admission) Patient's cognitive ability adequate to safely complete daily activities?: Yes Is the patient deaf or have difficulty hearing?: No Does the patient have difficulty seeing, even when wearing glasses/contacts?: No Does the patient have difficulty concentrating, remembering, or making decisions?: No Patient able to express need for assistance with ADLs?: Yes Does the patient have difficulty dressing or bathing?: Yes Independently performs ADLs?: No Communication: Independent Dressing (OT): Needs assistance Is this a change from baseline?: Pre-admission baseline Grooming: Needs assistance Is this a change from baseline?: Pre-admission baseline Feeding: Needs assistance Is this a change from baseline?: Pre-admission baseline Bathing: Needs assistance Is this a change from baseline?: Pre-admission baseline Toileting: Needs assistance Is this a change from baseline?: Pre-admission baseline In/Out Bed: Needs assistance Is this a change from baseline?: Pre-admission baseline Walks in Home: Needs assistance Is this a change from baseline?: Pre-admission baseline Does the patient have difficulty walking or climbing stairs?: Yes Weakness of Legs: Both Weakness of Arms/Hands: None  Permission Sought/Granted Permission sought to share information with : Customer service manager    Share Information with NAME: [facility]- director Santiago Glad           Emotional Assessment Appearance:: Appears  stated age            Admission diagnosis:  Hyponatremia [E87.1] Small bowel obstruction ([place]) [J18.841] Encounter for imaging study to confirm  nasogastric (NG) tube placement [Z01.89] Multifocal pneumonia [J18.9] Patient Active Problem List   Diagnosis Date Noted  . Hyponatremia 12/30/2018  . T12 compression fracture ([place]) 12/30/2018  . HCAP (healthcare-associated pneumonia) 12/30/2018  . Anemia   . Rectal bleeding   . SBO (small bowel obstruction) ([place]) 08/21/2016  . Choledocholithiasis 08/21/2016  . Urinary incontinence 08/21/2016  . GERD (gastroesophageal reflux disease) 08/21/2016  . Neuropathic pain 08/21/2016  . Hyperglycemia 08/21/2016  . History of aspiration pneumonia 07/30/2016  . Septic shock ([place])   . Protein-calorie malnutrition, severe 07/20/2016  . Small bowel obstruction ([place]) 07/16/2016  . Dehydration 07/16/2016  . Abnormality of gait 01/04/2013   PCP:  Lynnell Catalan, [place] [facility]:   [place], [place] - [address] [place] [address] Phone: [phone] Fax: [phone]     Social Determinants of Health (SDOH) Interventions    Readmission Risk Interventions No flowsheet data found.

## 2019-01-06 NOTE — Progress Notes (Signed)
Occupational Therapy Treatment Patient Details Name: Ashley Howard MRN: [id number] DOB: 1946/01/30 Today's Date: 01/06/2019    History of present illness 73 yo female admitted with SBO. Hx of SBO, multiple abd surgeries, hiatal hernia, comp fx, scoliosis, oteoporosis   OT comments  Pt admitted with SBO. Pt  currently with functional limitations due to the deficits listed below (see OT Problem List).  Pt will benefit from skilled OT to increase their safety and independence with ADL and functional mobility for ADL to facilitate discharge to venue listed below.    Follow Up Recommendations  Home health OT;Supervision/Assistance - 24 hour(HH at ALF)    Equipment Recommendations  None recommended by OT       Precautions / Restrictions Precautions Precautions: Fall       Mobility Bed Mobility Overal bed mobility: Needs Assistance Bed Mobility: Supine to Sit     Supine to sit: Min guard;HOB elevated     General bed mobility comments: close guard for safety, lines.  Transfers Overall transfer level: Needs assistance Equipment used: Rolling walker (2 wheeled) Transfers: Sit to/from [facility] Sit to Stand: Min guard Stand pivot transfers: Min guard       General transfer comment: pt with liquid stool in bed so assisted first to [facility] for hygiene    Balance Overall balance assessment: Mild deficits observed, not formally tested                                         ADL either performed or assessed with clinical judgement   ADL Overall ADL's : Needs assistance/impaired     Grooming: Standing;Minimal assistance                   Toilet Transfer: Min guard;Cueing for sequencing;Stand-pivot;BSC   Toileting- Water quality scientist and Hygiene: Min guard;Sit to/from stand;Cueing for safety;Cueing for sequencing;Cueing for compensatory techniques       Functional mobility during ADLs: Min guard;Rolling walker(functional  transfers in room)       Vision Patient Visual Report: No change from baseline            Cognition Arousal/Alertness: Awake/alert Behavior During Therapy: WFL for tasks assessed/performed Overall Cognitive Status: Within Functional Limits for tasks assessed                                                     Pertinent Vitals/ Pain       Pain Assessment: No/denies pain Pain Intervention(s): Repositioned     Prior Functioning/Environment              Frequency  Min 2X/week        Progress Toward Goals  OT Goals(current goals can now be found in the care plan section)  Progress towards OT goals: Progressing toward goals     Plan Discharge plan remains appropriate       AM-PAC OT "6 Clicks" Daily Activity     Outcome Measure   Help from another person eating meals?: A Little Help from another person taking care of personal grooming?: A Little Help from another person toileting, which includes using toliet, bedpan, or urinal?: A Little Help from another person bathing (including washing, rinsing, drying)?: A Little Help from another  person to put on and taking off regular upper body clothing?: A Little Help from another person to put on and taking off regular lower body clothing?: A Lot 6 Click Score: 17    End of Session Equipment Utilized During Treatment: Rolling walker;Gait belt  OT Visit Diagnosis: Unsteadiness on feet (R26.81);Muscle weakness (generalized) (M62.81)   Activity Tolerance Patient tolerated treatment well   Patient Left in chair;with call bell/phone within reach;with chair alarm set   Nurse Communication Mobility status;Precautions        Time: 1607-3710 OT Time Calculation (min): 16 min  Charges: OT General Charges $OT Visit: 1 Visit OT Treatments $Self Care/Home Management : 8-22 mins  Kari Baars, [place] [phone] Office- [phone], Edwena Felty  D 01/06/2019, 4:29 PM

## 2019-01-06 NOTE — Progress Notes (Signed)
Subjective: CC: SBO Patient reports no abdominal pain, N/V. Tolerated CLD yesterday. Did not mobilize yesterday. Passing flatus. Several loose/watery BMs yesterday  Objective: Vital signs in last 24 hours: Temp:  [98 F (36.7 C)-99.5 F (37.5 C)] 99.5 F (37.5 C) (06/24 0518) Pulse Rate:  [70-75] 75 (06/24 0518) Resp:  [16-20] 16 (06/24 0518) BP: (130-153)/(70-76) 130/76 (06/24 0518) SpO2:  [97 %-99 %] 98 % (06/24 0518) Last BM Date: 01/05/19  Intake/Output from previous day: 06/23 0701 - 06/24 0700 In: 2025.1 [P.O.:720; I.V.:1167.2; IV Piggyback:137.9] Out: 750 [Urine:750] Intake/Output this shift: No intake/output data recorded.  PE: Gen: Awake and alert, NAD Lungs; Normal rate and effort Abd: Soft, stable to improved distension, mild tenderness of the epigsatrium without r/r/g. +BS. Prior laparoscopic and laparotomy scars noted.+BS Msk: no edema  Lab Results:  Recent Labs    01/05/19 0445 01/06/19 0342  WBC 8.5 7.5  HGB 12.0 11.7*  HCT 36.1 35.0*  PLT 295 276   BMET Recent Labs    01/05/19 0445 01/06/19 0342  NA 137 134*  K 3.4* 3.8  CL 105 104  CO2 20* 23  GLUCOSE 109* 100*  BUN 8 <5*  CREATININE 0.54 0.66  CALCIUM 8.3* 8.4*   PT/INR No results for input(s): LABPROT, INR in the last 72 hours. CMP     Component Value Date/Time   NA 134 (L) 01/06/2019 0342   NA 132 (A) 09/18/2016   K 3.8 01/06/2019 0342   CL 104 01/06/2019 0342   CO2 23 01/06/2019 0342   GLUCOSE 100 (H) 01/06/2019 0342   BUN <5 (L) 01/06/2019 0342   BUN 17 09/18/2016   CREATININE 0.66 01/06/2019 0342   CALCIUM 8.4 (L) 01/06/2019 0342   PROT 8.2 (H) 12/30/2018 1022   ALBUMIN 4.8 12/30/2018 1022   AST 32 12/30/2018 1022   ALT 21 12/30/2018 1022   ALKPHOS 70 12/30/2018 1022   BILITOT 0.7 12/30/2018 1022   GFRNONAA >60 01/06/2019 0342   GFRAA >60 01/06/2019 0342   Lipase     Component Value Date/Time   LIPASE 37 12/30/2018 1022       Studies/Results: No  results found.  Anti-infectives: Anti-infectives (From admission, onward)   Start     Dose/Rate Route Frequency Ordered Stop   01/03/19 1400  piperacillin-tazobactam (ZOSYN) IVPB 3.375 g     3.375 g 12.5 mL/hr over 240 Minutes Intravenous Every 8 hours 01/03/19 1031     01/01/19 1400  Ampicillin-Sulbactam (UNASYN) 3 g in sodium chloride 0.9 % 100 mL IVPB  Status:  Discontinued     3 g 200 mL/hr over 30 Minutes Intravenous Every 6 hours 01/01/19 0952 01/03/19 1031   12/31/18 1600  vancomycin (VANCOCIN) IVPB 750 mg/150 ml premix  Status:  Discontinued     750 mg 150 mL/hr over 60 Minutes Intravenous Every 24 hours 12/30/18 1457 12/31/18 1037   12/31/18 0200  ceFEPIme (MAXIPIME) 2 g in sodium chloride 0.9 % 100 mL IVPB  Status:  Discontinued     2 g 200 mL/hr over 30 Minutes Intravenous Every 12 hours 12/30/18 1457 01/01/19 0947   12/30/18 1245  ceFEPIme (MAXIPIME) 1 g in sodium chloride 0.9 % 100 mL IVPB     1 g 200 mL/hr over 30 Minutes Intravenous  Once 12/30/18 1236 12/30/18 1424   12/30/18 1245  vancomycin (VANCOCIN) 1,250 mg in sodium chloride 0.9 % 250 mL IVPB     1,250 mg 166.7 mL/hr over 90  Minutes Intravenous STAT 12/30/18 1244 12/30/18 1710       Assessment/Plan GERD Anemia  SBO - Priorsurgical history of appendectomy, cholecystectomyfollowing a ERCP, abdominal hysterectomy, exploratory laparotomy(following abdominal hysterectomy for what sounds like a SBO)andlaparoscopic lysis of adhesions - CTAP6/17 w/distal small bowel obstruction withtransition point over the midline mid to lower abdomen involving the ileum with associatedwhirling of the mesentery.No concern for ischemia based on exam and labs (see progress notes) - Advance to FLD. Advance as tolerated to Soft diet  - Keep K >4.0 and Mg >2.0 for bowel function - Mobilize for bowel function  FEN -FLT, AAT to soft, K 3.8, Mg 1.7 VTE -SCD, Lovenox, Mobilize ID -Currently Zosyn 6/21 >> WBC 7,500   LOS:  7 days    Jillyn Ledger , [facility] 01/06/2019, 7:54 AM Pager: [phone]

## 2019-01-06 NOTE — Progress Notes (Signed)
Physical Therapy Treatment Patient Details Name: Ashley Howard MRN: [id number] DOB: 07/30/1945 Today's Date: 01/06/2019    History of Present Illness 73 yo female admitted with SBO. Hx of SBO, multiple abd surgeries, hiatal hernia, comp fx, scoliosis, oteoporosis    PT Comments    Pt pivoted to [facility] and assisted with pericare (had BM in bed).  Pt then ambulated in hallway.  Pt progressing well.  Recommend nursing ambulate with pt.  Pt reports only ambulating with PT at this time.   Follow Up Recommendations  Home health PT;Supervision - Intermittent     Equipment Recommendations  None recommended by PT    Recommendations for Other Services       Precautions / Restrictions Precautions Precautions: Fall    Mobility  Bed Mobility Overal bed mobility: Needs Assistance Bed Mobility: Supine to Sit     Supine to sit: Min guard;HOB elevated     General bed mobility comments: close guard for safety, lines.  Transfers Overall transfer level: Needs assistance Equipment used: Rolling walker (2 wheeled) Transfers: Sit to/from [facility] Sit to Stand: Min guard Stand pivot transfers: Min guard       General transfer comment: pt with liquid stool in bed so assisted first to [facility] for hygiene  Ambulation/Gait Ambulation/Gait assistance: Min guard Gait Distance (Feet): 500 Feet Assistive device: Rolling walker (2 wheeled) Gait Pattern/deviations: Step-through pattern;Decreased stride length     General Gait Details: verbal cues for posture and RW positioning   Stairs             Wheelchair Mobility    Modified Rankin (Stroke Patients Only)       Balance                                            Cognition Arousal/Alertness: Awake/alert Behavior During Therapy: WFL for tasks assessed/performed Overall Cognitive Status: Within Functional Limits for tasks assessed                                         Exercises      General Comments        Pertinent Vitals/Pain Pain Assessment: No/denies pain Pain Intervention(s): Repositioned    Home Living                      Prior Function            PT Goals (current goals can now be found in the care plan section) Progress towards PT goals: Progressing toward goals    Frequency    Min 3X/week      PT Plan Current plan remains appropriate    Co-evaluation              AM-PAC PT "6 Clicks" Mobility   Outcome Measure  Help needed turning from your back to your side while in a flat bed without using bedrails?: A Little Help needed moving from lying on your back to sitting on the side of a flat bed without using bedrails?: A Little Help needed moving to and from a bed to a chair (including a wheelchair)?: A Little Help needed standing up from a chair using your arms (e.g., wheelchair or bedside chair)?: A Little Help needed to walk in hospital  room?: A Little Help needed climbing 3-5 steps with a railing? : A Little 6 Click Score: 18    End of Session Equipment Utilized During Treatment: Gait belt Activity Tolerance: Patient tolerated treatment well Patient left: in bed;with call bell/phone within reach   PT Visit Diagnosis: Muscle weakness (generalized) (M62.81);Unsteadiness on feet (R26.81)     Time: 0932-6712 PT Time Calculation (min) (ACUTE ONLY): 29 min  Charges:  $Gait Training: 8-22 mins $Therapeutic Activity: 8-22 mins                    Carmelia Bake, PT, DPT Acute Rehabilitation Services Office: [phone] Pager: [phone]  Trena Platt 01/06/2019, 12:48 PM

## 2019-01-07 MED ORDER — ALUM & MAG HYDROXIDE-SIMETH 200-200-20 MG/5ML PO SUSP: 15.0000 mL | Freq: Four times a day (QID) | ORAL | Status: DC | PRN

## 2019-01-07 NOTE — Discharge Instructions (Signed)
Bowel Obstruction  A bowel obstruction is a blockage in the small or large bowel. The bowel, which is also called the intestine, is a long, slender tube that connects the stomach to the anus. When a person eats and drinks, food and fluids go from the mouth to the stomach to the small bowel. This is where most of the nutrients in the food and fluids are absorbed. After the small bowel, material passes through the large bowel for further absorption until any leftover material leaves the body as stool through the anus during a bowel movement.  A bowel obstruction will prevent food and fluids from passing through the bowel as they normally do during digestion. The bowel can become partially or completely blocked. If this condition is not treated, it can be dangerous because the bowel could rupture.  What are the causes?  Common causes of this condition include:  · Scar tissue (adhesions) from previous surgery or treatment with high-energy X-rays (radiation).  · Recent surgery. This may cause the movements of the bowel to slow down and cause food to block the intestine.  · Inflammatory bowel disease, such asCrohn's disease or diverticulitis.  · Growths or tumors.  · A bulging organ (hernia).  · Twisting of the bowel (volvulus).  · A foreign body.  · Slipping of a part of the bowel into another part (intussusception).  What are the signs or symptoms?  Symptoms of this condition include:  · Pain in the abdomen. Depending on the degree of obstruction, pain may be:  ? Mild or severe.  ? Dull cramping or sharp pain.  ? In one area or in the entire abdomen.  · Nausea and vomiting. Vomit may be greenish or a yellow bile color.  · Bloating in the abdomen.  · Difficulty passing stool (constipation).  · Lack of passing gas.  · Frequent belching.  · Diarrhea. This may occur if the obstruction is partial and runny stool is able to leak around the obstruction.  How is this diagnosed?  This condition may be diagnosed based on:  · A  physical exam.  · Medical history.  · Imaging tests of the abdomen or pelvis, such as X-ray or CT scan.  · Blood or urine tests.  How is this treated?  Treatment for this condition depends on the cause and severity of the problem. Treatment may include:  · Fluids and pain medicines that are given through an IV. Your health care provider may instruct you not to eat or drink if you have nausea or vomiting.  · Eating a simple diet. You may be asked to consume a clear liquid diet for several days. This allows the bowel to rest.  · Placement of a small tube (nasogastric tube) into the stomach. This will relieve pain, discomfort, and nausea by removing blocked air and fluids from the stomach. It can also help the obstruction clear up faster.  · Surgery. This may be required if other treatments do not work. Surgery may be required for:  ? Bowel obstruction from a hernia. This can be an emergency procedure.  ? Scar tissue that causes frequent or severe obstructions.  Follow these instructions at home:  Medicines  · Take over-the-counter and prescription medicines only as told by your health care provider.  · If you were prescribed an antibiotic medicine, take it as told by your health care provider. Do not stop taking the antibiotic even if you start to feel better.  General instructions  ·   what activities are safe for you.  Avoid sitting for a long time without moving. Get up to take short walks every 1-2 hours. This is important to improve blood flow and breathing. Ask for help if you feel weak or unsteady.  Keep all follow-up visits as told by your health care provider. This is important. How is this prevented? After having a bowel  obstruction, you are more likely to have another. You may do the following things to prevent another obstruction:  If you have a long-term (chronic) disease, pay attention to your symptoms and contact your health care provider if you have questions or concerns.  Avoid becoming constipated. To prevent or treat constipation, your health care provider may recommend that you: ? Drink enough fluid to keep your urine pale yellow. ? Take over-the-counter or prescription medicines. ? Eat foods that are high in fiber, such as beans, whole grains, and fresh fruits and vegetables. ? Limit foods that are high in fat and processed sugars, such as fried or sweet foods.  Stay active. Exercise for 30 minutes or more, 5 or more days each week. Ask your health care provider which exercises are safe for you.  Avoid stress. Find ways to reduce stress, such as meditation, exercise, or taking time for activities that relax you.  Instead of eating three large meals each day, eat three small meals with three small snacks.  Work with a Microbiologist to make a healthy meal plan that works for you.  Do not use any products that contain nicotine or tobacco, such as cigarettes and e-cigarettes. If you need help quitting, ask your health care provider. Contact a health care provider if you:  Have a fever.  Have chills. Get help right away if you:  Have increased pain or cramping.  Vomit blood.  Have uncontrolled vomiting or nausea.  Cannot drink fluids because of vomiting or pain.  Become confused.  Begin feeling very thirsty (dehydrated).  Have severe bloating.  Feel extremely weak or you faint. Summary  A bowel obstruction is a blockage in the small or large bowel.  A bowel obstruction will prevent food and fluids from passing through the bowel as they normally do during digestion.  Treatment for this condition depends on the cause and severity of the problem. It may include fluids and pain medicines  through an IV, a simple diet, a nasogastric tube, or surgery.  Follow instructions from your health care provider about eating restrictions. You may need to avoid solid foods and consume only clear liquids until your condition improves. This information is not intended to replace advice given to you by your health care provider. Make sure you discuss any questions you have with your health care provider. Document Released: 09/17/2005 Document Revised: 11/12/2017 Document Reviewed: 11/12/2017 Elsevier Interactive Patient Education  2019 [place] (please follow this for the next 4 weeks) Fiber is found in fruits, vegetables, whole grains, and beans. Eating a diet low in fiber helps to reduce how often you have bowel movements and how much you produce during a bowel movement. A low-fiber eating plan may help your digestive system heal if:  You have certain conditions, such as Crohn's disease or diverticulitis.  You recently had radiation therapy on your pelvis or bowel.  You recently had intestinal surgery.  You have a new surgical opening in your abdomen (colostomy or ileostomy).  Your intestine is narrowed (stricture). Your health care provider will determine how long you need  to stay on this diet. Your health care provider may recommend that you work with a diet and nutrition specialist (dietitian). What are tips for following this plan? General guidelines  Follow recommendations from your dietitian about how much fiber you should have each day.  Most people on this eating plan should try to eat less than 10 grams (g) of fiber each day. Your daily fiber goal is _________________ g.  Take vitamin and mineral supplements as told by your health care provider or dietitian. Chewable or liquid forms are best when on this eating plan. Reading food labels  Check food labels for the amount of dietary fiber.  Choose foods that have less than 2 grams of fiber in  one serving. Cooking  Use white flour and other allowed grains for baking and cooking.  Cook meat using methods that keep it tender, such as braising or poaching.  Cook eggs until the yolk is completely solid.  Cook with healthy oils, such as olive oil or canola oil. Meal planning   Eat 5-6 small meals throughout the day instead of 3 large meals.  If you are lactose intolerant: ? Choose low-lactose dairy foods. ? Do not eat dairy foods, if told by your dietitian.  Limit fat and oils to less than 8 teaspoons a day.  Eat small portions of desserts. What foods are allowed? The items listed below may not be a complete list. Talk with your dietitian about what dietary choices are best for you. Grains All bread and crackers made with white flour. Waffles, pancakes, and [place] toast. Bagels. Pretzels. Melba toast, zwieback, and matzoh. Cooked and dried cereals that do not contain whole grains, added fiber, seeds, or dried fruit. CornmealDomenick Gong. Hot and cold cereals made with refined corn, wheat, rice, or oats. Plain pasta and noodles. White rice. Vegetables Well-cooked or canned vegetables without skin, seeds, or stems. Cooked potatoes without skins. Vegetable juice. Fruits Soft-cooked or canned fruits without skin and seeds. Peeled ripe banana. Applesauce. Fruit juice without pulp. Meats and other protein foods Ground meat. Tender cuts of meat or poultry. Eggs. Fish, seafood, and shellfish. Smooth nut butters. Tofu. Dairy All milk products and drinks. Lactose-free milks, including rice, soy, and almond milks. Yogurt without fruit, nuts, chocolate, or granola mix-ins. Sour cream. Cottage cheese. Cheese. Beverages Decaf coffee. Fruit and vegetable juices or smoothies (in small amounts, with no pulp or skins, and with fruits from allowed list). Sports drinks. Herbal tea. Fats and oils Olive oil, canola oil, sunflower oil, flaxseed oil, and grapeseed oil. Mayonnaise. Cream cheese.  Margarine. Butter. Sweets and desserts Plain cakes and cookies. Cream pies and pies made with allowed fruits. Pudding. Custard. Fruit gelatin. Sherbet. Popsicles. Ice cream without nuts. Plain hard candy. Honey. Jelly. Molasses. Syrups, including chocolate syrup. Chocolate. Marshmallows. Gumdrops. Seasoning and other foods Bouillon. Broth. Cream soups made from allowed foods. Strained soup. Casseroles made with allowed foods. Ketchup. Mild mustard. Mild salad dressings. Plain gravies. Vinegar. Spices in moderation. Salt. Sugar. What foods are not allowed? The items listed below may not be a complete list. Talk with your dietitian about what dietary choices are best for you. Grains Whole wheat and whole grain breads and crackers. Multigrain breads and crackers. Rye bread. Whole grain or multigrain cereals. Cereals with nuts, raisins, or coconut. Bran. Coarse wheat cereals. Granola. High-fiber cereals. Cornmeal or corn bread. Whole grain pasta. Wild or brown rice. Quinoa. Popcorn. Buckwheat. Wheat germ. Vegetables Potato skins. Raw or undercooked vegetables. All beans and bean  sprouts. Cooked greens. Corn. Peas. Cabbage. Beets. Broccoli. Brussels sprouts. Cauliflower. Mushrooms. Onions. Peppers. Parsnips. Okra. Sauerkraut. Fruit Raw or dried fruit. Berries. Fruit juice with pulp. Prune juice. Meats and other protein foods Tough, fibrous meats with gristle. Fatty meat. Poultry with skin. Fried meat, Sales executive, or fish. Deli or lunch meats. Sausage, bacon, and hot dogs. Nuts and chunky nut butter. Dried peas, beans, and lentils. Dairy Yogurt with fruit, nuts, chocolate, or granola mix-ins. Beverages Caffeinated coffee and teas. Fats and oils Avocado. Coconut. Sweets and desserts Desserts, cookies, or candies that contain nuts or coconut. Dried fruit. Jams and preserves with seeds. Marmalade. Any dessert made with fruits or grains that are not allowed. Seasoning and other foods Corn tortilla chips.  Soups made with vegetables or grains that are not allowed. Relish. Horseradish. Angie Fava. Olives. Summary  Most people on a low-fiber eating plan should eat less than 10 grams of fiber a day. Follow recommendations from your dietitian about how much fiber you should have each day.  Always check food labels to see the dietary fiber content of packaged foods. In general, a low-fiber food will have fewer than 2 grams of fiber per serving.  In general, try to avoid whole grains, raw fruits and vegetables, dried fruit, tough cuts of meat, nuts, and seeds.  Take a vitamin and mineral supplement as told by your health care provider or dietitian. This information is not intended to replace advice given to you by your health care provider. Make sure you discuss any questions you have with your health care provider. Document Released: 12/21/2001 Document Revised: 09/03/2016 Document Reviewed: 09/03/2016 Elsevier Interactive Patient Education  2019 [place].   High-Fiber Diet (please start this diet in 4 weeks) Fiber, also called dietary fiber, is a type of carbohydrate that is found in fruits, vegetables, whole grains, and beans. A high-fiber diet can have many health benefits. Your health care provider may recommend a high-fiber diet to help:  Prevent constipation. Fiber can make your bowel movements more regular.  Lower your cholesterol.  Relieve the following conditions: ? Swelling of veins in the anus (hemorrhoids). ? Swelling and irritation (inflammation) of specific areas of the digestive tract (uncomplicated diverticulosis). ? A problem of the large intestine (colon) that sometimes causes pain and diarrhea (irritable bowel syndrome, IBS).  Prevent overeating as part of a weight-loss plan.  Prevent heart disease, type 2 diabetes, and certain cancers. What is my plan? The recommended daily fiber intake in grams (g) includes:  38 g for men age 49 or younger.  30 g for men over age  34.  49 g for women age 56 or younger.  21 g for women over age 80. You can get the recommended daily intake of dietary fiber by:  Eating a variety of fruits, vegetables, grains, and beans.  Taking a fiber supplement, if it is not possible to get enough fiber through your diet. What do I need to know about a high-fiber diet?  It is better to get fiber through food sources rather than from fiber supplements. There is not a lot of research about how effective supplements are.  Always check the fiber content on the nutrition facts label of any prepackaged food. Look for foods that contain 5 g of fiber or more per serving.  Talk with a diet and nutrition specialist (dietitian) if you have questions about specific foods that are recommended or not recommended for your medical condition, especially if those foods are not listed below.  Gradually increase how much fiber you consume. If you increase your intake of dietary fiber too quickly, you may have bloating, cramping, or gas.  Drink plenty of water. Water helps you to digest fiber. What are tips for following this plan?  Eat a wide variety of high-fiber foods.  Make sure that half of the grains that you eat each day are whole grains.  Eat breads and cereals that are made with whole-grain flour instead of refined flour or white flour.  Eat brown rice, bulgur wheat, or millet instead of white rice.  Start the day with a breakfast that is high in fiber, such as a cereal that contains 5 g of fiber or more per serving.  Use beans in place of meat in soups, salads, and pasta dishes.  Eat high-fiber snacks, such as berries, raw vegetables, nuts, and popcorn.  Choose whole fruits and vegetables instead of processed forms like juice or sauce. What foods can I eat?  Fruits Berries. Pears. Apples. Oranges. Avocado. Prunes and raisins. Dried figs. Vegetables Sweet potatoes. Spinach. Kale. Artichokes. Cabbage. Broccoli. Cauliflower. Green  peas. Carrots. Squash. Grains Whole-grain breads. Multigrain cereal. Oats and oatmeal. Brown rice. Barley. Bulgur wheat. [place]. Quinoa. Bran muffins. Popcorn. Rye wafer crackers. Meats and other proteins [place], kidney, and pinto beans. Soybeans. Split peas. Lentils. Nuts and seeds. Dairy Fiber-fortified yogurt. Beverages Fiber-fortified soy milk. Fiber-fortified orange juice. Other foods Fiber bars. The items listed above may not be a complete list of recommended foods and beverages. Contact a dietitian for more options. What foods are not recommended? Fruits Fruit juice. Cooked, strained fruit. Vegetables Fried potatoes. Canned vegetables. Well-cooked vegetables. Grains White bread. Pasta made with refined flour. White rice. Meats and other proteins Fatty cuts of meat. Fried chicken or fried fish. Dairy Milk. Yogurt. Cream cheese. Sour cream. Fats and oils Butters. Beverages Soft drinks. Other foods Cakes and pastries. The items listed above may not be a complete list of foods and beverages to avoid. Contact a dietitian for more information. Summary  Fiber is a type of carbohydrate. It is found in fruits, vegetables, whole grains, and beans.  There are many health benefits of eating a high-fiber diet, such as preventing constipation, lowering blood cholesterol, helping with weight loss, and reducing your risk of heart disease, diabetes, and certain cancers.  Gradually increase your intake of fiber. Increasing too fast can result in cramping, bloating, and gas. Drink plenty of water while you increase your fiber.  The best sources of fiber include whole fruits and vegetables, whole grains, nuts, seeds, and beans. This information is not intended to replace advice given to you by your health care provider. Make sure you discuss any questions you have with your health care provider. Document Released: 07/01/2005 Document Revised: 05/05/2017 Document Reviewed:  05/05/2017 Elsevier Interactive Patient Education  2019 [employer].

## 2019-01-07 NOTE — Final Consult Note (Signed)
Consultant Final Sign-Off Note    Assessment/Final recommendations  Ashley Howard is a 73 y.o. female followed by me for SBO   Wound care (if applicable): None   Diet at discharge: Low residue diet for one month. High fiber diet there after.    Activity at discharge: per primary team   Follow-up appointment:  PRN   Pending results:  Unresulted Labs (From admission, onward)    Start     Ordered   01/06/19 1415  Novel Coronavirus, NAA (hospital order; send-out to ref lab)  Once,   R    Comments: No isolation needed for this testing (if isolation ordered for another indication, maintain current isolation).   Question:  Required for discharge to:  Answer:  SNF/facility placement   01/06/19 1414           Medication recommendations: N/A   Other recommendations: N/A    Thank you for allowing [place] to participate in the care of your patient!  Please consult [place] again if you have further needs for your patient.  Barth Kirks [facility] 01/07/2019 9:08 AM    Subjective   Patient is doing well. Tolerated FLD yesterday. Had cream of potato soup yesterday for dinner. No current nausea. Denies emesis. No abdominal pain. Passing flatus. Working with PT. Soft diet this morning was tolerated (pancakes and sausage).  Objective  Vital signs in last 24 hours: Temp:  [98.8 F (37.1 C)-99.7 F (37.6 C)] 98.8 F (37.1 C) (06/25 0533) Pulse Rate:  [67-78] 67 (06/25 0533) Resp:  [16-19] 19 (06/25 0533) BP: (132-147)/(69-78) 135/78 (06/25 0533) SpO2:  [97 %-100 %] 97 % (06/25 0533)  PE: Gen: Awake and alert, NAD Lungs; Normal rate and effort Abd: Soft,ND,mildtendernessof the LUQ and RUQwithout r/r/g. Negative Murphy's sign. +BS. Prior laparoscopic and laparotomy scars noted. Msk: no edema  Pertinent labs and Studies: Recent Labs    01/05/19 0445 01/06/19 0342  WBC 8.5 7.5  HGB 12.0 11.7*  HCT 36.1 35.0*   BMET Recent Labs    01/05/19 0445 01/06/19 0342  NA 137 134*  K  3.4* 3.8  CL 105 104  CO2 20* 23  GLUCOSE 109* 100*  BUN 8 <5*  CREATININE 0.54 0.66  CALCIUM 8.3* 8.4*   No results for input(s): LABURIN in the last 72 hours. Results for orders placed or performed during the hospital encounter of 12/30/18  Blood culture (routine x 2)     Status: None   Collection Time: 12/30/18 12:41 PM   Specimen: BLOOD  Result Value Ref Range Status   Specimen Description   Final    BLOOD LEFT WRIST Performed at [facility] [address], [place], [place] [address]    Special Requests   Final    BOTTLES DRAWN AEROBIC ONLY Blood Culture results may not be optimal due to an inadequate volume of blood received in culture bottles Performed at [facility] [address] [place], [place] [address]    Culture   Final    NO GROWTH 5 DAYS Performed at [facility], [place] [address], [place], [place] [address]    Report Status 01/04/2019 FINAL  Final  SARS Coronavirus 2 (CEPHEID- Performed in [facility] hospital lab), Hosp Order     Status: None   Collection Time: 12/30/18  1:01 PM   Specimen: Nasopharyngeal Swab  Result Value Ref Range Status   SARS Coronavirus 2 NEGATIVE NEGATIVE Final    Comment: (NOTE) If result  is NEGATIVE SARS-CoV-2 target nucleic acids are NOT DETECTED. The SARS-CoV-2 RNA is generally detectable in upper and lower  respiratory specimens during the acute phase of infection. The lowest  concentration of SARS-CoV-2 viral copies this assay can detect is 250  copies / mL. A negative result does not preclude SARS-CoV-2 infection  and should not be used as the sole basis for treatment or other  patient management decisions.  A negative result may occur with  improper specimen collection / handling, submission of specimen other  than nasopharyngeal swab, presence of viral mutation(s) within the  areas targeted by this assay, and inadequate number of viral copies  (<250 copies / mL). A negative  result must be combined with clinical  observations, patient history, and epidemiological information. If result is POSITIVE SARS-CoV-2 target nucleic acids are DETECTED. The SARS-CoV-2 RNA is generally detectable in upper and lower  respiratory specimens dur ing the acute phase of infection.  Positive  results are indicative of active infection with SARS-CoV-2.  Clinical  correlation with patient history and other diagnostic information is  necessary to determine patient infection status.  Positive results do  not rule out bacterial infection or co-infection with other viruses. If result is PRESUMPTIVE POSTIVE SARS-CoV-2 nucleic acids MAY BE PRESENT.   A presumptive positive result was obtained on the submitted specimen  and confirmed on repeat testing.  While 2019 novel coronavirus  (SARS-CoV-2) nucleic acids may be present in the submitted sample  additional confirmatory testing may be necessary for epidemiological  and / or clinical management purposes  to differentiate between  SARS-CoV-2 and other Sarbecovirus currently known to infect humans.  If clinically indicated additional testing with an alternate test  methodology [phone]) is advised. The SARS-CoV-2 RNA is generally  detectable in upper and lower respiratory sp ecimens during the acute  phase of infection. The expected result is Negative. Fact Sheet for Patients:  StrictlyIdeas.no Fact Sheet for Healthcare Providers: BankingDealers.co.za This test is not yet approved or cleared by the [place] FDA and has been authorized for detection and/or diagnosis of SARS-CoV-2 by FDA under an Emergency Use Authorization (EUA).  This EUA will remain in effect (meaning this test can be used) for the duration of the COVID-19 declaration under Section 564(b)(1) of the Act, 21 U.S.C. section 360bbb-3(b)(1), unless the authorization is terminated or revoked sooner. Performed at [facility], [place] [address] [place], [place] [address]   Blood culture (routine x 2)     Status: None   Collection Time: 12/30/18  1:20 PM   Specimen: BLOOD RIGHT FOREARM  Result Value Ref Range Status   Specimen Description   Final    BLOOD RIGHT FOREARM Performed at [facility] [address] [place], [place] [address]    Special Requests   Final    BOTTLES DRAWN AEROBIC AND ANAEROBIC Blood Culture adequate volume Performed at [facility] [address], [place], [place] [address]    Culture   Final    NO GROWTH 5 DAYS Performed at [facility], [place] [address] [place], [place] [address]    Report Status 01/04/2019 FINAL  Final  MRSA PCR Screening     Status: None   Collection Time: 12/31/18  8:45 AM   Specimen: Nasal Mucosa; Nasopharyngeal  Result Value Ref Range Status   MRSA by PCR NEGATIVE NEGATIVE Final    Comment:        The GeneXpert MRSA Assay (FDA  approved for NASAL specimens only), is one component of a comprehensive MRSA colonization surveillance program. It is not intended to diagnose MRSA infection nor to guide or monitor treatment for MRSA infections. Performed at [facility], [place] [address], [place], [place] [address]     Imaging: No results found.

## 2019-01-07 NOTE — Progress Notes (Signed)
PROGRESS NOTE    Ashley Howard  SNK:[id number] DOB: 05/20/1946 DOA: 12/30/2018 PCP: Lynnell Catalan, FNP   Brief Narrative:  Ashley Howard a 73 y.o.femalewith medical history significantformultiple abdominal surgeries, scoliosis, osteoporosis, compression fracture, tobacco abuse, previous SBO status post explorative laparotomy who presents fromassistedliving facility the emergency department for the evaluation of nausea, vomiting, abdominal pain. She was apparently all right until yesterday evening. After eating her dinner, she started having generalized abdominal pain that was worse in the epigastric region. She describes the pain as cramping, waxing. Her abdomen got progressively distended and she developed nausea and vomiting. She reports multiple episodes of vomiting since then. She reports that she had a small bowel movement in the assisted living facility. Since then she has not passed any flatus or has a bowel movement. Patient also reported of having some fullness in her chest. CT abdomen/pelvis done in the emergency department showed distal small bowel obstruction with transition point over the midline.It also showed bilateral patchy infiltrates suggesting multifocal pneumonia. COVID-19 negative.She has been treated with supportive care with NGT. General surgery following.   Assessment & Plan:   Principal Problem:   Small bowel obstruction (HCC) Active Problems:   SBO (small bowel obstruction) (HCC)   Hyponatremia   T12 compression fracture (HCC)   HCAP (healthcare-associated pneumonia)   SBO -Appreciate general surgery input -Small bowel obstruction resolved  Multifocal pneumonia -Could be aspiration in setting of multiple episodes of vomiting -Remains on room air without respiratory distress -MRSA PCR negative.  -Due to fever on 6/21, antibiotic was broadened to Zosyn -CXR 6/21 with mild hazy right costophrenic angle opacity -cont to follow  clinically-appears to be doing well  T12 compression fracture -CT revealed moderate T12 compression fracture, age indeterminant. Pain control  Hypokalemia -Replace, trend   DVT prophylaxis: Lovenox SQ  Code Status: full    Code Status Orders  (From admission, onward)         Start     Ordered   12/30/18 1405  Full code  Continuous     12/30/18 1405        Code Status History    Date Active Date Inactive Code Status Order ID Comments User Context   08/21/2016 1711 08/29/2016 1745 Full Code [id number]  Waldemar Dickens, MD ED   07/16/2016 2240 07/24/2016 2139 Full Code [id number]  Armandina Gemma, MD ED   11/07/2011 1047 11/11/2011 1650 Full Code [id number]  Grandville Silos, RN Inpatient   Advance Care Planning Activity     Family Communication: discussed with son Disposition Plan:   Patient remained inpatient for continued subspecialty evaluation, electrolyte monitoring, frequent nurse intervention.  Small bowel obstruction clears anticipate discharge in 1 to 2 days.  Consults called: None Admission status: Inpatient   Consultants:   gen surg  Procedures:  Ct Abdomen Pelvis W Contrast  Result Date: 12/30/2018 CLINICAL DATA:  Epigastric pain and constipation.  Vomiting. EXAM: CT ABDOMEN AND PELVIS WITH CONTRAST TECHNIQUE: Multidetector CT imaging of the abdomen and pelvis was performed using the standard protocol following bolus administration of intravenous contrast. CONTRAST:  127mL OMNIPAQUE IOHEXOL 300 MG/ML  SOLN COMPARISON:  08/21/2016 FINDINGS: Lower chest: Lung bases demonstrate patchy airspace process right worse than left likely due to multifocal pneumonia. No effusion. Fluid-filled dilated distal esophagus with fluid-filled moderate size hiatal hernia. Moderate narrowing of the stomach as it passes through the diaphragmatic hiatus. Hepatobiliary: Prior cholecystectomy. Liver is within normal. Biliary tree is unremarkable. Pancreas: Normal. Spleen: Normal. Adrenals/Urinary  Tract: Adrenal glands are normal. Kidneys are normal in size without focal mass or hydronephrosis. Subcentimeter right renal cortical hypodensity too small to characterize but likely a cyst. Ureters and bladder are normal. Stomach/Bowel: Moderate-sized hiatal hernia with narrowing of the hernia as passes through the diaphragmatic hiatus. Multiple fluid-filled dilated small bowel loops with the ileum measuring up to 5.4 cm in diameter. There is an abrupt transition point of the ileum in the midline of the mid to lower abdomen just below the level of the aortic bifurcation with associated whirling of the midline mesentery. Remainder of the distal ileum is decompressed. Small amount of air and stool throughout the colon which is otherwise mildly decompressed. Moderate diverticulosis of the descending and sigmoid colon. Appendix not visualized. Vascular/Lymphatic: Moderate calcified plaque over the abdominal aorta and iliac arteries. No adenopathy. Reproductive: Prior hysterectomy. Other: No free peritoneal air and no significant free fluid. Musculoskeletal: Degenerate changes spine with the level disc disease over the lumbar spine. Stable grade 1 anterolisthesis of L3 on L4 and L4 on L5. Mild stable compression deformities of L2 and L4. Moderate T12 compression fracture age indeterminate but new since the previous exam. IMPRESSION: 1. Distal small bowel obstruction with abrupt transition point over the midline mid to lower abdomen involving the ileum with associated whirling of the mesentery. Small bowel measures up to 5.4 cm in diameter. Obstruction likely due to adhesions. 2. Patchy airspace process over the lung bases right worse than left compatible with multifocal pneumonia. 3. Moderate size hiatal hernia containing fluid with fluid over the distal esophagus and marked narrowing of the hernia as it passes through the diaphragmatic hiatus. Possible degree of obstruction. 4. Moderate T12 compression fracture age  indeterminate but new since the prior exam. Stable mild compression deformities of L2 and L4. 5. Colonic diverticulosis. Subcentimeter right renal cortical hypodensity too small to characterize but likely a cyst. Other postsurgical changes as described. 6.  Aortic Atherosclerosis (ICD10-I70.0). Electronically Signed   By: Marin Olp M.D.   On: 12/30/2018 12:25   Dg Chest Port 1 View  Result Date: 01/03/2019 CLINICAL DATA:  Dyspnea, pneumonia EXAM: PORTABLE CHEST 1 VIEW COMPARISON:  09/07/2017 chest radiograph. FINDINGS: Enteric tube enters stomach with the tip not seen on this image. Stable cardiomediastinal silhouette with normal heart size. No pneumothorax. No pleural effusion. Mild hazy right costophrenic angle opacity. No pulmonary edema. IMPRESSION: 1. Well-positioned enteric tube. 2. Mild hazy right costophrenic angle opacity, cannot exclude pneumonia. Chest radiograph follow-up advised. Electronically Signed   By: Ilona Sorrel M.D.   On: 01/03/2019 11:26   Dg Abd Portable 1v  Result Date: 01/04/2019 CLINICAL DATA:  Small-bowel obstruction. EXAM: PORTABLE ABDOMEN - 1 VIEW COMPARISON:  01/02/2019.  01/01/2019.  CT 12/30/2018 FINDINGS: Surgical clips right upper quadrant. NG tube noted with tip over the stomach. Persistent distended loops of small bowel noted. Oral contrast has continued to transit through the colon. No free air. Vascular calcification. Degenerative changes scoliosis thoracic spine IMPRESSION: 1.  NG tube noted with tip in the stomach. 2. Persistent distended loops of small bowel noted. Oral contrast is continue to transit through the colon. No free air. Electronically Signed   By: Marcello Moores  Register   On: 01/04/2019 07:39   Dg Abd Portable 1v  Result Date: 01/02/2019 CLINICAL DATA:  Small bowel obstruction EXAM: PORTABLE ABDOMEN - 1 VIEW COMPARISON:  Abdominal radiograph from 1 day prior FINDINGS: Enteric tube terminates in the distal stomach. Cholecystectomy clips are seen in the  right  upper quadrant of the abdomen. Stable mildly dilated small bowel loops in the left upper abdomen. Retained oral contrast throughout the colon transiting to the rectum. No evidence of pneumatosis or pneumoperitoneum. IMPRESSION: 1. Enteric tube tip is in the distal stomach. 2. Stable mildly dilated small bowel loops in the left upper abdomen. Retained oral contrast throughout the colon transiting to the rectum. Findings suggest resolving small bowel obstruction. Electronically Signed   By: Ilona Sorrel M.D.   On: 01/02/2019 08:48   Dg Abd Portable 1v  Result Date: 01/01/2019 CLINICAL DATA:  Small-bowel obstruction. EXAM: PORTABLE ABDOMEN - 1 VIEW COMPARISON:  12/31/2018. FINDINGS: NG tube noted coiled stomach. Improvement of small-bowel distention. Oral contrast throughout the colon on today's exam. Degenerative changes scoliosis lumbar spine. Stable upper lumbar compression fracture. Degenerative change both hips. IMPRESSION: NG tube noted coiled stomach. Improvement of small-bowel distention. Oral contrast noted throughout the colon on today's exam. No free air. Electronically Signed   By: Marcello Moores  Register   On: 01/01/2019 07:18   Dg Abd Portable 1v-small Bowel Obstruction Protocol-initial, 8 Hr Delay  Result Date: 12/31/2018 CLINICAL DATA:  Small bowel protocol 8 hour delay EXAM: PORTABLE ABDOMEN - 1 VIEW COMPARISON:  Six 03/02/2019, 12/30/2018, CT 12/30/2018 FINDINGS: Esophageal tube tip overlies the proximal duodenum. Little change in multiple loops of dilated small bowel, with prominent right lower quadrant bowel loop measuring up to 7.5 cm. Some contrast in the right upper quadrant is presumably within the hepatic flexure of the colon. IMPRESSION: 1. Little interval change in multiple loops of dilated small consistent with a bowel obstruction. A small amount of contrast is visible in the right colon. Electronically Signed   By: Donavan Foil M.D.   On: 12/31/2018 22:35   Dg Abd Portable  1v  Result Date: 12/30/2018 CLINICAL DATA:  NG tube repositioned EXAM: PORTABLE ABDOMEN - 1 VIEW COMPARISON:  None. FINDINGS: NG tube again noted coiling in the lower chest, likely within the hiatal hernia. IMPRESSION: NG tube coils in the lower chest, likely in the hiatal hernia. Electronically Signed   By: Rolm Baptise M.D.   On: 12/30/2018 20:02   Dg Abd Portable 1v-small Bowel Protocol-position Verification  Result Date: 12/30/2018 CLINICAL DATA:  Nasogastric tube placement EXAM: PORTABLE ABDOMEN - 1 VIEW COMPARISON:  Portable exam 1630 hours compared to earlier CT of 12/30/2018 FINDINGS: Tip of nasogastric tube projects over stomach though the proximal side-port projects over the distal esophagus, recommend advancing tube 7-8 cm. Gaseous distention of bowel loops in the mid abdomen and pelvis. No definite bowel wall thickening. Excreted contrast material within renal collecting systems and bladder. Bones demineralized with dextroconvex thoracolumbar scoliosis and scattered degenerative changes. IMPRESSION: Recommend advancing nasogastric tube 7-8 cm. Electronically Signed   By: Lavonia Dana M.D.   On: 12/30/2018 17:05   Dg Addison Bailey G Tube Plc W/fl W/rad  Result Date: 12/31/2018 CLINICAL DATA:  Requires NG tube placement. EXAM: NASO G TUBE PLACEMENT WITH FL AND WITH RAD CONTRAST:  32mL OMNIPAQUE IOHEXOL 300 MG/ML  SOLN FLUOROSCOPY TIME:  Fluoroscopy Time:  48 seconds Radiation Exposure Index (if provided by the fluoroscopic device): 11.9 mGy Number of Acquired Spot Images: 0 COMPARISON:  None. FINDINGS: The indwelling nasogastric tube was manipulated under direct fluoroscopic guidance a into the proximal duodenum just beyond the pylorus period. Injection of water-soluble contrast material confirms placement of tube tip within the proximal duodenum. IMPRESSION: 1. Successful post pyloric placement of indwelling nasogastric tube. Electronically Signed   By: Lovena Le  Clovis Riley M.D.   On: 12/31/2018 13:37      Antimicrobials:   Zosyn day 5    Subjective: Patient doing well small bowel obstruction resolved Sitting upright in bed eating lunch Awaiting results of covid testing  Objective: Vitals:   01/06/19 1354 01/06/19 2015 01/07/19 0533 01/07/19 1304  BP: 132/69 (!) 147/71 135/78 117/73  Pulse: 78 71 67 80  Resp: 18 16 19 16   Temp: 99.7 F (37.6 C) 99.3 F (37.4 C) 98.8 F (37.1 C) 98.2 F (36.8 C)  TempSrc: Oral Oral Oral Oral  SpO2: 100% 97% 97% 97%  Weight:      Height:        Intake/Output Summary (Last 24 hours) at 01/07/2019 1356 Last data filed at 01/07/2019 1304 Gross per 24 hour  Intake 3151.92 ml  Output 3050 ml  Net 101.92 ml   Filed Weights   12/30/18 1242  Weight: (S) 64.9 kg    Examination:  General exam: Appears calm and comfortable  Respiratory system: Clear to auscultation. Respiratory effort normal. Cardiovascular system: S1 & S2 heard, RRR. No JVD, murmurs, rubs, gallops or clicks. No pedal edema. Gastrointestinal system: Abdomen is nondistended, soft and nontender. No organomegaly or masses felt. Normal bowel sounds heard. Central nervous system: Alert and oriented. No focal neurological deficits. Extremities: Warm well perfused, no contractures Skin: No rashes, lesions or ulcers Psychiatry: Judgement and insight appear normal. Mood & affect appropriate     Data Reviewed: I have personally reviewed following labs and imaging studies  CBC: Recent Labs  Lab 01/04/19 0318 01/05/19 0445 01/06/19 0342  WBC 8.1 8.5 7.5  HGB 12.2 12.0 11.7*  HCT 38.7 36.1 35.0*  MCV 100.8* 96.8 98.6  PLT 286 295 268   Basic Metabolic Panel: Recent Labs  Lab 01/02/19 0416 01/03/19 0327 01/04/19 0318 01/05/19 0445 01/06/19 0342  NA 135 137 139 137 134*  K 3.3* 3.2* 3.4* 3.4* 3.8  CL 102 103 106 105 104  CO2 24 22 18* 20* 23  GLUCOSE 101* 91 77 109* 100*  BUN 8 10 13 8  <5*  CREATININE 0.65 0.61 0.64 0.54 0.66  CALCIUM 8.3* 8.3* 8.4* 8.3* 8.4*   MG 2.3 2.2 2.3  --  1.7   GFR: Estimated Creatinine Clearance: 50.5 mL/min (by C-G formula based on SCr of 0.66 mg/dL). Liver Function Tests: No results for input(s): AST, ALT, ALKPHOS, BILITOT, PROT, ALBUMIN in the last 168 hours. No results for input(s): LIPASE, AMYLASE in the last 168 hours. No results for input(s): AMMONIA in the last 168 hours. Coagulation Profile: No results for input(s): INR, PROTIME in the last 168 hours. Cardiac Enzymes: No results for input(s): CKTOTAL, CKMB, CKMBINDEX, TROPONINI in the last 168 hours. BNP (last 3 results) No results for input(s): PROBNP in the last 8760 hours. HbA1C: No results for input(s): HGBA1C in the last 72 hours. CBG: No results for input(s): GLUCAP in the last 168 hours. Lipid Profile: No results for input(s): CHOL, HDL, LDLCALC, TRIG, CHOLHDL, LDLDIRECT in the last 72 hours. Thyroid Function Tests: No results for input(s): TSH, T4TOTAL, FREET4, T3FREE, THYROIDAB in the last 72 hours. Anemia Panel: No results for input(s): VITAMINB12, FOLATE, FERRITIN, TIBC, IRON, RETICCTPCT in the last 72 hours. Sepsis Labs: No results for input(s): PROCALCITON, LATICACIDVEN in the last 168 hours.  Recent Results (from the past 240 hour(s))  Blood culture (routine x 2)     Status: None   Collection Time: 12/30/18 12:41 PM   Specimen: BLOOD  Result  Value Ref Range Status   Specimen Description   Final    BLOOD LEFT WRIST Performed at [facility] [address] [place], [place] [address]    Special Requests   Final    BOTTLES DRAWN AEROBIC ONLY Blood Culture results may not be optimal due to an inadequate volume of blood received in culture bottles Performed at [facility] [address], [place], [place] [address]    Culture   Final    NO GROWTH 5 DAYS Performed at [facility], [place] [address], [place], [place] [address]    Report Status 01/04/2019 FINAL  Final  SARS Coronavirus 2  (CEPHEID- Performed in [facility] hospital lab), Hosp Order     Status: None   Collection Time: 12/30/18  1:01 PM   Specimen: Nasopharyngeal Swab  Result Value Ref Range Status   SARS Coronavirus 2 NEGATIVE NEGATIVE Final    Comment: (NOTE) If result is NEGATIVE SARS-CoV-2 target nucleic acids are NOT DETECTED. The SARS-CoV-2 RNA is generally detectable in upper and lower  respiratory specimens during the acute phase of infection. The lowest  concentration of SARS-CoV-2 viral copies this assay can detect is 250  copies / mL. A negative result does not preclude SARS-CoV-2 infection  and should not be used as the sole basis for treatment or other  patient management decisions.  A negative result may occur with  improper specimen collection / handling, submission of specimen other  than nasopharyngeal swab, presence of viral mutation(s) within the  areas targeted by this assay, and inadequate number of viral copies  (<250 copies / mL). A negative result must be combined with clinical  observations, patient history, and epidemiological information. If result is POSITIVE SARS-CoV-2 target nucleic acids are DETECTED. The SARS-CoV-2 RNA is generally detectable in upper and lower  respiratory specimens dur ing the acute phase of infection.  Positive  results are indicative of active infection with SARS-CoV-2.  Clinical  correlation with patient history and other diagnostic information is  necessary to determine patient infection status.  Positive results do  not rule out bacterial infection or co-infection with other viruses. If result is PRESUMPTIVE POSTIVE SARS-CoV-2 nucleic acids MAY BE PRESENT.   A presumptive positive result was obtained on the submitted specimen  and confirmed on repeat testing.  While 2019 novel coronavirus  (SARS-CoV-2) nucleic acids may be present in the submitted sample  additional confirmatory testing may be necessary for epidemiological  and / or clinical  management purposes  to differentiate between  SARS-CoV-2 and other Sarbecovirus currently known to infect humans.  If clinically indicated additional testing with an alternate test  methodology [phone]) is advised. The SARS-CoV-2 RNA is generally  detectable in upper and lower respiratory sp ecimens during the acute  phase of infection. The expected result is Negative. Fact Sheet for Patients:  StrictlyIdeas.no Fact Sheet for Healthcare Providers: BankingDealers.co.za This test is not yet approved or cleared by the [place] FDA and has been authorized for detection and/or diagnosis of SARS-CoV-2 by FDA under an Emergency Use Authorization (EUA).  This EUA will remain in effect (meaning this test can be used) for the duration of the COVID-19 declaration under Section 564(b)(1) of the Act, 21 U.S.C. section 360bbb-3(b)(1), unless the authorization is terminated or revoked sooner. Performed at [facility], [place] [address], [place], [place] [address]   Blood culture (routine x 2)     Status: None   Collection Time:  12/30/18  1:20 PM   Specimen: BLOOD RIGHT FOREARM  Result Value Ref Range Status   Specimen Description   Final    BLOOD RIGHT FOREARM Performed at [facility] [address], [place], [place] [address]    Special Requests   Final    BOTTLES DRAWN AEROBIC AND ANAEROBIC Blood Culture adequate volume Performed at [facility] [address], [place], [place] [address]    Culture   Final    NO GROWTH 5 DAYS Performed at [facility], [place] [address], [place], [place] [address]    Report Status 01/04/2019 FINAL  Final  MRSA PCR Screening     Status: None   Collection Time: 12/31/18  8:45 AM   Specimen: Nasal Mucosa; Nasopharyngeal  Result Value Ref Range Status   MRSA by PCR NEGATIVE NEGATIVE Final    Comment:        The GeneXpert MRSA Assay  (FDA approved for NASAL specimens only), is one component of a comprehensive MRSA colonization surveillance program. It is not intended to diagnose MRSA infection nor to guide or monitor treatment for MRSA infections. Performed at [facility], [place] [address] [place], [place] [address]          Radiology Studies: No results found.      Scheduled Meds: . diatrizoate meglumine-sodium  90 mL Per NG tube Once  . docusate sodium  100 mg Oral BID  . enoxaparin (LOVENOX) injection  40 mg Subcutaneous Q24H  . pantoprazole  40 mg Oral Daily   Continuous Infusions: . 0.9 % NaCl with KCl 40 mEq / L 50 mL/hr (01/07/19 1205)  . piperacillin-tazobactam (ZOSYN)  IV 3.375 g (01/07/19 1355)     LOS: 8 days    Time spent: 6 min    Nicolette Bang, MD [facility]  If 7PM-7AM, please contact night-coverage  01/07/2019, 1:56 PM

## 2019-01-08 ENCOUNTER — Inpatient Hospital Stay (HOSPITAL_COMMUNITY): Payer: Medicare Other

## 2019-01-08 LAB — NOVEL CORONAVIRUS, NAA (HOSP ORDER, SEND-OUT TO REF LAB; TAT 18-24 HRS): SARS-CoV-2, NAA: NOT DETECTED

## 2019-01-08 NOTE — Progress Notes (Signed)
PROGRESS NOTE    Ashley Howard  DTO:[id number] DOB: [birth year] DOA: 12/30/2018 PCP: Lynnell Catalan, FNP   Brief Narrative:  Ashley Howard a 73 y.o.femalewith medical history significantformultiple abdominal surgeries, scoliosis, osteoporosis, compression fracture, tobacco abuse, previous SBO status post explorative laparotomy who presents fromassistedliving facility the emergency department for the evaluation of nausea, vomiting, abdominal pain. She was apparently all right until yesterday evening. After eating her dinner, she started having generalized abdominal pain that was worse in the epigastric region. She describes the pain as cramping, waxing. Her abdomen got progressively distended and she developed nausea and vomiting. She reports multiple episodes of vomiting since then. She reports that she had a small bowel movement in the assisted living facility. Since then she has not passed any flatus or has a bowel movement. Patient also reported of having some fullness in her chest. CT abdomen/pelvis done in the emergency department showed distal small bowel obstruction with transition point over the midline.It also showed bilateral patchy infiltrates suggesting multifocal pneumonia. COVID-19 negative.She has been treated with supportive care with NGT. General surgery following.   Assessment & Plan:   Principal Problem:   Small bowel obstruction (HCC) Active Problems:   SBO (small bowel obstruction) (HCC)   Hyponatremia   T12 compression fracture (HCC)   HCAP (healthcare-associated pneumonia)   SBO -Appreciate general surgery input -Small bowel obstruction resolved -Follow-up KUB this morning negative  Multifocal pneumonia -Could be aspiration in setting of multiple episodes of vomiting -Remains on room air without respiratory distress -MRSA PCR negative.  -Due to fever on 6/21, antibiotic was broadened to Zosyn -CXR 6/21 with mild hazy right costophrenic  angle opacity -cont to follow clinically-appears to be doing well  T12 compression fracture -CT revealed moderate T12 compression fracture, age indeterminant. Pain control  Hypokalemia -Replace, trend   DVT prophylaxis: Lovenox SQ  Code Status: full    Code Status Orders  (From admission, onward)         Start     Ordered   12/30/18 1405  Full code  Continuous     12/30/18 1405        Code Status History    Date Active Date Inactive Code Status Order ID Comments User Context   08/21/2016 1711 08/29/2016 1745 Full Code [id number]  Waldemar Dickens, MD ED   07/16/2016 2240 07/24/2016 2139 Full Code [id number]  Armandina Gemma, MD ED   11/07/2011 1047 11/11/2011 1650 Full Code [id number]  Grandville Silos, RN Inpatient   Advance Care Planning Activity     Family Communication: none today Disposition Plan:   Patient remained inpatient for continued treatment of nausea vomiting and diarrhea, subspecialty evaluation, electrolyte monitoring, frequent nurse intervention. Small bowel obstruction cleared anticipate discharge in 1 to 2 days if nausea and vomiting resolves.  Without these treatments anticipate patient will have worsening clinical deterioration. Consults called: None Admission status: Inpatient   Consultants:   gen surg  Procedures:  Dg Abd 1 View  Result Date: 01/08/2019 CLINICAL DATA:  Per order: reported resolved SBO now with significant diarrhea and nausea. Pt had a laparoscopic cholecystectomy in 08/2016. EXAM: ABDOMEN - 1 VIEW COMPARISON:  01/04/2019 FINDINGS: Removal of gastric tube. Cholecystectomy clips. Normal bowel gas pattern. T12 compression deformity, present since 09/09/2018. Mild thoracolumbar scoliosis with multilevel spondylitic change in the lumbar spine. IMPRESSION: Normal bowel gas pattern. Electronically Signed   By: Lucrezia Europe M.D.   On: 01/08/2019 12:39   Ct Abdomen Pelvis W Contrast  Result Date: 12/30/2018 CLINICAL DATA:  Epigastric pain and  constipation.  Vomiting. EXAM: CT ABDOMEN AND PELVIS WITH CONTRAST TECHNIQUE: Multidetector CT imaging of the abdomen and pelvis was performed using the standard protocol following bolus administration of intravenous contrast. CONTRAST:  151mL OMNIPAQUE IOHEXOL 300 MG/ML  SOLN COMPARISON:  08/21/2016 FINDINGS: Lower chest: Lung bases demonstrate patchy airspace process right worse than left likely due to multifocal pneumonia. No effusion. Fluid-filled dilated distal esophagus with fluid-filled moderate size hiatal hernia. Moderate narrowing of the stomach as it passes through the diaphragmatic hiatus. Hepatobiliary: Prior cholecystectomy. Liver is within normal. Biliary tree is unremarkable. Pancreas: Normal. Spleen: Normal. Adrenals/Urinary Tract: Adrenal glands are normal. Kidneys are normal in size without focal mass or hydronephrosis. Subcentimeter right renal cortical hypodensity too small to characterize but likely a cyst. Ureters and bladder are normal. Stomach/Bowel: Moderate-sized hiatal hernia with narrowing of the hernia as passes through the diaphragmatic hiatus. Multiple fluid-filled dilated small bowel loops with the ileum measuring up to 5.4 cm in diameter. There is an abrupt transition point of the ileum in the midline of the mid to lower abdomen just below the level of the aortic bifurcation with associated whirling of the midline mesentery. Remainder of the distal ileum is decompressed. Small amount of air and stool throughout the colon which is otherwise mildly decompressed. Moderate diverticulosis of the descending and sigmoid colon. Appendix not visualized. Vascular/Lymphatic: Moderate calcified plaque over the abdominal aorta and iliac arteries. No adenopathy. Reproductive: Prior hysterectomy. Other: No free peritoneal air and no significant free fluid. Musculoskeletal: Degenerate changes spine with the level disc disease over the lumbar spine. Stable grade 1 anterolisthesis of L3 on L4 and L4  on L5. Mild stable compression deformities of L2 and L4. Moderate T12 compression fracture age indeterminate but new since the previous exam. IMPRESSION: 1. Distal small bowel obstruction with abrupt transition point over the midline mid to lower abdomen involving the ileum with associated whirling of the mesentery. Small bowel measures up to 5.4 cm in diameter. Obstruction likely due to adhesions. 2. Patchy airspace process over the lung bases right worse than left compatible with multifocal pneumonia. 3. Moderate size hiatal hernia containing fluid with fluid over the distal esophagus and marked narrowing of the hernia as it passes through the diaphragmatic hiatus. Possible degree of obstruction. 4. Moderate T12 compression fracture age indeterminate but new since the prior exam. Stable mild compression deformities of L2 and L4. 5. Colonic diverticulosis. Subcentimeter right renal cortical hypodensity too small to characterize but likely a cyst. Other postsurgical changes as described. 6.  Aortic Atherosclerosis (ICD10-I70.0). Electronically Signed   By: Marin Olp M.D.   On: 12/30/2018 12:25   Dg Chest Port 1 View  Result Date: 01/03/2019 CLINICAL DATA:  Dyspnea, pneumonia EXAM: PORTABLE CHEST 1 VIEW COMPARISON:  09/07/2017 chest radiograph. FINDINGS: Enteric tube enters stomach with the tip not seen on this image. Stable cardiomediastinal silhouette with normal heart size. No pneumothorax. No pleural effusion. Mild hazy right costophrenic angle opacity. No pulmonary edema. IMPRESSION: 1. Well-positioned enteric tube. 2. Mild hazy right costophrenic angle opacity, cannot exclude pneumonia. Chest radiograph follow-up advised. Electronically Signed   By: Ilona Sorrel M.D.   On: 01/03/2019 11:26   Dg Abd Portable 1v  Result Date: 01/04/2019 CLINICAL DATA:  Small-bowel obstruction. EXAM: PORTABLE ABDOMEN - 1 VIEW COMPARISON:  01/02/2019.  01/01/2019.  CT 12/30/2018 FINDINGS: Surgical clips right upper  quadrant. NG tube noted with tip over the stomach. Persistent distended loops of  small bowel noted. Oral contrast has continued to transit through the colon. No free air. Vascular calcification. Degenerative changes scoliosis thoracic spine IMPRESSION: 1.  NG tube noted with tip in the stomach. 2. Persistent distended loops of small bowel noted. Oral contrast is continue to transit through the colon. No free air. Electronically Signed   By: Marcello Moores  Register   On: 01/04/2019 07:39   Dg Abd Portable 1v  Result Date: 01/02/2019 CLINICAL DATA:  Small bowel obstruction EXAM: PORTABLE ABDOMEN - 1 VIEW COMPARISON:  Abdominal radiograph from 1 day prior FINDINGS: Enteric tube terminates in the distal stomach. Cholecystectomy clips are seen in the right upper quadrant of the abdomen. Stable mildly dilated small bowel loops in the left upper abdomen. Retained oral contrast throughout the colon transiting to the rectum. No evidence of pneumatosis or pneumoperitoneum. IMPRESSION: 1. Enteric tube tip is in the distal stomach. 2. Stable mildly dilated small bowel loops in the left upper abdomen. Retained oral contrast throughout the colon transiting to the rectum. Findings suggest resolving small bowel obstruction. Electronically Signed   By: Ilona Sorrel M.D.   On: 01/02/2019 08:48   Dg Abd Portable 1v  Result Date: 01/01/2019 CLINICAL DATA:  Small-bowel obstruction. EXAM: PORTABLE ABDOMEN - 1 VIEW COMPARISON:  12/31/2018. FINDINGS: NG tube noted coiled stomach. Improvement of small-bowel distention. Oral contrast throughout the colon on today's exam. Degenerative changes scoliosis lumbar spine. Stable upper lumbar compression fracture. Degenerative change both hips. IMPRESSION: NG tube noted coiled stomach. Improvement of small-bowel distention. Oral contrast noted throughout the colon on today's exam. No free air. Electronically Signed   By: Marcello Moores  Register   On: 01/01/2019 07:18   Dg Abd Portable 1v-small Bowel  Obstruction Protocol-initial, 8 Hr Delay  Result Date: 12/31/2018 CLINICAL DATA:  Small bowel protocol 8 hour delay EXAM: PORTABLE ABDOMEN - 1 VIEW COMPARISON:  Six 03/02/2019, 12/30/2018, CT 12/30/2018 FINDINGS: Esophageal tube tip overlies the proximal duodenum. Little change in multiple loops of dilated small bowel, with prominent right lower quadrant bowel loop measuring up to 7.5 cm. Some contrast in the right upper quadrant is presumably within the hepatic flexure of the colon. IMPRESSION: 1. Little interval change in multiple loops of dilated small consistent with a bowel obstruction. A small amount of contrast is visible in the right colon. Electronically Signed   By: Donavan Foil M.D.   On: 12/31/2018 22:35   Dg Abd Portable 1v  Result Date: 12/30/2018 CLINICAL DATA:  NG tube repositioned EXAM: PORTABLE ABDOMEN - 1 VIEW COMPARISON:  None. FINDINGS: NG tube again noted coiling in the lower chest, likely within the hiatal hernia. IMPRESSION: NG tube coils in the lower chest, likely in the hiatal hernia. Electronically Signed   By: Rolm Baptise M.D.   On: 12/30/2018 20:02   Dg Abd Portable 1v-small Bowel Protocol-position Verification  Result Date: 12/30/2018 CLINICAL DATA:  Nasogastric tube placement EXAM: PORTABLE ABDOMEN - 1 VIEW COMPARISON:  Portable exam 1630 hours compared to earlier CT of 12/30/2018 FINDINGS: Tip of nasogastric tube projects over stomach though the proximal side-port projects over the distal esophagus, recommend advancing tube 7-8 cm. Gaseous distention of bowel loops in the mid abdomen and pelvis. No definite bowel wall thickening. Excreted contrast material within renal collecting systems and bladder. Bones demineralized with dextroconvex thoracolumbar scoliosis and scattered degenerative changes. IMPRESSION: Recommend advancing nasogastric tube 7-8 cm. Electronically Signed   By: Lavonia Dana M.D.   On: 12/30/2018 17:05   Dg Naso G Tube Plc  W/fl W/rad  Result Date:  12/31/2018 CLINICAL DATA:  Requires NG tube placement. EXAM: NASO G TUBE PLACEMENT WITH FL AND WITH RAD CONTRAST:  40mL OMNIPAQUE IOHEXOL 300 MG/ML  SOLN FLUOROSCOPY TIME:  Fluoroscopy Time:  48 seconds Radiation Exposure Index (if provided by the fluoroscopic device): 11.9 mGy Number of Acquired Spot Images: 0 COMPARISON:  None. FINDINGS: The indwelling nasogastric tube was manipulated under direct fluoroscopic guidance a into the proximal duodenum just beyond the pylorus period. Injection of water-soluble contrast material confirms placement of tube tip within the proximal duodenum. IMPRESSION: 1. Successful post pyloric placement of indwelling nasogastric tube. Electronically Signed   By: Kerby Moors M.D.   On: 12/31/2018 13:37     Antimicrobials:   Zosyn 6/7   Subjective: Patient reported some nausea and vomiting this morning.  No abdominal pain, also reported diarrhea.  Objective: Vitals:   01/07/19 2244 01/08/19 0607 01/08/19 0623 01/08/19 1111  BP:  (!) 150/92 (!) 143/84   Pulse:  72    Resp:  17    Temp: 99.4 F (37.4 C) 98.3 F (36.8 C)  98.9 F (37.2 C)  TempSrc:  Oral  Oral  SpO2:  100%    Weight:      Height:        Intake/Output Summary (Last 24 hours) at 01/08/2019 1428 Last data filed at 01/08/2019 1259 Gross per 24 hour  Intake 1678.57 ml  Output 2475 ml  Net -796.43 ml   Filed Weights   12/30/18 1242  Weight: (S) 64.9 kg    Examination:  General exam: Appears calm, mild abdominal discomfort Respiratory system: Clear to auscultation. Respiratory effort normal. Cardiovascular system: S1 & S2 heard, RRR. No JVD, murmurs, rubs, gallops or clicks. No pedal edema. Gastrointestinal system: Positive bowel sounds, no abdominal pain no rebound no guarding, not distended, Central nervous system: Alert and oriented. No focal neurological deficits. Extremities: Warm and well perfused, no contractures moves all 4 extremities freely Skin: No rashes, lesions or  ulcers Psychiatry: Judgement and insight appear normal. Mood & affect appropriate.     Data Reviewed: I have personally reviewed following labs and imaging studies  CBC: Recent Labs  Lab 01/04/19 0318 01/05/19 0445 01/06/19 0342  WBC 8.1 8.5 7.5  HGB 12.2 12.0 11.7*  HCT 38.7 36.1 35.0*  MCV 100.8* 96.8 98.6  PLT 286 295 096   Basic Metabolic Panel: Recent Labs  Lab 01/02/19 0416 01/03/19 0327 01/04/19 0318 01/05/19 0445 01/06/19 0342  NA 135 137 139 137 134*  K 3.3* 3.2* 3.4* 3.4* 3.8  CL 102 103 106 105 104  CO2 24 22 18* 20* 23  GLUCOSE 101* 91 77 109* 100*  BUN 8 10 13 8  <5*  CREATININE 0.65 0.61 0.64 0.54 0.66  CALCIUM 8.3* 8.3* 8.4* 8.3* 8.4*  MG 2.3 2.2 2.3  --  1.7   GFR: Estimated Creatinine Clearance: 50.5 mL/min (by C-G formula based on SCr of 0.66 mg/dL). Liver Function Tests: No results for input(s): AST, ALT, ALKPHOS, BILITOT, PROT, ALBUMIN in the last 168 hours. No results for input(s): LIPASE, AMYLASE in the last 168 hours. No results for input(s): AMMONIA in the last 168 hours. Coagulation Profile: No results for input(s): INR, PROTIME in the last 168 hours. Cardiac Enzymes: No results for input(s): CKTOTAL, CKMB, CKMBINDEX, TROPONINI in the last 168 hours. BNP (last 3 results) No results for input(s): PROBNP in the last 8760 hours. HbA1C: No results for input(s): HGBA1C in the last 72 hours.  CBG: No results for input(s): GLUCAP in the last 168 hours. Lipid Profile: No results for input(s): CHOL, HDL, LDLCALC, TRIG, CHOLHDL, LDLDIRECT in the last 72 hours. Thyroid Function Tests: No results for input(s): TSH, T4TOTAL, FREET4, T3FREE, THYROIDAB in the last 72 hours. Anemia Panel: No results for input(s): VITAMINB12, FOLATE, FERRITIN, TIBC, IRON, RETICCTPCT in the last 72 hours. Sepsis Labs: No results for input(s): PROCALCITON, LATICACIDVEN in the last 168 hours.  Recent Results (from the past 240 hour(s))  Blood culture (routine x 2)      Status: None   Collection Time: 12/30/18 12:41 PM   Specimen: BLOOD  Result Value Ref Range Status   Specimen Description   Final    BLOOD LEFT WRIST Performed at [facility] [address], [place], [place] [address]    Special Requests   Final    BOTTLES DRAWN AEROBIC ONLY Blood Culture results may not be optimal due to an inadequate volume of blood received in culture bottles Performed at [facility] [address], [place], [place] [address]    Culture   Final    NO GROWTH 5 DAYS Performed at [facility], [place] [address] [place], [place] [address]    Report Status 01/04/2019 FINAL  Final  SARS Coronavirus 2 (CEPHEID- Performed in [place] hospital lab), Hosp Order     Status: None   Collection Time: 12/30/18  1:01 PM   Specimen: Nasopharyngeal Swab  Result Value Ref Range Status   SARS Coronavirus 2 NEGATIVE NEGATIVE Final    Comment: (NOTE) If result is NEGATIVE SARS-CoV-2 target nucleic acids are NOT DETECTED. The SARS-CoV-2 RNA is generally detectable in upper and lower  respiratory specimens during the acute phase of infection. The lowest  concentration of SARS-CoV-2 viral copies this assay can detect is 250  copies / mL. A negative result does not preclude SARS-CoV-2 infection  and should not be used as the sole basis for treatment or other  patient management decisions.  A negative result may occur with  improper specimen collection / handling, submission of specimen other  than nasopharyngeal swab, presence of viral mutation(s) within the  areas targeted by this assay, and inadequate number of viral copies  (<250 copies / mL). A negative result must be combined with clinical  observations, patient history, and epidemiological information. If result is POSITIVE SARS-CoV-2 target nucleic acids are DETECTED. The SARS-CoV-2 RNA is generally detectable in upper and lower  respiratory specimens dur ing the acute phase of  infection.  Positive  results are indicative of active infection with SARS-CoV-2.  Clinical  correlation with patient history and other diagnostic information is  necessary to determine patient infection status.  Positive results do  not rule out bacterial infection or co-infection with other viruses. If result is PRESUMPTIVE POSTIVE SARS-CoV-2 nucleic acids MAY BE PRESENT.   A presumptive positive result was obtained on the submitted specimen  and confirmed on repeat testing.  While 2019 novel coronavirus  (SARS-CoV-2) nucleic acids may be present in the submitted sample  additional confirmatory testing may be necessary for epidemiological  and / or clinical management purposes  to differentiate between  SARS-CoV-2 and other Sarbecovirus currently known to infect humans.  If clinically indicated additional testing with an alternate test  methodology [phone]) is advised. The SARS-CoV-2 RNA is generally  detectable in upper and lower respiratory sp ecimens during the acute  phase of infection. The expected result is Negative. Fact Sheet for  Patients:  StrictlyIdeas.no Fact Sheet for Healthcare Providers: BankingDealers.co.za This test is not yet approved or cleared by the [place] FDA and has been authorized for detection and/or diagnosis of SARS-CoV-2 by FDA under an Emergency Use Authorization (EUA).  This EUA will remain in effect (meaning this test can be used) for the duration of the COVID-19 declaration under Section 564(b)(1) of the Act, 21 U.S.C. section 360bbb-3(b)(1), unless the authorization is terminated or revoked sooner. Performed at [facility], [place] [address] [place], [place] [address]   Blood culture (routine x 2)     Status: None   Collection Time: 12/30/18  1:20 PM   Specimen: BLOOD RIGHT FOREARM  Result Value Ref Range Status   Specimen Description   Final    BLOOD RIGHT FOREARM Performed  at [facility] [address], [place], [place] [address]    Special Requests   Final    BOTTLES DRAWN AEROBIC AND ANAEROBIC Blood Culture adequate volume Performed at [facility] [address], [place], [place] [address]    Culture   Final    NO GROWTH 5 DAYS Performed at [facility], [place] [address] [place], [place] [address]    Report Status 01/04/2019 FINAL  Final  MRSA PCR Screening     Status: None   Collection Time: 12/31/18  8:45 AM   Specimen: Nasal Mucosa; Nasopharyngeal  Result Value Ref Range Status   MRSA by PCR NEGATIVE NEGATIVE Final    Comment:        The GeneXpert MRSA Assay (FDA approved for NASAL specimens only), is one component of a comprehensive MRSA colonization surveillance program. It is not intended to diagnose MRSA infection nor to guide or monitor treatment for MRSA infections. Performed at [facility], [place] [address] [place], [place] [address]   Novel Coronavirus, NAA (hospital order; send-out to ref lab)     Status: None   Collection Time: 01/07/19  9:09 AM   Specimen: Nasopharyngeal Swab; Respiratory  Result Value Ref Range Status   SARS-CoV-2, NAA NOT DETECTED NOT DETECTED Final    Comment: (NOTE) This test was developed and its performance characteristics determined by [employer]. This test has not been FDA cleared or approved. This test has been authorized by FDA under an Emergency Use Authorization (EUA). This test is only authorized for the duration of time the declaration that circumstances exist justifying the authorization of the emergency use of in vitro diagnostic tests for detection of SARS-CoV-2 virus and/or diagnosis of COVID-19 infection under section 564(b)(1) of the Act, 21 U.S.C. 401UUV-2(Z)(3), unless the authorization is terminated or revoked sooner. When diagnostic testing is negative, the possibility of a false negative result should be  considered in the context of a patient's recent exposures and the presence of clinical signs and symptoms consistent with COVID-19. An individual without symptoms of COVID-19 and who is not shedding SARS-CoV-2 virus would expect to have a negative (not detected) result in this assay. Performed  At: [facility] [place], [place] [id number] Rush Farmer MD QV:[id number]    [place]  Final    Comment: Performed at [facility] [address], [place], [place] [address]         Radiology Studies: Dg Abd 1 View  Result Date: 01/08/2019 CLINICAL DATA:  Per order: reported resolved SBO now with significant diarrhea and nausea. Pt had a laparoscopic cholecystectomy in 08/2016. EXAM: ABDOMEN -  1 VIEW COMPARISON:  01/04/2019 FINDINGS: Removal of gastric tube. Cholecystectomy clips. Normal bowel gas pattern. T12 compression deformity, present since 09/09/2018. Mild thoracolumbar scoliosis with multilevel spondylitic change in the lumbar spine. IMPRESSION: Normal bowel gas pattern. Electronically Signed   By: Lucrezia Europe M.D.   On: 01/08/2019 12:39        Scheduled Meds: . diatrizoate meglumine-sodium  90 mL Per NG tube Once  . docusate sodium  100 mg Oral BID  . enoxaparin (LOVENOX) injection  40 mg Subcutaneous Q24H  . pantoprazole  40 mg Oral Daily   Continuous Infusions: . 0.9 % NaCl with KCl 40 mEq / L 50 mL/hr at 01/08/19 1000  . piperacillin-tazobactam (ZOSYN)  IV 3.375 g (01/08/19 0622)     LOS: 9 days    Time spent: 52 min    Nicolette Bang, MD [facility]  If 7PM-7AM, please contact night-coverage  01/08/2019, 2:28 PM

## 2019-01-08 NOTE — Care Management Important Message (Signed)
Important Message  Patient Details IM Letter given to Servando Snare SW to present to the Patient Name: Ashley Howard MRN: [id number] Date of Birth: 06/15/1946   Medicare Important Message Given:  Yes     Kerin Salen 01/08/2019, 11:28 AM

## 2019-01-08 NOTE — Progress Notes (Signed)
Physical Therapy Treatment Patient Details Name: Ashley Howard MRN: [id number] DOB: 04/23/1946 Today's Date: 01/08/2019    History of Present Illness 73 yo female admitted with SBO. Hx of SBO, multiple abd surgeries, hiatal hernia, comp fx, scoliosis, oteoporosis    PT Comments    Patient progressing with confidence and endurance.  Reports walked hallway with nursing earlier today, but agreeable to go again.  Feel she remains appropriate for HHPT.  PT to follow acutely.   Follow Up Recommendations  Home health PT;Supervision - Intermittent     Equipment Recommendations  None recommended by PT    Recommendations for Other Services       Precautions / Restrictions Precautions Precautions: Fall    Mobility  Bed Mobility Overal bed mobility: Needs Assistance       Supine to sit: Supervision Sit to supine: Min guard   General bed mobility comments: assist for lines, safety and positioning  Transfers   Equipment used: Rolling walker (2 wheeled) Transfers: Sit to/from Stand Sit to Stand: Supervision;Min guard         General transfer comment: initially S, then minguard for steadying  Ambulation/Gait Ambulation/Gait assistance: Supervision;Min guard Gait Distance (Feet): 400 Feet Assistive device: Rolling walker (2 wheeled) Gait Pattern/deviations: Step-through pattern;Decreased step length - right     General Gait Details: cues for posture, slow but steady pace   Stairs             Wheelchair Mobility    Modified Rankin (Stroke Patients Only)       Balance Overall balance assessment: Mild deficits observed, not formally tested                                          Cognition Arousal/Alertness: Awake/alert Behavior During Therapy: WFL for tasks assessed/performed Overall Cognitive Status: Within Functional Limits for tasks assessed                                        Exercises      General  Comments General comments (skin integrity, edema, etc.): reports tolerating soft diet      Pertinent Vitals/Pain Pain Assessment: No/denies pain Pain Location: no pain, but stomach feels [place]                      Prior Function            PT Goals (current goals can now be found in the care plan section) Progress towards PT goals: Progressing toward goals    Frequency    Min 3X/week      PT Plan Current plan remains appropriate    Co-evaluation              AM-PAC PT "6 Clicks" Mobility   Outcome Measure  Help needed turning from your back to your side while in a flat bed without using bedrails?: A Little Help needed moving from lying on your back to sitting on the side of a flat bed without using bedrails?: None Help needed moving to and from a bed to a chair (including a wheelchair)?: A Little Help needed standing up from a chair using your arms (e.g., wheelchair or bedside chair)?: A Little Help needed to walk in hospital room?: A  Little Help needed climbing 3-5 steps with a railing? : A Little 6 Click Score: 19    End of Session   Activity Tolerance: Patient tolerated treatment well Patient left: in bed;with call bell/phone within reach;with bed alarm set   PT Visit Diagnosis: Muscle weakness (generalized) (M62.81);Unsteadiness on feet (R26.81)     Time: 8485-9276 PT Time Calculation (min) (ACUTE ONLY): 13 min  Charges:  $Gait Training: 8-22 mins                     Magda Kiel, [place] [phone] 01/08/2019    Reginia Naas 01/08/2019, 3:41 PM

## 2019-01-09 LAB — BASIC METABOLIC PANEL
Anion gap: 9 (ref 5–15)
BUN: 9 mg/dL (ref 8–23)
CO2: 23 mmol/L (ref 22–32)
Calcium: 9.4 mg/dL (ref 8.9–10.3)
Chloride: 105 mmol/L (ref 98–111)
Creatinine, Ser: 0.81 mg/dL (ref 0.44–1.00)
GFR calc Af Amer: >60 mL/min (ref >60)
GFR calc non Af Amer: >60 mL/min (ref >60)
Glucose, Bld: 99 mg/dL (ref 70–99)
Potassium: 4.5 mmol/L (ref 3.5–5.1)
Sodium: 137 mmol/L (ref 135–145)

## 2019-01-09 LAB — MAGNESIUM: Magnesium: 1.9 mg/dL (ref 1.7–2.4)

## 2019-01-09 NOTE — Progress Notes (Signed)
PROGRESS NOTE    Ashley AZER  Howard:[id number] DOB: 05/01/1946 DOA: 12/30/2018 PCP: Lynnell Catalan, FNP   Brief Narrative:  Ashley Zwick McDanielis a 73 y.o.femalewith medical history significantformultiple abdominal surgeries, scoliosis, osteoporosis, compression fracture, tobacco abuse, previous SBO status post explorative laparotomy who presents fromassistedliving facility the emergency department for the evaluation of nausea, vomiting, abdominal pain. She was apparently all right until yesterday evening. After eating her dinner, she started having generalized abdominal pain that was worse in the epigastric region. She describes the pain as cramping, waxing. Her abdomen got progressively distended and she developed nausea and vomiting. She reports multiple episodes of vomiting since then. She reports that she had a small bowel movement in the assisted living facility. Since then she has not passed any flatus or has a bowel movement. Patient also reported of having some fullness in her chest. CT abdomen/pelvis done in the emergency department showed distal small bowel obstruction with transition point over the midline.It also showed bilateral patchy infiltrates suggesting multifocal pneumonia. COVID-19 negative.She has been treated with supportive care with NGT. General surgery following.    Assessment & Plan:   Principal Problem:   Small bowel obstruction (HCC) Active Problems:   SBO (small bowel obstruction) (HCC)   Hyponatremia   T12 compression fracture (HCC)   HCAP (healthcare-associated pneumonia)  SBO -Appreciate general surgeryinput -Small bowel obstructionresolved -Follow-up KUB negative -Patient still with some nausea this morning slowly improving  Multifocal pneumonia -Could be aspiration in setting of multiple episodes of vomiting -Remains on room air without respiratory distress -MRSA PCR negative.  -Due to fever on 6/21, antibiotic was broadened to  Zosyn last day today -CXR 6/21 with mild hazy right costophrenic angle opacity -cont to follow clinically-appears to be doing well  T12 compression fracture -CT revealed moderate T12 compression fracture, age indeterminant. Pain control  Hypokalemia -Replace, trend    DVT prophylaxis: Lovenox SQ  Code Status: full    Code Status Orders  (From admission, onward)         Start     Ordered   12/30/18 1405  Full code  Continuous     12/30/18 1405        Code Status History    Date Active Date Inactive Code Status Order ID Comments User Context   08/21/2016 1711 08/29/2016 1745 Full Code [id number]  Waldemar Dickens, MD ED   07/16/2016 2240 07/24/2016 2139 Full Code [id number]  Armandina Gemma, MD ED   11/07/2011 1047 11/11/2011 1650 Full Code [id number]  Grandville Silos, RN Inpatient   Advance Care Planning Activity     Family Communication: none today  Disposition Plan:   Patient remained inpatient for continued treatment of nausea vomiting and diarrhea, electrolyte monitoring, frequent nurse intervention. Small bowel obstruction cleared anticipate discharge in 1 to 2 days if nausea and vomiting resolves.  Without these treatments anticipate patient will have worsening clinical deterioration and likely readmission Consults called: None Admission status: Inpatient   Consultants:   gen surg  Procedures:  Dg Abd 1 View  Result Date: 01/08/2019 CLINICAL DATA:  Per order: reported resolved SBO now with significant diarrhea and nausea. Pt had a laparoscopic cholecystectomy in 08/2016. EXAM: ABDOMEN - 1 VIEW COMPARISON:  01/04/2019 FINDINGS: Removal of gastric tube. Cholecystectomy clips. Normal bowel gas pattern. T12 compression deformity, present since 09/09/2018. Mild thoracolumbar scoliosis with multilevel spondylitic change in the lumbar spine. IMPRESSION: Normal bowel gas pattern. Electronically Signed   By: Eden Emms.D.  On: 01/08/2019 12:39   Ct Abdomen Pelvis W  Contrast  Result Date: 12/30/2018 CLINICAL DATA:  Epigastric pain and constipation.  Vomiting. EXAM: CT ABDOMEN AND PELVIS WITH CONTRAST TECHNIQUE: Multidetector CT imaging of the abdomen and pelvis was performed using the standard protocol following bolus administration of intravenous contrast. CONTRAST:  131mL OMNIPAQUE IOHEXOL 300 MG/ML  SOLN COMPARISON:  08/21/2016 FINDINGS: Lower chest: Lung bases demonstrate patchy airspace process right worse than left likely due to multifocal pneumonia. No effusion. Fluid-filled dilated distal esophagus with fluid-filled moderate size hiatal hernia. Moderate narrowing of the stomach as it passes through the diaphragmatic hiatus. Hepatobiliary: Prior cholecystectomy. Liver is within normal. Biliary tree is unremarkable. Pancreas: Normal. Spleen: Normal. Adrenals/Urinary Tract: Adrenal glands are normal. Kidneys are normal in size without focal mass or hydronephrosis. Subcentimeter right renal cortical hypodensity too small to characterize but likely a cyst. Ureters and bladder are normal. Stomach/Bowel: Moderate-sized hiatal hernia with narrowing of the hernia as passes through the diaphragmatic hiatus. Multiple fluid-filled dilated small bowel loops with the ileum measuring up to 5.4 cm in diameter. There is an abrupt transition point of the ileum in the midline of the mid to lower abdomen just below the level of the aortic bifurcation with associated whirling of the midline mesentery. Remainder of the distal ileum is decompressed. Small amount of air and stool throughout the colon which is otherwise mildly decompressed. Moderate diverticulosis of the descending and sigmoid colon. Appendix not visualized. Vascular/Lymphatic: Moderate calcified plaque over the abdominal aorta and iliac arteries. No adenopathy. Reproductive: Prior hysterectomy. Other: No free peritoneal air and no significant free fluid. Musculoskeletal: Degenerate changes spine with the level disc disease  over the lumbar spine. Stable grade 1 anterolisthesis of L3 on L4 and L4 on L5. Mild stable compression deformities of L2 and L4. Moderate T12 compression fracture age indeterminate but new since the previous exam. IMPRESSION: 1. Distal small bowel obstruction with abrupt transition point over the midline mid to lower abdomen involving the ileum with associated whirling of the mesentery. Small bowel measures up to 5.4 cm in diameter. Obstruction likely due to adhesions. 2. Patchy airspace process over the lung bases right worse than left compatible with multifocal pneumonia. 3. Moderate size hiatal hernia containing fluid with fluid over the distal esophagus and marked narrowing of the hernia as it passes through the diaphragmatic hiatus. Possible degree of obstruction. 4. Moderate T12 compression fracture age indeterminate but new since the prior exam. Stable mild compression deformities of L2 and L4. 5. Colonic diverticulosis. Subcentimeter right renal cortical hypodensity too small to characterize but likely a cyst. Other postsurgical changes as described. 6.  Aortic Atherosclerosis (ICD10-I70.0). Electronically Signed   By: Marin Olp M.D.   On: 12/30/2018 12:25   Dg Chest Port 1 View  Result Date: 01/03/2019 CLINICAL DATA:  Dyspnea, pneumonia EXAM: PORTABLE CHEST 1 VIEW COMPARISON:  09/07/2017 chest radiograph. FINDINGS: Enteric tube enters stomach with the tip not seen on this image. Stable cardiomediastinal silhouette with normal heart size. No pneumothorax. No pleural effusion. Mild hazy right costophrenic angle opacity. No pulmonary edema. IMPRESSION: 1. Well-positioned enteric tube. 2. Mild hazy right costophrenic angle opacity, cannot exclude pneumonia. Chest radiograph follow-up advised. Electronically Signed   By: Ilona Sorrel M.D.   On: 01/03/2019 11:26   Dg Abd Portable 1v  Result Date: 01/04/2019 CLINICAL DATA:  Small-bowel obstruction. EXAM: PORTABLE ABDOMEN - 1 VIEW COMPARISON:   01/02/2019.  01/01/2019.  CT 12/30/2018 FINDINGS: Surgical clips right upper quadrant. NG  tube noted with tip over the stomach. Persistent distended loops of small bowel noted. Oral contrast has continued to transit through the colon. No free air. Vascular calcification. Degenerative changes scoliosis thoracic spine IMPRESSION: 1.  NG tube noted with tip in the stomach. 2. Persistent distended loops of small bowel noted. Oral contrast is continue to transit through the colon. No free air. Electronically Signed   By: Marcello Moores  Register   On: 01/04/2019 07:39   Dg Abd Portable 1v  Result Date: 01/02/2019 CLINICAL DATA:  Small bowel obstruction EXAM: PORTABLE ABDOMEN - 1 VIEW COMPARISON:  Abdominal radiograph from 1 day prior FINDINGS: Enteric tube terminates in the distal stomach. Cholecystectomy clips are seen in the right upper quadrant of the abdomen. Stable mildly dilated small bowel loops in the left upper abdomen. Retained oral contrast throughout the colon transiting to the rectum. No evidence of pneumatosis or pneumoperitoneum. IMPRESSION: 1. Enteric tube tip is in the distal stomach. 2. Stable mildly dilated small bowel loops in the left upper abdomen. Retained oral contrast throughout the colon transiting to the rectum. Findings suggest resolving small bowel obstruction. Electronically Signed   By: Ilona Sorrel M.D.   On: 01/02/2019 08:48   Dg Abd Portable 1v  Result Date: 01/01/2019 CLINICAL DATA:  Small-bowel obstruction. EXAM: PORTABLE ABDOMEN - 1 VIEW COMPARISON:  12/31/2018. FINDINGS: NG tube noted coiled stomach. Improvement of small-bowel distention. Oral contrast throughout the colon on today's exam. Degenerative changes scoliosis lumbar spine. Stable upper lumbar compression fracture. Degenerative change both hips. IMPRESSION: NG tube noted coiled stomach. Improvement of small-bowel distention. Oral contrast noted throughout the colon on today's exam. No free air. Electronically Signed   By:  Marcello Moores  Register   On: 01/01/2019 07:18   Dg Abd Portable 1v-small Bowel Obstruction Protocol-initial, 8 Hr Delay  Result Date: 12/31/2018 CLINICAL DATA:  Small bowel protocol 8 hour delay EXAM: PORTABLE ABDOMEN - 1 VIEW COMPARISON:  Six 03/02/2019, 12/30/2018, CT 12/30/2018 FINDINGS: Esophageal tube tip overlies the proximal duodenum. Little change in multiple loops of dilated small bowel, with prominent right lower quadrant bowel loop measuring up to 7.5 cm. Some contrast in the right upper quadrant is presumably within the hepatic flexure of the colon. IMPRESSION: 1. Little interval change in multiple loops of dilated small consistent with a bowel obstruction. A small amount of contrast is visible in the right colon. Electronically Signed   By: Donavan Foil M.D.   On: 12/31/2018 22:35   Dg Abd Portable 1v  Result Date: 12/30/2018 CLINICAL DATA:  NG tube repositioned EXAM: PORTABLE ABDOMEN - 1 VIEW COMPARISON:  None. FINDINGS: NG tube again noted coiling in the lower chest, likely within the hiatal hernia. IMPRESSION: NG tube coils in the lower chest, likely in the hiatal hernia. Electronically Signed   By: Rolm Baptise M.D.   On: 12/30/2018 20:02   Dg Abd Portable 1v-small Bowel Protocol-position Verification  Result Date: 12/30/2018 CLINICAL DATA:  Nasogastric tube placement EXAM: PORTABLE ABDOMEN - 1 VIEW COMPARISON:  Portable exam 1630 hours compared to earlier CT of 12/30/2018 FINDINGS: Tip of nasogastric tube projects over stomach though the proximal side-port projects over the distal esophagus, recommend advancing tube 7-8 cm. Gaseous distention of bowel loops in the mid abdomen and pelvis. No definite bowel wall thickening. Excreted contrast material within renal collecting systems and bladder. Bones demineralized with dextroconvex thoracolumbar scoliosis and scattered degenerative changes. IMPRESSION: Recommend advancing nasogastric tube 7-8 cm. Electronically Signed   By: Crist Infante.D.  On: 12/30/2018 17:05   Dg Addison Bailey G Tube Plc W/fl W/rad  Result Date: 12/31/2018 CLINICAL DATA:  Requires NG tube placement. EXAM: NASO G TUBE PLACEMENT WITH FL AND WITH RAD CONTRAST:  33mL OMNIPAQUE IOHEXOL 300 MG/ML  SOLN FLUOROSCOPY TIME:  Fluoroscopy Time:  48 seconds Radiation Exposure Index (if provided by the fluoroscopic device): 11.9 mGy Number of Acquired Spot Images: 0 COMPARISON:  None. FINDINGS: The indwelling nasogastric tube was manipulated under direct fluoroscopic guidance a into the proximal duodenum just beyond the pylorus period. Injection of water-soluble contrast material confirms placement of tube tip within the proximal duodenum. IMPRESSION: 1. Successful post pyloric placement of indwelling nasogastric tube. Electronically Signed   By: Kerby Moors M.D.   On: 12/31/2018 13:37     Antimicrobials:   Zosyn 7/7    Subjective: Patient had significant nausea and vomiting yesterday afternoon, Reports some nausea this morning but no vomiting Able to keep breakfast down  Objective: Vitals:   01/08/19 1437 01/08/19 2056 01/09/19 0614 01/09/19 1355  BP: 120/68 130/78 (!) 144/78 124/72  Pulse: 78 74 66 73  Resp: 16 18 18 17   Temp: 98.1 F (36.7 C) 99 F (37.2 C) 99.1 F (37.3 C) 98.5 F (36.9 C)  TempSrc: Oral Oral Oral Oral  SpO2: 98% 96% 97% 98%  Weight:      Height:        Intake/Output Summary (Last 24 hours) at 01/09/2019 1410 Last data filed at 01/09/2019 1354 Gross per 24 hour  Intake 1961.66 ml  Output 3050 ml  Net -1088.34 ml   Filed Weights   12/30/18 1242  Weight: (S) 64.9 kg    Examination:  General exam: Appears calm, mild abdominal discomfort improved from yesterday but still present Respiratory system: Clear to auscultation. Respiratory effort normal. Cardiovascular system: S1 & S2 heard, RRR. No JVD, murmurs, rubs, gallops or clicks. No pedal edema. Gastrointestinal system: Positive bowel sounds, mild abdominal pain no rebound no guarding,  not distended, Central nervous system: Alert and oriented. No focal neurological deficits. Extremities: Warm and well perfused, no contractures moves all 4 extremities freely Skin: No rashes, lesions or ulcers Psychiatry: Judgement and insight appear normal. Mood & affect appropriateappropriate.     Data Reviewed: I have personally reviewed following labs and imaging studies  CBC: Recent Labs  Lab 01/04/19 0318 01/05/19 0445 01/06/19 0342  WBC 8.1 8.5 7.5  HGB 12.2 12.0 11.7*  HCT 38.7 36.1 35.0*  MCV 100.8* 96.8 98.6  PLT 286 295 222   Basic Metabolic Panel: Recent Labs  Lab 01/03/19 0327 01/04/19 0318 01/05/19 0445 01/06/19 0342 01/09/19 0328  NA 137 139 137 134* 137  K 3.2* 3.4* 3.4* 3.8 4.5  CL 103 106 105 104 105  CO2 22 18* 20* 23 23  GLUCOSE 91 77 109* 100* 99  BUN 10 13 8  <5* 9  CREATININE 0.61 0.64 0.54 0.66 0.81  CALCIUM 8.3* 8.4* 8.3* 8.4* 9.4  MG 2.2 2.3  --  1.7 1.9   GFR: Estimated Creatinine Clearance: 49.9 mL/min (by C-G formula based on SCr of 0.81 mg/dL). Liver Function Tests: No results for input(s): AST, ALT, ALKPHOS, BILITOT, PROT, ALBUMIN in the last 168 hours. No results for input(s): LIPASE, AMYLASE in the last 168 hours. No results for input(s): AMMONIA in the last 168 hours. Coagulation Profile: No results for input(s): INR, PROTIME in the last 168 hours. Cardiac Enzymes: No results for input(s): CKTOTAL, CKMB, CKMBINDEX, TROPONINI in the last 168 hours. BNP (  last 3 results) No results for input(s): PROBNP in the last 8760 hours. HbA1C: No results for input(s): HGBA1C in the last 72 hours. CBG: No results for input(s): GLUCAP in the last 168 hours. Lipid Profile: No results for input(s): CHOL, HDL, LDLCALC, TRIG, CHOLHDL, LDLDIRECT in the last 72 hours. Thyroid Function Tests: No results for input(s): TSH, T4TOTAL, FREET4, T3FREE, THYROIDAB in the last 72 hours. Anemia Panel: No results for input(s): VITAMINB12, FOLATE, FERRITIN,  TIBC, IRON, RETICCTPCT in the last 72 hours. Sepsis Labs: No results for input(s): PROCALCITON, LATICACIDVEN in the last 168 hours.  Recent Results (from the past 240 hour(s))  MRSA PCR Screening     Status: None   Collection Time: 12/31/18  8:45 AM   Specimen: Nasal Mucosa; Nasopharyngeal  Result Value Ref Range Status   MRSA by PCR NEGATIVE NEGATIVE Final    Comment:        The GeneXpert MRSA Assay (FDA approved for NASAL specimens only), is one component of a comprehensive MRSA colonization surveillance program. It is not intended to diagnose MRSA infection nor to guide or monitor treatment for MRSA infections. Performed at [facility], [place] [address], [place], [place] [address]   Novel Coronavirus, NAA (hospital order; send-out to ref lab)     Status: None   Collection Time: 01/07/19  9:09 AM   Specimen: Nasopharyngeal Swab; Respiratory  Result Value Ref Range Status   SARS-CoV-2, NAA NOT DETECTED NOT DETECTED Final    Comment: (NOTE) This test was developed and its performance characteristics determined by [employer]. This test has not been FDA cleared or approved. This test has been authorized by FDA under an Emergency Use Authorization (EUA). This test is only authorized for the duration of time the declaration that circumstances exist justifying the authorization of the emergency use of in vitro diagnostic tests for detection of SARS-CoV-2 virus and/or diagnosis of COVID-19 infection under section 564(b)(1) of the Act, 21 U.S.C. 440HKV-4(Q)(5), unless the authorization is terminated or revoked sooner. When diagnostic testing is negative, the possibility of a false negative result should be considered in the context of a patient's recent exposures and the presence of clinical signs and symptoms consistent with COVID-19. An individual without symptoms of COVID-19 and who is not shedding SARS-CoV-2 virus would expect to have a negative (not  detected) result in this assay. Performed  At: [facility] [place], [place] [id number] Rush Farmer MD PP:[id number]    [place]  Final    Comment: Performed at [facility] [address], [place], [place] [address]         Radiology Studies: Dg Abd 1 View  Result Date: 01/08/2019 CLINICAL DATA:  Per order: reported resolved SBO now with significant diarrhea and nausea. Pt had a laparoscopic cholecystectomy in 08/2016. EXAM: ABDOMEN - 1 VIEW COMPARISON:  01/04/2019 FINDINGS: Removal of gastric tube. Cholecystectomy clips. Normal bowel gas pattern. T12 compression deformity, present since 09/09/2018. Mild thoracolumbar scoliosis with multilevel spondylitic change in the lumbar spine. IMPRESSION: Normal bowel gas pattern. Electronically Signed   By: Lucrezia Europe M.D.   On: 01/08/2019 12:39        Scheduled Meds: . diatrizoate meglumine-sodium  90 mL Per NG tube Once  . docusate sodium  100 mg Oral BID  . enoxaparin (LOVENOX) injection  40 mg Subcutaneous Q24H  . pantoprazole  40 mg Oral Daily   Continuous Infusions: . piperacillin-tazobactam (ZOSYN)  IV 3.375 g (01/09/19 0523)  LOS: 10 days    Time spent: 35 min    Nicolette Bang, MD [facility]  If 7PM-7AM, please contact night-coverage  01/09/2019, 2:10 PM

## 2019-01-10 DIAGNOSIS — E871 Hypo-osmolality and hyponatremia: Secondary | ICD-10-CM

## 2019-01-10 LAB — BASIC METABOLIC PANEL
Anion gap: 10 (ref 5–15)
BUN: 17 mg/dL (ref 8–23)
CO2: 26 mmol/L (ref 22–32)
Calcium: 9.7 mg/dL (ref 8.9–10.3)
Chloride: 100 mmol/L (ref 98–111)
Creatinine, Ser: 0.86 mg/dL (ref 0.44–1.00)
GFR calc Af Amer: >60 mL/min (ref >60)
GFR calc non Af Amer: >60 mL/min (ref >60)
Glucose, Bld: 107 mg/dL — ABNORMAL HIGH (ref 70–99)
Potassium: 4.2 mmol/L (ref 3.5–5.1)
Sodium: 136 mmol/L (ref 135–145)

## 2019-01-10 MED ORDER — HYDROCODONE-ACETAMINOPHEN 5-325 MG PO TABS: 2.0000 | ORAL_TABLET | Freq: Four times a day (QID) | ORAL | 0 refills | Status: AC | PRN

## 2019-01-10 MED ORDER — ONDANSETRON HCL 4 MG PO TABS: 4.0000 mg | ORAL_TABLET | Freq: Every day | ORAL | 1 refills | Status: AC | PRN

## 2019-01-10 MED ORDER — LORAZEPAM 0.5 MG PO TABS: 0.5000 mg | ORAL_TABLET | Freq: Four times a day (QID) | ORAL | 0 refills | Status: DC | PRN

## 2019-01-10 NOTE — NC FL2 (Signed)
[place] LEVEL OF CARE SCREENING TOOL     IDENTIFICATION  Patient Name: Ashley Howard Birthdate: Sep 24, 1945 Sex: female Admission Date (Current Location): 12/30/2018  [facility] and [place] Number:  [place] and Address:  [facility],  [place], [place]      Provider Number:    Attending Physician Name and Address:  Marcell Anger*  Relative Name and Phone Number:  Leen Tworek son [phone]    Current Level of Care: Hospital Recommended Level of Care: [place] Prior Approval Number:    Date Approved/Denied:   PASRR Number:    Discharge Plan: Other (Comment)(assisted living facility)    Current Diagnoses: Patient Active Problem List   Diagnosis Date Noted  . Hyponatremia 12/30/2018  . T12 compression fracture ([place]) 12/30/2018  . HCAP (healthcare-associated pneumonia) 12/30/2018  . Anemia   . Rectal bleeding   . SBO (small bowel obstruction) ([place]) 08/21/2016  . Choledocholithiasis 08/21/2016  . Urinary incontinence 08/21/2016  . GERD (gastroesophageal reflux disease) 08/21/2016  . Neuropathic pain 08/21/2016  . Hyperglycemia 08/21/2016  . History of aspiration pneumonia 07/30/2016  . Septic shock ([place])   . Protein-calorie malnutrition, severe 07/20/2016  . Small bowel obstruction ([place]) 07/16/2016  . Dehydration 07/16/2016  . Abnormality of gait 01/04/2013    Orientation RESPIRATION BLADDER Height & Weight     Self, Time, Situation, Place  Normal Continent Weight: (S) 64.9 kg Height:  4\' 11"  (149.9 cm)  BEHAVIORAL SYMPTOMS/MOOD NEUROLOGICAL BOWEL NUTRITION STATUS      Continent Diet  AMBULATORY STATUS COMMUNICATION OF NEEDS Skin   Supervision Verbally Normal                       Personal Care Assistance Level of Assistance  Bathing, Dressing Bathing Assistance: Limited assistance   Dressing Assistance: Limited assistance     Functional Limitations Info              SPECIAL CARE FACTORS FREQUENCY  OT (By licensed OT), PT (By licensed PT)     PT Frequency: 3 times per week OT Frequency: 3 times per week            Contractures Contractures Info: Not present    Additional Factors Info  Code Status, Allergies Code Status Info: full Allergies Info: tape           Current Medications (01/10/2019):  This is the current hospital active medication list Current Facility-Administered Medications  Medication Dose Route Frequency Provider Last Rate Last Dose  . acetaminophen (TYLENOL) tablet 650 mg  650 mg Oral Q6H PRN Jillyn Ledger, PA-C   650 mg at 01/07/19 2101  . alum & mag hydroxide-simeth (MAALOX/MYLANTA) 200-200-20 MG/5ML suspension 15-30 mL  15-30 mL Oral Q6H PRN Maczis, Barth Kirks, PA-C      . diatrizoate meglumine-sodium (GASTROGRAFIN) 66-10 % solution 90 mL  90 mL Per NG tube Once Maczis, Barth Kirks, PA-C      . docusate sodium (COLACE) capsule 100 mg  100 mg Oral BID Jillyn Ledger, PA-C   100 mg at 01/10/19 1016  . enoxaparin (LOVENOX) injection 40 mg  40 mg Subcutaneous Q24H Shelly Coss, MD   40 mg at 01/09/19 2150  . hydrALAZINE (APRESOLINE) injection 5 mg  5 mg Intravenous Q4H PRN Dessa Phi, DO   5 mg at 01/03/19 2116  . lip balm (CARMEX) ointment   Topical PRN Dessa Phi, DO      .  menthol-cetylpyridinium (CEPACOL) lozenge 3 mg  1 lozenge Oral PRN Jillyn Ledger, PA-C      . morphine 2 MG/ML injection 2 mg  2 mg Intravenous Q4H PRN Shelly Coss, MD   2 mg at 12/31/18 1510  . ondansetron (ZOFRAN) injection 4 mg  4 mg Intravenous Q8H PRN Shelly Coss, MD   4 mg at 01/09/19 0955  . ondansetron (ZOFRAN-ODT) disintegrating tablet 4 mg  4 mg Oral Q8H PRN Jillyn Ledger, PA-C   4 mg at 01/10/19 1016  . oxyCODONE (Oxy IR/ROXICODONE) immediate release tablet 5-10 mg  5-10 mg Oral Q4H PRN Maczis, Barth Kirks, PA-C      . pantoprazole (PROTONIX) EC tablet 40 mg  40 mg Oral Daily Jillyn Ledger, PA-C    40 mg at 01/10/19 1016  . phenol (CHLORASEPTIC) mouth spray 1 spray  1 spray Mouth/Throat PRN Jillyn Ledger, PA-C   1 spray at 01/02/19 2000  . piperacillin-tazobactam (ZOSYN) IVPB 3.375 g  3.375 g Intravenous Q8H Dessa Phi, DO   Stopped at 01/10/19 1028     Discharge Medications: Please see discharge summary for a list of discharge medications.  Relevant Imaging Results:  Relevant Lab Results:   Additional Information    Joaquin Courts, RN

## 2019-01-10 NOTE — Progress Notes (Signed)
PIV was removed without difficulty. Pt tolerated well. Discharge instructions reviewed with pt and printed copy provided for pt as well as copy for [facility]. Nursing report was called to [place], [place] at receiving facility. Pt and caregiver deny any questions or concerns

## 2019-01-10 NOTE — Discharge Summary (Signed)
Physician Discharge Summary  Ashley Howard ZDG:[id number] DOB: May 23, 1946 DOA: 12/30/2018  PCP: Lynnell Catalan, FNP  Admit date: 12/30/2018 Discharge date: 01/10/2019  Admitted From: Inpatient Disposition: SNF  Recommendations for Outpatient Follow-up:  1. Follow up with PCP in 1-2 weeks   Home Health:No Equipment/Devices:none  Discharge Condition:Stable CODE STATUS:Full code Diet recommendation: Regular healthy diet  Brief/Interim Summary: Ashley Waidelich McDanielis a 73 y.o.femalewith medical history significantformultiple abdominal surgeries, scoliosis, osteoporosis, compression fracture, tobacco abuse, previous SBO status post explorative laparotomy who presents fromassistedliving facility the emergency department for the evaluation of nausea, vomiting, abdominal pain. She was apparently all right until yesterday evening. After eating her dinner, she started having generalized abdominal pain that was worse in the epigastric region. She describes the pain as cramping, waxing. Her abdomen got progressively distended and she developed nausea and vomiting. She reports multiple episodes of vomiting since then. She reports that she had a small bowel movement in the assisted living facility. Since then she has not passed any flatus or has a bowel movement. Patient also reported of having some fullness in her chest. CT abdomen/pelvis done in the emergency department showed distal small bowel obstruction with transition point over the midline.It also showed bilateral patchy infiltrates suggesting multifocal pneumonia. COVID-19 negative.She has been treated with supportive care with NGT. General surgery following.  Hospital course: Small bowel obstruction.  Patient was seen by general surgery consultation small bowel resolved follow-up KUB was negative patient tolerated diet without complication.  Patient be discharged back to skilled nursing facility today PRN antiemetics Rx  provided  Multifocal pneumonia while in hospital patient was treated for multifocal pneumonia treated with 7 days of Zosyn, white count is normal she is afebrile she completed antibiotics in the hospital no additional work-up necessary, normal white blood cell negative blood cultures  T12 compression fracture old patient continue with PRN analgesics as needed.  Patient reported no pain no difficulty ambulating in the hallways.  She will follow-up with PCP provider as an outpatient for continued management  Hypokalemia.  Patient in no acute arrhythmias, repleted as needed, most recent 4.2 today  Discharge Diagnoses:  Principal Problem:   Small bowel obstruction ([name]) Active Problems:   SBO (small bowel obstruction) ([facility])   Hyponatremia   T12 compression fracture ([place])   HCAP (healthcare-associated pneumonia)    Discharge Instructions  Discharge Instructions    Call MD for:   Complete by: As directed    Any acute change in medical condition   Call MD for:  persistant nausea and vomiting   Complete by: As directed    Call MD for:  redness, tenderness, or signs of infection (pain, swelling, redness, odor or green/yellow discharge around incision site)   Complete by: As directed    Call MD for:  severe uncontrolled pain   Complete by: As directed    Call MD for:  temperature >100.4   Complete by: As directed    Diet - low sodium heart healthy   Complete by: As directed    Increase activity slowly   Complete by: As directed      Allergies as of 01/10/2019      Reactions   Tape Other (See Comments)   Paper tape - blisters      Medication List    TAKE these medications   acetaminophen 500 MG tablet Commonly known as: TYLENOL Take 500 mg by mouth every 8 (eight) hours as needed.   alum & mag hydroxide-simeth 200-200-20 MG/5ML suspension Commonly  known as: MAALOX/MYLANTA Take 30 mLs by mouth every 6 (six) hours as needed for indigestion or heartburn.   Calcium 500 + D3  500-600 MG-UNIT Tabs Generic drug: Calcium Carb-Cholecalciferol Take 1 tablet by mouth 2 (two) times daily. (0800 & 2000)   docusate sodium 100 MG capsule Commonly known as: COLACE Take 100 mg by mouth daily. (0800)   gabapentin 100 MG capsule Commonly known as: NEURONTIN Take 100 mg by mouth at bedtime. (2000)   HYDROcodone-acetaminophen 5-325 MG tablet Commonly known as: Norco Take 2 tablets by mouth every 6 (six) hours as needed for up to 5 days (pain not controlled with ibuprofen or tylenol). What changed:   how much to take  when to take this  reasons to take this   Konsyl Daily Fiber 28.3 % Powd Generic drug: Psyllium Take 10 mLs by mouth daily. Mix in 8 ounces of water   loratadine 10 MG tablet Commonly known as: CLARITIN Take 10 mg by mouth daily.   LORazepam 0.5 MG tablet Commonly known as: ATIVAN Take 1 tablet (0.5 mg total) by mouth every 6 (six) hours as needed for anxiety. Take 0.5 mg by mouth at bedtime. May also take twice daily as needed for anxiety. What changed:   when to take this  reasons to take this   meloxicam 15 MG tablet Commonly known as: MOBIC Take 15 mg by mouth daily. (0800)   multivitamin with minerals Tabs tablet Take 1 tablet by mouth daily. (0800)   Myrbetriq 25 MG Tb24 tablet Generic drug: mirabegron ER Take 25 mg by mouth daily. 0800   omeprazole 20 MG capsule Commonly known as: PRILOSEC Take 20 mg by mouth daily. (0800)   ondansetron 4 MG tablet Commonly known as: Zofran Take 1 tablet (4 mg total) by mouth daily as needed for nausea or vomiting.   risedronate 35 MG tablet Commonly known as: ACTONEL Take 35 mg by mouth every Wednesday. Mix with 8 oz of water. Remain upright and no food/drink for 30 min. Do not crush   simethicone 125 MG chewable tablet Commonly known as: MYLICON Chew 258 mg by mouth 2 (two) times daily. (0800 & 1800)   triamcinolone ointment 0.1 % Commonly known as: KENALOG Apply 1 application  topically 2 (two) times a day.      Follow-up Information    Lynnell Catalan, FNP. Call.   Specialty: Family Medicine Contact information: [place] [address] [phone]        Surgery, [place] Follow up.   Specialty: General Surgery Why: As needed Contact information: [address] [place] [address] [phone]          Allergies  Allergen Reactions  . Tape Other (See Comments)    Paper tape - blisters    Consultations:  gen surg   Procedures/Studies: Dg Abd 1 View  Result Date: 01/08/2019 CLINICAL DATA:  Per order: reported resolved SBO now with significant diarrhea and nausea. Pt had a laparoscopic cholecystectomy in 08/2016. EXAM: ABDOMEN - 1 VIEW COMPARISON:  01/04/2019 FINDINGS: Removal of gastric tube. Cholecystectomy clips. Normal bowel gas pattern. T12 compression deformity, present since 09/09/2018. Mild thoracolumbar scoliosis with multilevel spondylitic change in the lumbar spine. IMPRESSION: Normal bowel gas pattern. Electronically Signed   By: Lucrezia Europe M.D.   On: 01/08/2019 12:39   Ct Abdomen Pelvis W Contrast  Result Date: 12/30/2018 CLINICAL DATA:  Epigastric pain and constipation.  Vomiting. EXAM: CT ABDOMEN AND PELVIS WITH CONTRAST  TECHNIQUE: Multidetector CT imaging of the abdomen and pelvis was performed using the standard protocol following bolus administration of intravenous contrast. CONTRAST:  17mL OMNIPAQUE IOHEXOL 300 MG/ML  SOLN COMPARISON:  08/21/2016 FINDINGS: Lower chest: Lung bases demonstrate patchy airspace process right worse than left likely due to multifocal pneumonia. No effusion. Fluid-filled dilated distal esophagus with fluid-filled moderate size hiatal hernia. Moderate narrowing of the stomach as it passes through the diaphragmatic hiatus. Hepatobiliary: Prior cholecystectomy. Liver is within normal. Biliary tree is unremarkable. Pancreas: Normal. Spleen: Normal. Adrenals/Urinary  Tract: Adrenal glands are normal. Kidneys are normal in size without focal mass or hydronephrosis. Subcentimeter right renal cortical hypodensity too small to characterize but likely a cyst. Ureters and bladder are normal. Stomach/Bowel: Moderate-sized hiatal hernia with narrowing of the hernia as passes through the diaphragmatic hiatus. Multiple fluid-filled dilated small bowel loops with the ileum measuring up to 5.4 cm in diameter. There is an abrupt transition point of the ileum in the midline of the mid to lower abdomen just below the level of the aortic bifurcation with associated whirling of the midline mesentery. Remainder of the distal ileum is decompressed. Small amount of air and stool throughout the colon which is otherwise mildly decompressed. Moderate diverticulosis of the descending and sigmoid colon. Appendix not visualized. Vascular/Lymphatic: Moderate calcified plaque over the abdominal aorta and iliac arteries. No adenopathy. Reproductive: Prior hysterectomy. Other: No free peritoneal air and no significant free fluid. Musculoskeletal: Degenerate changes spine with the level disc disease over the lumbar spine. Stable grade 1 anterolisthesis of L3 on L4 and L4 on L5. Mild stable compression deformities of L2 and L4. Moderate T12 compression fracture age indeterminate but new since the previous exam. IMPRESSION: 1. Distal small bowel obstruction with abrupt transition point over the midline mid to lower abdomen involving the ileum with associated whirling of the mesentery. Small bowel measures up to 5.4 cm in diameter. Obstruction likely due to adhesions. 2. Patchy airspace process over the lung bases right worse than left compatible with multifocal pneumonia. 3. Moderate size hiatal hernia containing fluid with fluid over the distal esophagus and marked narrowing of the hernia as it passes through the diaphragmatic hiatus. Possible degree of obstruction. 4. Moderate T12 compression fracture age  indeterminate but new since the prior exam. Stable mild compression deformities of L2 and L4. 5. Colonic diverticulosis. Subcentimeter right renal cortical hypodensity too small to characterize but likely a cyst. Other postsurgical changes as described. 6.  Aortic Atherosclerosis (ICD10-I70.0). Electronically Signed   By: Marin Olp M.D.   On: 12/30/2018 12:25   Dg Chest Port 1 View  Result Date: 01/03/2019 CLINICAL DATA:  Dyspnea, pneumonia EXAM: PORTABLE CHEST 1 VIEW COMPARISON:  09/07/2017 chest radiograph. FINDINGS: Enteric tube enters stomach with the tip not seen on this image. Stable cardiomediastinal silhouette with normal heart size. No pneumothorax. No pleural effusion. Mild hazy right costophrenic angle opacity. No pulmonary edema. IMPRESSION: 1. Well-positioned enteric tube. 2. Mild hazy right costophrenic angle opacity, cannot exclude pneumonia. Chest radiograph follow-up advised. Electronically Signed   By: Ilona Sorrel M.D.   On: 01/03/2019 11:26   Dg Abd Portable 1v  Result Date: 01/04/2019 CLINICAL DATA:  Small-bowel obstruction. EXAM: PORTABLE ABDOMEN - 1 VIEW COMPARISON:  01/02/2019.  01/01/2019.  CT 12/30/2018 FINDINGS: Surgical clips right upper quadrant. NG tube noted with tip over the stomach. Persistent distended loops of small bowel noted. Oral contrast has continued to transit through the colon. No free air. Vascular calcification. Degenerative changes scoliosis  thoracic spine IMPRESSION: 1.  NG tube noted with tip in the stomach. 2. Persistent distended loops of small bowel noted. Oral contrast is continue to transit through the colon. No free air. Electronically Signed   By: Marcello Moores  Register   On: 01/04/2019 07:39   Dg Abd Portable 1v  Result Date: 01/02/2019 CLINICAL DATA:  Small bowel obstruction EXAM: PORTABLE ABDOMEN - 1 VIEW COMPARISON:  Abdominal radiograph from 1 day prior FINDINGS: Enteric tube terminates in the distal stomach. Cholecystectomy clips are seen in the  right upper quadrant of the abdomen. Stable mildly dilated small bowel loops in the left upper abdomen. Retained oral contrast throughout the colon transiting to the rectum. No evidence of pneumatosis or pneumoperitoneum. IMPRESSION: 1. Enteric tube tip is in the distal stomach. 2. Stable mildly dilated small bowel loops in the left upper abdomen. Retained oral contrast throughout the colon transiting to the rectum. Findings suggest resolving small bowel obstruction. Electronically Signed   By: Ilona Sorrel M.D.   On: 01/02/2019 08:48   Dg Abd Portable 1v  Result Date: 01/01/2019 CLINICAL DATA:  Small-bowel obstruction. EXAM: PORTABLE ABDOMEN - 1 VIEW COMPARISON:  12/31/2018. FINDINGS: NG tube noted coiled stomach. Improvement of small-bowel distention. Oral contrast throughout the colon on today's exam. Degenerative changes scoliosis lumbar spine. Stable upper lumbar compression fracture. Degenerative change both hips. IMPRESSION: NG tube noted coiled stomach. Improvement of small-bowel distention. Oral contrast noted throughout the colon on today's exam. No free air. Electronically Signed   By: Marcello Moores  Register   On: 01/01/2019 07:18   Dg Abd Portable 1v-small Bowel Obstruction Protocol-initial, 8 Hr Delay  Result Date: 12/31/2018 CLINICAL DATA:  Small bowel protocol 8 hour delay EXAM: PORTABLE ABDOMEN - 1 VIEW COMPARISON:  Six 03/02/2019, 12/30/2018, CT 12/30/2018 FINDINGS: Esophageal tube tip overlies the proximal duodenum. Little change in multiple loops of dilated small bowel, with prominent right lower quadrant bowel loop measuring up to 7.5 cm. Some contrast in the right upper quadrant is presumably within the hepatic flexure of the colon. IMPRESSION: 1. Little interval change in multiple loops of dilated small consistent with a bowel obstruction. A small amount of contrast is visible in the right colon. Electronically Signed   By: Donavan Foil M.D.   On: 12/31/2018 22:35   Dg Abd Portable  1v  Result Date: 12/30/2018 CLINICAL DATA:  NG tube repositioned EXAM: PORTABLE ABDOMEN - 1 VIEW COMPARISON:  None. FINDINGS: NG tube again noted coiling in the lower chest, likely within the hiatal hernia. IMPRESSION: NG tube coils in the lower chest, likely in the hiatal hernia. Electronically Signed   By: Rolm Baptise M.D.   On: 12/30/2018 20:02   Dg Abd Portable 1v-small Bowel Protocol-position Verification  Result Date: 12/30/2018 CLINICAL DATA:  Nasogastric tube placement EXAM: PORTABLE ABDOMEN - 1 VIEW COMPARISON:  Portable exam 1630 hours compared to earlier CT of 12/30/2018 FINDINGS: Tip of nasogastric tube projects over stomach though the proximal side-port projects over the distal esophagus, recommend advancing tube 7-8 cm. Gaseous distention of bowel loops in the mid abdomen and pelvis. No definite bowel wall thickening. Excreted contrast material within renal collecting systems and bladder. Bones demineralized with dextroconvex thoracolumbar scoliosis and scattered degenerative changes. IMPRESSION: Recommend advancing nasogastric tube 7-8 cm. Electronically Signed   By: Lavonia Dana M.D.   On: 12/30/2018 17:05   Dg Addison Bailey G Tube Plc W/fl W/rad  Result Date: 12/31/2018 CLINICAL DATA:  Requires NG tube placement. EXAM: NASO G TUBE PLACEMENT WITH  FL AND WITH RAD CONTRAST:  85mL OMNIPAQUE IOHEXOL 300 MG/ML  SOLN FLUOROSCOPY TIME:  Fluoroscopy Time:  48 seconds Radiation Exposure Index (if provided by the fluoroscopic device): 11.9 mGy Number of Acquired Spot Images: 0 COMPARISON:  None. FINDINGS: The indwelling nasogastric tube was manipulated under direct fluoroscopic guidance a into the proximal duodenum just beyond the pylorus period. Injection of water-soluble contrast material confirms placement of tube tip within the proximal duodenum. IMPRESSION: 1. Successful post pyloric placement of indwelling nasogastric tube. Electronically Signed   By: Kerby Moors M.D.   On: 12/31/2018 13:37        Subjective: No acute events resting in bed comfortably. She reported to staff that she likes it here she feels "being spoiled", this led to some resistance to being discharged although patient stable.  Discharge Exam: Vitals:   01/09/19 2010 01/10/19 0508  BP: 115/74 123/79  Pulse: 69 68  Resp: 18 20  Temp: (!) 97.4 F (36.3 C) 98.3 F (36.8 C)  SpO2: 98% 96%   Vitals:   01/09/19 0614 01/09/19 1355 01/09/19 2010 01/10/19 0508  BP: (!) 144/78 124/72 115/74 123/79  Pulse: 66 73 69 68  Resp: 18 17 18 20   Temp: 99.1 F (37.3 C) 98.5 F (36.9 C) (!) 97.4 F (36.3 C) 98.3 F (36.8 C)  TempSrc: Oral Oral Oral Oral  SpO2: 97% 98% 98% 96%  Weight:      Height:        General: Pt is alert, awake, not in acute distress Cardiovascular: RRR, S1/S2 +, no rubs, no gallops Respiratory: CTA bilaterally, no wheezing, no rhonchi Abdominal: Soft, NT, ND, bowel sounds + Extremities: no edema, no cyanosis    The results of significant diagnostics from this hospitalization (including imaging, microbiology, ancillary and laboratory) are listed below for reference.     Microbiology: Recent Results (from the past 240 hour(s))  Novel Coronavirus, NAA (hospital order; send-out to ref lab)     Status: None   Collection Time: 01/07/19  9:09 AM   Specimen: Nasopharyngeal Swab; Respiratory  Result Value Ref Range Status   SARS-CoV-2, NAA NOT DETECTED NOT DETECTED Final    Comment: (NOTE) This test was developed and its performance characteristics determined by [employer]. This test has not been FDA cleared or approved. This test has been authorized by FDA under an Emergency Use Authorization (EUA). This test is only authorized for the duration of time the declaration that circumstances exist justifying the authorization of the emergency use of in vitro diagnostic tests for detection of SARS-CoV-2 virus and/or diagnosis of COVID-19 infection under section 564(b)(1) of  the Act, 21 U.S.C. 532DJM-4(Q)(6), unless the authorization is terminated or revoked sooner. When diagnostic testing is negative, the possibility of a false negative result should be considered in the context of a patient's recent exposures and the presence of clinical signs and symptoms consistent with COVID-19. An individual without symptoms of COVID-19 and who is not shedding SARS-CoV-2 virus would expect to have a negative (not detected) result in this assay. Performed  At: [facility] [place], [place] [id number] Rush Farmer MD LN:[id number]    [place]  Final    Comment: Performed at [place] [address] [place], [place] [address]     Labs: BNP (last 3 results) No results for input(s): BNP in the last 8760 hours. Basic Metabolic Panel: Recent Labs  Lab 01/04/19 0318 01/05/19 0445 01/06/19 0342 01/09/19 0328 01/10/19 0354  NA 139 137 134* 137 136  K 3.4* 3.4* 3.8 4.5 4.2  CL 106 105 104 105 100  CO2 18* 20* 23 23 26   GLUCOSE 77 109* 100* 99 107*  BUN 13 8 <5* 9 17  CREATININE 0.64 0.54 0.66 0.81 0.86  CALCIUM 8.4* 8.3* 8.4* 9.4 9.7  MG 2.3  --  1.7 1.9  --    Liver Function Tests: No results for input(s): AST, ALT, ALKPHOS, BILITOT, PROT, ALBUMIN in the last 168 hours. No results for input(s): LIPASE, AMYLASE in the last 168 hours. No results for input(s): AMMONIA in the last 168 hours. CBC: Recent Labs  Lab 01/04/19 0318 01/05/19 0445 01/06/19 0342  WBC 8.1 8.5 7.5  HGB 12.2 12.0 11.7*  HCT 38.7 36.1 35.0*  MCV 100.8* 96.8 98.6  PLT 286 295 276   Cardiac Enzymes: No results for input(s): CKTOTAL, CKMB, CKMBINDEX, TROPONINI in the last 168 hours. BNP: Invalid input(s): POCBNP CBG: No results for input(s): GLUCAP in the last 168 hours. D-Dimer No results for input(s): DDIMER in the last 72 hours. Hgb A1c No results for input(s): HGBA1C in the last 72 hours. Lipid  Profile No results for input(s): CHOL, HDL, LDLCALC, TRIG, CHOLHDL, LDLDIRECT in the last 72 hours. Thyroid function studies No results for input(s): TSH, T4TOTAL, T3FREE, THYROIDAB in the last 72 hours.  Invalid input(s): FREET3 Anemia work up No results for input(s): VITAMINB12, FOLATE, FERRITIN, TIBC, IRON, RETICCTPCT in the last 72 hours. Urinalysis    Component Value Date/Time   COLORURINE YELLOW 12/30/2018 1446   APPEARANCEUR CLEAR 12/30/2018 1446   LABSPEC >1.046 (H) 12/30/2018 1446   PHURINE 6.0 12/30/2018 1446   GLUCOSEU NEGATIVE 12/30/2018 1446   HGBUR NEGATIVE 12/30/2018 1446   BILIRUBINUR NEGATIVE 12/30/2018 1446   KETONESUR NEGATIVE 12/30/2018 1446   PROTEINUR NEGATIVE 12/30/2018 1446   UROBILINOGEN 0.2 06/27/2016 1216   NITRITE NEGATIVE 12/30/2018 1446   LEUKOCYTESUR NEGATIVE 12/30/2018 1446   Sepsis Labs Invalid input(s): PROCALCITONIN,  WBC,  LACTICIDVEN Microbiology Recent Results (from the past 240 hour(s))  Novel Coronavirus, NAA (hospital order; send-out to ref lab)     Status: None   Collection Time: 01/07/19  9:09 AM   Specimen: Nasopharyngeal Swab; Respiratory  Result Value Ref Range Status   SARS-CoV-2, NAA NOT DETECTED NOT DETECTED Final    Comment: (NOTE) This test was developed and its performance characteristics determined by [employer]. This test has not been FDA cleared or approved. This test has been authorized by FDA under an Emergency Use Authorization (EUA). This test is only authorized for the duration of time the declaration that circumstances exist justifying the authorization of the emergency use of in vitro diagnostic tests for detection of SARS-CoV-2 virus and/or diagnosis of COVID-19 infection under section 564(b)(1) of the Act, 21 U.S.C. 606TKZ-6(W)(1), unless the authorization is terminated or revoked sooner. When diagnostic testing is negative, the possibility of a false negative result should be considered in the  context of a patient's recent exposures and the presence of clinical signs and symptoms consistent with COVID-19. An individual without symptoms of COVID-19 and who is not shedding SARS-CoV-2 virus would expect to have a negative (not detected) result in this assay. Performed  At: [facility] [place], [place] [id number] Rush Farmer MD UK:[id number]    [place]  Final    Comment: Performed at [facility] [address] [place], [place] [address]     Time coordinating discharge: Over  30 minutes  SIGNED:   Nicolette Bang, MD  [facility] 01/10/2019, 10:37 AM Pager   If 7PM-7AM, please contact night-coverage [email or web address] Password [email or web address]

## 2019-01-10 NOTE — TOC Progression Note (Addendum)
Transition of Care [facility]) - Progression Note    Patient Details  Name: Ashley Howard MRN: [id number] Date of Birth: 01-03-1946  Transition of Care [facility]) CM/SW Contact  Milianna Ericsson, [place], [place] Phone Number: 01/10/2019, 12:46 PM  Clinical Narrative:    Patient to return back to [facility] today. Patient requests to return by taxi cab. Fl2 completed and faxed to [facility] along with requested labs and discharge summary to Boneta Lucks [phone] Patient to receive [facility] PT/OT through [facility] RN to call in report to [facility] [phone].  Late entry-multiple attempts made to fax documents needed to [facility] confirmation that it was received, however Boneta Lucks states that it was not received. "It may be on our end". Documents placed in a sealed envelope and sent home with patient to give to Boneta Lucks upon her return to [facility].  Expected Discharge Plan: Assisted Living Barriers to Discharge: Continued Medical Work up  Expected Discharge Plan and Services Expected Discharge Plan: Assisted Living In-house Referral: Clinical Social Work Discharge Planning Services: CM Consult Post Acute Care Choice: [place] arrangements for the past 2 months: [place] Expected Discharge Date: 01/10/19                                     Social Determinants of Health (SDOH) Interventions    Readmission Risk Interventions No flowsheet data found.

## 2019-01-18 DIAGNOSIS — G894 Chronic pain syndrome: Secondary | ICD-10-CM | POA: Diagnosis not present

## 2019-01-20 DIAGNOSIS — Z03818 Encounter for observation for suspected exposure to other biological agents ruled out: Secondary | ICD-10-CM | POA: Diagnosis not present

## 2019-01-25 DIAGNOSIS — K5900 Constipation, unspecified: Secondary | ICD-10-CM | POA: Diagnosis not present

## 2019-01-25 DIAGNOSIS — K219 Gastro-esophageal reflux disease without esophagitis: Secondary | ICD-10-CM | POA: Diagnosis not present

## 2019-01-25 DIAGNOSIS — N3281 Overactive bladder: Secondary | ICD-10-CM | POA: Diagnosis not present

## 2019-01-25 DIAGNOSIS — G9009 Other idiopathic peripheral autonomic neuropathy: Secondary | ICD-10-CM | POA: Diagnosis not present

## 2019-02-08 DIAGNOSIS — Z03818 Encounter for observation for suspected exposure to other biological agents ruled out: Secondary | ICD-10-CM | POA: Diagnosis not present

## 2019-02-15 DIAGNOSIS — B351 Tinea unguium: Secondary | ICD-10-CM | POA: Diagnosis not present

## 2019-02-15 DIAGNOSIS — I739 Peripheral vascular disease, unspecified: Secondary | ICD-10-CM | POA: Diagnosis not present

## 2019-02-15 DIAGNOSIS — L603 Nail dystrophy: Secondary | ICD-10-CM | POA: Diagnosis not present

## 2019-02-15 DIAGNOSIS — Q845 Enlarged and hypertrophic nails: Secondary | ICD-10-CM | POA: Diagnosis not present

## 2019-02-17 ENCOUNTER — Emergency Department (HOSPITAL_COMMUNITY): Payer: Medicare Other

## 2019-02-17 ENCOUNTER — Inpatient Hospital Stay (HOSPITAL_COMMUNITY): Payer: Medicare Other

## 2019-02-17 ENCOUNTER — Inpatient Hospital Stay (HOSPITAL_COMMUNITY)
Admission: EM | Admit: 2019-02-17 | Discharge: 2019-02-23 | DRG: 336 | Disposition: A | Discharge status: Skilled Nursing Facility | Payer: Medicare Other | Source: Skilled Nursing Facility | Attending: Family Medicine | Admitting: Family Medicine

## 2019-02-17 ENCOUNTER — Encounter (HOSPITAL_COMMUNITY): Payer: Self-pay

## 2019-02-17 ENCOUNTER — Other Ambulatory Visit: Payer: Self-pay

## 2019-02-17 DIAGNOSIS — M545 Low back pain: Secondary | ICD-10-CM | POA: Diagnosis present

## 2019-02-17 DIAGNOSIS — Z8249 Family history of ischemic heart disease and other diseases of the circulatory system: Secondary | ICD-10-CM

## 2019-02-17 DIAGNOSIS — R1111 Vomiting without nausea: Secondary | ICD-10-CM | POA: Diagnosis not present

## 2019-02-17 DIAGNOSIS — K8050 Calculus of bile duct without cholangitis or cholecystitis without obstruction: Secondary | ICD-10-CM | POA: Diagnosis not present

## 2019-02-17 DIAGNOSIS — Z8719 Personal history of other diseases of the digestive system: Secondary | ICD-10-CM

## 2019-02-17 DIAGNOSIS — G629 Polyneuropathy, unspecified: Secondary | ICD-10-CM | POA: Diagnosis not present

## 2019-02-17 DIAGNOSIS — R1084 Generalized abdominal pain: Secondary | ICD-10-CM | POA: Diagnosis not present

## 2019-02-17 DIAGNOSIS — Z20822 Contact with and (suspected) exposure to covid-19: Secondary | ICD-10-CM

## 2019-02-17 DIAGNOSIS — Z79899 Other long term (current) drug therapy: Secondary | ICD-10-CM

## 2019-02-17 DIAGNOSIS — M7989 Other specified soft tissue disorders: Secondary | ICD-10-CM | POA: Diagnosis not present

## 2019-02-17 DIAGNOSIS — Z89432 Acquired absence of left foot: Secondary | ICD-10-CM

## 2019-02-17 DIAGNOSIS — M419 Scoliosis, unspecified: Secondary | ICD-10-CM | POA: Diagnosis present

## 2019-02-17 DIAGNOSIS — N390 Urinary tract infection, site not specified: Secondary | ICD-10-CM | POA: Diagnosis present

## 2019-02-17 DIAGNOSIS — Z9049 Acquired absence of other specified parts of digestive tract: Secondary | ICD-10-CM | POA: Diagnosis not present

## 2019-02-17 DIAGNOSIS — G8929 Other chronic pain: Secondary | ICD-10-CM | POA: Diagnosis present

## 2019-02-17 DIAGNOSIS — K56609 Unspecified intestinal obstruction, unspecified as to partial versus complete obstruction: Secondary | ICD-10-CM | POA: Diagnosis present

## 2019-02-17 DIAGNOSIS — N3289 Other specified disorders of bladder: Secondary | ICD-10-CM | POA: Diagnosis present

## 2019-02-17 DIAGNOSIS — R32 Unspecified urinary incontinence: Secondary | ICD-10-CM | POA: Diagnosis present

## 2019-02-17 DIAGNOSIS — K5730 Diverticulosis of large intestine without perforation or abscess without bleeding: Secondary | ICD-10-CM | POA: Diagnosis not present

## 2019-02-17 DIAGNOSIS — Z23 Encounter for immunization: Secondary | ICD-10-CM | POA: Diagnosis not present

## 2019-02-17 DIAGNOSIS — K565 Intestinal adhesions [bands], unspecified as to partial versus complete obstruction: Secondary | ICD-10-CM | POA: Diagnosis not present

## 2019-02-17 DIAGNOSIS — E876 Hypokalemia: Secondary | ICD-10-CM | POA: Diagnosis not present

## 2019-02-17 DIAGNOSIS — K219 Gastro-esophageal reflux disease without esophagitis: Secondary | ICD-10-CM | POA: Diagnosis not present

## 2019-02-17 DIAGNOSIS — Z7983 Long term (current) use of bisphosphonates: Secondary | ICD-10-CM | POA: Diagnosis not present

## 2019-02-17 DIAGNOSIS — Z20828 Contact with and (suspected) exposure to other viral communicable diseases: Secondary | ICD-10-CM | POA: Diagnosis present

## 2019-02-17 DIAGNOSIS — Z90710 Acquired absence of both cervix and uterus: Secondary | ICD-10-CM | POA: Diagnosis not present

## 2019-02-17 DIAGNOSIS — R110 Nausea: Secondary | ICD-10-CM | POA: Diagnosis not present

## 2019-02-17 DIAGNOSIS — Z791 Long term (current) use of non-steroidal anti-inflammatories (NSAID): Secondary | ICD-10-CM | POA: Diagnosis not present

## 2019-02-17 DIAGNOSIS — E871 Hypo-osmolality and hyponatremia: Secondary | ICD-10-CM | POA: Diagnosis not present

## 2019-02-17 DIAGNOSIS — N736 Female pelvic peritoneal adhesions (postinfective): Secondary | ICD-10-CM | POA: Diagnosis not present

## 2019-02-17 DIAGNOSIS — Z03818 Encounter for observation for suspected exposure to other biological agents ruled out: Secondary | ICD-10-CM | POA: Diagnosis not present

## 2019-02-17 DIAGNOSIS — Z87891 Personal history of nicotine dependence: Secondary | ICD-10-CM | POA: Diagnosis not present

## 2019-02-17 DIAGNOSIS — K5651 Intestinal adhesions [bands], with partial obstruction: Principal | ICD-10-CM | POA: Diagnosis present

## 2019-02-17 DIAGNOSIS — D649 Anemia, unspecified: Secondary | ICD-10-CM | POA: Diagnosis not present

## 2019-02-17 DIAGNOSIS — Z978 Presence of other specified devices: Secondary | ICD-10-CM

## 2019-02-17 DIAGNOSIS — Z931 Gastrostomy status: Secondary | ICD-10-CM | POA: Diagnosis not present

## 2019-02-17 DIAGNOSIS — Z4682 Encounter for fitting and adjustment of non-vascular catheter: Secondary | ICD-10-CM | POA: Diagnosis not present

## 2019-02-17 LAB — CBC WITH DIFFERENTIAL/PLATELET
Abs Immature Granulocytes: 0.08 10*3/uL — ABNORMAL HIGH (ref 0.00–0.07)
Basophils Absolute: 0.1 10*3/uL (ref 0.0–0.1)
Basophils Relative: 0 %
Eosinophils Absolute: 0.1 10*3/uL (ref 0.0–0.5)
Eosinophils Relative: 0 %
HCT: 43.8 % (ref 36.0–46.0)
Hemoglobin: 14.8 g/dL (ref 12.0–15.0)
Immature Granulocytes: 1 %
Lymphocytes Relative: 6 %
Lymphs Abs: 0.9 10*3/uL (ref 0.7–4.0)
MCH: 33.1 pg (ref 26.0–34.0)
MCHC: 33.8 g/dL (ref 30.0–36.0)
MCV: 98 fL (ref 80.0–100.0)
Monocytes Absolute: 0.6 10*3/uL (ref 0.1–1.0)
Monocytes Relative: 4 %
Neutro Abs: 13.6 10*3/uL — ABNORMAL HIGH (ref 1.7–7.7)
Neutrophils Relative %: 89 %
Platelets: 308 10*3/uL (ref 150–400)
RBC: 4.47 MIL/uL (ref 3.87–5.11)
RDW: 12.2 % (ref 11.5–15.5)
WBC: 15.3 10*3/uL — ABNORMAL HIGH (ref 4.0–10.5)
nRBC: 0 % (ref 0.0–0.2)

## 2019-02-17 LAB — COMPREHENSIVE METABOLIC PANEL
ALT: 17 U/L (ref 0–44)
AST: 25 U/L (ref 15–41)
Albumin: 4.3 g/dL (ref 3.5–5.0)
Alkaline Phosphatase: 75 U/L (ref 38–126)
Anion gap: 14 (ref 5–15)
BUN: 18 mg/dL (ref 8–23)
CO2: 21 mmol/L — ABNORMAL LOW (ref 22–32)
Calcium: 10.2 mg/dL (ref 8.9–10.3)
Chloride: 96 mmol/L — ABNORMAL LOW (ref 98–111)
Creatinine, Ser: 0.97 mg/dL (ref 0.44–1.00)
GFR calc Af Amer: >60 mL/min (ref >60)
GFR calc non Af Amer: 57 mL/min — ABNORMAL LOW (ref >60)
Glucose, Bld: 133 mg/dL — ABNORMAL HIGH (ref 70–99)
Potassium: 4.3 mmol/L (ref 3.5–5.1)
Sodium: 131 mmol/L — ABNORMAL LOW (ref 135–145)
Total Bilirubin: 0.6 mg/dL (ref 0.3–1.2)
Total Protein: 7.5 g/dL (ref 6.5–8.1)

## 2019-02-17 LAB — PROTIME-INR
INR: 0.9 (ref 0.8–1.2)
Prothrombin Time: 11.8 seconds (ref 11.4–15.2)

## 2019-02-17 LAB — URINALYSIS, ROUTINE W REFLEX MICROSCOPIC
Bilirubin Urine: NEGATIVE
Glucose, UA: NEGATIVE mg/dL
Hgb urine dipstick: NEGATIVE
Ketones, ur: 5 mg/dL — AB
Leukocytes,Ua: NEGATIVE
Nitrite: NEGATIVE
Protein, ur: NEGATIVE mg/dL
Specific Gravity, Urine: >1.046 — ABNORMAL HIGH (ref 1.005–1.030)
pH: 5 (ref 5.0–8.0)

## 2019-02-17 LAB — LIPASE, BLOOD: Lipase: 33 U/L (ref 11–51)

## 2019-02-17 LAB — TROPONIN I (HIGH SENSITIVITY): Troponin I (High Sensitivity): 6 ng/L (ref <18)

## 2019-02-17 LAB — LACTIC ACID, PLASMA: Lactic Acid, Venous: 1.6 mmol/L (ref 0.5–1.9)

## 2019-02-17 LAB — SARS CORONAVIRUS 2 BY RT PCR (HOSPITAL ORDER, PERFORMED IN ~~LOC~~ HOSPITAL LAB): SARS Coronavirus 2: NEGATIVE

## 2019-02-17 MED ORDER — SODIUM CHLORIDE 0.9 % IV SOLN
INTRAVENOUS | Status: AC
  Administered 2019-02-18: via INTRAVENOUS

## 2019-02-17 MED ORDER — SODIUM CHLORIDE (PF) 0.9 % IJ SOLN
INTRAMUSCULAR | Status: AC
  Filled 2019-02-17: qty 50

## 2019-02-17 MED ORDER — SODIUM CHLORIDE 0.9 % IV SOLN
1.0000 g | Freq: Every day | INTRAVENOUS | Status: DC
  Administered 2019-02-18 – 2019-02-19 (×2): 1 g via INTRAVENOUS
  Filled 2019-02-17: qty 10
  Filled 2019-02-17 (×2): qty 1
  Filled 2019-02-17: qty 10

## 2019-02-17 MED ORDER — ENOXAPARIN SODIUM 40 MG/0.4ML ~~LOC~~ SOLN
40.0000 mg | SUBCUTANEOUS | Status: DC
  Administered 2019-02-17 – 2019-02-22 (×6): 40 mg via SUBCUTANEOUS
  Filled 2019-02-17 (×7): qty 0.4

## 2019-02-17 MED ORDER — ONDANSETRON HCL 4 MG/2ML IJ SOLN
4.0000 mg | Freq: Once | INTRAMUSCULAR | Status: AC
  Administered 2019-02-17: 20:00:00 4 mg via INTRAVENOUS
  Filled 2019-02-17: qty 2

## 2019-02-17 MED ORDER — ONDANSETRON HCL 4 MG/2ML IJ SOLN
4.0000 mg | Freq: Four times a day (QID) | INTRAMUSCULAR | Status: DC | PRN
  Administered 2019-02-18 – 2019-02-19 (×5): 4 mg via INTRAVENOUS
  Filled 2019-02-17 (×6): qty 2

## 2019-02-17 MED ORDER — PANTOPRAZOLE SODIUM 40 MG IV SOLR
40.0000 mg | Freq: Every day | INTRAVENOUS | Status: DC
  Administered 2019-02-17 – 2019-02-21 (×5): 40 mg via INTRAVENOUS
  Filled 2019-02-17 (×5): qty 40

## 2019-02-17 MED ORDER — SODIUM CHLORIDE 0.9 % IV BOLUS
1000.0000 mL | Freq: Once | INTRAVENOUS | Status: AC
  Administered 2019-02-17: 18:00:00 1000 mL via INTRAVENOUS

## 2019-02-17 MED ORDER — IOHEXOL 300 MG/ML  SOLN
100.0000 mL | Freq: Once | INTRAMUSCULAR | Status: AC | PRN
  Administered 2019-02-17: 19:00:00 100 mL via INTRAVENOUS

## 2019-02-17 MED ORDER — PROMETHAZINE HCL 25 MG/ML IJ SOLN
6.2500 mg | Freq: Four times a day (QID) | INTRAMUSCULAR | Status: DC | PRN
  Administered 2019-02-18 (×2): 6.25 mg via INTRAVENOUS
  Filled 2019-02-17 (×2): qty 1

## 2019-02-17 MED ORDER — ACETAMINOPHEN 325 MG PO TABS: 650.0000 mg | ORAL_TABLET | Freq: Four times a day (QID) | ORAL | Status: DC | PRN

## 2019-02-17 MED ORDER — MORPHINE SULFATE (PF) 2 MG/ML IV SOLN
1.0000 mg | INTRAVENOUS | Status: DC | PRN
  Administered 2019-02-18 – 2019-02-19 (×6): 1 mg via INTRAVENOUS
  Filled 2019-02-17 (×6): qty 1

## 2019-02-17 MED ORDER — MORPHINE SULFATE (PF) 4 MG/ML IV SOLN
4.0000 mg | Freq: Once | INTRAVENOUS | Status: AC
  Administered 2019-02-17: 18:00:00 4 mg via INTRAVENOUS
  Filled 2019-02-17: qty 1

## 2019-02-17 MED ORDER — LORAZEPAM 2 MG/ML IJ SOLN: 0.5000 mg | Freq: Four times a day (QID) | INTRAMUSCULAR | Status: DC | PRN

## 2019-02-17 MED ORDER — ACETAMINOPHEN 650 MG RE SUPP: 650.0000 mg | Freq: Four times a day (QID) | RECTAL | Status: DC | PRN

## 2019-02-17 MED ORDER — ONDANSETRON HCL 4 MG/2ML IJ SOLN
4.0000 mg | Freq: Once | INTRAMUSCULAR | Status: AC
  Administered 2019-02-17: 18:00:00 4 mg via INTRAVENOUS
  Filled 2019-02-17: qty 2

## 2019-02-17 NOTE — Consult Note (Signed)
Reason for Consult:SBO Referring Physician: Dr Jani Gravel  Ashley Howard is an 73 y.o. female.  HPI: 73 y.o. F with h/o multiple abdominal surgeries presents to the ED with pain and vomiting that started this am.  She has been hospitalized for this before and resolved with medical management.  Last abd surgery was ex lap for SBO in 2018.  Past Medical History:  Diagnosis Date  . Anemia   . Arthritis    "qwhere" (08/21/2016)  . Aspiration pneumonia ([place]) 07/2016  . Bowel incontinence   . Chronic back pain   . Chronic bronchitis ([place])   . Chronic low back pain   . Fluid retention in legs    wears compression stocking R leg  . Gait abnormality   . Gallstones   . GERD (gastroesophageal reflux disease)   . H/O hiatal hernia   . H/O small bowel obstruction 07/2016   admitted to [facility] 1/2 -07/24/16 with SBO which caused aspiration pneumonia and septic shock/notes 08/21/2016  . Hearing loss    mild  . Hemorrhoid   . History of blood transfusion 1975   "had a reaction"  . Incontinence of urine    wears adult briefs  . Pain, dental   . PONV (postoperative nausea and vomiting)    "doesn't bother me so bad anymore" (08/21/2016)  . Poor historian   . Protein calorie malnutrition ([place])   . Scoliosis   . Scoliosis   . Seasonal allergies   . Shortness of breath    with pain  . Small bowel obstruction ([place]) 08/21/2016  . Status post partial amputation of foot, left ([place])   . Weight loss, unintentional    40 lbs in last year    Past Surgical History:  Procedure Laterality Date  . ABDOMINAL HYSTERECTOMY  07/1973   "still have one of my ovaries"  . AMPUTATION  11/07/2011   Procedure: AMPUTATION RAY;  Surgeon: Newt Minion, MD;  Location: [facility];  Service: Orthopedics;  Laterality: Left;  Left Foot 1st Ray Amputation  . APPENDECTOMY    . BLADDER REPAIR  ~ 02/1974   "hole in my bladder & a place that hadn't healed up from 07/1973 OR for hysterectomy"  . BREAST LUMPECTOMY Bilateral 07/1987   "benign"  . CATARACT EXTRACTION W/ INTRAOCULAR LENS  IMPLANT, BILATERAL Bilateral   . CHOLECYSTECTOMY N/A 08/26/2016   Procedure: LAPAROSCOPIC CHOLECYSTECTOMY;  Surgeon: Georganna Skeans, MD;  Location: [facility];  Service: General;  Laterality: N/A;  . COLONOSCOPY WITH PROPOFOL N/A 07/18/2017   Procedure: COLONOSCOPY WITH PROPOFOL;  Surgeon: Doran Stabler, MD;  Location: [facility];  Service: Gastroenterology;  Laterality: N/A;  . DOBUTAMINE STRESS ECHO    . ERCP N/A 08/24/2016   Procedure: ENDOSCOPIC RETROGRADE CHOLANGIOPANCREATOGRAPHY (ERCP);  Surgeon: Milus Banister, MD;  Location: [facility];  Service: Endoscopy;  Laterality: N/A;  . EXPLORATORY LAPAROTOMY  07/1973   "to straighten out my guts 7 days after hysterectomy"  . KNEE ARTHROSCOPY Left   . LAPAROSCOPIC LYSIS OF ADHESIONS N/A 08/26/2016   Procedure: LAPAROSCOPIC LYSIS OF ADHESIONS FOR 20 MINUTES;  Surgeon: Georganna Skeans, MD;  Location: [facility];  Service: General;  Laterality: N/A;  . LESION REMOVAL N/A 10/02/2017   Procedure: EXCISION OF ANAL POLYPOID LESION;  Surgeon: Ileana Roup, MD;  Location: [facility];  Service: General;  Laterality: N/A;  . RECTAL EXAM UNDER ANESTHESIA N/A 10/02/2017   Procedure: RECTAL EXAM UNDER ANESTHESIA;  Surgeon: Ileana Roup, MD;  Location: [facility];  Service: General;  Laterality: N/A;  . TONSILLECTOMY AND ADENOIDECTOMY      Family History  Problem Relation Age of Onset  . Hypertension Mother   . Cancer Father   . Diabetes Neg Hx   . Stroke Neg Hx     Social History:  reports that she quit smoking about 2 years ago. Her smoking use included cigarettes. She has a 54.00 pack-year smoking history. She has never used smokeless tobacco. She reports that she does not drink alcohol or use drugs.  Allergies:  Allergies  Allergen Reactions  . Tape Other (See Comments)    Paper tape - blisters    Medications: I have reviewed the patient's current medications.  Results for orders placed  or performed during the hospital encounter of 02/17/19 (from the past 48 hour(s))  SARS Coronavirus 2 [facility] order, Performed in [facility] hospital lab) Nasopharyngeal Nasopharyngeal Swab     Status: None   Collection Time: 02/17/19  3:56 PM   Specimen: Nasopharyngeal Swab  Result Value Ref Range   SARS Coronavirus 2 NEGATIVE NEGATIVE    Comment: (NOTE) If result is NEGATIVE SARS-CoV-2 target nucleic acids are NOT DETECTED. The SARS-CoV-2 RNA is generally detectable in upper and lower  respiratory specimens during the acute phase of infection. The lowest  concentration of SARS-CoV-2 viral copies this assay can detect is 250  copies / mL. A negative result does not preclude SARS-CoV-2 infection  and should not be used as the sole basis for treatment or other  patient management decisions.  A negative result may occur with  improper specimen collection / handling, submission of specimen other  than nasopharyngeal swab, presence of viral mutation(s) within the  areas targeted by this assay, and inadequate number of viral copies  (<250 copies / mL). A negative result must be combined with clinical  observations, patient history, and epidemiological information. If result is POSITIVE SARS-CoV-2 target nucleic acids are DETECTED. The SARS-CoV-2 RNA is generally detectable in upper and lower  respiratory specimens dur ing the acute phase of infection.  Positive  results are indicative of active infection with SARS-CoV-2.  Clinical  correlation with patient history and other diagnostic information is  necessary to determine patient infection status.  Positive results do  not rule out bacterial infection or co-infection with other viruses. If result is PRESUMPTIVE POSTIVE SARS-CoV-2 nucleic acids MAY BE PRESENT.   A presumptive positive result was obtained on the submitted specimen  and confirmed on repeat testing.  While 2019 novel coronavirus  (SARS-CoV-2) nucleic acids may be present  in the submitted sample  additional confirmatory testing may be necessary for epidemiological  and / or clinical management purposes  to differentiate between  SARS-CoV-2 and other Sarbecovirus currently known to infect humans.  If clinically indicated additional testing with an alternate test  methodology [phone]) is advised. The SARS-CoV-2 RNA is generally  detectable in upper and lower respiratory sp ecimens during the acute  phase of infection. The expected result is Negative. Fact Sheet for Patients:  StrictlyIdeas.no Fact Sheet for Healthcare Providers: BankingDealers.co.za This test is not yet approved or cleared by the [place] FDA and has been authorized for detection and/or diagnosis of SARS-CoV-2 by FDA under an Emergency Use Authorization (EUA).  This EUA will remain in effect (meaning this test can be used) for the duration of the COVID-19 declaration under Section 564(b)(1) of the Act, 21 U.S.C. section 360bbb-3(b)(1), unless the authorization is terminated or revoked sooner.  Performed at [facility], [place] [address], [place], [place] [address]   CBC with Differential     Status: Abnormal   Collection Time: 02/17/19  5:21 PM  Result Value Ref Range   WBC 15.3 (H) 4.0 - 10.5 K/uL   RBC 4.47 3.87 - 5.11 MIL/uL   Hemoglobin 14.8 12.0 - 15.0 g/dL   HCT 43.8 36.0 - 46.0 %   MCV 98.0 80.0 - 100.0 fL   MCH 33.1 26.0 - 34.0 pg   MCHC 33.8 30.0 - 36.0 g/dL   RDW 12.2 11.5 - 15.5 %   Platelets 308 150 - 400 K/uL   nRBC 0.0 0.0 - 0.2 %   Neutrophils Relative % 89 %   Neutro Abs 13.6 (H) 1.7 - 7.7 K/uL   Lymphocytes Relative 6 %   Lymphs Abs 0.9 0.7 - 4.0 K/uL   Monocytes Relative 4 %   Monocytes Absolute 0.6 0.1 - 1.0 K/uL   Eosinophils Relative 0 %   Eosinophils Absolute 0.1 0.0 - 0.5 K/uL   Basophils Relative 0 %   Basophils Absolute 0.1 0.0 - 0.1 K/uL   Immature Granulocytes 1 %   Abs Immature  Granulocytes 0.08 (H) 0.00 - 0.07 K/uL    Comment: Performed at [facility], [place] [address], [place], [place] [address]  Comprehensive metabolic panel     Status: Abnormal   Collection Time: 02/17/19  5:21 PM  Result Value Ref Range   Sodium 131 (L) 135 - 145 mmol/L   Potassium 4.3 3.5 - 5.1 mmol/L   Chloride 96 (L) 98 - 111 mmol/L   CO2 21 (L) 22 - 32 mmol/L   Glucose, Bld 133 (H) 70 - 99 mg/dL   BUN 18 8 - 23 mg/dL   Creatinine, Ser 0.97 0.44 - 1.00 mg/dL   Calcium 10.2 8.9 - 10.3 mg/dL   Total Protein 7.5 6.5 - 8.1 g/dL   Albumin 4.3 3.5 - 5.0 g/dL   AST 25 15 - 41 U/L   ALT 17 0 - 44 U/L   Alkaline Phosphatase 75 38 - 126 U/L   Total Bilirubin 0.6 0.3 - 1.2 mg/dL   GFR calc non Af Amer 57 (L) >60 mL/min   GFR calc Af Amer >60 >60 mL/min   Anion gap 14 5 - 15    Comment: Performed at [facility], [place] [address], [place], [place] [address]  Lipase, blood     Status: None   Collection Time: 02/17/19  5:21 PM  Result Value Ref Range   Lipase 33 11 - 51 U/L    Comment: Performed at [facility], [place] [address], [place], [place] [address]    Ct Abdomen Pelvis W Contrast  Result Date: 02/17/2019 CLINICAL DATA:  Abdominal pain with nausea EXAM: CT ABDOMEN AND PELVIS WITH CONTRAST TECHNIQUE: Multidetector CT imaging of the abdomen and pelvis was performed using the standard protocol following bolus administration of intravenous contrast. CONTRAST:  115mL OMNIPAQUE IOHEXOL 300 MG/ML  SOLN COMPARISON:  01/08/2019 radiograph, CT 12/30/2018 FINDINGS: Lower chest: Lung bases demonstrate no consolidation or pleural effusion. The heart size is within normal limits. Large hiatal hernia. Hepatobiliary: Status post cholecystectomy. No focal hepatic abnormality. Stable mild intra and moderate extrahepatic biliary dilatation. Pancreas: Unremarkable. No pancreatic ductal dilatation or surrounding inflammatory changes. Spleen: Normal in size  without focal abnormality. Adrenals/Urinary Tract: Adrenal glands are unremarkable. Kidneys are normal, without renal calculi, focal lesion, or hydronephrosis. Bladder  is unremarkable. Stomach/Bowel: Mild fluid-filled stomach. Multiple dilated loops of fluid-filled small bowel, measuring up to 7 cm in the left lower quadrant with abrupt transition to decompressed distal small bowel and colon consistent with mechanical small bowel obstruction, high-grade. Transition point within the mid pelvis, slightly to the right of midline, series 2, image number 50. No bowel wall thickening. History of appendectomy. Descending and sigmoid colon diverticular disease without acute inflammatory change. Vascular/Lymphatic: Moderate aortic atherosclerosis. No aneurysm. No significantly enlarged lymph nodes. Reproductive: Status post hysterectomy. No adnexal masses. Other: Negative for free air or free fluid Musculoskeletal: Scoliosis of the spine. Chronic compression deformity T12. Mild chronic superior endplate deformity at L2. Grade 1 anterolisthesis L3 on L4 and L4 on L5. Diffuse degenerative changes. IMPRESSION: 1. Multiple markedly dilated fluid-filled loops of small bowel with abrupt transition to decompressed mid to distal small, findings are consistent with high-grade small bowel obstruction. Transition point visualized within the mid pelvis, slightly to the right of midline. Negative for free air 2. Status post cholecystectomy. Stable intra and extrahepatic biliary dilatation 3. Descending and sigmoid colon diverticular disease without acute inflammatory change Electronically Signed   By: Donavan Foil M.D.   On: 02/17/2019 19:26    Review of Systems  Constitutional: Negative for chills and fever.  HENT: Negative for hearing loss.   Eyes: Negative for blurred vision.  Respiratory: Negative for cough and shortness of breath.   Cardiovascular: Negative for chest pain and palpitations.  Gastrointestinal: Positive for  abdominal pain and vomiting.  Genitourinary: Negative for dysuria and urgency.  Skin: Negative for itching and rash.  Neurological: Negative for dizziness and headaches.   Blood pressure 124/71, pulse 94, temperature (!) 97.4 F (36.3 C), temperature source Oral, resp. rate 16, SpO2 96 %. Physical Exam  Constitutional: She is oriented to person, place, and time. She appears well-developed and well-nourished.  HENT:  Head: Normocephalic and atraumatic.  Eyes: Pupils are equal, round, and reactive to light. Conjunctivae and EOM are normal.  Neck: Normal range of motion. Neck supple.  Cardiovascular: Normal rate and regular rhythm.  Respiratory: Effort normal and breath sounds normal.  GI: Soft. She exhibits distension. There is no abdominal tenderness.  Musculoskeletal: Normal range of motion.  Neurological: She is alert and oriented to person, place, and time.  Skin: Skin is warm and dry.    Assessment/Plan: 73 y.o. F with recurrent SBO, multiple medical problems.  Insert NG, NPO, IVF's.  Will start SBO protocol .  Rosario Adie 11/12/256, 8:57 PM

## 2019-02-17 NOTE — Progress Notes (Signed)
Patient was admitted to [facility] ED placed an NG tube but as per report, due to the Hiatal Hernia, the ABD X-Ray revealed that the NG Tube was not in the correct position, so the NG Tube was removed.  Currently the patient does not have an NG Tube.

## 2019-02-17 NOTE — H&P (Addendum)
TRH H&P    Patient Demographics:    Ashley Howard, is a 73 y.o. female  MRN: [id number]  DOB - Jul 30, 1945  Admit Date - 02/17/2019  Referring MD/NP/PA:Julie Haviland  Outpatient Primary MD for the patient is Lynnell Catalan, [place]  Patient coming from:  home  Chief complaint-  Abdominal pain   HPI:    Ashley Howard  is a 73 y.o. female, w chronic back pain, gerd,  h/o SBO, h/o ccy, hysterectomy, appy, apparently presents with c/o abdominal pain and nausea after breakfast.  Had n/v x3.  Pt sent to ER for evaluation.   In ED,  T 97.4, P 85  R 18, Bp 155/82  Pox 95% on RA  CT scan abd/ pelvis IMPRESSION: 1. Multiple markedly dilated fluid-filled loops of small bowel with abrupt transition to decompressed mid to distal small, findings are consistent with high-grade small bowel obstruction. Transition point visualized within the mid pelvis, slightly to the right of midline. Negative for free air 2. Status post cholecystectomy. Stable intra and extrahepatic biliary dilatation 3. Descending and sigmoid colon diverticular disease without acute inflammatory change   Wbc 15.3, Hgb 14.8, Plt 308 Na 131, K 4.3, Bun 18, Creatinine 0.97 Glucose 133 Ast 25, Alt 17 Lipase 33  covid -19 negative  Pt will be admitted for SBO.     Review of systems:    In addition to the HPI above,  No Fever-chills, No Headache, No changes with Vision or hearing, No problems swallowing food or Liquids, No Chest pain, Cough or Shortness of Breath,  No Blood in stool or Urine, No dysuria, No new skin rashes or bruises, No new joints pains-aches,  No new weakness, tingling, numbness in any extremity, No recent weight gain or loss, No polyuria, polydypsia or polyphagia, No significant Mental Stressors.  All other systems reviewed and are negative.    Past History of the following :    Past Medical History:   Diagnosis Date  . Anemia   . Arthritis    "qwhere" (08/21/2016)  . Aspiration pneumonia ([place]) 07/2016  . Bowel incontinence   . Chronic back pain   . Chronic bronchitis ([place])   . Chronic low back pain   . Fluid retention in legs    wears compression stocking R leg  . Gait abnormality   . Gallstones   . GERD (gastroesophageal reflux disease)   . H/O hiatal hernia   . H/O small bowel obstruction 07/2016   admitted to [facility] 1/2 -07/24/16 with SBO which caused aspiration pneumonia and septic shock/notes 08/21/2016  . Hearing loss    mild  . Hemorrhoid   . History of blood transfusion 1975   "had a reaction"  . Incontinence of urine    wears adult briefs  . Pain, dental   . PONV (postoperative nausea and vomiting)    "doesn't bother me so bad anymore" (08/21/2016)  . Poor historian   . Protein calorie malnutrition ([place])   . Scoliosis   . Scoliosis   . Seasonal allergies   .  Shortness of breath    with pain  . Small bowel obstruction ([place]) 08/21/2016  . Status post partial amputation of foot, left ([place])   . Weight loss, unintentional    40 lbs in last year      Past Surgical History:  Procedure Laterality Date  . ABDOMINAL HYSTERECTOMY  07/1973   "still have one of my ovaries"  . AMPUTATION  11/07/2011   Procedure: AMPUTATION RAY;  Surgeon: Newt Minion, MD;  Location: [place];  Service: Orthopedics;  Laterality: Left;  Left Foot 1st Ray Amputation  . APPENDECTOMY    . BLADDER REPAIR  ~ 02/1974   "hole in my bladder & a place that hadn't healed up from 07/1973 OR for hysterectomy"  . BREAST LUMPECTOMY Bilateral 07/1987   "benign"  . CATARACT EXTRACTION W/ INTRAOCULAR LENS  IMPLANT, BILATERAL Bilateral   . CHOLECYSTECTOMY N/A 08/26/2016   Procedure: LAPAROSCOPIC CHOLECYSTECTOMY;  Surgeon: Georganna Skeans, MD;  Location: [place];  Service: General;  Laterality: N/A;  . COLONOSCOPY WITH PROPOFOL N/A 07/18/2017   Procedure: COLONOSCOPY WITH PROPOFOL;  Surgeon: Doran Stabler, MD;   Location: [facility];  Service: Gastroenterology;  Laterality: N/A;  . DOBUTAMINE STRESS ECHO    . ERCP N/A 08/24/2016   Procedure: ENDOSCOPIC RETROGRADE CHOLANGIOPANCREATOGRAPHY (ERCP);  Surgeon: Milus Banister, MD;  Location: [place];  Service: Endoscopy;  Laterality: N/A;  . EXPLORATORY LAPAROTOMY  07/1973   "to straighten out my guts 7 days after hysterectomy"  . KNEE ARTHROSCOPY Left   . LAPAROSCOPIC LYSIS OF ADHESIONS N/A 08/26/2016   Procedure: LAPAROSCOPIC LYSIS OF ADHESIONS FOR 20 MINUTES;  Surgeon: Georganna Skeans, MD;  Location: [place];  Service: General;  Laterality: N/A;  . LESION REMOVAL N/A 10/02/2017   Procedure: EXCISION OF ANAL POLYPOID LESION;  Surgeon: Ileana Roup, MD;  Location: [facility];  Service: General;  Laterality: N/A;  . RECTAL EXAM UNDER ANESTHESIA N/A 10/02/2017   Procedure: RECTAL EXAM UNDER ANESTHESIA;  Surgeon: Ileana Roup, MD;  Location: [facility];  Service: General;  Laterality: N/A;  . TONSILLECTOMY AND ADENOIDECTOMY        Social History:      Social History   Tobacco Use  . Smoking status: Former Smoker    Packs/day: 1.00    Years: 54.00    Pack years: 54.00    Types: Cigarettes    Quit date: 07/15/2016    Years since quitting: 2.5  . Smokeless tobacco: Never Used  Substance Use Topics  . Alcohol use: No    Comment: 08/21/2016 "nothing since ~ 04/2016"       Family History :     Family History  Problem Relation Age of Onset  . Hypertension Mother   . Cancer Father   . Diabetes Neg Hx   . Stroke Neg Hx        Home Medications:   Prior to Admission medications   Medication Sig Start Date End Date Taking? Authorizing Provider  acetaminophen (TYLENOL) 500 MG tablet Take 500 mg by mouth every 8 (eight) hours as needed.    [provider]  alum & mag hydroxide-simeth (MAALOX/MYLANTA) 200-200-20 MG/5ML suspension Take 30 mLs by mouth every 6 (six) hours as needed for indigestion or heartburn.    [provider]  Calcium Carb-Cholecalciferol (CALCIUM 500 + D3) 500-600 MG-UNIT TABS Take 1 tablet by mouth 2 (two) times daily. (0800 & 2000)    [provider]  docusate sodium (COLACE) 100  MG capsule Take 100 mg by mouth daily. (0800)    [provider]  gabapentin (NEURONTIN) 100 MG capsule Take 100 mg by mouth at bedtime. (2000)    [provider]  loratadine (CLARITIN) 10 MG tablet Take 10 mg by mouth daily.    [provider]  LORazepam (ATIVAN) 0.5 MG tablet Take 1 tablet (0.5 mg total) by mouth every 6 (six) hours as needed for anxiety. Take 0.5 mg by mouth at bedtime. May also take twice daily as needed for anxiety. 01/10/19   Spongberg, Audie Pinto, MD  meloxicam (MOBIC) 15 MG tablet Take 15 mg by mouth daily. (0800)    [provider]  mirabegron ER (MYRBETRIQ) 25 MG TB24 tablet Take 25 mg by mouth daily. 0800    [provider]  Multiple Vitamin (MULTIVITAMIN WITH MINERALS) TABS tablet Take 1 tablet by mouth daily. (0800)    [provider]  omeprazole (PRILOSEC) 20 MG capsule Take 20 mg by mouth daily. (0800)    [provider]  ondansetron (ZOFRAN) 4 MG tablet Take 1 tablet (4 mg total) by mouth daily as needed for nausea or vomiting. 01/10/19 01/10/20  Spongberg, Audie Pinto, MD  Psyllium (KONSYL DAILY FIBER) 28.3 % POWD Take 10 mLs by mouth daily. Mix in 8 ounces of water    [provider]  risedronate (ACTONEL) 35 MG tablet Take 35 mg by mouth every Wednesday. Mix with 8 oz of water. Remain upright and no food/drink for 30 min. Do not crush    [provider]  simethicone (MYLICON) 696 MG chewable tablet Chew 125 mg by mouth 2 (two) times daily. (0800 & 1800)    [provider]  triamcinolone ointment (KENALOG) 0.1 % Apply 1 application topically 2 (two) times a day. 11/16/18   [provider]     Allergies:     Allergies  Allergen Reactions  . Tape Other (See Comments)     Paper tape - blisters     Physical Exam:   Vitals  Blood pressure (!) 145/83, pulse 91, temperature (!) 97.4 F (36.3 C), temperature source Oral, resp. rate 18, SpO2 94 %.  1.  General: aoxo3  2. Psychiatric: euthymic  3. Neurologic: cn2-12 intact, reflexes 2+ symmetric, diffuse with no clonus,  Motor 5/5 in all 4 ext  4. HEENMT:  Anicteric, pupils 1.59mm symmetric, direct, consensual, near intact Neck: no jvd  5. Respiratory : CTAB  6. Cardiovascular : rrr s1, s2, no m/g/r  7. Gastrointestinal:  Abd: soft, slightly distended,  nt, +bs  8. Skin:  Ext: no c/c/e,  No rash  9.Musculoskeletal:  Good ROM      Data Review:    CBC Recent Labs  Lab 02/17/19 1721  WBC 15.3*  HGB 14.8  HCT 43.8  PLT 308  MCV 98.0  MCH 33.1  MCHC 33.8  RDW 12.2  LYMPHSABS 0.9  MONOABS 0.6  EOSABS 0.1  BASOSABS 0.1   ------------------------------------------------------------------------------------------------------------------  Results for orders placed or performed during the hospital encounter of 02/17/19 (from the past 48 hour(s))  SARS Coronavirus 2 [facility] order, Performed in [facility] hospital lab) Nasopharyngeal Nasopharyngeal Swab     Status: None   Collection Time: 02/17/19  3:56 PM   Specimen: Nasopharyngeal Swab  Result Value Ref Range   SARS Coronavirus 2 NEGATIVE NEGATIVE    Comment: (NOTE) If result is NEGATIVE SARS-CoV-2 target nucleic acids are NOT DETECTED. The SARS-CoV-2 RNA is generally detectable in upper and  lower  respiratory specimens during the acute phase of infection. The lowest  concentration of SARS-CoV-2 viral copies this assay can detect is 250  copies / mL. A negative result does not preclude SARS-CoV-2 infection  and should not be used as the sole basis for treatment or other  patient management decisions.  A negative result may occur with  improper specimen collection / handling, submission of specimen other  than  nasopharyngeal swab, presence of viral mutation(s) within the  areas targeted by this assay, and inadequate number of viral copies  (<250 copies / mL). A negative result must be combined with clinical  observations, patient history, and epidemiological information. If result is POSITIVE SARS-CoV-2 target nucleic acids are DETECTED. The SARS-CoV-2 RNA is generally detectable in upper and lower  respiratory specimens dur ing the acute phase of infection.  Positive  results are indicative of active infection with SARS-CoV-2.  Clinical  correlation with patient history and other diagnostic information is  necessary to determine patient infection status.  Positive results do  not rule out bacterial infection or co-infection with other viruses. If result is PRESUMPTIVE POSTIVE SARS-CoV-2 nucleic acids MAY BE PRESENT.   A presumptive positive result was obtained on the submitted specimen  and confirmed on repeat testing.  While 2019 novel coronavirus  (SARS-CoV-2) nucleic acids may be present in the submitted sample  additional confirmatory testing may be necessary for epidemiological  and / or clinical management purposes  to differentiate between  SARS-CoV-2 and other Sarbecovirus currently known to infect humans.  If clinically indicated additional testing with an alternate test  methodology [phone]) is advised. The SARS-CoV-2 RNA is generally  detectable in upper and lower respiratory sp ecimens during the acute  phase of infection. The expected result is Negative. Fact Sheet for Patients:  StrictlyIdeas.no Fact Sheet for Healthcare Providers: BankingDealers.co.za This test is not yet approved or cleared by the [place] FDA and has been authorized for detection and/or diagnosis of SARS-CoV-2 by FDA under an Emergency Use Authorization (EUA).  This EUA will remain in effect (meaning this test can be used) for the duration of  the COVID-19 declaration under Section 564(b)(1) of the Act, 21 U.S.C. section 360bbb-3(b)(1), unless the authorization is terminated or revoked sooner. Performed at [facility], [place] [address] [place], [place] [address]   CBC with Differential     Status: Abnormal   Collection Time: 02/17/19  5:21 PM  Result Value Ref Range   WBC 15.3 (H) 4.0 - 10.5 K/uL   RBC 4.47 3.87 - 5.11 MIL/uL   Hemoglobin 14.8 12.0 - 15.0 g/dL   HCT 43.8 36.0 - 46.0 %   MCV 98.0 80.0 - 100.0 fL   MCH 33.1 26.0 - 34.0 pg   MCHC 33.8 30.0 - 36.0 g/dL   RDW 12.2 11.5 - 15.5 %   Platelets 308 150 - 400 K/uL   nRBC 0.0 0.0 - 0.2 %   Neutrophils Relative % 89 %   Neutro Abs 13.6 (H) 1.7 - 7.7 K/uL   Lymphocytes Relative 6 %   Lymphs Abs 0.9 0.7 - 4.0 K/uL   Monocytes Relative 4 %   Monocytes Absolute 0.6 0.1 - 1.0 K/uL   Eosinophils Relative 0 %   Eosinophils Absolute 0.1 0.0 - 0.5 K/uL   Basophils Relative 0 %   Basophils Absolute 0.1 0.0 - 0.1 K/uL   Immature Granulocytes 1 %   Abs Immature Granulocytes 0.08 (H) 0.00 - 0.07 K/uL  Comment: Performed at [facility], [place] [address] [place], [place] [address]  Comprehensive metabolic panel     Status: Abnormal   Collection Time: 02/17/19  5:21 PM  Result Value Ref Range   Sodium 131 (L) 135 - 145 mmol/L   Potassium 4.3 3.5 - 5.1 mmol/L   Chloride 96 (L) 98 - 111 mmol/L   CO2 21 (L) 22 - 32 mmol/L   Glucose, Bld 133 (H) 70 - 99 mg/dL   BUN 18 8 - 23 mg/dL   Creatinine, Ser 0.97 0.44 - 1.00 mg/dL   Calcium 10.2 8.9 - 10.3 mg/dL   Total Protein 7.5 6.5 - 8.1 g/dL   Albumin 4.3 3.5 - 5.0 g/dL   AST 25 15 - 41 U/L   ALT 17 0 - 44 U/L   Alkaline Phosphatase 75 38 - 126 U/L   Total Bilirubin 0.6 0.3 - 1.2 mg/dL   GFR calc non Af Amer 57 (L) >60 mL/min   GFR calc Af Amer >60 >60 mL/min   Anion gap 14 5 - 15    Comment: Performed at [facility], [place] [address], [place], [place] [address]   Lipase, blood     Status: None   Collection Time: 02/17/19  5:21 PM  Result Value Ref Range   Lipase 33 11 - 51 U/L    Comment: Performed at [facility], [place] [address] [place], [place] [address]    Chemistries  Recent Labs  Lab 02/17/19 1721  NA 131*  K 4.3  CL 96*  CO2 21*  GLUCOSE 133*  BUN 18  CREATININE 0.97  CALCIUM 10.2  AST 25  ALT 17  ALKPHOS 75  BILITOT 0.6   ------------------------------------------------------------------------------------------------------------------  ------------------------------------------------------------------------------------------------------------------ GFR: CrCl cannot be calculated (Unknown ideal weight.). Liver Function Tests: Recent Labs  Lab 02/17/19 1721  AST 25  ALT 17  ALKPHOS 75  BILITOT 0.6  PROT 7.5  ALBUMIN 4.3   Recent Labs  Lab 02/17/19 1721  LIPASE 33   No results for input(s): AMMONIA in the last 168 hours. Coagulation Profile: No results for input(s): INR, PROTIME in the last 168 hours. Cardiac Enzymes: No results for input(s): CKTOTAL, CKMB, CKMBINDEX, TROPONINI in the last 168 hours. BNP (last 3 results) No results for input(s): PROBNP in the last 8760 hours. HbA1C: No results for input(s): HGBA1C in the last 72 hours. CBG: No results for input(s): GLUCAP in the last 168 hours. Lipid Profile: No results for input(s): CHOL, HDL, LDLCALC, TRIG, CHOLHDL, LDLDIRECT in the last 72 hours. Thyroid Function Tests: No results for input(s): TSH, T4TOTAL, FREET4, T3FREE, THYROIDAB in the last 72 hours. Anemia Panel: No results for input(s): VITAMINB12, FOLATE, FERRITIN, TIBC, IRON, RETICCTPCT in the last 72 hours.  --------------------------------------------------------------------------------------------------------------- Urine analysis:    Component Value Date/Time   COLORURINE YELLOW 12/30/2018 1446   APPEARANCEUR CLEAR 12/30/2018 1446   LABSPEC >1.046 (H) 12/30/2018  1446   PHURINE 6.0 12/30/2018 1446   GLUCOSEU NEGATIVE 12/30/2018 1446   HGBUR NEGATIVE 12/30/2018 1446   BILIRUBINUR NEGATIVE 12/30/2018 1446   KETONESUR NEGATIVE 12/30/2018 1446   PROTEINUR NEGATIVE 12/30/2018 1446   UROBILINOGEN 0.2 06/27/2016 1216   NITRITE NEGATIVE 12/30/2018 1446   LEUKOCYTESUR NEGATIVE 12/30/2018 1446      Imaging Results:    Ct Abdomen Pelvis W Contrast  Result Date: 02/17/2019 CLINICAL DATA:  Abdominal pain with nausea EXAM: CT ABDOMEN AND PELVIS WITH CONTRAST TECHNIQUE: Multidetector CT imaging of the abdomen  and pelvis was performed using the standard protocol following bolus administration of intravenous contrast. CONTRAST:  162mL OMNIPAQUE IOHEXOL 300 MG/ML  SOLN COMPARISON:  01/08/2019 radiograph, CT 12/30/2018 FINDINGS: Lower chest: Lung bases demonstrate no consolidation or pleural effusion. The heart size is within normal limits. Large hiatal hernia. Hepatobiliary: Status post cholecystectomy. No focal hepatic abnormality. Stable mild intra and moderate extrahepatic biliary dilatation. Pancreas: Unremarkable. No pancreatic ductal dilatation or surrounding inflammatory changes. Spleen: Normal in size without focal abnormality. Adrenals/Urinary Tract: Adrenal glands are unremarkable. Kidneys are normal, without renal calculi, focal lesion, or hydronephrosis. Bladder is unremarkable. Stomach/Bowel: Mild fluid-filled stomach. Multiple dilated loops of fluid-filled small bowel, measuring up to 7 cm in the left lower quadrant with abrupt transition to decompressed distal small bowel and colon consistent with mechanical small bowel obstruction, high-grade. Transition point within the mid pelvis, slightly to the right of midline, series 2, image number 50. No bowel wall thickening. History of appendectomy. Descending and sigmoid colon diverticular disease without acute inflammatory change. Vascular/Lymphatic: Moderate aortic atherosclerosis. No aneurysm. No significantly  enlarged lymph nodes. Reproductive: Status post hysterectomy. No adnexal masses. Other: Negative for free air or free fluid Musculoskeletal: Scoliosis of the spine. Chronic compression deformity T12. Mild chronic superior endplate deformity at L2. Grade 1 anterolisthesis L3 on L4 and L4 on L5. Diffuse degenerative changes. IMPRESSION: 1. Multiple markedly dilated fluid-filled loops of small bowel with abrupt transition to decompressed mid to distal small, findings are consistent with high-grade small bowel obstruction. Transition point visualized within the mid pelvis, slightly to the right of midline. Negative for free air 2. Status post cholecystectomy. Stable intra and extrahepatic biliary dilatation 3. Descending and sigmoid colon diverticular disease without acute inflammatory change Electronically Signed   By: Donavan Foil M.D.   On: 02/17/2019 19:26        Assessment & Plan:    Principal Problem:   SBO (small bowel obstruction) (HCC) Active Problems:   GERD (gastroesophageal reflux disease)   Hyponatremia  SBO Check lactic acid NPO Ns iv NGT to low intermittent suction Morphine sulfate 1mg  iv q4h prn zofran 4mg  iv q6h prn  Surgery consulted by ED, appreciate input Check cbc, cmp in am  Gerd Cont PPI  N/v 12 lead ekg Check trop I zofran as above Phenergan iv if zofran ineffective  Hyponatremia Likely secondary to pain Hydrate with ns iv Check cmp in am  ? Acute lower uti Urine culture pending Rocephin 1gm iv qday   DVT Prophylaxis-   Lovenox - SCDs   AM Labs Ordered, also please review Full Orders  Family Communication: Admission, patients condition and plan of care including tests being ordered have been discussed with the patient  who indicate understanding and agree with the plan and Code Status.  Code Status:  FULL CODE, attempted to contact son, left message that patient admitted to [facility]  Admission status: Inpatient: Based on patients clinical presentation  and evaluation of above clinical data, I have made determination that patient meets Inpatient criteria at this time. Pt has SBO, pt has high risk of clinical deterioration,  Pt will require iv hydration and ngt to low intermittent suction and possibly surgery , pt will require > 2 nites stay  Time spent in minutes : 70    Jani Gravel M.D on 02/17/2019 at 8:16 PM

## 2019-02-17 NOTE — ED Triage Notes (Signed)
Bib Ems from [facility] assisted living. Abdominal pain and nausea started at 0900 after breakfast. First episode of vomiting at 1430 and has vomited 3 times. Pain not relieved by vomiting. Facility gave tylenol and "nausea medication" at 1330. HX of small bowel obstruction.   160/90 HR 78 RR 18 Temp 97.6 97% RA

## 2019-02-17 NOTE — ED Notes (Signed)
x6 IV attempts by 3 RNs. IV [place] RN will attempt IV.

## 2019-02-17 NOTE — ED Notes (Signed)
Patient transported to CT 

## 2019-02-17 NOTE — ED Notes (Signed)
ED TO INPATIENT HANDOFF REPORT  Name/Age/Gender Ashley Howard 73 y.o. female  Code Status    Code Status Orders  (From admission, onward)         Start     Ordered   02/17/19 2010  Full code  Continuous     02/17/19 2010        Code Status History    Date Active Date Inactive Code Status Order ID Comments User Context   12/30/2018 1405 01/10/2019 1759 Full Code [id number]  Shelly Coss, MD ED   08/21/2016 1711 08/29/2016 1745 Full Code [id number]  Waldemar Dickens, MD ED   07/16/2016 2240 07/24/2016 2139 Full Code [id number]  Armandina Gemma, MD ED   11/07/2011 1047 11/11/2011 1650 Full Code [id number]  Grandville Silos, RN Inpatient   Advance Care Planning Activity    Advance Directive Documentation     Most Recent Value  Type of Advance Directive  Healthcare Power of [place]  Pre-existing out of facility DNR order (yellow form or pink MOST form)  -  "MOST" Form in Place?  -      Home/SNF/Other Home  Chief Complaint abd pain  Level of Care/Admitting Diagnosis ED Disposition    ED Disposition Condition [place]: [place] [[id number]]  Level of Care: Telemetry [5]  Admit to tele based on following criteria: Monitor for Ischemic changes  Covid Evaluation: Asymptomatic Screening Protocol (No Symptoms)  Diagnosis: SBO (small bowel obstruction) [facility]) [[id number]]  Admitting Physician: Jani Gravel [[id number]]  Attending Physician: Jani Gravel [phone]  Estimated length of stay: past midnight tomorrow  Certification:: I certify this patient will need inpatient services for at least 2 midnights  PT Class (Do Not Modify): Inpatient [101]  PT Acc Code (Do Not Modify): Private [1]       Medical History Past Medical History:  Diagnosis Date  . Anemia   . Arthritis    "qwhere" (08/21/2016)  . Aspiration pneumonia ([place]) 07/2016  . Bowel incontinence   . Chronic back pain   . Chronic bronchitis ([place])   . Chronic low back pain   . Fluid  retention in legs    wears compression stocking R leg  . Gait abnormality   . Gallstones   . GERD (gastroesophageal reflux disease)   . H/O hiatal hernia   . H/O small bowel obstruction 07/2016   admitted to [facility] 1/2 -07/24/16 with SBO which caused aspiration pneumonia and septic shock/notes 08/21/2016  . Hearing loss    mild  . Hemorrhoid   . History of blood transfusion 1975   "had a reaction"  . Incontinence of urine    wears adult briefs  . Pain, dental   . PONV (postoperative nausea and vomiting)    "doesn't bother me so bad anymore" (08/21/2016)  . Poor historian   . Protein calorie malnutrition ([place])   . Scoliosis   . Scoliosis   . Seasonal allergies   . Shortness of breath    with pain  . Small bowel obstruction ([place]) 08/21/2016  . Status post partial amputation of foot, left ([place])   . Weight loss, unintentional    40 lbs in last year    Allergies Allergies  Allergen Reactions  . Tape Other (See Comments)    Paper tape - blisters    IV Location/Drains/Wounds Patient Lines/Drains/Airways Status   Active Line/Drains/Airways    Name:   Placement date:   Placement time:   Site:  Days:   Peripheral IV 02/17/19 Right;Upper Arm   02/17/19    1819    Arm   less than 1          Labs/Imaging Results for orders placed or performed during the hospital encounter of 02/17/19 (from the past 48 hour(s))  SARS Coronavirus 2 [facility] order, Performed in [facility] hospital lab) Nasopharyngeal Nasopharyngeal Swab     Status: None   Collection Time: 02/17/19  3:56 PM   Specimen: Nasopharyngeal Swab  Result Value Ref Range   SARS Coronavirus 2 NEGATIVE NEGATIVE    Comment: (NOTE) If result is NEGATIVE SARS-CoV-2 target nucleic acids are NOT DETECTED. The SARS-CoV-2 RNA is generally detectable in upper and lower  respiratory specimens during the acute phase of infection. The lowest  concentration of SARS-CoV-2 viral copies this assay can detect is 250  copies / mL. A  negative result does not preclude SARS-CoV-2 infection  and should not be used as the sole basis for treatment or other  patient management decisions.  A negative result may occur with  improper specimen collection / handling, submission of specimen other  than nasopharyngeal swab, presence of viral mutation(s) within the  areas targeted by this assay, and inadequate number of viral copies  (<250 copies / mL). A negative result must be combined with clinical  observations, patient history, and epidemiological information. If result is POSITIVE SARS-CoV-2 target nucleic acids are DETECTED. The SARS-CoV-2 RNA is generally detectable in upper and lower  respiratory specimens dur ing the acute phase of infection.  Positive  results are indicative of active infection with SARS-CoV-2.  Clinical  correlation with patient history and other diagnostic information is  necessary to determine patient infection status.  Positive results do  not rule out bacterial infection or co-infection with other viruses. If result is PRESUMPTIVE POSTIVE SARS-CoV-2 nucleic acids MAY BE PRESENT.   A presumptive positive result was obtained on the submitted specimen  and confirmed on repeat testing.  While 2019 novel coronavirus  (SARS-CoV-2) nucleic acids may be present in the submitted sample  additional confirmatory testing may be necessary for epidemiological  and / or clinical management purposes  to differentiate between  SARS-CoV-2 and other Sarbecovirus currently known to infect humans.  If clinically indicated additional testing with an alternate test  methodology [phone]) is advised. The SARS-CoV-2 RNA is generally  detectable in upper and lower respiratory sp ecimens during the acute  phase of infection. The expected result is Negative. Fact Sheet for Patients:  StrictlyIdeas.no Fact Sheet for Healthcare Providers: BankingDealers.co.za This test is not  yet approved or cleared by the [place] FDA and has been authorized for detection and/or diagnosis of SARS-CoV-2 by FDA under an Emergency Use Authorization (EUA).  This EUA will remain in effect (meaning this test can be used) for the duration of the COVID-19 declaration under Section 564(b)(1) of the Act, 21 U.S.C. section 360bbb-3(b)(1), unless the authorization is terminated or revoked sooner. Performed at [facility], [place] [address] [place], [place] [address]   CBC with Differential     Status: Abnormal   Collection Time: 02/17/19  5:21 PM  Result Value Ref Range   WBC 15.3 (H) 4.0 - 10.5 K/uL   RBC 4.47 3.87 - 5.11 MIL/uL   Hemoglobin 14.8 12.0 - 15.0 g/dL   HCT 43.8 36.0 - 46.0 %   MCV 98.0 80.0 - 100.0 fL   MCH 33.1 26.0 - 34.0 pg   MCHC 33.8 30.0 -  36.0 g/dL   RDW 12.2 11.5 - 15.5 %   Platelets 308 150 - 400 K/uL   nRBC 0.0 0.0 - 0.2 %   Neutrophils Relative % 89 %   Neutro Abs 13.6 (H) 1.7 - 7.7 K/uL   Lymphocytes Relative 6 %   Lymphs Abs 0.9 0.7 - 4.0 K/uL   Monocytes Relative 4 %   Monocytes Absolute 0.6 0.1 - 1.0 K/uL   Eosinophils Relative 0 %   Eosinophils Absolute 0.1 0.0 - 0.5 K/uL   Basophils Relative 0 %   Basophils Absolute 0.1 0.0 - 0.1 K/uL   Immature Granulocytes 1 %   Abs Immature Granulocytes 0.08 (H) 0.00 - 0.07 K/uL    Comment: Performed at [facility], [place] [address], [place], [place] [address]  Comprehensive metabolic panel     Status: Abnormal   Collection Time: 02/17/19  5:21 PM  Result Value Ref Range   Sodium 131 (L) 135 - 145 mmol/L   Potassium 4.3 3.5 - 5.1 mmol/L   Chloride 96 (L) 98 - 111 mmol/L   CO2 21 (L) 22 - 32 mmol/L   Glucose, Bld 133 (H) 70 - 99 mg/dL   BUN 18 8 - 23 mg/dL   Creatinine, Ser 0.97 0.44 - 1.00 mg/dL   Calcium 10.2 8.9 - 10.3 mg/dL   Total Protein 7.5 6.5 - 8.1 g/dL   Albumin 4.3 3.5 - 5.0 g/dL   AST 25 15 - 41 U/L   ALT 17 0 - 44 U/L   Alkaline Phosphatase 75 38  - 126 U/L   Total Bilirubin 0.6 0.3 - 1.2 mg/dL   GFR calc non Af Amer 57 (L) >60 mL/min   GFR calc Af Amer >60 >60 mL/min   Anion gap 14 5 - 15    Comment: Performed at [facility], [place] [address] [place], [place] [address]  Lipase, blood     Status: None   Collection Time: 02/17/19  5:21 PM  Result Value Ref Range   Lipase 33 11 - 51 U/L    Comment: Performed at [facility], [place] [address] [place], [place] [address]  Urinalysis, Routine w reflex microscopic     Status: Abnormal   Collection Time: 02/17/19  9:03 PM  Result Value Ref Range   Color, Urine YELLOW YELLOW   APPearance CLOUDY (A) CLEAR   Specific Gravity, Urine >1.046 (H) 1.005 - 1.030   pH 5.0 5.0 - 8.0   Glucose, UA NEGATIVE NEGATIVE mg/dL   Hgb urine dipstick NEGATIVE NEGATIVE   Bilirubin Urine NEGATIVE NEGATIVE   Ketones, ur 5 (A) NEGATIVE mg/dL   Protein, ur NEGATIVE NEGATIVE mg/dL   Nitrite NEGATIVE NEGATIVE   Leukocytes,Ua NEGATIVE NEGATIVE   RBC / HPF 6-10 0 - 5 RBC/hpf   WBC, UA 6-10 0 - 5 WBC/hpf   Bacteria, UA RARE (A) NONE SEEN   Squamous Epithelial / LPF 0-5 0 - 5   Mucus PRESENT     Comment: Performed at [facility], [place] [address] [place], [place] [address]   Ct Abdomen Pelvis W Contrast  Result Date: 02/17/2019 CLINICAL DATA:  Abdominal pain with nausea EXAM: CT ABDOMEN AND PELVIS WITH CONTRAST TECHNIQUE: Multidetector CT imaging of the abdomen and pelvis was performed using the standard protocol following bolus administration of intravenous contrast. CONTRAST:  151mL OMNIPAQUE IOHEXOL 300 MG/ML  SOLN COMPARISON:  01/08/2019 radiograph, CT 12/30/2018 FINDINGS: Lower chest: Lung bases demonstrate no  consolidation or pleural effusion. The heart size is within normal limits. Large hiatal hernia. Hepatobiliary: Status post cholecystectomy. No focal hepatic abnormality. Stable mild intra and moderate extrahepatic biliary dilatation. Pancreas:  Unremarkable. No pancreatic ductal dilatation or surrounding inflammatory changes. Spleen: Normal in size without focal abnormality. Adrenals/Urinary Tract: Adrenal glands are unremarkable. Kidneys are normal, without renal calculi, focal lesion, or hydronephrosis. Bladder is unremarkable. Stomach/Bowel: Mild fluid-filled stomach. Multiple dilated loops of fluid-filled small bowel, measuring up to 7 cm in the left lower quadrant with abrupt transition to decompressed distal small bowel and colon consistent with mechanical small bowel obstruction, high-grade. Transition point within the mid pelvis, slightly to the right of midline, series 2, image number 50. No bowel wall thickening. History of appendectomy. Descending and sigmoid colon diverticular disease without acute inflammatory change. Vascular/Lymphatic: Moderate aortic atherosclerosis. No aneurysm. No significantly enlarged lymph nodes. Reproductive: Status post hysterectomy. No adnexal masses. Other: Negative for free air or free fluid Musculoskeletal: Scoliosis of the spine. Chronic compression deformity T12. Mild chronic superior endplate deformity at L2. Grade 1 anterolisthesis L3 on L4 and L4 on L5. Diffuse degenerative changes. IMPRESSION: 1. Multiple markedly dilated fluid-filled loops of small bowel with abrupt transition to decompressed mid to distal small, findings are consistent with high-grade small bowel obstruction. Transition point visualized within the mid pelvis, slightly to the right of midline. Negative for free air 2. Status post cholecystectomy. Stable intra and extrahepatic biliary dilatation 3. Descending and sigmoid colon diverticular disease without acute inflammatory change Electronically Signed   By: Donavan Foil M.D.   On: 02/17/2019 19:26   Dg Abd Portable 1 View  Result Date: 02/17/2019 CLINICAL DATA:  NG tube placement. Small bowel obstruction. EXAM: PORTABLE ABDOMEN - 1 VIEW COMPARISON:  CT scan dated 02/17/2019 FINDINGS:  The NG tube tip is at the level of the diaphragm. The patient has a known large hiatal hernia with narrowing of the gastric lumen at the level of the diaphragmatic hiatus as demonstrated on the prior CT scan. Persistent dilated small bowel loops. Contrast seen in the kidneys and bladder from recent CT scan. Thoracolumbar scoliosis. IMPRESSION: NG tube tip at the level of the diaphragm likely in the hiatal hernia above the diaphragmatic hiatus. Persistent small bowel obstruction. Critical Value/emergent results were called by telephone at the time of interpretation on 02/17/2019 at 9:02 pm to Dr. Gilford Raid, who verbally acknowledged these results. Electronically Signed   By: Lorriane Shire M.D.   On: 02/17/2019 21:04    Pending Labs Unresulted Labs (From admission, onward)    Start     Ordered   02/24/19 0500  Creatinine, serum  (enoxaparin (LOVENOX)    CrCl >/= 30 ml/min)  Weekly,   R    Comments: while on enoxaparin therapy    02/17/19 2010   02/18/19 0500  Comprehensive metabolic panel  Tomorrow morning,   R     02/17/19 2010   02/18/19 0500  CBC  Tomorrow morning,   R     02/17/19 2010   02/17/19 2025  Lactic acid, plasma  Add-on,   AD     02/17/19 2024   02/17/19 2025  Protime-INR  Add-on,   AD     02/17/19 2024          Vitals/Pain Today's Vitals   02/17/19 1930 02/17/19 2000 02/17/19 2030 02/17/19 2043  BP: (!) 113/56 124/71 (!) 139/96   Pulse: 92 94 92   Resp:  16 17   Temp:  TempSrc:      SpO2: 95% 96% 97%   PainSc:    7     Isolation Precautions No active isolations  Medications Medications  sodium chloride (PF) 0.9 % injection (has no administration in time range)  enoxaparin (LOVENOX) injection 40 mg (has no administration in time range)  0.9 %  sodium chloride infusion (has no administration in time range)  acetaminophen (TYLENOL) tablet 650 mg (has no administration in time range)    Or  acetaminophen (TYLENOL) suppository 650 mg (has no administration in  time range)  morphine 4 MG/ML injection 4 mg (4 mg Intravenous Given 02/17/19 1825)  ondansetron (ZOFRAN) injection 4 mg (4 mg Intravenous Given 02/17/19 1820)  sodium chloride 0.9 % bolus 1,000 mL (0 mLs Intravenous Stopped 02/17/19 2118)  iohexol (OMNIPAQUE) 300 MG/ML solution 100 mL (100 mLs Intravenous Contrast Given 02/17/19 1840)  ondansetron (ZOFRAN) injection 4 mg (4 mg Intravenous Given 02/17/19 2025)    Mobility walks

## 2019-02-17 NOTE — ED Provider Notes (Signed)
[facility] DEPT Provider Note   CSN: [id number] Arrival date & time: 02/17/19  1526    History   Chief Complaint Chief Complaint  Patient presents with  . Abdominal Pain    HPI Ashley Howard is a 73 y.o. female.     Pt presents to the ED today with abdominal pain and n/v.  The pt said she has a hx of SBO and thinks she has another one.  The pt said sx started this morning around 0900.     Past Medical History:  Diagnosis Date  . Anemia   . Arthritis    "qwhere" (08/21/2016)  . Aspiration pneumonia ([place]) 07/2016  . Bowel incontinence   . Chronic back pain   . Chronic bronchitis ([place])   . Chronic low back pain   . Fluid retention in legs    wears compression stocking R leg  . Gait abnormality   . Gallstones   . GERD (gastroesophageal reflux disease)   . H/O hiatal hernia   . H/O small bowel obstruction 07/2016   admitted to [facility] 1/2 -07/24/16 with SBO which caused aspiration pneumonia and septic shock/notes 08/21/2016  . Hearing loss    mild  . Hemorrhoid   . History of blood transfusion 1975   "had a reaction"  . Incontinence of urine    wears adult briefs  . Pain, dental   . PONV (postoperative nausea and vomiting)    "doesn't bother me so bad anymore" (08/21/2016)  . Poor historian   . Protein calorie malnutrition ([place])   . Scoliosis   . Scoliosis   . Seasonal allergies   . Shortness of breath    with pain  . Small bowel obstruction ([place]) 08/21/2016  . Status post partial amputation of foot, left ([place])   . Weight loss, unintentional    40 lbs in last year    Patient Active Problem List   Diagnosis Date Noted  . Hyponatremia 12/30/2018  . T12 compression fracture ([place]) 12/30/2018  . HCAP (healthcare-associated pneumonia) 12/30/2018  . Anemia   . Rectal bleeding   . SBO (small bowel obstruction) ([place]) 08/21/2016  . Choledocholithiasis 08/21/2016  . Urinary incontinence 08/21/2016  . GERD (gastroesophageal reflux  disease) 08/21/2016  . Neuropathic pain 08/21/2016  . Hyperglycemia 08/21/2016  . History of aspiration pneumonia 07/30/2016  . Septic shock ([place])   . Protein-calorie malnutrition, severe 07/20/2016  . Small bowel obstruction ([place]) 07/16/2016  . Dehydration 07/16/2016  . Abnormality of gait 01/04/2013    Past Surgical History:  Procedure Laterality Date  . ABDOMINAL HYSTERECTOMY  07/1973   "still have one of my ovaries"  . AMPUTATION  11/07/2011   Procedure: AMPUTATION RAY;  Surgeon: Newt Minion, MD;  Location: [place];  Service: Orthopedics;  Laterality: Left;  Left Foot 1st Ray Amputation  . APPENDECTOMY    . BLADDER REPAIR  ~ 02/1974   "hole in my bladder & a place that hadn't healed up from 07/1973 OR for hysterectomy"  . BREAST LUMPECTOMY Bilateral 07/1987   "benign"  . CATARACT EXTRACTION W/ INTRAOCULAR LENS  IMPLANT, BILATERAL Bilateral   . CHOLECYSTECTOMY N/A 08/26/2016   Procedure: LAPAROSCOPIC CHOLECYSTECTOMY;  Surgeon: Georganna Skeans, MD;  Location: [place];  Service: General;  Laterality: N/A;  . COLONOSCOPY WITH PROPOFOL N/A 07/18/2017   Procedure: COLONOSCOPY WITH PROPOFOL;  Surgeon: Doran Stabler, MD;  Location: [facility];  Service: Gastroenterology;  Laterality: N/A;  . DOBUTAMINE STRESS ECHO    .  ERCP N/A 08/24/2016   Procedure: ENDOSCOPIC RETROGRADE CHOLANGIOPANCREATOGRAPHY (ERCP);  Surgeon: Milus Banister, MD;  Location: [place];  Service: Endoscopy;  Laterality: N/A;  . EXPLORATORY LAPAROTOMY  07/1973   "to straighten out my guts 7 days after hysterectomy"  . KNEE ARTHROSCOPY Left   . LAPAROSCOPIC LYSIS OF ADHESIONS N/A 08/26/2016   Procedure: LAPAROSCOPIC LYSIS OF ADHESIONS FOR 20 MINUTES;  Surgeon: Georganna Skeans, MD;  Location: [place];  Service: General;  Laterality: N/A;  . LESION REMOVAL N/A 10/02/2017   Procedure: EXCISION OF ANAL POLYPOID LESION;  Surgeon: Ileana Roup, MD;  Location: [facility];  Service: General;  Laterality: N/A;  . RECTAL EXAM  UNDER ANESTHESIA N/A 10/02/2017   Procedure: RECTAL EXAM UNDER ANESTHESIA;  Surgeon: Ileana Roup, MD;  Location: [facility];  Service: General;  Laterality: N/A;  . TONSILLECTOMY AND ADENOIDECTOMY       OB History    Gravida  2   Para  2   Term      Preterm      AB      Living  1     SAB      TAB      Ectopic      Multiple      Live Births           Obstetric Comments  1 SON PASSED AWAY IN 1992         Home Medications    Prior to Admission medications   Medication Sig Start Date End Date Taking? Authorizing Provider  acetaminophen (TYLENOL) 500 MG tablet Take 500 mg by mouth every 8 (eight) hours as needed.    [provider]  alum & mag hydroxide-simeth (MAALOX/MYLANTA) 200-200-20 MG/5ML suspension Take 30 mLs by mouth every 6 (six) hours as needed for indigestion or heartburn.    [provider]  Calcium Carb-Cholecalciferol (CALCIUM 500 + D3) 500-600 MG-UNIT TABS Take 1 tablet by mouth 2 (two) times daily. (0800 & 2000)    [provider]  docusate sodium (COLACE) 100 MG capsule Take 100 mg by mouth daily. (0800)    [provider]  gabapentin (NEURONTIN) 100 MG capsule Take 100 mg by mouth at bedtime. (2000)    [provider]  loratadine (CLARITIN) 10 MG tablet Take 10 mg by mouth daily.    [provider]  LORazepam (ATIVAN) 0.5 MG tablet Take 1 tablet (0.5 mg total) by mouth every 6 (six) hours as needed for anxiety. Take 0.5 mg by mouth at bedtime. May also take twice daily as needed for anxiety. 01/10/19   Spongberg, Audie Pinto, MD  meloxicam (MOBIC) 15 MG tablet Take 15 mg by mouth daily. (0800)    [provider]  mirabegron ER (MYRBETRIQ) 25 MG TB24 tablet Take 25 mg by mouth daily. 0800    [provider]  Multiple Vitamin (MULTIVITAMIN WITH MINERALS) TABS tablet Take 1 tablet by mouth daily. (0800)    [provider]  omeprazole (PRILOSEC) 20 MG capsule Take 20 mg  by mouth daily. (0800)    [provider]  ondansetron (ZOFRAN) 4 MG tablet Take 1 tablet (4 mg total) by mouth daily as needed for nausea or vomiting. 01/10/19 01/10/20  Spongberg, Audie Pinto, MD  Psyllium (KONSYL DAILY FIBER) 28.3 % POWD Take 10 mLs by mouth daily. Mix in 8 ounces of water    [provider]  risedronate (ACTONEL) 35 MG tablet Take 35 mg by mouth every Wednesday.  Mix with 8 oz of water. Remain upright and no food/drink for 30 min. Do not crush    [provider]  simethicone (MYLICON) 161 MG chewable tablet Chew 125 mg by mouth 2 (two) times daily. (0800 & 1800)    [provider]  triamcinolone ointment (KENALOG) 0.1 % Apply 1 application topically 2 (two) times a day. 11/16/18   [provider]    Family History Family History  Problem Relation Age of Onset  . Hypertension Mother   . Cancer Father   . Diabetes Neg Hx   . Stroke Neg Hx     Social History Social History   Tobacco Use  . Smoking status: Former Smoker    Packs/day: 1.00    Years: 54.00    Pack years: 54.00    Types: Cigarettes    Quit date: 07/15/2016    Years since quitting: 2.5  . Smokeless tobacco: Never Used  Substance Use Topics  . Alcohol use: No    Comment: 08/21/2016 "nothing since ~ 04/2016"  . Drug use: No     Allergies   Tape   Review of Systems Review of Systems  Gastrointestinal: Positive for abdominal pain, nausea and vomiting.  All other systems reviewed and are negative.    Physical Exam Updated Vital Signs BP (!) 145/83   Pulse 91   Temp (!) 97.4 F (36.3 C) (Oral)   Resp 18   SpO2 94%   Physical Exam Vitals signs and nursing note reviewed.  Constitutional:      Appearance: Normal appearance.  HENT:     Head: Normocephalic and atraumatic.     Right Ear: External ear normal.     Left Ear: External ear normal.     Nose: Nose normal.     Mouth/Throat:     Mouth: Mucous membranes are moist.     Pharynx: Oropharynx  is clear.  Eyes:     Extraocular Movements: Extraocular movements intact.     Conjunctiva/sclera: Conjunctivae normal.     Pupils: Pupils are equal, round, and reactive to light.  Neck:     Musculoskeletal: Normal range of motion and neck supple.  Cardiovascular:     Rate and Rhythm: Normal rate and regular rhythm.     Pulses: Normal pulses.     Heart sounds: Normal heart sounds.  Pulmonary:     Effort: Pulmonary effort is normal.     Breath sounds: Normal breath sounds.  Abdominal:     General: Abdomen is flat. Bowel sounds are normal.     Palpations: Abdomen is soft.     Tenderness: There is generalized abdominal tenderness.  Musculoskeletal: Normal range of motion.  Skin:    General: Skin is warm.     Capillary Refill: Capillary refill takes less than 2 seconds.  Neurological:     General: No focal deficit present.     Mental Status: She is alert and oriented to person, place, and time.  Psychiatric:        Mood and Affect: Mood normal.        Behavior: Behavior normal.        Thought Content: Thought content normal.        Judgment: Judgment normal.      ED Treatments / Results  Labs (all labs ordered are listed, but only abnormal results are displayed) Labs Reviewed  CBC WITH DIFFERENTIAL/PLATELET - Abnormal; Notable for the following components:      Result Value   WBC  15.3 (*)    Neutro Abs 13.6 (*)    Abs Immature Granulocytes 0.08 (*)    All other components within normal limits  COMPREHENSIVE METABOLIC PANEL - Abnormal; Notable for the following components:   Sodium 131 (*)    Chloride 96 (*)    CO2 21 (*)    Glucose, Bld 133 (*)    GFR calc non Af Amer 57 (*)    All other components within normal limits  SARS CORONAVIRUS 2 (HOSPITAL ORDER, [facility])  LIPASE, BLOOD  URINALYSIS, ROUTINE W REFLEX MICROSCOPIC  COMPREHENSIVE METABOLIC PANEL  CBC    EKG None  Radiology Ct Abdomen Pelvis W Contrast  Result Date:  02/17/2019 CLINICAL DATA:  Abdominal pain with nausea EXAM: CT ABDOMEN AND PELVIS WITH CONTRAST TECHNIQUE: Multidetector CT imaging of the abdomen and pelvis was performed using the standard protocol following bolus administration of intravenous contrast. CONTRAST:  189mL OMNIPAQUE IOHEXOL 300 MG/ML  SOLN COMPARISON:  01/08/2019 radiograph, CT 12/30/2018 FINDINGS: Lower chest: Lung bases demonstrate no consolidation or pleural effusion. The heart size is within normal limits. Large hiatal hernia. Hepatobiliary: Status post cholecystectomy. No focal hepatic abnormality. Stable mild intra and moderate extrahepatic biliary dilatation. Pancreas: Unremarkable. No pancreatic ductal dilatation or surrounding inflammatory changes. Spleen: Normal in size without focal abnormality. Adrenals/Urinary Tract: Adrenal glands are unremarkable. Kidneys are normal, without renal calculi, focal lesion, or hydronephrosis. Bladder is unremarkable. Stomach/Bowel: Mild fluid-filled stomach. Multiple dilated loops of fluid-filled small bowel, measuring up to 7 cm in the left lower quadrant with abrupt transition to decompressed distal small bowel and colon consistent with mechanical small bowel obstruction, high-grade. Transition point within the mid pelvis, slightly to the right of midline, series 2, image number 50. No bowel wall thickening. History of appendectomy. Descending and sigmoid colon diverticular disease without acute inflammatory change. Vascular/Lymphatic: Moderate aortic atherosclerosis. No aneurysm. No significantly enlarged lymph nodes. Reproductive: Status post hysterectomy. No adnexal masses. Other: Negative for free air or free fluid Musculoskeletal: Scoliosis of the spine. Chronic compression deformity T12. Mild chronic superior endplate deformity at L2. Grade 1 anterolisthesis L3 on L4 and L4 on L5. Diffuse degenerative changes. IMPRESSION: 1. Multiple markedly dilated fluid-filled loops of small bowel with abrupt  transition to decompressed mid to distal small, findings are consistent with high-grade small bowel obstruction. Transition point visualized within the mid pelvis, slightly to the right of midline. Negative for free air 2. Status post cholecystectomy. Stable intra and extrahepatic biliary dilatation 3. Descending and sigmoid colon diverticular disease without acute inflammatory change Electronically Signed   By: Donavan Foil M.D.   On: 02/17/2019 19:26    Procedures Procedures (including critical care time)  Medications Ordered in ED Medications  sodium chloride (PF) 0.9 % injection (has no administration in time range)  ondansetron (ZOFRAN) injection 4 mg (has no administration in time range)  enoxaparin (LOVENOX) injection 40 mg (has no administration in time range)  0.9 %  sodium chloride infusion (has no administration in time range)  acetaminophen (TYLENOL) tablet 650 mg (has no administration in time range)    Or  acetaminophen (TYLENOL) suppository 650 mg (has no administration in time range)  morphine 4 MG/ML injection 4 mg (4 mg Intravenous Given 02/17/19 1825)  ondansetron (ZOFRAN) injection 4 mg (4 mg Intravenous Given 02/17/19 1820)  sodium chloride 0.9 % bolus 1,000 mL (1,000 mLs Intravenous New Bag/Given (Non-Interop) 02/17/19 1820)  iohexol (OMNIPAQUE) 300 MG/ML solution 100 mL (100 mLs Intravenous Contrast  Given 02/17/19 1840)     Initial Impression / Assessment and Plan / ED Course  I have reviewed the triage vital signs and the nursing notes.  Pertinent labs & imaging results that were available during my care of the patient were reviewed by me and considered in my medical decision making (see chart for details).      Pt does have a SBO on CT scan.  She had a NG placed.  Pt d/w Dr. Marcello Moores (surgery).  She agrees with plan.   Pt d/w Dr. Maudie Mercury (triad) for admission.  Pt's son, [name], updated per pt request.  Covid negative.  JULIANI LADUKE was evaluated in Emergency  Department on 02/17/2019 for the symptoms described in the history of present illness. She was evaluated in the context of the global COVID-19 pandemic, which necessitated consideration that the patient might be at risk for infection with the SARS-CoV-2 virus that causes COVID-19. Institutional protocols and algorithms that pertain to the evaluation of patients at risk for COVID-19 are in a state of rapid change based on information released by regulatory bodies including the CDC and federal and state organizations. These policies and algorithms were followed during the patient's care in the ED.  Final Clinical Impressions(s) / ED Diagnoses   Final diagnoses:  Small bowel obstruction ([place])  Covid-19 Virus not Detected    ED Discharge Orders    None       Isla Pence, MD 02/17/19 2015

## 2019-02-18 ENCOUNTER — Inpatient Hospital Stay (HOSPITAL_COMMUNITY): Payer: Medicare Other

## 2019-02-18 ENCOUNTER — Encounter (HOSPITAL_COMMUNITY): Payer: Self-pay

## 2019-02-18 DIAGNOSIS — Z978 Presence of other specified devices: Secondary | ICD-10-CM

## 2019-02-18 LAB — CBC
HCT: 36.9 % (ref 36.0–46.0)
Hemoglobin: 12.2 g/dL (ref 12.0–15.0)
MCH: 32.2 pg (ref 26.0–34.0)
MCHC: 33.1 g/dL (ref 30.0–36.0)
MCV: 97.4 fL (ref 80.0–100.0)
Platelets: 286 10*3/uL (ref 150–400)
RBC: 3.79 MIL/uL — ABNORMAL LOW (ref 3.87–5.11)
RDW: 12.4 % (ref 11.5–15.5)
WBC: 6.8 10*3/uL (ref 4.0–10.5)
nRBC: 0 % (ref 0.0–0.2)

## 2019-02-18 LAB — COMPREHENSIVE METABOLIC PANEL
ALT: 16 U/L (ref 0–44)
AST: 21 U/L (ref 15–41)
Albumin: 3.5 g/dL (ref 3.5–5.0)
Alkaline Phosphatase: 60 U/L (ref 38–126)
Anion gap: 10 (ref 5–15)
BUN: 20 mg/dL (ref 8–23)
CO2: 22 mmol/L (ref 22–32)
Calcium: 8.7 mg/dL — ABNORMAL LOW (ref 8.9–10.3)
Chloride: 100 mmol/L (ref 98–111)
Creatinine, Ser: 0.9 mg/dL (ref 0.44–1.00)
GFR calc Af Amer: >60 mL/min (ref >60)
GFR calc non Af Amer: >60 mL/min (ref >60)
Glucose, Bld: 119 mg/dL — ABNORMAL HIGH (ref 70–99)
Potassium: 4.1 mmol/L (ref 3.5–5.1)
Sodium: 132 mmol/L — ABNORMAL LOW (ref 135–145)
Total Bilirubin: 0.9 mg/dL (ref 0.3–1.2)
Total Protein: 6.2 g/dL — ABNORMAL LOW (ref 6.5–8.1)

## 2019-02-18 LAB — MRSA PCR SCREENING: MRSA by PCR: NEGATIVE

## 2019-02-18 MED ORDER — DIATRIZOATE MEGLUMINE & SODIUM 66-10 % PO SOLN
90.0000 mL | Freq: Once | ORAL | Status: AC
  Administered 2019-02-18: 90 mL via NASOGASTRIC
  Filled 2019-02-18: qty 90

## 2019-02-18 MED ORDER — PNEUMOCOCCAL VAC POLYVALENT 25 MCG/0.5ML IJ INJ
0.5000 mL | INJECTION | INTRAMUSCULAR | Status: AC
  Administered 2019-02-19: 0.5 mL via INTRAMUSCULAR
  Filled 2019-02-18: qty 0.5

## 2019-02-18 MED ORDER — MENTHOL 3 MG MT LOZG
1.0000 | LOZENGE | OROMUCOSAL | Status: DC | PRN
  Filled 2019-02-18: qty 9

## 2019-02-18 NOTE — Progress Notes (Addendum)
[facility]/Trauma Progress Note      Assessment/Plan Hx of abdominal hysterectomy (~1975_ with subsequent surgeries 2/2 complications  Hx of lap chole and LOA 08/2016, Dr. Georganna Skeans  SBO - protocol in place - will follow up on delayed films - asked nurse to advance tube and check placement with AXR  FEN: NPO, NGT, IVF VTE: SCD's, lovenox ID: Rocephin qhs Foley: none Follow up: TBD    LOS: 1 day    Subjective: CC: abdominal pain  Pt has been clamped for about 40 min since gastrografin administration. She is having some nausea and intermittent abdominal cramping. No flatus overnight. No BM.   Objective: Vital signs in last 24 hours: Temp:  [97.4 F (36.3 C)-99.2 F (37.3 C)] 99.2 F (37.3 C) (08/06 0543) Pulse Rate:  [77-99] 81 (08/06 0543) Resp:  [16-18] 16 (08/06 0543) BP: (108-155)/(56-96) 120/68 (08/06 0543) SpO2:  [92 %-97 %] 92 % (08/06 0543) Weight:  [60.3 kg-60.9 kg] 60.9 kg (08/06 0543)    Intake/Output from previous day: 08/05 0701 - 08/06 0700 In: 1000 [IV Piggyback:1000] Out: -  Intake/Output this shift: No intake/output data recorded.  PE: Gen:  Alert, NAD, pleasant, cooperative HEENT: NGT in place, clamped Pulm:  Rate and effort normal Abd: Soft, mild distention, no BS appreciated, generalized TTP worse in upper abdomen with guarding. No peritonitis  Skin: no rashes noted, warm and dry   Anti-infectives: Anti-infectives (From admission, onward)   Start     Dose/Rate Route Frequency Ordered Stop   02/17/19 2315  cefTRIAXone (ROCEPHIN) 1 g in sodium chloride 0.9 % 100 mL IVPB     1 g 200 mL/hr over 30 Minutes Intravenous Daily at bedtime 02/17/19 2311        Lab Results:  Recent Labs    02/17/19 1721 02/18/19 0403  WBC 15.3* 6.8  HGB 14.8 12.2  HCT 43.8 36.9  PLT 308 286   BMET Recent Labs    02/17/19 1721 02/18/19 0403  NA 131* 132*  K 4.3 4.1  CL 96* 100  CO2 21* 22  GLUCOSE 133* 119*  BUN 18 20   CREATININE 0.97 0.90  CALCIUM 10.2 8.7*   PT/INR Recent Labs    02/17/19 2228  LABPROT 11.8  INR 0.9   CMP     Component Value Date/Time   NA 132 (L) 02/18/2019 0403   NA 132 (A) 09/18/2016   K 4.1 02/18/2019 0403   CL 100 02/18/2019 0403   CO2 22 02/18/2019 0403   GLUCOSE 119 (H) 02/18/2019 0403   BUN 20 02/18/2019 0403   BUN 17 09/18/2016   CREATININE 0.90 02/18/2019 0403   CALCIUM 8.7 (L) 02/18/2019 0403   PROT 6.2 (L) 02/18/2019 0403   ALBUMIN 3.5 02/18/2019 0403   AST 21 02/18/2019 0403   ALT 16 02/18/2019 0403   ALKPHOS 60 02/18/2019 0403   BILITOT 0.9 02/18/2019 0403   GFRNONAA >60 02/18/2019 0403   GFRAA >60 02/18/2019 0403   Lipase     Component Value Date/Time   LIPASE 33 02/17/2019 1721    Studies/Results: Ct Abdomen Pelvis W Contrast  Result Date: 02/17/2019 CLINICAL DATA:  Abdominal pain with nausea EXAM: CT ABDOMEN AND PELVIS WITH CONTRAST TECHNIQUE: Multidetector CT imaging of the abdomen and pelvis was performed using the standard protocol following bolus administration of intravenous contrast. CONTRAST:  170mL OMNIPAQUE IOHEXOL 300 MG/ML  SOLN COMPARISON:  01/08/2019 radiograph, CT 12/30/2018 FINDINGS: Lower chest: Lung bases demonstrate no consolidation or pleural  effusion. The heart size is within normal limits. Large hiatal hernia. Hepatobiliary: Status post cholecystectomy. No focal hepatic abnormality. Stable mild intra and moderate extrahepatic biliary dilatation. Pancreas: Unremarkable. No pancreatic ductal dilatation or surrounding inflammatory changes. Spleen: Normal in size without focal abnormality. Adrenals/Urinary Tract: Adrenal glands are unremarkable. Kidneys are normal, without renal calculi, focal lesion, or hydronephrosis. Bladder is unremarkable. Stomach/Bowel: Mild fluid-filled stomach. Multiple dilated loops of fluid-filled small bowel, measuring up to 7 cm in the left lower quadrant with abrupt transition to decompressed distal small  bowel and colon consistent with mechanical small bowel obstruction, high-grade. Transition point within the mid pelvis, slightly to the right of midline, series 2, image number 50. No bowel wall thickening. History of appendectomy. Descending and sigmoid colon diverticular disease without acute inflammatory change. Vascular/Lymphatic: Moderate aortic atherosclerosis. No aneurysm. No significantly enlarged lymph nodes. Reproductive: Status post hysterectomy. No adnexal masses. Other: Negative for free air or free fluid Musculoskeletal: Scoliosis of the spine. Chronic compression deformity T12. Mild chronic superior endplate deformity at L2. Grade 1 anterolisthesis L3 on L4 and L4 on L5. Diffuse degenerative changes. IMPRESSION: 1. Multiple markedly dilated fluid-filled loops of small bowel with abrupt transition to decompressed mid to distal small, findings are consistent with high-grade small bowel obstruction. Transition point visualized within the mid pelvis, slightly to the right of midline. Negative for free air 2. Status post cholecystectomy. Stable intra and extrahepatic biliary dilatation 3. Descending and sigmoid colon diverticular disease without acute inflammatory change Electronically Signed   By: Donavan Foil M.D.   On: 02/17/2019 19:26   Dg Abd Portable 1v  Result Date: 02/18/2019 CLINICAL DATA:  NG tube placement EXAM: PORTABLE ABDOMEN - 1 VIEW COMPARISON:  CT 02/17/2019 FINDINGS: NG tube is in place with the tip in the proximal stomach. The side port is near the GE junction. Dilated small bowel loops again noted compatible with small bowel obstruction. IMPRESSION: NG tube tip in the proximal stomach. Electronically Signed   By: Rolm Baptise M.D.   On: 02/18/2019 02:59   Dg Abd Portable 1 View  Result Date: 02/17/2019 CLINICAL DATA:  Replacement of NG tube EXAM: PORTABLE ABDOMEN - 1 VIEW COMPARISON:  02/17/2019, CT 02/17/2019 FINDINGS: Esophageal tube side port at the level of hiatal hernia in  the distal mediastinum with tip at the level of the diaphragmatic hiatus. Dilated small bowel consistent with obstruction. Contrast within the renal collecting systems IMPRESSION: Esophageal tube tip is partially coiled over the distal mediastinum within the patient's hiatal hernia, the tip is at the level of diaphragmatic hiatus. Further advancement and repositioning is recommended. Electronically Signed   By: Donavan Foil M.D.   On: 02/17/2019 21:45   Dg Abd Portable 1 View  Result Date: 02/17/2019 CLINICAL DATA:  Check NG placement EXAM: PORTABLE ABDOMEN - 1 VIEW COMPARISON:  Film from earlier in the same day. FINDINGS: Nasogastric catheter is again identified now coiled within the distal esophagus with the tip at the level of the aortic knob. This should be removed completely and readvanced. Cardiac shadow is within normal limits. Contrast from recent CT is noted. IMPRESSION: Nasogastric catheter coiled within the esophagus as described. This should be removed and readvanced. Electronically Signed   By: Inez Catalina M.D.   On: 02/17/2019 21:38   Dg Abd Portable 1 View  Result Date: 02/17/2019 CLINICAL DATA:  NG tube placement. Small bowel obstruction. EXAM: PORTABLE ABDOMEN - 1 VIEW COMPARISON:  CT scan dated 02/17/2019 FINDINGS: The NG tube tip  is at the level of the diaphragm. The patient has a known large hiatal hernia with narrowing of the gastric lumen at the level of the diaphragmatic hiatus as demonstrated on the prior CT scan. Persistent dilated small bowel loops. Contrast seen in the kidneys and bladder from recent CT scan. Thoracolumbar scoliosis. IMPRESSION: NG tube tip at the level of the diaphragm likely in the hiatal hernia above the diaphragmatic hiatus. Persistent small bowel obstruction. Critical Value/emergent results were called by telephone at the time of interpretation on 02/17/2019 at 9:02 pm to Dr. Gilford Raid, who verbally acknowledged these results. Electronically Signed   By: Lorriane Shire M.D.   On: 02/17/2019 21:04      Kalman Drape , [facility] 02/18/2019, 9:21 AM  Pager: [phone] Mon-Wed, Friday 7:00am-4:30pm Thurs 7am-11:30am  Consults: [phone]

## 2019-02-18 NOTE — Progress Notes (Signed)
[facility]  PROGRESS NOTE  Ashley Howard NWG:[id number] DOB: 1946-06-16 DOA: 02/17/2019 PCP: Lynnell Catalan, FNP   Brief HPI:   73 year old female with a history of chronic back pain, GERD, history of SBO, hysterectomy, appendectomy came to the hospital with abdominal pain nausea after breakfast.  Also had 3 episodes of vomiting.  CT abdomen pelvis showed multiple markedly dilated fluid-filled loops of small bowel with abrupt transition to decompressed mid to distal small findings consistent with high-grade small bowel obstruction. NG tube was inserted  Subjective   Patient seen and examined, NG tube with low intermittent suction in place   Assessment/Plan:    1. Small bowel obstruction-patient has history of multiple surgeries in the past, likely cause of SBO.  NG tube to intermittent suction in place, morphine as needed, Zofran as needed for vomiting.  Appreciate surgical consultation.  Small bowel follow-through protocol in place.  2. GERD-continue Protonix  3. Hyponatremia-likely from GI losses, sodium 132 today.  Follow BMP in a.m.  4. UTI-patient has a normal UA, started on Rocephin 1 g IV daily.  Urine culture obtained.   CBC: Recent Labs  Lab 02/17/19 1721 02/18/19 0403  WBC 15.3* 6.8  NEUTROABS 13.6*  --   HGB 14.8 12.2  HCT 43.8 36.9  MCV 98.0 97.4  PLT 308 578    Basic Metabolic Panel: Recent Labs  Lab 02/17/19 1721 02/18/19 0403  NA 131* 132*  K 4.3 4.1  CL 96* 100  CO2 21* 22  GLUCOSE 133* 119*  BUN 18 20  CREATININE 0.97 0.90  CALCIUM 10.2 8.7*     DVT prophylaxis: Lovenox  Code Status: Full code  Family Communication: No family at bedside  Disposition Plan: likely home when medically ready for discharge   Scheduled medications:  . enoxaparin (LOVENOX) injection  40 mg Subcutaneous Q24H  . pantoprazole (PROTONIX) IV  40 mg Intravenous QHS  . [START ON 02/19/2019] pneumococcal 23 valent vaccine  0.5 mL Intramuscular Tomorrow-1000     Consultants:  Surgery  Procedures:     Antibiotics:   Anti-infectives (From admission, onward)   Start     Dose/Rate Route Frequency Ordered Stop   02/17/19 2315  cefTRIAXone (ROCEPHIN) 1 g in sodium chloride 0.9 % 100 mL IVPB     1 g 200 mL/hr over 30 Minutes Intravenous Daily at bedtime 02/17/19 2311         Objective   Vitals:   02/17/19 2213 02/17/19 2217 02/18/19 0543 02/18/19 1433  BP:  122/66 120/68 136/84  Pulse:  77 81 98  Resp:  16 16 20   Temp:  98.6 F (37 C) 99.2 F (37.3 C) 98.7 F (37.1 C)  TempSrc:  Oral Oral Oral  SpO2:  97% 92% 94%  Weight: 60.3 kg  60.9 kg   Height: 4\' 11"  (1.499 m)       Intake/Output Summary (Last 24 hours) at 02/18/2019 1450 Last data filed at 02/18/2019 1006 Gross per 24 hour  Intake 1939.37 ml  Output 200 ml  Net 1739.37 ml   Filed Weights   02/17/19 2213 02/18/19 0543  Weight: 60.3 kg 60.9 kg     Physical Examination:    General: Appears in no acute distress, NG tube in place  Cardiovascular: S1-S2, regular, no murmur auscultated  Respiratory: Clear to auscultation bilaterally  Abdomen: Abdomen is soft, mild generalized tenderness to palpation, no organomegaly  Extremities: No edema in the lower extremities  Neurologic: Alert, oriented x3, no focal deficit noted  Data Reviewed: I have personally reviewed following labs and imaging studies   Recent Results (from the past 240 hour(s))  SARS Coronavirus 2 [facility] order, Performed in [facility] hospital lab) Nasopharyngeal Nasopharyngeal Swab     Status: None   Collection Time: 02/17/19  3:56 PM   Specimen: Nasopharyngeal Swab  Result Value Ref Range Status   SARS Coronavirus 2 NEGATIVE NEGATIVE Final    Comment: (NOTE) If result is NEGATIVE SARS-CoV-2 target nucleic acids are NOT DETECTED. The SARS-CoV-2 RNA is generally detectable in upper and lower  respiratory specimens during the acute phase of infection. The lowest  concentration of  SARS-CoV-2 viral copies this assay can detect is 250  copies / mL. A negative result does not preclude SARS-CoV-2 infection  and should not be used as the sole basis for treatment or other  patient management decisions.  A negative result may occur with  improper specimen collection / handling, submission of specimen other  than nasopharyngeal swab, presence of viral mutation(s) within the  areas targeted by this assay, and inadequate number of viral copies  (<250 copies / mL). A negative result must be combined with clinical  observations, patient history, and epidemiological information. If result is POSITIVE SARS-CoV-2 target nucleic acids are DETECTED. The SARS-CoV-2 RNA is generally detectable in upper and lower  respiratory specimens dur ing the acute phase of infection.  Positive  results are indicative of active infection with SARS-CoV-2.  Clinical  correlation with patient history and other diagnostic information is  necessary to determine patient infection status.  Positive results do  not rule out bacterial infection or co-infection with other viruses. If result is PRESUMPTIVE POSTIVE SARS-CoV-2 nucleic acids MAY BE PRESENT.   A presumptive positive result was obtained on the submitted specimen  and confirmed on repeat testing.  While 2019 novel coronavirus  (SARS-CoV-2) nucleic acids may be present in the submitted sample  additional confirmatory testing may be necessary for epidemiological  and / or clinical management purposes  to differentiate between  SARS-CoV-2 and other Sarbecovirus currently known to infect humans.  If clinically indicated additional testing with an alternate test  methodology [phone]) is advised. The SARS-CoV-2 RNA is generally  detectable in upper and lower respiratory sp ecimens during the acute  phase of infection. The expected result is Negative. Fact Sheet for Patients:  StrictlyIdeas.no Fact Sheet for Healthcare  Providers: BankingDealers.co.za This test is not yet approved or cleared by the [place] FDA and has been authorized for detection and/or diagnosis of SARS-CoV-2 by FDA under an Emergency Use Authorization (EUA).  This EUA will remain in effect (meaning this test can be used) for the duration of the COVID-19 declaration under Section 564(b)(1) of the Act, 21 U.S.C. section 360bbb-3(b)(1), unless the authorization is terminated or revoked sooner. Performed at [facility], [place] [address] [place], [place] [address]   MRSA PCR Screening     Status: None   Collection Time: 02/18/19 12:25 AM   Specimen: Nasal Mucosa; Nasopharyngeal  Result Value Ref Range Status   MRSA by PCR NEGATIVE NEGATIVE Final    Comment:        The GeneXpert MRSA Assay (FDA approved for NASAL specimens only), is one component of a comprehensive MRSA colonization surveillance program. It is not intended to diagnose MRSA infection nor to guide or monitor treatment for MRSA infections. Performed at [facility], [place] [address] [place], [place] [address]      Liver Function Tests:  Recent Labs  Lab 02/17/19 1721 02/18/19 0403  AST 25 21  ALT 17 16  ALKPHOS 75 60  BILITOT 0.6 0.9  PROT 7.5 6.2*  ALBUMIN 4.3 3.5   Recent Labs  Lab 02/17/19 1721  LIPASE 33   No results for input(s): AMMONIA in the last 168 hours.  Cardiac Enzymes: No results for input(s): CKTOTAL, CKMB, CKMBINDEX, TROPONINI in the last 168 hours. BNP (last 3 results) No results for input(s): BNP in the last 8760 hours.  ProBNP (last 3 results) No results for input(s): PROBNP in the last 8760 hours.    Studies: Ct Abdomen Pelvis W Contrast  Result Date: 02/17/2019 CLINICAL DATA:  Abdominal pain with nausea EXAM: CT ABDOMEN AND PELVIS WITH CONTRAST TECHNIQUE: Multidetector CT imaging of the abdomen and pelvis was performed using the standard protocol following  bolus administration of intravenous contrast. CONTRAST:  135mL OMNIPAQUE IOHEXOL 300 MG/ML  SOLN COMPARISON:  01/08/2019 radiograph, CT 12/30/2018 FINDINGS: Lower chest: Lung bases demonstrate no consolidation or pleural effusion. The heart size is within normal limits. Large hiatal hernia. Hepatobiliary: Status post cholecystectomy. No focal hepatic abnormality. Stable mild intra and moderate extrahepatic biliary dilatation. Pancreas: Unremarkable. No pancreatic ductal dilatation or surrounding inflammatory changes. Spleen: Normal in size without focal abnormality. Adrenals/Urinary Tract: Adrenal glands are unremarkable. Kidneys are normal, without renal calculi, focal lesion, or hydronephrosis. Bladder is unremarkable. Stomach/Bowel: Mild fluid-filled stomach. Multiple dilated loops of fluid-filled small bowel, measuring up to 7 cm in the left lower quadrant with abrupt transition to decompressed distal small bowel and colon consistent with mechanical small bowel obstruction, high-grade. Transition point within the mid pelvis, slightly to the right of midline, series 2, image number 50. No bowel wall thickening. History of appendectomy. Descending and sigmoid colon diverticular disease without acute inflammatory change. Vascular/Lymphatic: Moderate aortic atherosclerosis. No aneurysm. No significantly enlarged lymph nodes. Reproductive: Status post hysterectomy. No adnexal masses. Other: Negative for free air or free fluid Musculoskeletal: Scoliosis of the spine. Chronic compression deformity T12. Mild chronic superior endplate deformity at L2. Grade 1 anterolisthesis L3 on L4 and L4 on L5. Diffuse degenerative changes. IMPRESSION: 1. Multiple markedly dilated fluid-filled loops of small bowel with abrupt transition to decompressed mid to distal small, findings are consistent with high-grade small bowel obstruction. Transition point visualized within the mid pelvis, slightly to the right of midline. Negative for  free air 2. Status post cholecystectomy. Stable intra and extrahepatic biliary dilatation 3. Descending and sigmoid colon diverticular disease without acute inflammatory change Electronically Signed   By: Donavan Foil M.D.   On: 02/17/2019 19:26   Dg Abd Portable 1v  Result Date: 02/18/2019 CLINICAL DATA:  Nasogastric tube placement EXAM: PORTABLE ABDOMEN - 1 VIEW COMPARISON:  Same-day radiograph FINDINGS: Nasogastric tube is in place with distal tip and side hole projecting below the diaphragm within the stomach. Hiatal hernia is noted. Multiple markedly dilated loops of small bowel ago at throughout the abdomen compatible with a small-bowel obstruction. No gross free intraperitoneal air. IMPRESSION: Nasogastric tube in satisfactory positioning. Electronically Signed   By: Davina Poke M.D.   On: 02/18/2019 11:07   Dg Abd Portable 1v  Result Date: 02/18/2019 CLINICAL DATA:  NG tube placement EXAM: PORTABLE ABDOMEN - 1 VIEW COMPARISON:  CT 02/17/2019 FINDINGS: NG tube is in place with the tip in the proximal stomach. The side port is near the GE junction. Dilated small bowel loops again noted compatible with small bowel obstruction. IMPRESSION: NG tube tip in the proximal  stomach. Electronically Signed   By: Rolm Baptise M.D.   On: 02/18/2019 02:59   Dg Abd Portable 1 View  Result Date: 02/17/2019 CLINICAL DATA:  Replacement of NG tube EXAM: PORTABLE ABDOMEN - 1 VIEW COMPARISON:  02/17/2019, CT 02/17/2019 FINDINGS: Esophageal tube side port at the level of hiatal hernia in the distal mediastinum with tip at the level of the diaphragmatic hiatus. Dilated small bowel consistent with obstruction. Contrast within the renal collecting systems IMPRESSION: Esophageal tube tip is partially coiled over the distal mediastinum within the patient's hiatal hernia, the tip is at the level of diaphragmatic hiatus. Further advancement and repositioning is recommended. Electronically Signed   By: Donavan Foil M.D.    On: 02/17/2019 21:45   Dg Abd Portable 1 View  Result Date: 02/17/2019 CLINICAL DATA:  Check NG placement EXAM: PORTABLE ABDOMEN - 1 VIEW COMPARISON:  Film from earlier in the same day. FINDINGS: Nasogastric catheter is again identified now coiled within the distal esophagus with the tip at the level of the aortic knob. This should be removed completely and readvanced. Cardiac shadow is within normal limits. Contrast from recent CT is noted. IMPRESSION: Nasogastric catheter coiled within the esophagus as described. This should be removed and readvanced. Electronically Signed   By: Inez Catalina M.D.   On: 02/17/2019 21:38   Dg Abd Portable 1 View  Result Date: 02/17/2019 CLINICAL DATA:  NG tube placement. Small bowel obstruction. EXAM: PORTABLE ABDOMEN - 1 VIEW COMPARISON:  CT scan dated 02/17/2019 FINDINGS: The NG tube tip is at the level of the diaphragm. The patient has a known large hiatal hernia with narrowing of the gastric lumen at the level of the diaphragmatic hiatus as demonstrated on the prior CT scan. Persistent dilated small bowel loops. Contrast seen in the kidneys and bladder from recent CT scan. Thoracolumbar scoliosis. IMPRESSION: NG tube tip at the level of the diaphragm likely in the hiatal hernia above the diaphragmatic hiatus. Persistent small bowel obstruction. Critical Value/emergent results were called by telephone at the time of interpretation on 02/17/2019 at 9:02 pm to Dr. Gilford Raid, who verbally acknowledged these results. Electronically Signed   By: Lorriane Shire M.D.   On: 02/17/2019 21:04     Admission status: Inpatient: Based on patients clinical presentation and evaluation of above clinical data, I have made determination that patient meets Inpatient criteria at this time.  Time spent: 25 min  [facility] Pager [phone]. If 7PM-7AM, please contact night-coverage at [email or web address], Office  [phone]  password [email or web address]  02/18/2019, 2:50 PM  LOS:  1 day

## 2019-02-18 NOTE — Progress Notes (Signed)
Patient now has a NGT to low intermittent suction. Patient tolerated the NGT insertion very well.

## 2019-02-19 ENCOUNTER — Inpatient Hospital Stay (HOSPITAL_COMMUNITY): Payer: Medicare Other

## 2019-02-19 ENCOUNTER — Inpatient Hospital Stay (HOSPITAL_COMMUNITY): Payer: Medicare Other | Admitting: Anesthesiology

## 2019-02-19 ENCOUNTER — Encounter (HOSPITAL_COMMUNITY): Payer: Self-pay

## 2019-02-19 ENCOUNTER — Encounter (HOSPITAL_COMMUNITY)
Admission: EM | Disposition: A | Discharge status: Skilled Nursing Facility | Payer: Self-pay | Source: Skilled Nursing Facility | Attending: Family Medicine

## 2019-02-19 DIAGNOSIS — M7989 Other specified soft tissue disorders: Secondary | ICD-10-CM

## 2019-02-19 HISTORY — PX: LAPAROSCOPIC LYSIS OF ADHESIONS: SHX5905

## 2019-02-19 LAB — CBC
HCT: 40.9 % (ref 36.0–46.0)
Hemoglobin: 13.3 g/dL (ref 12.0–15.0)
MCH: 32.5 pg (ref 26.0–34.0)
MCHC: 32.5 g/dL (ref 30.0–36.0)
MCV: 100 fL (ref 80.0–100.0)
Platelets: 290 10*3/uL (ref 150–400)
RBC: 4.09 MIL/uL (ref 3.87–5.11)
RDW: 12.4 % (ref 11.5–15.5)
WBC: 6.8 10*3/uL (ref 4.0–10.5)
nRBC: 0 % (ref 0.0–0.2)

## 2019-02-19 LAB — URINE CULTURE: Culture: <10000 — AB

## 2019-02-19 LAB — BASIC METABOLIC PANEL
Anion gap: 10 (ref 5–15)
BUN: 21 mg/dL (ref 8–23)
CO2: 25 mmol/L (ref 22–32)
Calcium: 9.1 mg/dL (ref 8.9–10.3)
Chloride: 104 mmol/L (ref 98–111)
Creatinine, Ser: 0.93 mg/dL (ref 0.44–1.00)
GFR calc Af Amer: >60 mL/min (ref >60)
GFR calc non Af Amer: >60 mL/min (ref >60)
Glucose, Bld: 128 mg/dL — ABNORMAL HIGH (ref 70–99)
Potassium: 4.1 mmol/L (ref 3.5–5.1)
Sodium: 139 mmol/L (ref 135–145)

## 2019-02-19 LAB — SURGICAL PCR SCREEN
MRSA, PCR: NEGATIVE
Staphylococcus aureus: POSITIVE — AB

## 2019-02-19 SURGERY — LYSIS, ADHESIONS, LAPAROSCOPIC
Anesthesia: General

## 2019-02-19 MED ORDER — BUPIVACAINE LIPOSOME 1.3 % IJ SUSP
20.0000 mL | Freq: Once | INTRAMUSCULAR | Status: AC
  Administered 2019-02-19: 20 mL
  Filled 2019-02-19: qty 20

## 2019-02-19 MED ORDER — SODIUM CHLORIDE 0.9 % IV SOLN
INTRAVENOUS | Status: AC
  Filled 2019-02-19: qty 20

## 2019-02-19 MED ORDER — ONDANSETRON HCL 4 MG/2ML IJ SOLN: 4.0000 mg | Freq: Four times a day (QID) | INTRAMUSCULAR | Status: DC | PRN

## 2019-02-19 MED ORDER — METHOCARBAMOL 1000 MG/10ML IJ SOLN
1000.0000 mg | Freq: Four times a day (QID) | INTRAVENOUS | Status: DC | PRN
  Filled 2019-02-19: qty 10

## 2019-02-19 MED ORDER — ROCURONIUM BROMIDE 10 MG/ML (PF) SYRINGE
PREFILLED_SYRINGE | INTRAVENOUS | Status: AC
  Filled 2019-02-19: qty 10

## 2019-02-19 MED ORDER — FENTANYL CITRATE (PF) 250 MCG/5ML IJ SOLN
INTRAMUSCULAR | Status: AC
  Filled 2019-02-19: qty 5

## 2019-02-19 MED ORDER — SUGAMMADEX SODIUM 200 MG/2ML IV SOLN
INTRAVENOUS | Status: DC | PRN
  Administered 2019-02-19: 120 mg via INTRAVENOUS

## 2019-02-19 MED ORDER — HYDROMORPHONE HCL 2 MG/ML IJ SOLN
INTRAMUSCULAR | Status: AC
  Filled 2019-02-19: qty 1

## 2019-02-19 MED ORDER — ONDANSETRON HCL 4 MG/2ML IJ SOLN
INTRAMUSCULAR | Status: AC
  Filled 2019-02-19: qty 2

## 2019-02-19 MED ORDER — LIDOCAINE 2% (20 MG/ML) 5 ML SYRINGE
INTRAMUSCULAR | Status: DC | PRN
  Administered 2019-02-19: 60 mg via INTRAVENOUS

## 2019-02-19 MED ORDER — DEXAMETHASONE SODIUM PHOSPHATE 10 MG/ML IJ SOLN
INTRAMUSCULAR | Status: AC
  Filled 2019-02-19: qty 1

## 2019-02-19 MED ORDER — SUCCINYLCHOLINE CHLORIDE 200 MG/10ML IV SOSY
PREFILLED_SYRINGE | INTRAVENOUS | Status: DC | PRN
  Administered 2019-02-19: 60 mg via INTRAVENOUS

## 2019-02-19 MED ORDER — HYDROMORPHONE HCL 1 MG/ML IJ SOLN
INTRAMUSCULAR | Status: DC | PRN
  Administered 2019-02-19 (×2): 0.5 mg via INTRAVENOUS

## 2019-02-19 MED ORDER — BUPIVACAINE-EPINEPHRINE (PF) 0.25% -1:200000 IJ SOLN
INTRAMUSCULAR | Status: AC
  Filled 2019-02-19: qty 60

## 2019-02-19 MED ORDER — CHLORHEXIDINE GLUCONATE CLOTH 2 % EX PADS
6.0000 | MEDICATED_PAD | Freq: Once | CUTANEOUS | Status: AC
  Administered 2019-02-19: 09:00:00 6 via TOPICAL

## 2019-02-19 MED ORDER — MAGIC MOUTHWASH
15.0000 mL | Freq: Four times a day (QID) | ORAL | Status: DC | PRN
  Filled 2019-02-19: qty 15

## 2019-02-19 MED ORDER — FENTANYL CITRATE (PF) 100 MCG/2ML IJ SOLN
INTRAMUSCULAR | Status: DC | PRN
  Administered 2019-02-19 (×2): 100 ug via INTRAVENOUS
  Administered 2019-02-19: 50 ug via INTRAVENOUS

## 2019-02-19 MED ORDER — LIDOCAINE 2% (20 MG/ML) 5 ML SYRINGE
INTRAMUSCULAR | Status: AC
  Filled 2019-02-19: qty 5

## 2019-02-19 MED ORDER — METRONIDAZOLE IN NACL 5-0.79 MG/ML-% IV SOLN
INTRAVENOUS | Status: DC | PRN
  Administered 2019-02-19: 500 mg via INTRAVENOUS

## 2019-02-19 MED ORDER — EPHEDRINE SULFATE-NACL 50-0.9 MG/10ML-% IV SOSY
PREFILLED_SYRINGE | INTRAVENOUS | Status: DC | PRN
  Administered 2019-02-19 (×4): 5 mg via INTRAVENOUS

## 2019-02-19 MED ORDER — ONDANSETRON HCL 4 MG/2ML IJ SOLN
INTRAMUSCULAR | Status: DC | PRN
  Administered 2019-02-19: 4 mg via INTRAVENOUS

## 2019-02-19 MED ORDER — METRONIDAZOLE IN NACL 5-0.79 MG/ML-% IV SOLN
INTRAVENOUS | Status: AC
  Filled 2019-02-19: qty 100

## 2019-02-19 MED ORDER — BUPIVACAINE-EPINEPHRINE 0.25% -1:200000 IJ SOLN
INTRAMUSCULAR | Status: DC | PRN
  Administered 2019-02-19: 60 mL

## 2019-02-19 MED ORDER — SUCCINYLCHOLINE CHLORIDE 200 MG/10ML IV SOSY
PREFILLED_SYRINGE | INTRAVENOUS | Status: AC
  Filled 2019-02-19: qty 10

## 2019-02-19 MED ORDER — ROCURONIUM BROMIDE 10 MG/ML (PF) SYRINGE
PREFILLED_SYRINGE | INTRAVENOUS | Status: DC | PRN
  Administered 2019-02-19 (×2): 10 mg via INTRAVENOUS
  Administered 2019-02-19: 50 mg via INTRAVENOUS

## 2019-02-19 MED ORDER — FENTANYL CITRATE (PF) 100 MCG/2ML IJ SOLN: 25.0000 ug | INTRAMUSCULAR | Status: DC | PRN

## 2019-02-19 MED ORDER — KETAMINE HCL 10 MG/ML IJ SOLN
INTRAMUSCULAR | Status: DC | PRN
  Administered 2019-02-19: 10 mg via INTRAVENOUS

## 2019-02-19 MED ORDER — KETAMINE HCL 10 MG/ML IJ SOLN
INTRAMUSCULAR | Status: AC
  Filled 2019-02-19: qty 1

## 2019-02-19 MED ORDER — DEXAMETHASONE SODIUM PHOSPHATE 4 MG/ML IJ SOLN
INTRAMUSCULAR | Status: DC | PRN
  Administered 2019-02-19: 8 mg via INTRAVENOUS

## 2019-02-19 MED ORDER — LACTATED RINGERS IV SOLN
INTRAVENOUS | Status: DC
  Administered 2019-02-19: 13:00:00 via INTRAVENOUS

## 2019-02-19 MED ORDER — DEXTROSE 5 % IV SOLN
INTRAVENOUS | Status: DC | PRN
  Administered 2019-02-19: 18:00:00 2 g via INTRAVENOUS

## 2019-02-19 MED ORDER — BISACODYL 10 MG RE SUPP
10.0000 mg | Freq: Every day | RECTAL | Status: DC
  Administered 2019-02-19 – 2019-02-20 (×2): 10 mg via RECTAL
  Filled 2019-02-19 (×3): qty 1

## 2019-02-19 MED ORDER — ONDANSETRON HCL 4 MG/2ML IJ SOLN: 4.0000 mg | Freq: Once | INTRAMUSCULAR | Status: DC | PRN

## 2019-02-19 MED ORDER — PHENYLEPHRINE 40 MCG/ML (10ML) SYRINGE FOR IV PUSH (FOR BLOOD PRESSURE SUPPORT)
PREFILLED_SYRINGE | INTRAVENOUS | Status: DC | PRN
  Administered 2019-02-19 (×2): 80 ug via INTRAVENOUS

## 2019-02-19 MED ORDER — PROPOFOL 10 MG/ML IV BOLUS
INTRAVENOUS | Status: DC | PRN
  Administered 2019-02-19: 50 mg via INTRAVENOUS
  Administered 2019-02-19: 80 mg via INTRAVENOUS

## 2019-02-19 MED ORDER — MUPIROCIN 2 % EX OINT: 1.0000 "application " | TOPICAL_OINTMENT | Freq: Two times a day (BID) | CUTANEOUS | Status: DC

## 2019-02-19 MED ORDER — LIP MEDEX EX OINT
1.0000 "application " | TOPICAL_OINTMENT | Freq: Two times a day (BID) | CUTANEOUS | Status: DC
  Administered 2019-02-19 – 2019-02-23 (×8): 1 via TOPICAL
  Filled 2019-02-19 (×3): qty 7

## 2019-02-19 MED ORDER — ACETAMINOPHEN 10 MG/ML IV SOLN
1000.0000 mg | Freq: Once | INTRAVENOUS | Status: DC | PRN
  Administered 2019-02-19: 20:00:00 1000 mg via INTRAVENOUS

## 2019-02-19 MED ORDER — ALBUMIN HUMAN 5 % IV SOLN
12.5000 g | Freq: Four times a day (QID) | INTRAVENOUS | Status: AC | PRN
  Filled 2019-02-19: qty 250

## 2019-02-19 MED ORDER — SODIUM CHLORIDE 0.9 % IV SOLN
8.0000 mg | Freq: Four times a day (QID) | INTRAVENOUS | Status: DC | PRN
  Filled 2019-02-19: qty 4

## 2019-02-19 MED ORDER — ACETAMINOPHEN 10 MG/ML IV SOLN
INTRAVENOUS | Status: AC
  Filled 2019-02-19: qty 100

## 2019-02-19 MED ORDER — FENTANYL CITRATE (PF) 100 MCG/2ML IJ SOLN
25.0000 ug | INTRAMUSCULAR | Status: DC | PRN
  Filled 2019-02-19: qty 2

## 2019-02-19 MED ORDER — PROPOFOL 10 MG/ML IV BOLUS
INTRAVENOUS | Status: AC
  Filled 2019-02-19: qty 20

## 2019-02-19 SURGICAL SUPPLY — 77 items
APL PRP STRL LF DISP 70% ISPRP (MISCELLANEOUS) ×2
APPLIER CLIP 5 13 M/L LIGAMAX5 (MISCELLANEOUS)
APPLIER CLIP ROT 10 11.4 M/L (STAPLE)
APR CLP MED LRG 11.4X10 (STAPLE)
APR CLP MED LRG 5 ANG JAW (MISCELLANEOUS)
CABLE HIGH FREQUENCY MONO STRZ (ELECTRODE) ×3 IMPLANT
CELLS DAT CNTRL 66122 CELL SVR (MISCELLANEOUS) IMPLANT
CHLORAPREP W/TINT 26 (MISCELLANEOUS) ×5 IMPLANT
CLIP APPLIE 5 13 M/L LIGAMAX5 (MISCELLANEOUS) IMPLANT
CLIP APPLIE ROT 10 11.4 M/L (STAPLE) IMPLANT
COUNTER NEEDLE 20 DBL MAG RED (NEEDLE) ×1 IMPLANT
COVER MAYO STAND STRL (DRAPES) ×3 IMPLANT
COVER SURGICAL LIGHT HANDLE (MISCELLANEOUS) ×3 IMPLANT
COVER WAND RF STERILE (DRAPES) IMPLANT
DECANTER SPIKE VIAL GLASS SM (MISCELLANEOUS) ×5 IMPLANT
DRAIN CHANNEL 19F RND (DRAIN) IMPLANT
DRAPE LAPAROSCOPIC ABDOMINAL (DRAPES) ×3 IMPLANT
DRAPE SURG IRRIG POUCH 19X23 (DRAPES) ×1 IMPLANT
DRSG OPSITE POSTOP 4X10 (GAUZE/BANDAGES/DRESSINGS) IMPLANT
DRSG OPSITE POSTOP 4X6 (GAUZE/BANDAGES/DRESSINGS) IMPLANT
DRSG OPSITE POSTOP 4X8 (GAUZE/BANDAGES/DRESSINGS) IMPLANT
DRSG TEGADERM 2-3/8X2-3/4 SM (GAUZE/BANDAGES/DRESSINGS) ×8 IMPLANT
DRSG TEGADERM 4X4.75 (GAUZE/BANDAGES/DRESSINGS) IMPLANT
ELECT PENCIL ROCKER SW 15FT (MISCELLANEOUS) ×2 IMPLANT
ELECT REM PT RETURN 15FT ADLT (MISCELLANEOUS) ×3 IMPLANT
ENDOLOOP SUT PDS II  0 18 (SUTURE)
ENDOLOOP SUT PDS II 0 18 (SUTURE) IMPLANT
EVACUATOR SILICONE 100CC (DRAIN) IMPLANT
GAUZE SPONGE 2X2 8PLY STRL LF (GAUZE/BANDAGES/DRESSINGS) ×1 IMPLANT
GAUZE SPONGE 4X4 12PLY STRL (GAUZE/BANDAGES/DRESSINGS) ×1 IMPLANT
GLOVE ECLIPSE 8.0 STRL XLNG CF (GLOVE) ×4 IMPLANT
GLOVE INDICATOR 8.0 STRL GRN (GLOVE) ×4 IMPLANT
GOWN STRL REUS W/TWL XL LVL3 (GOWN DISPOSABLE) ×6 IMPLANT
HOLDER FOLEY CATH W/STRAP (MISCELLANEOUS) ×2 IMPLANT
IRRIG SUCT STRYKERFLOW 2 WTIP (MISCELLANEOUS) ×3
IRRIGATION SUCT STRKRFLW 2 WTP (MISCELLANEOUS) ×1 IMPLANT
KIT TURNOVER KIT A (KITS) ×2 IMPLANT
LEGGING LITHOTOMY PAIR STRL (DRAPES) IMPLANT
PACK COLON (CUSTOM PROCEDURE TRAY) ×3 IMPLANT
PAD POSITIONING PINK XL (MISCELLANEOUS) ×3 IMPLANT
PORT LAP GEL ALEXIS MED 5-9CM (MISCELLANEOUS) IMPLANT
PROTECTOR NERVE ULNAR (MISCELLANEOUS) ×2 IMPLANT
RETRACTOR WND ALEXIS 18 MED (MISCELLANEOUS) IMPLANT
RTRCTR WOUND ALEXIS 18CM MED (MISCELLANEOUS)
SCISSORS LAP 5X35 DISP (ENDOMECHANICALS) ×3 IMPLANT
SEALER TISSUE G2 STRG ARTC 35C (ENDOMECHANICALS) IMPLANT
SET TUBE SMOKE EVAC HIGH FLOW (TUBING) ×3 IMPLANT
SLEEVE ADV FIXATION 5X100MM (TROCAR) IMPLANT
SLEEVE XCEL OPT CAN 5 100 (ENDOMECHANICALS) ×6 IMPLANT
SPONGE GAUZE 2X2 STER 10/PKG (GAUZE/BANDAGES/DRESSINGS) ×2
SPONGE LAP 18X18 RF (DISPOSABLE) IMPLANT
STAPLER VISISTAT 35W (STAPLE) IMPLANT
SURGILUBE 2OZ TUBE FLIPTOP (MISCELLANEOUS) IMPLANT
SUT MNCRL AB 4-0 PS2 18 (SUTURE) ×5 IMPLANT
SUT PDS AB 1 CTX 36 (SUTURE) ×6 IMPLANT
SUT PDS AB 1 TP1 96 (SUTURE) IMPLANT
SUT PROLENE 0 CT 2 (SUTURE) IMPLANT
SUT PROLENE 2 0 SH DA (SUTURE) IMPLANT
SUT SILK 2 0 (SUTURE)
SUT SILK 2 0 SH CR/8 (SUTURE) ×3 IMPLANT
SUT SILK 2-0 18XBRD TIE 12 (SUTURE) ×1 IMPLANT
SUT SILK 3 0 (SUTURE)
SUT SILK 3 0 SH CR/8 (SUTURE) ×3 IMPLANT
SUT SILK 3-0 18XBRD TIE 12 (SUTURE) ×1 IMPLANT
SUT VICRYL 0 UR6 27IN ABS (SUTURE) ×1 IMPLANT
SYS LAPSCP GELPORT 120MM (MISCELLANEOUS)
SYSTEM LAPSCP GELPORT 120MM (MISCELLANEOUS) IMPLANT
TAPE UMBILICAL COTTON 1/8X30 (MISCELLANEOUS) ×1 IMPLANT
TOWEL OR 17X26 10 PK STRL BLUE (TOWEL DISPOSABLE) ×2 IMPLANT
TOWEL OR NON WOVEN STRL DISP B (DISPOSABLE) ×3 IMPLANT
TRAY FOLEY MTR SLVR 14FR STAT (SET/KITS/TRAYS/PACK) ×2 IMPLANT
TRAY FOLEY MTR SLVR 16FR STAT (SET/KITS/TRAYS/PACK) IMPLANT
TROCAR ADV FIXATION 5X100MM (TROCAR) IMPLANT
TROCAR BLADELESS OPT 5 100 (ENDOMECHANICALS) ×3 IMPLANT
TROCAR XCEL NON-BLD 11X100MML (ENDOMECHANICALS) IMPLANT
TUBING CONNECTING 10 (TUBING) IMPLANT
TUBING CONNECTING 10' (TUBING)

## 2019-02-19 NOTE — Discharge Instructions (Signed)
EATING AFTER A SMALL BOWEL OBSTRUCTION   EAT Gradually transition to a high fiber diet with a fiber supplement over the next few days after discharge  WALK Walk an hour a day.  Control your pain to do that.    CONTROL PAIN Control pain so that you can walk, sleep, tolerate sneezing/coughing, go up/down stairs.  HAVE A BOWEL MOVEMENT DAILY Keep your bowels regular to avoid problems.  OK to try a laxative to override constipation.  OK to use an antidairrheal to slow down diarrhea.  Call if not better after 2 tries  CALL IF YOU HAVE PROBLEMS/CONCERNS Call if you are still struggling despite following these instructions. Call if you have concerns not answered by these instructions     After your BOWEL OBSTRUCTION, expect some issues over the next few weeks.    To help you through this temporary phase, we start you out on a pureed (blenderized) diet.  Your first meal in the hospital was thin liquids.  You should have been given a pureed diet by the time you left the hospital.  We ask patients to stay on a pureed diet for the first few days to avoid anything getting "stuck."  Don't be alarmed if your ability to swallow doesn't progress according to this plan.  Everyone is different and some diets can advance more or less quickly.     Some BASIC RULES to follow are:  Maintain an upright position whenever eating or drinking.  Take small bites - just a teaspoon size bite at a time.  Eat slowly.  It may also help to eat only one food at a time.  Consider nibbling through smaller, more frequent meals & avoid the urge to eat BIG meals  Do not push through feelings of fullness, nausea, or bloatedness  Do not mix solid foods and liquids in the same mouthful  Try not to "wash foods down" with large gulps of liquids.  Avoid carbonated (bubbly/fizzy) drinks.    Avoid foods that make you feel gassy or bloated.  Start with bland foods first.  Wait on trying greasy, fried, or spicy meals  until you are tolerating more bland solids well.  Expect to be more gassy/flatulent/bloated initially.  Walking will help your body manage it better.  Consider using medications for bloating that contain simethicone such as  Maalox or Gas-X   Eat in a relaxed atmosphere & minimize distractions.  Avoid talking while eating.    Do not use straws.  Following each meal, sit in an upright position (90 degree angle) for 60 to 90 minutes.  Going for a short walk can help as well  If food does stick, don't panic.  Try to relax and let the food pass on its own.  Sipping WARM LIQUID such as strong hot black tea can also help slide it down.   Be gradual in changes & use common sense:  -If you easily tolerating a certain "level" of foods, advance to the next level gradually -If you are having trouble swallowing a particular food, then avoid it.   -If food is sticking when you advance your diet, go back to thinner previous diet (the lower LEVEL) for 1-2 days.  LEVEL 1 = PUREED DIET  Do for the first [place] in this group are pureed or blenderized to a smooth, mashed potato-like consistency.  -If necessary, the pureed foods can keep their shape with the addition of a thickening agent.   -  Meat should be pureed to a smooth, pasty consistency.  Hot broth or gravy may be added to the pureed meat, approximately 1 oz. of liquid per 3 oz. serving of meat. -CAUTION:  If any foods do not puree into a smooth consistency, swallowing will be more difficult.  (For example, nuts or seeds sometimes do not blend well.)  Hot Foods Cold Foods  Pureed scrambled eggs and cheese Pureed cottage cheese  Baby cereals Thickened juices and nectars  Thinned cooked cereals (no lumps) Thickened milk or eggnog  Pureed [place] toast or pancakes Ensure  Mashed potatoes Ice cream  Pureed parsley, au gratin, scalloped potatoes, candied sweet potatoes Fruit or [place] ice, sherbet  Pureed  buttered or alfredo noodles Plain yogurt  Pureed vegetables (no corn or peas) Instant breakfast  Pureed soups and creamed soups Smooth pudding, mousse, custard  Pureed scalloped apples Whipped gelatin  Gravies Sugar, syrup, honey, jelly  Sauces, cheese, tomato, barbecue, white, creamed Cream  Any baby food Creamer  Alcohol in moderation (not beer or champagne) Margarine  Coffee or tea Mayonnaise   Ketchup, mustard   Apple sauce   SAMPLE MENU:  PUREED DIET Breakfast Lunch Dinner   Orange juice, 1/2 cup  Cream of wheat, 1/2 cup  Pineapple juice, 1/2 cup  Pureed [place], barley soup, 3/4 cup  Pureed Hawaiian chicken, 3 oz   Scrambled eggs, mashed or blended with cheese, 1/2 cup  Tea or coffee, 1 cup   Whole milk, 1 cup   Non-dairy creamer, 2 Tbsp.  Mashed potatoes, 1/2 cup  Pureed cooled broccoli, 1/2 cup  Apple sauce, 1/2 cup  Coffee or tea  Mashed potatoes, 1/2 cup  Pureed spinach, 1/2 cup  Frozen yogurt, 1/2 cup  Tea or coffee      LEVEL 2 = SOFT DIET  After your first few days, you can advance to a soft diet.   Keep on this diet until everything goes down easily.  Hot Foods Cold Foods  White fish Cottage cheese  Stuffed fish Junior baby fruit  Baby food meals Semi thickened juices  Minced soft cooked, scrambled, poached eggs nectars  Souffle & omelets Ripe mashed bananas  Cooked cereals Canned fruit, pineapple sauce, milk  potatoes Milkshake  Buttered or Alfredo noodles Custard  Cooked cooled vegetable Puddings, including tapioca  [place]bet Yogurt  Vegetable soup or alphabet soup Fruit ice, [place] ice  Gravies Whipped gelatin  Sugar, syrup, honey, jelly Junior baby desserts  Sauces:  Cheese, creamed, barbecue, tomato, white Cream  Coffee or tea Margarine   SAMPLE MENU:  LEVEL 2 Breakfast Lunch Dinner   Orange juice, 1/2 cup  Oatmeal, 1/2 cup  Scrambled eggs with cheese, 1/2 cup  Decaffeinated tea, 1 cup  Whole milk, 1 cup  Non-dairy  creamer, 2 Tbsp  Pineapple juice, 1/2 cup  Minced beef, 3 oz  Gravy, 2 Tbsp  Mashed potatoes, 1/2 cup  Minced fresh broccoli, 1/2 cup  Applesauce, 1/2 cup  Coffee, 1 cup  [place], barley soup, 3/4 cup  Minced Hawaiian chicken, 3 oz  Mashed potatoes, 1/2 cup  Cooked spinach, 1/2 cup  Frozen yogurt, 1/2 cup  Non-dairy creamer, 2 Tbsp      LEVEL 3 = CHOPPED DIET  -After all the foods in level 2 (soft diet) are passing through well you should advance up to more chopped foods.  -It is still important to cut these foods into small pieces and eat slowly.  [place] cheese  Chopped Swedish meatballs Yogurt  Meat salads (ground or flaked meat) Milk  Flaked fish (tuna) Milkshakes  Poached or scrambled eggs Soft, cold, dry cereal  Souffles and omelets Fruit juices or nectars  Cooked cereals Chopped canned fruit  Chopped [place] toast or pancakes Canned fruit cocktail  Noodles or pasta (no rice) Pudding, mousse, custard  Cooked vegetables (no frozen peas, corn, or mixed vegetables) Green salad  Canned small sweet peas Ice cream  Creamed soup or vegetable soup Fruit ice, [place] ice  Pureed vegetable soup or alphabet soup Non-dairy creamer  Ground scalloped apples Margarine  Gravies Mayonnaise  Sauces:  Cheese, creamed, barbecue, tomato, white Ketchup  Coffee or tea Mustard   SAMPLE MENU:  LEVEL 3 Breakfast Lunch Dinner   Orange juice, 1/2 cup  Oatmeal, 1/2 cup  Scrambled eggs with cheese, 1/2 cup  Decaffeinated tea, 1 cup  Whole milk, 1 cup  Non-dairy creamer, 2 Tbsp  Ketchup, 1 Tbsp  Margarine, 1 tsp  Salt, 1/4 tsp  Sugar, 2 tsp  Pineapple juice, 1/2 cup  Ground beef, 3 oz  Gravy, 2 Tbsp  Mashed potatoes, 1/2 cup  Cooked spinach, 1/2 cup  Applesauce, 1/2 cup  Decaffeinated coffee  Whole milk  Non-dairy creamer, 2 Tbsp  Margarine, 1 tsp  Salt, 1/4 tsp  Pureed [place], barley soup, 3/4 cup  Barbecue chicken,  3 oz  Mashed potatoes, 1/2 cup  Ground fresh broccoli, 1/2 cup  Frozen yogurt, 1/2 cup  Decaffeinated tea, 1 cup  Non-dairy creamer, 2 Tbsp  Margarine, 1 tsp  Salt, 1/4 tsp  Sugar, 1 tsp    LEVEL 4:  REGULAR FOODS  -Foods in this group are soft, moist, regularly textured foods.   -This level includes meat and breads, which tend to be the hardest things to swallow.   -Eat very slowly, chew well and continue to avoid carbonated drinks. -most people are at this level in 2-4 weeks  Hot Foods Cold Foods  Baked fish or skinned Soft cheeses - cottage cheese  Souffles and omelets Cream cheese  Eggs Yogurt  Stuffed shells Milk  Spaghetti with meat sauce Milkshakes  Cooked cereal Cold dry cereals (no nuts, dried fruit, coconut)  [place] toast or pancakes Crackers  Buttered toast Fruit juices or nectars  Noodles or pasta (no rice) Canned fruit  Potatoes (all types) Ripe bananas  Soft, cooked vegetables (no corn, lima, or baked beans) Peeled, ripe, fresh fruit  Creamed soups or vegetable soup Cakes (no nuts, dried fruit, coconut)  Canned chicken noodle soup Plain doughnuts  Gravies Ice cream  Bacon dressing Pudding, mousse, custard  Sauces:  Cheese, creamed, barbecue, tomato, white Fruit ice, [place] ice, sherbet  Decaffeinated tea or coffee Whipped gelatin  Pork chops Regular gelatin   Canned fruited gelatin molds   Sugar, syrup, honey, jam, jelly   Cream   Non-dairy   Margarine   Oil   Mayonnaise   Ketchup   Mustard   TROUBLESHOOTING IRREGULAR BOWELS  1) Avoid extremes of bowel movements (no bad constipation/diarrhea)  2) Miralax 17gm mixed in 8oz. water or juice-daily. May use BID as needed.  3) Gas-x,Phazyme, etc. as needed for gas & bloating.  4) Soft,bland diet. No spicy,greasy,fried foods.  5) Prilosec over-the-counter as needed  6) May hold gluten/wheat products from diet to see if symptoms improve.  7) May try probiotics (Align, Activa, etc) to help calm the  bowels down  7) If symptoms become worse call back immediately.    If  you have any questions please call our office at [place]: [phone].   Small Bowel Obstruction A small bowel obstruction is a blockage in the small bowel. The small bowel, which is also called the small intestine, is a long, slender tube that connects the stomach to the colon. When a person eats and drinks, food and fluids go from the stomach to the small bowel. This is where most of the nutrients in the food and fluids are absorbed. A small bowel obstruction will prevent food and fluids from passing through the small bowel as they normally do during digestion. The small bowel can become partially or completely blocked. This can cause symptoms such as abdominal pain, vomiting, and bloating. If this condition is not treated, it can be dangerous because the small bowel could rupture. CAUSES Common causes of this condition include: 2. Scar tissue from previous surgery or radiation treatment. 3. Recent surgery. This may cause the movements of the bowel to slow down and cause food to block the intestine. 4. Hernias. 5. Inflammatory bowel disease (colitis). 6. Twisting of the bowel (volvulus). 7. Tumors. 8. A foreign body. 9. Slipping of a part of the bowel into another part (intussusception). SYMPTOMS Symptoms of this condition include: 2. Abdominal pain. This may be dull cramps or sharp pain. It may occur in one area, or it may be present in the entire abdomen. Pain can range from mild to severe, depending on the degree of obstruction. 3. Nausea and vomiting. Vomit may be greenish or a yellow bile color. 4. Abdominal bloating. 5. Constipation. 6. Lack of passing gas. 7. Frequent belching. 8. Diarrhea. This may occur if the obstruction is partial and runny stool is able to leak around the obstruction. DIAGNOSIS This condition may be diagnosed based on a physical exam, medical history, and X-rays of the  abdomen. You may also have other tests, such as a CT scan of the abdomen and pelvis. TREATMENT Treatment for this condition depends on the cause and severity of the problem. Treatment options may include: 2. Bed rest along with fluids and pain medicines that are given through an IV tube inserted into one of your veins. Sometimes, this is all that is needed for the obstruction to improve. 3. Following a simple diet. In some cases, a clear liquid diet may be required for several days. This allows the bowel to rest. 4. Placement of a small tube (nasogastric tube) into the stomach. When the bowel is blocked, it usually swells up like a balloon that is filled with air and fluids. The air and fluids may be removed by suction through the nasogastric tube. This can help with pain, discomfort, and nausea. It can also help the obstruction to clear up faster. 5. Surgery. This may be required if other treatments do not work. Bowel obstruction from a hernia may require early surgery and can be an emergency procedure. Surgery may also be required for scar tissue that causes frequent or severe obstructions. HOME CARE INSTRUCTIONS  Get plenty of rest.  Follow instructions from your health care provider about eating restrictions. You may need to avoid solid foods and consume only clear liquids until your condition improves.  Take over-the-counter and prescription medicines only as told by your health care provider.  Keep all follow-up visits as told by your health care provider. This is important. SEEK MEDICAL CARE IF: 2. You have a fever. 3. You have chills. SEEK IMMEDIATE MEDICAL CARE IF: 2. You have increased pain or cramping.  3. You vomit blood. 4. You have uncontrolled vomiting or nausea. 5. You cannot drink fluids because of vomiting or pain. 6. You develop confusion. 7. You begin feeling very dry or thirsty (dehydrated). 8. You have severe bloating. 9. You feel extremely weak or you faint.   This  information is not intended to replace advice given to you by your health care provider. Make sure you discuss any questions you have with your health care provider.

## 2019-02-19 NOTE — Progress Notes (Signed)
[facility]/Trauma Progress Note      Assessment/Plan Hx of abdominal hysterectomy (~1975_ with subsequent surgeries 2/2 complications  Hx of lap chole and LOA 08/2016, Dr. Georganna Skeans  SBO - protocol in place -Feels persistent obstruction.  Abdomen softer but still with some discomfort.  I think she is failed nonoperative management, especially in the setting of prior bowel obstruction a couple months ago.  I think she would benefit from operative exploration.  Because she is better decompressed, will start out laparoscopically in hopes we can do lyse adhesions.  With the transition point she could have a stricture there and may require small bowel resection.  We will see.  Patient agrees with proceeding with surgery.  We will plan later today  The anatomy & physiology of the digestive tract was discussed.  The pathophysiology of intestinal obstruction was discussed.  Natural history risks without surgery was discussed.   I feel the patient has failed non-operative therapies.  The risks of no intervention will lead to serious problems such as necrosis, perforation, dehydration, etc. that outweigh the operative risks; therefore, I recommended abdominal exploration to diagnose & treat the source of the problem.  Minimally Invasive & open techniques were discussed.   I expressed a good likelihood that surgery will treat the problem.  Risks such as bleeding, infection, abscess, leak, reoperation, bowel resection, possible ostomy, hernia, injury to other organs, need for repair of tissues / organs, need for further treatment, heart attack, death, and other risks were discussed.   I noted a good likelihood this will help address the problem.  Goals of post-operative recovery were discussed as well.  We will work to minimize complications. Questions were answered.  The patient expresses understanding & wishes to proceed with surgery.   FEN: NPO, NGT, IVF VTE: SCD's, lovenox ID:  Rocephin qhs Foley: none Follow up: TBD    LOS: 2 days    Subjective: CC: abdominal pain   Patient still feeling distended.  Still has some pain in the left side although less.  No flatus.  No bowel movement.  Objective: Vital signs in last 24 hours: Temp:  [98.2 F (36.8 C)-100.4 F (38 C)] 98.2 F (36.8 C) (08/07 0407) Pulse Rate:  [92-102] 92 (08/07 0407) Resp:  [16-20] 16 (08/07 0407) BP: (136-170)/(84-89) 148/84 (08/07 0407) SpO2:  [94 %-95 %] 94 % (08/07 0407) Weight:  [60.7 kg] 60.7 kg (08/07 0407) Last BM Date: 02/17/19  Intake/Output from previous day: 08/06 0701 - 08/07 0700 In: 1377.8 [P.O.:10; I.V.:1303.2; IV Piggyback:64.7] Out: 2100 [Urine:500; Emesis/NG output:1600] Intake/Output this shift: No intake/output data recorded.  PE: Gen:  Alert, NAD, pleasant, cooperative HEENT: NGT in place, clamped.  Moderately thick bilious output. Pulm:  Rate and effort normal Abd: Soft, moderate distention but softer today.  Still with discomfort in the left side of the abdomen.  Less but not gone away. Skin: no rashes noted, warm and dry   Anti-infectives: Anti-infectives (From admission, onward)   Start     Dose/Rate Route Frequency Ordered Stop   02/17/19 2315  cefTRIAXone (ROCEPHIN) 1 g in sodium chloride 0.9 % 100 mL IVPB     1 g 200 mL/hr over 30 Minutes Intravenous Daily at bedtime 02/17/19 2311        Lab Results:  Recent Labs    02/18/19 0403 02/19/19 0334  WBC 6.8 6.8  HGB 12.2 13.3  HCT 36.9 40.9  PLT 286 290   BMET Recent Labs    02/18/19  0403 02/19/19 0334  NA 132* 139  K 4.1 4.1  CL 100 104  CO2 22 25  GLUCOSE 119* 128*  BUN 20 21  CREATININE 0.90 0.93  CALCIUM 8.7* 9.1   PT/INR Recent Labs    02/17/19 2228  LABPROT 11.8  INR 0.9   CMP     Component Value Date/Time   NA 139 02/19/2019 0334   NA 132 (A) 09/18/2016   K 4.1 02/19/2019 0334   CL 104 02/19/2019 0334   CO2 25 02/19/2019 0334   GLUCOSE 128 (H)  02/19/2019 0334   BUN 21 02/19/2019 0334   BUN 17 09/18/2016   CREATININE 0.93 02/19/2019 0334   CALCIUM 9.1 02/19/2019 0334   PROT 6.2 (L) 02/18/2019 0403   ALBUMIN 3.5 02/18/2019 0403   AST 21 02/18/2019 0403   ALT 16 02/18/2019 0403   ALKPHOS 60 02/18/2019 0403   BILITOT 0.9 02/18/2019 0403   GFRNONAA >60 02/19/2019 0334   GFRAA >60 02/19/2019 0334   Lipase     Component Value Date/Time   LIPASE 33 02/17/2019 1721    Studies/Results: Ct Abdomen Pelvis W Contrast  Result Date: 02/17/2019 CLINICAL DATA:  Abdominal pain with nausea EXAM: CT ABDOMEN AND PELVIS WITH CONTRAST TECHNIQUE: Multidetector CT imaging of the abdomen and pelvis was performed using the standard protocol following bolus administration of intravenous contrast. CONTRAST:  142mL OMNIPAQUE IOHEXOL 300 MG/ML  SOLN COMPARISON:  01/08/2019 radiograph, CT 12/30/2018 FINDINGS: Lower chest: Lung bases demonstrate no consolidation or pleural effusion. The heart size is within normal limits. Large hiatal hernia. Hepatobiliary: Status post cholecystectomy. No focal hepatic abnormality. Stable mild intra and moderate extrahepatic biliary dilatation. Pancreas: Unremarkable. No pancreatic ductal dilatation or surrounding inflammatory changes. Spleen: Normal in size without focal abnormality. Adrenals/Urinary Tract: Adrenal glands are unremarkable. Kidneys are normal, without renal calculi, focal lesion, or hydronephrosis. Bladder is unremarkable. Stomach/Bowel: Mild fluid-filled stomach. Multiple dilated loops of fluid-filled small bowel, measuring up to 7 cm in the left lower quadrant with abrupt transition to decompressed distal small bowel and colon consistent with mechanical small bowel obstruction, high-grade. Transition point within the mid pelvis, slightly to the right of midline, series 2, image number 50. No bowel wall thickening. History of appendectomy. Descending and sigmoid colon diverticular disease without acute inflammatory  change. Vascular/Lymphatic: Moderate aortic atherosclerosis. No aneurysm. No significantly enlarged lymph nodes. Reproductive: Status post hysterectomy. No adnexal masses. Other: Negative for free air or free fluid Musculoskeletal: Scoliosis of the spine. Chronic compression deformity T12. Mild chronic superior endplate deformity at L2. Grade 1 anterolisthesis L3 on L4 and L4 on L5. Diffuse degenerative changes. IMPRESSION: 1. Multiple markedly dilated fluid-filled loops of small bowel with abrupt transition to decompressed mid to distal small, findings are consistent with high-grade small bowel obstruction. Transition point visualized within the mid pelvis, slightly to the right of midline. Negative for free air 2. Status post cholecystectomy. Stable intra and extrahepatic biliary dilatation 3. Descending and sigmoid colon diverticular disease without acute inflammatory change Electronically Signed   By: Donavan Foil M.D.   On: 02/17/2019 19:26   Dg Abd Portable 1v-small Bowel Obstruction Protocol-initial, 8 Hr Delay  Result Date: 02/18/2019 CLINICAL DATA:  Small bowel obstruction EXAM: PORTABLE ABDOMEN - 1 VIEW COMPARISON:  02/18/2019 FINDINGS: NG tube tip is in the distal stomach. Dilated small bowel loops within the abdomen and pelvis compatible with small bowel obstruction. No change. Prior cholecystectomy. No organomegaly or free air. IMPRESSION: Stable high-grade small bowel obstruction pattern.  Electronically Signed   By: Rolm Baptise M.D.   On: 02/18/2019 19:23   Dg Abd Portable 1v  Result Date: 02/18/2019 CLINICAL DATA:  Nasogastric tube placement EXAM: PORTABLE ABDOMEN - 1 VIEW COMPARISON:  Same-day radiograph FINDINGS: Nasogastric tube is in place with distal tip and side hole projecting below the diaphragm within the stomach. Hiatal hernia is noted. Multiple markedly dilated loops of small bowel ago at throughout the abdomen compatible with a small-bowel obstruction. No Braddock Servellon free intraperitoneal  air. IMPRESSION: Nasogastric tube in satisfactory positioning. Electronically Signed   By: Davina Poke M.D.   On: 02/18/2019 11:07   Dg Abd Portable 1v  Result Date: 02/18/2019 CLINICAL DATA:  NG tube placement EXAM: PORTABLE ABDOMEN - 1 VIEW COMPARISON:  CT 02/17/2019 FINDINGS: NG tube is in place with the tip in the proximal stomach. The side port is near the GE junction. Dilated small bowel loops again noted compatible with small bowel obstruction. IMPRESSION: NG tube tip in the proximal stomach. Electronically Signed   By: Rolm Baptise M.D.   On: 02/18/2019 02:59   Dg Abd Portable 1 View  Result Date: 02/17/2019 CLINICAL DATA:  Replacement of NG tube EXAM: PORTABLE ABDOMEN - 1 VIEW COMPARISON:  02/17/2019, CT 02/17/2019 FINDINGS: Esophageal tube side port at the level of hiatal hernia in the distal mediastinum with tip at the level of the diaphragmatic hiatus. Dilated small bowel consistent with obstruction. Contrast within the renal collecting systems IMPRESSION: Esophageal tube tip is partially coiled over the distal mediastinum within the patient's hiatal hernia, the tip is at the level of diaphragmatic hiatus. Further advancement and repositioning is recommended. Electronically Signed   By: Donavan Foil M.D.   On: 02/17/2019 21:45   Dg Abd Portable 1 View  Result Date: 02/17/2019 CLINICAL DATA:  Check NG placement EXAM: PORTABLE ABDOMEN - 1 VIEW COMPARISON:  Film from earlier in the same day. FINDINGS: Nasogastric catheter is again identified now coiled within the distal esophagus with the tip at the level of the aortic knob. This should be removed completely and readvanced. Cardiac shadow is within normal limits. Contrast from recent CT is noted. IMPRESSION: Nasogastric catheter coiled within the esophagus as described. This should be removed and readvanced. Electronically Signed   By: Inez Catalina M.D.   On: 02/17/2019 21:38   Dg Abd Portable 1 View  Result Date: 02/17/2019 CLINICAL DATA:   NG tube placement. Small bowel obstruction. EXAM: PORTABLE ABDOMEN - 1 VIEW COMPARISON:  CT scan dated 02/17/2019 FINDINGS: The NG tube tip is at the level of the diaphragm. The patient has a known large hiatal hernia with narrowing of the gastric lumen at the level of the diaphragmatic hiatus as demonstrated on the prior CT scan. Persistent dilated small bowel loops. Contrast seen in the kidneys and bladder from recent CT scan. Thoracolumbar scoliosis. IMPRESSION: NG tube tip at the level of the diaphragm likely in the hiatal hernia above the diaphragmatic hiatus. Persistent small bowel obstruction. Critical Value/emergent results were called by telephone at the time of interpretation on 02/17/2019 at 9:02 pm to Dr. Gilford Raid, who verbally acknowledged these results. Electronically Signed   By: Lorriane Shire M.D.   On: 02/17/2019 21:04      Morton Peters, MD, FACS, MASCRS Gastrointestinal and Minimally Invasive Surgery    [address] [place], [place] [address] [phone] Main / Paging [phone] Fax   02/19/2019, 7:43 AM  Pager: [phone]  Mon-Wed, Friday 7:00am-4:30pm Thurs 7am-11:30am  Consults: [phone]

## 2019-02-19 NOTE — Progress Notes (Signed)
Right upper extremity venous duplex has been completed. Preliminary results can be found in CV Proc through chart review.   02/19/19 10:02 AM Carlos Levering RVT

## 2019-02-19 NOTE — Transfer of Care (Signed)
Immediate Anesthesia Transfer of Care Note  Patient: Ashley Howard  Procedure(s) Performed: Procedure(s): LAPAROSCOPIC LYSIS OF ADHESIONS (N/A)  Patient Location: PACU  Anesthesia Type:General  Level of Consciousness:  sedated, patient cooperative and responds to stimulation  Airway & Oxygen Therapy:Patient Spontanous Breathing and Patient connected to face mask oxgen  Post-op Assessment:  Report given to PACU RN and Post -op Vital signs reviewed and stable  Post vital signs:  Reviewed and stable  Last Vitals:  Vitals:   02/19/19 1218 02/19/19 1236  BP: 135/80 136/81  Pulse: 78 80  Resp: 16 20  Temp: 36.8 C 37.1 C  SpO2: 15% 87%    Complications: No apparent anesthesia complications

## 2019-02-19 NOTE — Op Note (Signed)
02/19/2019  PATIENT:  Ashley Howard  73 y.o. female  Patient Care Team: Lynnell Catalan, [place] as PCP - General (Family Medicine) Michael Boston, MD as Consulting Physician (General Surgery)  PRE-OPERATIVE DIAGNOSIS:  SMALL BOWEL OBSTRUCTION  POST-OPERATIVE DIAGNOSIS:  SMALL BOWEL OBSTRUCTION  PROCEDURE:  LAPAROSCOPIC LYSIS OF ADHESIONS  SURGEON:  Adin Hector, MD  ASSISTANT: Nurse   ANESTHESIA:     General  Nerve block provided with liposomal bupivacaine (Experel) mixed with 0.25% bupivacaine as a Bilateral TAP block x 2mL each side at the level of the transverse abdominis & preperitoneal spaces along the flank at the anterior axillary line, from subcostal ridge to iliac crest under laparoscopic guidance   EBL:  No intake/output data recorded.  Per anesthesia record  Delay start of Pharmacological VTE agent (>24hrs) due to surgical blood loss or risk of bleeding:  no  DRAINS: none   SPECIMEN:  No Specimen  DISPOSITION OF SPECIMEN:  N/A  COUNTS:  YES  PLAN OF CARE: Admit to inpatient   PATIENT DISPOSITION:  PACU - hemodynamically stable.  INDICATION: Pleasant patient with numerous prior abdominal surgeries and bowel obstructions.  Had just recovered from June when came back and noted she has had some persistent symptoms.  X-ray concerning for high-grade small bowel obstruction.  Placed on small bowel protocol but with persistent abdominal pain.  Failure to improve.  I recommended laparoscopic possible open abdominal exploration with lyse lesions.  Possible need for bowel resection.  The anatomy & physiology of the digestive tract was discussed.  The pathophysiology of intestinal obstruction was discussed.  Natural history risks without surgery was discussed.   I feel the patient has failed non-operative therapies.  The risks of no intervention will lead to serious problems such as necrosis, perforation, dehydration, etc. that outweigh the operative risks; therefore, I  recommended abdominal exploration to diagnose & treat the source of the problem.  Minimally Invasive & open techniques were discussed.   I expressed a good likelihood that surgery will treat the problem.  Risks such as bleeding, infection, abscess, leak, reoperation, bowel resection, possible ostomy, hernia, injury to other organs, need for repair of tissues / organs, need for further treatment, heart attack, death, and other risks were discussed.   I noted a good likelihood this will help address the problem.  Goals of post-operative recovery were discussed as well.  We will work to minimize complications. Questions were answered.  The patient expresses understanding & wishes to proceed with surgery.   OR FINDINGS: Chronically dilated small bowel loops with transition zone in very distal ileum.  Distal foot of ileum decompressed.  Dense adhesions of small bowel with particularly ileum to bladder wall and pelvis.  Internal twisting and hairpin loops with somewhat chronically encased closed-loop partial obstructions noted.   DESCRIPTION:   Informed consent was confirmed. The patient underwent general anaesthesia without difficulty. The patient was positioned appropriately. VTE prevention in place. The patient's abdomen was clipped, prepped, & draped in a sterile fashion. Surgical timeout confirmed our plan.  The patient was positioned in reverse Trendelenburg. Abdominal entry was gained using optical entry technique in the left upper abdomen. Entry was clean. I induced carbon dioxide insufflation. Camera inspection revealed no injury. Extra ports were carefully placed under direct laparoscopic visualization.   I could see adhesions of the small bowel to the pelvis.  There were no adhesions in the upper abdomen.  I found the ileocecal region.  Found the terminal ileum.  It was  very decompressed.  Try to run it more proximally but it went down into the pelvis.  Stuck.  I proceeded to lyse adhesions of small  bowel loops out of the pelvis.  There is extremely dense loop to the dome of the bladder going towards the right lower quadrant.  I carefully freed that off.  Freed off more layers of small bowel off the right lower retroperitoneum and pelvis.  Freed off the adnexa.  Eventually could get the small bowel out of the pelvis and off the rectosigmoid colon.  I did lyse lesions to open up hairpin loops as well.  Was apparent there is a transition zone about a foot proximal to the ileocecal valve.  Had some thickened band.  Most adhesions were like chewing gum.  Very stretchy and persistent but with some tighter bands.  Entually freed up & untwisted the ileal bowel loops and opened everything up.  I excised some chronic chewing gum scar as well as some thicker bands.  I ran the small bowel from the ileocecal valve proximally.  After about 5 feet the more proximal jejunum was chronically dilated but with no adhesions.  I freed off a few more adhesions down the pelvis.  Ran the small bowel numerous times.  The distal ileal transition point still had somewhat of a dense band on the serosa that I carefully freed off and opened up.  Freed the serosa off its adhesions to the mesentery and straightened it out.  Felt nor saw any no mass or tumor.  Ran the small bowel again from the distal ileocecal junction proximally towards ligament of Treitz and allow the bowel to lay well.  I freed some adhesions of the colon off the liver to allow the transverse colon to fall down towards the pelvis.   Did laparoscopic irrigation.  Clear return.  No evidence of serosal injury or other abnormalities.  Bowel viable.  No ischemia or necrosis.  No twisting or torsion.  I left 500 mL irrigation in the abdomen.  We evacuated CO2 & desufflated the abdomen.  Ports were removed. The skin was closed with Monocryl at the port sites.  Patient is being extubated to go to the recovery room.  I discussed operative findings, updated the patient's status,  discussed probable steps to recovery, and gave postoperative recommendations to the patient's son, Priyal Musquiz.  Recommendations were made.  Questions were answered.  He expressed understanding & appreciation.  Adin Hector, M.D., F.A.C.S. Gastrointestinal and Minimally Invasive Surgery [facility] [address], [place], [place] [address] [phone] Main / Paging  02/19/2019 7:09 PM

## 2019-02-19 NOTE — Progress Notes (Signed)
[facility]  PROGRESS NOTE  Ashley Howard BVQ:[id number] DOB: 1945/09/09 DOA: 02/17/2019 PCP: Lynnell Catalan, FNP   Brief HPI:   73 year old female with a history of chronic back pain, GERD, history of SBO, hysterectomy, appendectomy came to the hospital with abdominal pain nausea after breakfast.  Also had 3 episodes of vomiting.  CT abdomen pelvis showed multiple markedly dilated fluid-filled loops of small bowel with abrupt transition to decompressed mid to distal small findings consistent with high-grade small bowel obstruction. NG tube was inserted  Subjective   Patient seen and examined, still continues to have SBO.  Plan for laparoscopic lysis of adhesions as per surgery today.  She complains of swelling in right arm, developed after she had IV placed 2 days ago.   Assessment/Plan:    1. Small bowel obstruction-patient has history of multiple surgeries in the past, likely cause of SBO.  NG tube to intermittent suction in place, morphine as needed, Zofran as needed for vomiting.  Appreciate surgical consultation.  Small bowel protocol has failed.  Plans for surgery today.  2. GERD-continue Protonix  3. Right arm swelling-venous duplex of right upper extremity obtained, is negative for DVT.  4. Hyponatremia-likely from GI losses, sodium 132 today.  Follow BMP in a.m.  5. UTI-patient has a normal UA, started on Rocephin 1 g IV daily.  Urine culture obtained.   CBC: Recent Labs  Lab 02/17/19 1721 02/18/19 0403 02/19/19 0334  WBC 15.3* 6.8 6.8  NEUTROABS 13.6*  --   --   HGB 14.8 12.2 13.3  HCT 43.8 36.9 40.9  MCV 98.0 97.4 100.0  PLT 308 286 800    Basic Metabolic Panel: Recent Labs  Lab 02/17/19 1721 02/18/19 0403 02/19/19 0334  NA 131* 132* 139  K 4.3 4.1 4.1  CL 96* 100 104  CO2 21* 22 25  GLUCOSE 133* 119* 128*  BUN 18 20 21   CREATININE 0.97 0.90 0.93  CALCIUM 10.2 8.7* 9.1     DVT prophylaxis: Lovenox  Code Status: Full code  Family  Communication: No family at bedside  Disposition Plan: likely home when medically ready for discharge   Scheduled medications:  . [MAR Hold] enoxaparin (LOVENOX) injection  40 mg Subcutaneous Q24H  . [MAR Hold] pantoprazole (PROTONIX) IV  40 mg Intravenous QHS    Consultants:  Surgery  Procedures:     Antibiotics:   Anti-infectives (From admission, onward)   Start     Dose/Rate Route Frequency Ordered Stop   02/17/19 2315  [MAR Hold]  cefTRIAXone (ROCEPHIN) 1 g in sodium chloride 0.9 % 100 mL IVPB     (MAR Hold since Fri 02/19/2019 at 1230.Hold Reason: Transfer to a Procedural area.)   1 g 200 mL/hr over 30 Minutes Intravenous Daily at bedtime 02/17/19 2311         Objective   Vitals:   02/18/19 2059 02/19/19 0407 02/19/19 1218 02/19/19 1236  BP: (!) 170/89 (!) 148/84 135/80 136/81  Pulse: (!) 102 92 78 80  Resp: 16 16 16 20   Temp: (!) 100.4 F (38 C) 98.2 F (36.8 C) 98.2 F (36.8 C) 98.7 F (37.1 C)  TempSrc: Oral Oral Oral Oral  SpO2: 95% 94% 97% 96%  Weight:  60.7 kg    Height:        Intake/Output Summary (Last 24 hours) at 02/19/2019 1423 Last data filed at 02/19/2019 1100 Gross per 24 hour  Intake 798.62 ml  Output 2100 ml  Net -1301.38 ml  Filed Weights   02/17/19 2213 02/18/19 0543 02/19/19 0407  Weight: 60.3 kg 60.9 kg 60.7 kg     Physical Examination:  General-appears in no acute distress Heart-S1-S2, regular, no murmur auscultated Lungs-clear to auscultation bilaterally, no wheezing or crackles auscultated Abdomen-soft, nontender, no organomegaly Extremities-no edema in the lower extremities Neuro-alert, oriented x3, no focal deficit noted    Data Reviewed: I have personally reviewed following labs and imaging studies   Recent Results (from the past 240 hour(s))  SARS Coronavirus 2 [facility] order, Performed in [facility] hospital lab) Nasopharyngeal Nasopharyngeal Swab     Status: None   Collection Time: 02/17/19  3:56 PM    Specimen: Nasopharyngeal Swab  Result Value Ref Range Status   SARS Coronavirus 2 NEGATIVE NEGATIVE Final    Comment: (NOTE) If result is NEGATIVE SARS-CoV-2 target nucleic acids are NOT DETECTED. The SARS-CoV-2 RNA is generally detectable in upper and lower  respiratory specimens during the acute phase of infection. The lowest  concentration of SARS-CoV-2 viral copies this assay can detect is 250  copies / mL. A negative result does not preclude SARS-CoV-2 infection  and should not be used as the sole basis for treatment or other  patient management decisions.  A negative result may occur with  improper specimen collection / handling, submission of specimen other  than nasopharyngeal swab, presence of viral mutation(s) within the  areas targeted by this assay, and inadequate number of viral copies  (<250 copies / mL). A negative result must be combined with clinical  observations, patient history, and epidemiological information. If result is POSITIVE SARS-CoV-2 target nucleic acids are DETECTED. The SARS-CoV-2 RNA is generally detectable in upper and lower  respiratory specimens dur ing the acute phase of infection.  Positive  results are indicative of active infection with SARS-CoV-2.  Clinical  correlation with patient history and other diagnostic information is  necessary to determine patient infection status.  Positive results do  not rule out bacterial infection or co-infection with other viruses. If result is PRESUMPTIVE POSTIVE SARS-CoV-2 nucleic acids MAY BE PRESENT.   A presumptive positive result was obtained on the submitted specimen  and confirmed on repeat testing.  While 2019 novel coronavirus  (SARS-CoV-2) nucleic acids may be present in the submitted sample  additional confirmatory testing may be necessary for epidemiological  and / or clinical management purposes  to differentiate between  SARS-CoV-2 and other Sarbecovirus currently known to infect humans.  If  clinically indicated additional testing with an alternate test  methodology [phone]) is advised. The SARS-CoV-2 RNA is generally  detectable in upper and lower respiratory sp ecimens during the acute  phase of infection. The expected result is Negative. Fact Sheet for Patients:  StrictlyIdeas.no Fact Sheet for Healthcare Providers: BankingDealers.co.za This test is not yet approved or cleared by the [place] FDA and has been authorized for detection and/or diagnosis of SARS-CoV-2 by FDA under an Emergency Use Authorization (EUA).  This EUA will remain in effect (meaning this test can be used) for the duration of the COVID-19 declaration under Section 564(b)(1) of the Act, 21 U.S.C. section 360bbb-3(b)(1), unless the authorization is terminated or revoked sooner. Performed at [facility], [place] [address] [place], [place] [address]   MRSA PCR Screening     Status: None   Collection Time: 02/18/19 12:25 AM   Specimen: Nasal Mucosa; Nasopharyngeal  Result Value Ref Range Status   MRSA by PCR NEGATIVE NEGATIVE Final    Comment:  The GeneXpert MRSA Assay (FDA approved for NASAL specimens only), is one component of a comprehensive MRSA colonization surveillance program. It is not intended to diagnose MRSA infection nor to guide or monitor treatment for MRSA infections. Performed at [facility], [place] [address], [place],  [address]      Liver Function Tests: Recent Labs  Lab 02/17/19 1721 02/18/19 0403  AST 25 21  ALT 17 16  ALKPHOS 75 60  BILITOT 0.6 0.9  PROT 7.5 6.2*  ALBUMIN 4.3 3.5   Recent Labs  Lab 02/17/19 1721  LIPASE 33   No results for input(s): AMMONIA in the last 168 hours.  Cardiac Enzymes: No results for input(s): CKTOTAL, CKMB, CKMBINDEX, TROPONINI in the last 168 hours. BNP (last 3 results) No results for input(s): BNP in the last 8760  hours.  ProBNP (last 3 results) No results for input(s): PROBNP in the last 8760 hours.    Studies: Ct Abdomen Pelvis W Contrast  Result Date: 02/17/2019 CLINICAL DATA:  Abdominal pain with nausea EXAM: CT ABDOMEN AND PELVIS WITH CONTRAST TECHNIQUE: Multidetector CT imaging of the abdomen and pelvis was performed using the standard protocol following bolus administration of intravenous contrast. CONTRAST:  198mL OMNIPAQUE IOHEXOL 300 MG/ML  SOLN COMPARISON:  01/08/2019 radiograph, CT 12/30/2018 FINDINGS: Lower chest: Lung bases demonstrate no consolidation or pleural effusion. The heart size is within normal limits. Large hiatal hernia. Hepatobiliary: Status post cholecystectomy. No focal hepatic abnormality. Stable mild intra and moderate extrahepatic biliary dilatation. Pancreas: Unremarkable. No pancreatic ductal dilatation or surrounding inflammatory changes. Spleen: Normal in size without focal abnormality. Adrenals/Urinary Tract: Adrenal glands are unremarkable. Kidneys are normal, without renal calculi, focal lesion, or hydronephrosis. Bladder is unremarkable. Stomach/Bowel: Mild fluid-filled stomach. Multiple dilated loops of fluid-filled small bowel, measuring up to 7 cm in the left lower quadrant with abrupt transition to decompressed distal small bowel and colon consistent with mechanical small bowel obstruction, high-grade. Transition point within the mid pelvis, slightly to the right of midline, series 2, image number 50. No bowel wall thickening. History of appendectomy. Descending and sigmoid colon diverticular disease without acute inflammatory change. Vascular/Lymphatic: Moderate aortic atherosclerosis. No aneurysm. No significantly enlarged lymph nodes. Reproductive: Status post hysterectomy. No adnexal masses. Other: Negative for free air or free fluid Musculoskeletal: Scoliosis of the spine. Chronic compression deformity T12. Mild chronic superior endplate deformity at L2. Grade 1  anterolisthesis L3 on L4 and L4 on L5. Diffuse degenerative changes. IMPRESSION: 1. Multiple markedly dilated fluid-filled loops of small bowel with abrupt transition to decompressed mid to distal small, findings are consistent with high-grade small bowel obstruction. Transition point visualized within the mid pelvis, slightly to the right of midline. Negative for free air 2. Status post cholecystectomy. Stable intra and extrahepatic biliary dilatation 3. Descending and sigmoid colon diverticular disease without acute inflammatory change Electronically Signed   By: Donavan Foil M.D.   On: 02/17/2019 19:26   Dg Abd Portable 1v-small Bowel Obstruction Protocol-24 Hr Delay  Result Date: 02/19/2019 CLINICAL DATA:  Small-bowel obstruction. EXAM: PORTABLE ABDOMEN - 1 VIEW COMPARISON:  02/18/2019. FINDINGS: Surgical clips right upper quadrant. NG tube noted in stable position. Multiple loops of dilated small bowel are again noted. Oral contrast noted in the colon. No free air noted. Contrast in the bladder. Surgical clips right upper quadrant. Degenerative changes scoliosis thoracic spine. IMPRESSION: NG tube in stable position. Persistent distended loops of small bowel again noted consistent small bowel obstruction. No significant change from prior exam.  Oral contrast noted in the colon. Electronically Signed   By: Marcello Moores  Register   On: 02/19/2019 07:50   Dg Abd Portable 1v-small Bowel Obstruction Protocol-initial, 8 Hr Delay  Result Date: 02/18/2019 CLINICAL DATA:  Small bowel obstruction EXAM: PORTABLE ABDOMEN - 1 VIEW COMPARISON:  02/18/2019 FINDINGS: NG tube tip is in the distal stomach. Dilated small bowel loops within the abdomen and pelvis compatible with small bowel obstruction. No change. Prior cholecystectomy. No organomegaly or free air. IMPRESSION: Stable high-grade small bowel obstruction pattern. Electronically Signed   By: Rolm Baptise M.D.   On: 02/18/2019 19:23   Dg Abd Portable 1v  Result  Date: 02/18/2019 CLINICAL DATA:  Nasogastric tube placement EXAM: PORTABLE ABDOMEN - 1 VIEW COMPARISON:  Same-day radiograph FINDINGS: Nasogastric tube is in place with distal tip and side hole projecting below the diaphragm within the stomach. Hiatal hernia is noted. Multiple markedly dilated loops of small bowel ago at throughout the abdomen compatible with a small-bowel obstruction. No gross free intraperitoneal air. IMPRESSION: Nasogastric tube in satisfactory positioning. Electronically Signed   By: Davina Poke M.D.   On: 02/18/2019 11:07   Dg Abd Portable 1v  Result Date: 02/18/2019 CLINICAL DATA:  NG tube placement EXAM: PORTABLE ABDOMEN - 1 VIEW COMPARISON:  CT 02/17/2019 FINDINGS: NG tube is in place with the tip in the proximal stomach. The side port is near the GE junction. Dilated small bowel loops again noted compatible with small bowel obstruction. IMPRESSION: NG tube tip in the proximal stomach. Electronically Signed   By: Rolm Baptise M.D.   On: 02/18/2019 02:59   Dg Abd Portable 1 View  Result Date: 02/17/2019 CLINICAL DATA:  Replacement of NG tube EXAM: PORTABLE ABDOMEN - 1 VIEW COMPARISON:  02/17/2019, CT 02/17/2019 FINDINGS: Esophageal tube side port at the level of hiatal hernia in the distal mediastinum with tip at the level of the diaphragmatic hiatus. Dilated small bowel consistent with obstruction. Contrast within the renal collecting systems IMPRESSION: Esophageal tube tip is partially coiled over the distal mediastinum within the patient's hiatal hernia, the tip is at the level of diaphragmatic hiatus. Further advancement and repositioning is recommended. Electronically Signed   By: Donavan Foil M.D.   On: 02/17/2019 21:45   Dg Abd Portable 1 View  Result Date: 02/17/2019 CLINICAL DATA:  Check NG placement EXAM: PORTABLE ABDOMEN - 1 VIEW COMPARISON:  Film from earlier in the same day. FINDINGS: Nasogastric catheter is again identified now coiled within the distal esophagus  with the tip at the level of the aortic knob. This should be removed completely and readvanced. Cardiac shadow is within normal limits. Contrast from recent CT is noted. IMPRESSION: Nasogastric catheter coiled within the esophagus as described. This should be removed and readvanced. Electronically Signed   By: Inez Catalina M.D.   On: 02/17/2019 21:38   Dg Abd Portable 1 View  Result Date: 02/17/2019 CLINICAL DATA:  NG tube placement. Small bowel obstruction. EXAM: PORTABLE ABDOMEN - 1 VIEW COMPARISON:  CT scan dated 02/17/2019 FINDINGS: The NG tube tip is at the level of the diaphragm. The patient has a known large hiatal hernia with narrowing of the gastric lumen at the level of the diaphragmatic hiatus as demonstrated on the prior CT scan. Persistent dilated small bowel loops. Contrast seen in the kidneys and bladder from recent CT scan. Thoracolumbar scoliosis. IMPRESSION: NG tube tip at the level of the diaphragm likely in the hiatal hernia above the diaphragmatic hiatus. Persistent small bowel  obstruction. Critical Value/emergent results were called by telephone at the time of interpretation on 02/17/2019 at 9:02 pm to Dr. Gilford Raid, who verbally acknowledged these results. Electronically Signed   By: Lorriane Shire M.D.   On: 02/17/2019 21:04   Vas [place] Upper Extremity Venous Duplex  Result Date: 02/19/2019 UPPER VENOUS STUDY  Indications: Swelling Risk Factors: None identified. Comparison Study: No prior studies. Performing Technologist: Oliver Hum RVT  Examination Guidelines: A complete evaluation includes B-mode imaging, spectral Doppler, color Doppler, and power Doppler as needed of all accessible portions of each vessel. Bilateral testing is considered an integral part of a complete examination. Limited examinations for reoccurring indications may be performed as noted.  Right Findings: +----------+------------+---------+-----------+----------+-------+ RIGHT      CompressiblePhasicitySpontaneousPropertiesSummary +----------+------------+---------+-----------+----------+-------+ IJV           Full       Yes       Yes                      +----------+------------+---------+-----------+----------+-------+ Subclavian    Full       Yes       Yes                      +----------+------------+---------+-----------+----------+-------+ Axillary      Full       Yes       Yes                      +----------+------------+---------+-----------+----------+-------+ Brachial      Full       Yes       Yes                      +----------+------------+---------+-----------+----------+-------+ Radial        Full                                          +----------+------------+---------+-----------+----------+-------+ Ulnar         Full                                          +----------+------------+---------+-----------+----------+-------+ Cephalic      Full                                          +----------+------------+---------+-----------+----------+-------+ Basilic       Full                                          +----------+------------+---------+-----------+----------+-------+  Left Findings: +----------+------------+---------+-----------+----------+-------+ LEFT      CompressiblePhasicitySpontaneousPropertiesSummary +----------+------------+---------+-----------+----------+-------+ Subclavian    Full       Yes       Yes                      +----------+------------+---------+-----------+----------+-------+  Summary:  Right: No evidence of deep vein thrombosis in the upper extremity. No evidence of superficial vein thrombosis in the upper extremity.  Left: No evidence of thrombosis in the subclavian.  *See table(s) above for measurements and  observations.    Preliminary      Admission status: Inpatient: Based on patients clinical presentation and evaluation of above clinical data, I have made  determination that patient meets Inpatient criteria at this time.  Time spent: 25 min  [facility] Pager [phone]. If 7PM-7AM, please contact night-coverage at [email or web address], Office  [phone]  password [email or web address]  02/19/2019, 2:23 PM  LOS: 2 days

## 2019-02-19 NOTE — Anesthesia Preprocedure Evaluation (Addendum)
Anesthesia Evaluation  Patient identified by MRN, date of birth, ID band Patient awake    Reviewed: Allergy & Precautions, H&P , NPO status , Patient's Chart, lab work & pertinent test results  History of Anesthesia Complications (+) PONV  Airway Mallampati: II  TM Distance: >3 FB Neck ROM: Full    Dental no notable dental hx. (+) Teeth Intact   Pulmonary shortness of breath, pneumonia, former smoker,    Pulmonary exam normal breath sounds clear to auscultation       Cardiovascular Exercise Tolerance: Good Normal cardiovascular exam Rhythm:Regular Rate:Normal     Neuro/Psych negative neurological ROS  negative psych ROS   GI/Hepatic Neg liver ROS, GERD  ,  Endo/Other  negative endocrine ROS  Renal/GU negative Renal ROS     Musculoskeletal  (+) Arthritis , Hx of soliosis   Abdominal   Peds  Hematology  (+) Blood dyscrasia, anemia ,   Anesthesia Other Findings   Reproductive/Obstetrics                             Lab Results  Component Value Date   CREATININE 0.93 02/19/2019   BUN 21 02/19/2019   NA 139 02/19/2019   K 4.1 02/19/2019   CL 104 02/19/2019   CO2 25 02/19/2019    Lab Results  Component Value Date   WBC 6.8 02/19/2019   HGB 13.3 02/19/2019   HCT 40.9 02/19/2019   MCV 100.0 02/19/2019   PLT 290 02/19/2019    Anesthesia Physical Anesthesia Plan  ASA: III  Anesthesia Plan: General   Post-op Pain Management:    Induction: Intravenous  PONV Risk Score and Plan: 4 or greater and Treatment may vary due to age or medical condition, Dexamethasone and Ondansetron  Airway Management Planned: Oral ETT  Additional Equipment:   Intra-op Plan:   Post-operative Plan: Extubation in OR  Informed Consent: I have reviewed the patients History and Physical, chart, labs and discussed the procedure including the risks, benefits and alternatives for the proposed  anesthesia with the patient or authorized representative who has indicated his/her understanding and acceptance.     Dental advisory given  Plan Discussed with: CRNA  Anesthesia Plan Comments:         Anesthesia Quick Evaluation

## 2019-02-19 NOTE — Anesthesia Postprocedure Evaluation (Signed)
Anesthesia Post Note  Patient: Ashley Howard  Procedure(s) Performed: LAPAROSCOPIC LYSIS OF ADHESIONS (N/A )     Patient location during evaluation: PACU Anesthesia Type: General Level of consciousness: awake and alert Pain management: pain level controlled Vital Signs Assessment: post-procedure vital signs reviewed and stable Respiratory status: spontaneous breathing, nonlabored ventilation, respiratory function stable and patient connected to nasal cannula oxygen Cardiovascular status: blood pressure returned to baseline and stable Postop Assessment: no apparent nausea or vomiting Anesthetic complications: no    Last Vitals:  Vitals:   02/19/19 2000 02/19/19 2021  BP:  (!) 150/94  Pulse:  83  Resp:  14  Temp: 36.9 C 36.7 C  SpO2:  98%    Last Pain:  Vitals:   02/19/19 2021  TempSrc: Oral  PainSc:                  [place]et [place]

## 2019-02-19 NOTE — Care Management Important Message (Signed)
Important Message  Patient Details IM Letter given to Cookie McGibboney RN to present to the Patient Name: Ashley Howard MRN: [id number] Date of Birth: 03-27-1946   Medicare Important Message Given:  Yes     Kerin Salen 02/19/2019, 10:19 AM

## 2019-02-20 ENCOUNTER — Encounter (HOSPITAL_COMMUNITY): Payer: Self-pay | Admitting: Surgery

## 2019-02-20 LAB — CBC
HCT: 33.7 % — ABNORMAL LOW (ref 36.0–46.0)
Hemoglobin: 11 g/dL — ABNORMAL LOW (ref 12.0–15.0)
MCH: 33 pg (ref 26.0–34.0)
MCHC: 32.6 g/dL (ref 30.0–36.0)
MCV: 101.2 fL — ABNORMAL HIGH (ref 80.0–100.0)
Platelets: 226 10*3/uL (ref 150–400)
RBC: 3.33 MIL/uL — ABNORMAL LOW (ref 3.87–5.11)
RDW: 12.2 % (ref 11.5–15.5)
WBC: 7.5 10*3/uL (ref 4.0–10.5)
nRBC: 0 % (ref 0.0–0.2)

## 2019-02-20 LAB — COMPREHENSIVE METABOLIC PANEL
ALT: 16 U/L (ref 0–44)
AST: 20 U/L (ref 15–41)
Albumin: 3.2 g/dL — ABNORMAL LOW (ref 3.5–5.0)
Alkaline Phosphatase: 51 U/L (ref 38–126)
Anion gap: 10 (ref 5–15)
BUN: 15 mg/dL (ref 8–23)
CO2: 25 mmol/L (ref 22–32)
Calcium: 8.1 mg/dL — ABNORMAL LOW (ref 8.9–10.3)
Chloride: 104 mmol/L (ref 98–111)
Creatinine, Ser: 0.67 mg/dL (ref 0.44–1.00)
GFR calc Af Amer: >60 mL/min (ref >60)
GFR calc non Af Amer: >60 mL/min (ref >60)
Glucose, Bld: 83 mg/dL (ref 70–99)
Potassium: 3.6 mmol/L (ref 3.5–5.1)
Sodium: 139 mmol/L (ref 135–145)
Total Bilirubin: 0.4 mg/dL (ref 0.3–1.2)
Total Protein: 5.9 g/dL — ABNORMAL LOW (ref 6.5–8.1)

## 2019-02-20 MED ORDER — FENTANYL CITRATE (PF) 100 MCG/2ML IJ SOLN: 25.0000 ug | INTRAMUSCULAR | Status: DC | PRN

## 2019-02-20 MED ORDER — KCL IN DEXTROSE-NACL 20-5-0.45 MEQ/L-%-% IV SOLN
INTRAVENOUS | Status: DC
  Administered 2019-02-20 – 2019-02-23 (×5): via INTRAVENOUS
  Filled 2019-02-20 (×7): qty 1000

## 2019-02-20 MED ORDER — MORPHINE SULFATE (PF) 2 MG/ML IV SOLN
1.0000 mg | INTRAVENOUS | Status: DC | PRN
  Administered 2019-02-20: 1 mg via INTRAVENOUS
  Filled 2019-02-20: qty 1

## 2019-02-20 MED ORDER — ACETAMINOPHEN 325 MG PO TABS
650.0000 mg | ORAL_TABLET | Freq: Four times a day (QID) | ORAL | Status: DC | PRN
  Administered 2019-02-20: 10:00:00 650 mg via ORAL

## 2019-02-20 NOTE — Progress Notes (Signed)
[facility]:  [phone] General Surgery Progress Note   LOS: 3 days  POD -  1 Day Post-Op  Chief Complaint: Bowel obstruction  Assessment and Plan: 1.  LAPAROSCOPIC LYSIS OF ADHESIONS - 02/19/2019 - Gross  On Rocephin - will stop this  Removed NGT and will start sips from the floor  Needs to ambulate  2.  DVT prophylaxis - Lovenox   Principal Problem:   SBO (small bowel obstruction) s/p lap adhesiolysis 02/19/2019 Active Problems:   GERD (gastroesophageal reflux disease)   Hyponatremia  Subjective:  Passed flatus.  Feels better.  Has one son, 81 yo, who lives in [place].  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  Objective:   Vitals:   02/20/19 0501 02/20/19 0551  BP: (!) 127/58 129/74  Pulse: 73 67  Resp: 14   Temp: 98.5 F (36.9 C)   SpO2: 93%      Intake/Output from previous day:  08/07 0701 - 08/08 0700 In: 947.3 [P.O.:120; I.V.:627.3; IV Piggyback:200] Out: 1275 [Urine:900; Emesis/NG output:350; Blood:25]  Intake/Output this shift:  No intake/output data recorded.   Physical Exam:   General: WN older WF who is alert and oriented.   NGT in place.   HEENT: Normal. Pupils equal. .   Lungs: Clear   Abdomen: Mild distention.  Has BS.   Wound: Clean   Lab Results:    Recent Labs    02/18/19 0403 02/19/19 0334  WBC 6.8 6.8  HGB 12.2 13.3  HCT 36.9 40.9  PLT 286 290    BMET   Recent Labs    02/18/19 0403 02/19/19 0334  NA 132* 139  K 4.1 4.1  CL 100 104  CO2 22 25  GLUCOSE 119* 128*  BUN 20 21  CREATININE 0.90 0.93  CALCIUM 8.7* 9.1    PT/INR   Recent Labs    02/17/19 2228  LABPROT 11.8  INR 0.9    ABG  No results for input(s): PHART, HCO3 in the last 72 hours.  Invalid input(s): PCO2, PO2   Studies/Results:  Dg Abd Portable 1v-small Bowel Obstruction Protocol-24 Hr Delay  Result Date: 02/19/2019 CLINICAL DATA:  Small-bowel obstruction. EXAM: PORTABLE ABDOMEN - 1 VIEW COMPARISON:   02/18/2019. FINDINGS: Surgical clips right upper quadrant. NG tube noted in stable position. Multiple loops of dilated small bowel are again noted. Oral contrast noted in the colon. No free air noted. Contrast in the bladder. Surgical clips right upper quadrant. Degenerative changes scoliosis thoracic spine. IMPRESSION: NG tube in stable position. Persistent distended loops of small bowel again noted consistent small bowel obstruction. No significant change from prior exam. Oral contrast noted in the colon. Electronically Signed   By: Marcello Moores  Register   On: 02/19/2019 07:50   Dg Abd Portable 1v-small Bowel Obstruction Protocol-initial, 8 Hr Delay  Result Date: 02/18/2019 CLINICAL DATA:  Small bowel obstruction EXAM: PORTABLE ABDOMEN - 1 VIEW COMPARISON:  02/18/2019 FINDINGS: NG tube tip is in the distal stomach. Dilated small bowel loops within the abdomen and pelvis compatible with small bowel obstruction. No change. Prior cholecystectomy. No organomegaly or free air. IMPRESSION: Stable high-grade small bowel obstruction pattern. Electronically Signed   By: Rolm Baptise M.D.   On: 02/18/2019 19:23   Dg Abd Portable 1v  Result Date: 02/18/2019 CLINICAL DATA:  Nasogastric tube placement EXAM: PORTABLE ABDOMEN - 1 VIEW COMPARISON:  Same-day radiograph FINDINGS: Nasogastric tube is in place with distal tip and side hole projecting below the  diaphragm within the stomach. Hiatal hernia is noted. Multiple markedly dilated loops of small bowel ago at throughout the abdomen compatible with a small-bowel obstruction. No gross free intraperitoneal air. IMPRESSION: Nasogastric tube in satisfactory positioning. Electronically Signed   By: Davina Poke M.D.   On: 02/18/2019 11:07   Vas [place] Upper Extremity Venous Duplex  Result Date: 02/19/2019 UPPER VENOUS STUDY  Indications: Swelling Risk Factors: None identified. Comparison Study: No prior studies. Performing Technologist: Oliver Hum RVT  Examination  Guidelines: A complete evaluation includes B-mode imaging, spectral Doppler, color Doppler, and power Doppler as needed of all accessible portions of each vessel. Bilateral testing is considered an integral part of a complete examination. Limited examinations for reoccurring indications may be performed as noted.  Right Findings: +----------+------------+---------+-----------+----------+-------+ RIGHT     CompressiblePhasicitySpontaneousPropertiesSummary +----------+------------+---------+-----------+----------+-------+ IJV           Full       Yes       Yes                      +----------+------------+---------+-----------+----------+-------+ Subclavian    Full       Yes       Yes                      +----------+------------+---------+-----------+----------+-------+ Axillary      Full       Yes       Yes                      +----------+------------+---------+-----------+----------+-------+ Brachial      Full       Yes       Yes                      +----------+------------+---------+-----------+----------+-------+ Radial        Full                                          +----------+------------+---------+-----------+----------+-------+ Ulnar         Full                                          +----------+------------+---------+-----------+----------+-------+ Cephalic      Full                                          +----------+------------+---------+-----------+----------+-------+ Basilic       Full                                          +----------+------------+---------+-----------+----------+-------+  Left Findings: +----------+------------+---------+-----------+----------+-------+ LEFT      CompressiblePhasicitySpontaneousPropertiesSummary +----------+------------+---------+-----------+----------+-------+ Subclavian    Full       Yes       Yes                       +----------+------------+---------+-----------+----------+-------+  Summary:  Right: No evidence of deep vein thrombosis in the upper extremity. No evidence of superficial vein thrombosis in the upper extremity.  Left: No evidence of  thrombosis in the subclavian.  *See table(s) above for measurements and observations.    Preliminary      Anti-infectives:   Anti-infectives (From admission, onward)   Start     Dose/Rate Route Frequency Ordered Stop   02/19/19 1757  metroNIDAZOLE (FLAGYL) 5-0.79 MG/ML-% IVPB    Note to Pharmacy: Sandrea Matte   : cabinet override      02/19/19 1757 02/19/19 1815   02/19/19 1757  sodium chloride 0.9 % with cefTRIAXone (ROCEPHIN) ADS Med    Note to Pharmacy: Sandrea Matte   : cabinet override      02/19/19 1757 02/20/19 0559   02/17/19 2315  cefTRIAXone (ROCEPHIN) 1 g in sodium chloride 0.9 % 100 mL IVPB     1 g 200 mL/hr over 30 Minutes Intravenous Daily at bedtime 02/17/19 2311        Alphonsa Overall, MD, FACS Pager: [facility]: [phone] 02/20/2019

## 2019-02-20 NOTE — Progress Notes (Signed)
[facility]  PROGRESS NOTE  Ashley Howard KNL:[id number] DOB: [birth year] DOA: 02/17/2019 PCP: Lynnell Catalan, FNP   Brief HPI:   73 year old female with a history of chronic back pain, GERD, history of SBO, hysterectomy, appendectomy came to the hospital with abdominal pain nausea after breakfast.  Also had 3 episodes of vomiting.  CT abdomen pelvis showed multiple markedly dilated fluid-filled loops of small bowel with abrupt transition to decompressed mid to distal small findings consistent with high-grade small bowel obstruction. NG tube was inserted  Subjective   Patient seen and examined, status post laparoscopic lysis of adhesions for SBO.  Still NG tube is in place.  Surgery to follow and make recommendations.   Assessment/Plan:    1. Small bowel obstruction-patient has history of multiple surgeries in the past, likely cause of SBO.  NG tube to intermittent suction in place, patient is status post laparoscopic lysis of adhesions.  General surgery following.  2. GERD-continue Protonix  3. Right arm swelling-venous duplex of right upper extremity obtained, is negative for DVT.  4. Hyponatremia-likely from GI losses, resolved, today sodium is 139.  5. UTI-patient has ab normal UA, was started on Rocephin 1 g IV daily.  Urine culture showed insignificant growth.  Agree with discontinuing Rocephin.   CBC: Recent Labs  Lab 02/17/19 1721 02/18/19 0403 02/19/19 0334 02/20/19 0954  WBC 15.3* 6.8 6.8 7.5  NEUTROABS 13.6*  --   --   --   HGB 14.8 12.2 13.3 11.0*  HCT 43.8 36.9 40.9 33.7*  MCV 98.0 97.4 100.0 101.2*  PLT 308 286 290 790    Basic Metabolic Panel: Recent Labs  Lab 02/17/19 1721 02/18/19 0403 02/19/19 0334 02/20/19 0954  NA 131* 132* 139 139  K 4.3 4.1 4.1 3.6  CL 96* 100 104 104  CO2 21* 22 25 25   GLUCOSE 133* 119* 128* 83  BUN 18 20 21 15   CREATININE 0.97 0.90 0.93 0.67  CALCIUM 10.2 8.7* 9.1 8.1*     DVT prophylaxis: Lovenox  Code  Status: Full code  Family Communication: No family at bedside  Disposition Plan: likely home when medically ready for discharge   Scheduled medications:  . bisacodyl  10 mg Rectal Daily  . enoxaparin (LOVENOX) injection  40 mg Subcutaneous Q24H  . lip balm  1 application Topical BID  . pantoprazole (PROTONIX) IV  40 mg Intravenous QHS    Consultants:  Surgery  Procedures:     Antibiotics:   Anti-infectives (From admission, onward)   Start     Dose/Rate Route Frequency Ordered Stop   02/19/19 1757  metroNIDAZOLE (FLAGYL) 5-0.79 MG/ML-% IVPB    Note to Pharmacy: Sandrea Matte   : cabinet override      02/19/19 1757 02/19/19 1815   02/19/19 1757  sodium chloride 0.9 % with cefTRIAXone (ROCEPHIN) ADS Med    Note to Pharmacy: Sandrea Matte   : cabinet override      02/19/19 1757 02/20/19 0559   02/17/19 2315  cefTRIAXone (ROCEPHIN) 1 g in sodium chloride 0.9 % 100 mL IVPB  Status:  Discontinued     1 g 200 mL/hr over 30 Minutes Intravenous Daily at bedtime 02/17/19 2311 02/20/19 0945       Objective   Vitals:   02/20/19 0500 02/20/19 0501 02/20/19 0550 02/20/19 0551  BP:  (!) 127/58  129/74  Pulse:  73  67  Resp:  14    Temp:  98.5 F (36.9 C)    TempSrc:  Oral    SpO2:  93%    Weight: 62 kg  65.3 kg   Height:        Intake/Output Summary (Last 24 hours) at 02/20/2019 1247 Last data filed at 02/20/2019 1024 Gross per 24 hour  Intake 947.33 ml  Output 1175 ml  Net -227.67 ml   Filed Weights   02/19/19 0407 02/20/19 0500 02/20/19 0550  Weight: 60.7 kg 62 kg 65.3 kg     Physical Examination:  General-appears in no acute distress Heart-S1-S2, regular, no murmur auscultated Lungs-clear to auscultation bilaterally, no wheezing or crackles auscultated Abdomen-soft, nontender, no organomegaly Extremities-no edema in the lower extremities Neuro-alert, oriented x3, no focal deficit noted    Data Reviewed: I have personally reviewed following labs and  imaging studies   Recent Results (from the past 240 hour(s))  SARS Coronavirus 2 [facility] order, Performed in [facility] hospital lab) Nasopharyngeal Nasopharyngeal Swab     Status: None   Collection Time: 02/17/19  3:56 PM   Specimen: Nasopharyngeal Swab  Result Value Ref Range Status   SARS Coronavirus 2 NEGATIVE NEGATIVE Final    Comment: (NOTE) If result is NEGATIVE SARS-CoV-2 target nucleic acids are NOT DETECTED. The SARS-CoV-2 RNA is generally detectable in upper and lower  respiratory specimens during the acute phase of infection. The lowest  concentration of SARS-CoV-2 viral copies this assay can detect is 250  copies / mL. A negative result does not preclude SARS-CoV-2 infection  and should not be used as the sole basis for treatment or other  patient management decisions.  A negative result may occur with  improper specimen collection / handling, submission of specimen other  than nasopharyngeal swab, presence of viral mutation(s) within the  areas targeted by this assay, and inadequate number of viral copies  (<250 copies / mL). A negative result must be combined with clinical  observations, patient history, and epidemiological information. If result is POSITIVE SARS-CoV-2 target nucleic acids are DETECTED. The SARS-CoV-2 RNA is generally detectable in upper and lower  respiratory specimens dur ing the acute phase of infection.  Positive  results are indicative of active infection with SARS-CoV-2.  Clinical  correlation with patient history and other diagnostic information is  necessary to determine patient infection status.  Positive results do  not rule out bacterial infection or co-infection with other viruses. If result is PRESUMPTIVE POSTIVE SARS-CoV-2 nucleic acids MAY BE PRESENT.   A presumptive positive result was obtained on the submitted specimen  and confirmed on repeat testing.  While 2019 novel coronavirus  (SARS-CoV-2) nucleic acids may be present in  the submitted sample  additional confirmatory testing may be necessary for epidemiological  and / or clinical management purposes  to differentiate between  SARS-CoV-2 and other Sarbecovirus currently known to infect humans.  If clinically indicated additional testing with an alternate test  methodology [phone]) is advised. The SARS-CoV-2 RNA is generally  detectable in upper and lower respiratory sp ecimens during the acute  phase of infection. The expected result is Negative. Fact Sheet for Patients:  StrictlyIdeas.no Fact Sheet for Healthcare Providers: BankingDealers.co.za This test is not yet approved or cleared by the [place] FDA and has been authorized for detection and/or diagnosis of SARS-CoV-2 by FDA under an Emergency Use Authorization (EUA).  This EUA will remain in effect (meaning this test can be used) for the duration of the COVID-19 declaration under Section 564(b)(1) of the Act, 21 U.S.C. section 360bbb-3(b)(1), unless the authorization is terminated or  revoked sooner. Performed at [facility], [place] [address] [place], [place] [address]   MRSA PCR Screening     Status: None   Collection Time: 02/18/19 12:25 AM   Specimen: Nasal Mucosa; Nasopharyngeal  Result Value Ref Range Status   MRSA by PCR NEGATIVE NEGATIVE Final    Comment:        The GeneXpert MRSA Assay (FDA approved for NASAL specimens only), is one component of a comprehensive MRSA colonization surveillance program. It is not intended to diagnose MRSA infection nor to guide or monitor treatment for MRSA infections. Performed at [facility], [place] [address] [place], [place] [address]   Culture, Urine     Status: Abnormal   Collection Time: 02/18/19  5:00 PM   Specimen: Urine, Random  Result Value Ref Range Status   Specimen Description   Final    URINE, RANDOM Performed at [facility] [address] [place], [place] [address]    Special Requests   Final    NONE Performed at [facility], [place] [address] [place], [place] [address]    Culture (A)  Final    <10,000 COLONIES/mL INSIGNIFICANT GROWTH Performed at [facility] [address], [place], [place] [address]    Report Status 02/19/2019 FINAL  Final  Surgical PCR screen     Status: Abnormal   Collection Time: 02/19/19  8:19 AM   Specimen: Nasal Mucosa; Nasal Swab  Result Value Ref Range Status   MRSA, PCR NEGATIVE NEGATIVE Final   Staphylococcus aureus POSITIVE (A) NEGATIVE Final    Comment: (NOTE) The Xpert SA Assay (FDA approved for NASAL specimens in patients 56 years of age and older), is one component of a comprehensive surveillance program. It is not intended to diagnose infection nor to guide or monitor treatment. Performed at [facility], [place] [address] [place], [place] [address]      Liver Function Tests: Recent Labs  Lab 02/17/19 1721 02/18/19 0403 02/20/19 0954  AST 25 21 20   ALT 17 16 16   ALKPHOS 75 60 51  BILITOT 0.6 0.9 0.4  PROT 7.5 6.2* 5.9*  ALBUMIN 4.3 3.5 3.2*   Recent Labs  Lab 02/17/19 1721  LIPASE 33   No results for input(s): AMMONIA in the last 168 hours.  Cardiac Enzymes: No results for input(s): CKTOTAL, CKMB, CKMBINDEX, TROPONINI in the last 168 hours. BNP (last 3 results) No results for input(s): BNP in the last 8760 hours.  ProBNP (last 3 results) No results for input(s): PROBNP in the last 8760 hours.    Studies: Dg Abd Portable 1v-small Bowel Obstruction Protocol-24 Hr Delay  Result Date: 02/19/2019 CLINICAL DATA:  Small-bowel obstruction. EXAM: PORTABLE ABDOMEN - 1 VIEW COMPARISON:  02/18/2019. FINDINGS: Surgical clips right upper quadrant. NG tube noted in stable position. Multiple loops of dilated small bowel are again noted. Oral contrast noted in the colon. No free air noted. Contrast in the bladder.  Surgical clips right upper quadrant. Degenerative changes scoliosis thoracic spine. IMPRESSION: NG tube in stable position. Persistent distended loops of small bowel again noted consistent small bowel obstruction. No significant change from prior exam. Oral contrast noted in the colon. Electronically Signed   By: Marcello Moores  Register   On: 02/19/2019 07:50   Dg Abd Portable 1v-small Bowel Obstruction Protocol-initial, 8 Hr Delay  Result Date: 02/18/2019 CLINICAL DATA:  Small bowel obstruction EXAM: PORTABLE ABDOMEN - 1 VIEW COMPARISON:  02/18/2019 FINDINGS:  NG tube tip is in the distal stomach. Dilated small bowel loops within the abdomen and pelvis compatible with small bowel obstruction. No change. Prior cholecystectomy. No organomegaly or free air. IMPRESSION: Stable high-grade small bowel obstruction pattern. Electronically Signed   By: Rolm Baptise M.D.   On: 02/18/2019 19:23   Vas [place] Upper Extremity Venous Duplex  Result Date: 02/19/2019 UPPER VENOUS STUDY  Indications: Swelling Risk Factors: None identified. Comparison Study: No prior studies. Performing Technologist: Oliver Hum RVT  Examination Guidelines: A complete evaluation includes B-mode imaging, spectral Doppler, color Doppler, and power Doppler as needed of all accessible portions of each vessel. Bilateral testing is considered an integral part of a complete examination. Limited examinations for reoccurring indications may be performed as noted.  Right Findings: +----------+------------+---------+-----------+----------+-------+ RIGHT     CompressiblePhasicitySpontaneousPropertiesSummary +----------+------------+---------+-----------+----------+-------+ IJV           Full       Yes       Yes                      +----------+------------+---------+-----------+----------+-------+ Subclavian    Full       Yes       Yes                      +----------+------------+---------+-----------+----------+-------+ Axillary      Full        Yes       Yes                      +----------+------------+---------+-----------+----------+-------+ Brachial      Full       Yes       Yes                      +----------+------------+---------+-----------+----------+-------+ Radial        Full                                          +----------+------------+---------+-----------+----------+-------+ Ulnar         Full                                          +----------+------------+---------+-----------+----------+-------+ Cephalic      Full                                          +----------+------------+---------+-----------+----------+-------+ Basilic       Full                                          +----------+------------+---------+-----------+----------+-------+  Left Findings: +----------+------------+---------+-----------+----------+-------+ LEFT      CompressiblePhasicitySpontaneousPropertiesSummary +----------+------------+---------+-----------+----------+-------+ Subclavian    Full       Yes       Yes                      +----------+------------+---------+-----------+----------+-------+  Summary:  Right: No evidence of deep vein thrombosis in the upper extremity. No evidence of superficial vein thrombosis in the upper extremity.  Left: No evidence  of thrombosis in the subclavian.  *See table(s) above for measurements and observations.    Preliminary      Admission status: Inpatient: Based on patients clinical presentation and evaluation of above clinical data, I have made determination that patient meets Inpatient criteria at this time.  Time spent: 25 min  [facility] Pager [phone]. If 7PM-7AM, please contact night-coverage at [email or web address], Office  [phone]  password [email or web address]  02/20/2019, 12:47 PM  LOS: 3 days

## 2019-02-21 LAB — BASIC METABOLIC PANEL
Anion gap: 8 (ref 5–15)
BUN: 7 mg/dL — ABNORMAL LOW (ref 8–23)
CO2: 25 mmol/L (ref 22–32)
Calcium: 7.9 mg/dL — ABNORMAL LOW (ref 8.9–10.3)
Chloride: 104 mmol/L (ref 98–111)
Creatinine, Ser: 0.57 mg/dL (ref 0.44–1.00)
GFR calc Af Amer: >60 mL/min (ref >60)
GFR calc non Af Amer: >60 mL/min (ref >60)
Glucose, Bld: 103 mg/dL — ABNORMAL HIGH (ref 70–99)
Potassium: 3.4 mmol/L — ABNORMAL LOW (ref 3.5–5.1)
Sodium: 137 mmol/L (ref 135–145)

## 2019-02-21 MED ORDER — MUPIROCIN 2 % EX OINT
1.0000 "application " | TOPICAL_OINTMENT | Freq: Two times a day (BID) | CUTANEOUS | Status: DC
  Administered 2019-02-21 – 2019-02-23 (×4): 1 via NASAL
  Filled 2019-02-21: qty 22

## 2019-02-21 MED ORDER — CHLORHEXIDINE GLUCONATE CLOTH 2 % EX PADS
6.0000 | MEDICATED_PAD | Freq: Every day | CUTANEOUS | Status: DC
  Administered 2019-02-21 – 2019-02-22 (×2): 6 via TOPICAL

## 2019-02-21 MED ORDER — POTASSIUM CHLORIDE CRYS ER 20 MEQ PO TBCR
20.0000 meq | EXTENDED_RELEASE_TABLET | Freq: Once | ORAL | Status: AC
  Administered 2019-02-21: 13:00:00 20 meq via ORAL
  Filled 2019-02-21: qty 1

## 2019-02-21 NOTE — Progress Notes (Signed)
Patient refused to ambulate in hallway any more tonight. Will ask again in the morning.

## 2019-02-21 NOTE — Progress Notes (Signed)
This nurse received pt from [place], Therapist, sports. Agree with previous assessment. Will continue to monitor.

## 2019-02-21 NOTE — Progress Notes (Signed)
[facility]  PROGRESS NOTE  Ashley Howard TOI:[id number] DOB: 07/23/1945 DOA: 02/17/2019 PCP: Lynnell Catalan, FNP   Brief HPI:   73 year old female with a history of chronic back pain, GERD, history of SBO, hysterectomy, appendectomy came to the hospital with abdominal pain nausea after breakfast.  Also had 3 episodes of vomiting.  CT abdomen pelvis showed multiple markedly dilated fluid-filled loops of small bowel with abrupt transition to decompressed mid to distal small findings consistent with high-grade small bowel obstruction. NG tube was inserted  Subjective   Patient seen and examined, reportedly had multiple BMs yesterday.   Assessment/Plan:    1. Small bowel obstruction status post adhesion lysis laparoscopically by Dr. Britt Bolognese had multiple BMs yesterday.  Started on clear liquid diet.  General surgery following.  2. GERD-continue Protonix  3. Right arm swelling-venous duplex of right upper extremity obtained, is negative for DVT.  4. Hyponatremia-likely from GI losses, resolved, today sodium is 139.  5. Hypokalemia-patient is on potassium supplementation, will give additional 20 mEq of K. Dur.  Check BMP in a.m.  6. UTI-patient has ab normal UA, was started on Rocephin 1 g IV daily.  Urine culture showed insignificant growth.  Agree with discontinuing Rocephin.   CBC: Recent Labs  Lab 02/17/19 1721 02/18/19 0403 02/19/19 0334 02/20/19 0954  WBC 15.3* 6.8 6.8 7.5  NEUTROABS 13.6*  --   --   --   HGB 14.8 12.2 13.3 11.0*  HCT 43.8 36.9 40.9 33.7*  MCV 98.0 97.4 100.0 101.2*  PLT 308 286 290 833    Basic Metabolic Panel: Recent Labs  Lab 02/17/19 1721 02/18/19 0403 02/19/19 0334 02/20/19 0954 02/21/19 0241  NA 131* 132* 139 139 137  K 4.3 4.1 4.1 3.6 3.4*  CL 96* 100 104 104 104  CO2 21* 22 25 25 25   GLUCOSE 133* 119* 128* 83 103*  BUN 18 20 21 15  7*  CREATININE 0.97 0.90 0.93 0.67 0.57  CALCIUM 10.2 8.7* 9.1 8.1* 7.9*     DVT  prophylaxis: Lovenox  Code Status: Full code  Family Communication: No family at bedside  Disposition Plan: likely home when medically ready for discharge   Scheduled medications:  . bisacodyl  10 mg Rectal Daily  . enoxaparin (LOVENOX) injection  40 mg Subcutaneous Q24H  . lip balm  1 application Topical BID  . pantoprazole (PROTONIX) IV  40 mg Intravenous QHS    Consultants:  Surgery  Procedures:     Antibiotics:   Anti-infectives (From admission, onward)   Start     Dose/Rate Route Frequency Ordered Stop   02/19/19 1757  metroNIDAZOLE (FLAGYL) 5-0.79 MG/ML-% IVPB    Note to Pharmacy: Sandrea Matte   : cabinet override      02/19/19 1757 02/19/19 1815   02/19/19 1757  sodium chloride 0.9 % with cefTRIAXone (ROCEPHIN) ADS Med    Note to Pharmacy: Sandrea Matte   : cabinet override      02/19/19 1757 02/20/19 0559   02/17/19 2315  cefTRIAXone (ROCEPHIN) 1 g in sodium chloride 0.9 % 100 mL IVPB  Status:  Discontinued     1 g 200 mL/hr over 30 Minutes Intravenous Daily at bedtime 02/17/19 2311 02/20/19 0945       Objective   Vitals:   02/20/19 0551 02/20/19 1258 02/20/19 2158 02/21/19 0521  BP: 129/74 140/72 (!) 145/81 137/78  Pulse: 67 61 67 62  Resp:  18 18 18   Temp:  97.7 F (36.5 C) 98.9  F (37.2 C) 98.2 F (36.8 C)  TempSrc:  Oral Oral Oral  SpO2:  96% 97% 95%  Weight:    60 kg  Height:        Intake/Output Summary (Last 24 hours) at 02/21/2019 1201 Last data filed at 02/21/2019 1158 Gross per 24 hour  Intake 1013.22 ml  Output 300 ml  Net 713.22 ml   Filed Weights   02/20/19 0500 02/20/19 0550 02/21/19 0521  Weight: 62 kg 65.3 kg 60 kg     Physical Examination:  General-appears in no acute distress Heart-S1-S2, regular, no murmur auscultated Lungs-clear to auscultation bilaterally, no wheezing or crackles auscultated Abdomen-soft, nontender, no organomegaly Extremities-no edema in the lower extremities Neuro-alert, oriented x3, no  focal deficit noted    Data Reviewed: I have personally reviewed following labs and imaging studies   Recent Results (from the past 240 hour(s))  SARS Coronavirus 2 [facility] order, Performed in [facility] hospital lab) Nasopharyngeal Nasopharyngeal Swab     Status: None   Collection Time: 02/17/19  3:56 PM   Specimen: Nasopharyngeal Swab  Result Value Ref Range Status   SARS Coronavirus 2 NEGATIVE NEGATIVE Final    Comment: (NOTE) If result is NEGATIVE SARS-CoV-2 target nucleic acids are NOT DETECTED. The SARS-CoV-2 RNA is generally detectable in upper and lower  respiratory specimens during the acute phase of infection. The lowest  concentration of SARS-CoV-2 viral copies this assay can detect is 250  copies / mL. A negative result does not preclude SARS-CoV-2 infection  and should not be used as the sole basis for treatment or other  patient management decisions.  A negative result may occur with  improper specimen collection / handling, submission of specimen other  than nasopharyngeal swab, presence of viral mutation(s) within the  areas targeted by this assay, and inadequate number of viral copies  (<250 copies / mL). A negative result must be combined with clinical  observations, patient history, and epidemiological information. If result is POSITIVE SARS-CoV-2 target nucleic acids are DETECTED. The SARS-CoV-2 RNA is generally detectable in upper and lower  respiratory specimens dur ing the acute phase of infection.  Positive  results are indicative of active infection with SARS-CoV-2.  Clinical  correlation with patient history and other diagnostic information is  necessary to determine patient infection status.  Positive results do  not rule out bacterial infection or co-infection with other viruses. If result is PRESUMPTIVE POSTIVE SARS-CoV-2 nucleic acids MAY BE PRESENT.   A presumptive positive result was obtained on the submitted specimen  and confirmed on repeat  testing.  While 2019 novel coronavirus  (SARS-CoV-2) nucleic acids may be present in the submitted sample  additional confirmatory testing may be necessary for epidemiological  and / or clinical management purposes  to differentiate between  SARS-CoV-2 and other Sarbecovirus currently known to infect humans.  If clinically indicated additional testing with an alternate test  methodology [phone]) is advised. The SARS-CoV-2 RNA is generally  detectable in upper and lower respiratory sp ecimens during the acute  phase of infection. The expected result is Negative. Fact Sheet for Patients:  StrictlyIdeas.no Fact Sheet for Healthcare Providers: BankingDealers.co.za This test is not yet approved or cleared by the [place] FDA and has been authorized for detection and/or diagnosis of SARS-CoV-2 by FDA under an Emergency Use Authorization (EUA).  This EUA will remain in effect (meaning this test can be used) for the duration of the COVID-19 declaration under Section 564(b)(1) of the Act, 21  U.S.C. section 360bbb-3(b)(1), unless the authorization is terminated or revoked sooner. Performed at [facility], [place] [address] [place], [place] [address]   MRSA PCR Screening     Status: None   Collection Time: 02/18/19 12:25 AM   Specimen: Nasal Mucosa; Nasopharyngeal  Result Value Ref Range Status   MRSA by PCR NEGATIVE NEGATIVE Final    Comment:        The GeneXpert MRSA Assay (FDA approved for NASAL specimens only), is one component of a comprehensive MRSA colonization surveillance program. It is not intended to diagnose MRSA infection nor to guide or monitor treatment for MRSA infections. Performed at [facility], [place] [address] [place], [place] [address]   Culture, Urine     Status: Abnormal   Collection Time: 02/18/19  5:00 PM   Specimen: Urine, Random  Result Value Ref Range Status    Specimen Description   Final    URINE, RANDOM Performed at [place] [address] [place], [place] [address]    Special Requests   Final    NONE Performed at [facility], [place] [address] [place], [place] [address]    Culture (A)  Final    <10,000 COLONIES/mL INSIGNIFICANT GROWTH Performed at [place] [address] [place], [place] [address]    Report Status 02/19/2019 FINAL  Final  Surgical PCR screen     Status: Abnormal   Collection Time: 02/19/19  8:19 AM   Specimen: Nasal Mucosa; Nasal Swab  Result Value Ref Range Status   MRSA, PCR NEGATIVE NEGATIVE Final   Staphylococcus aureus POSITIVE (A) NEGATIVE Final    Comment: (NOTE) The Xpert SA Assay (FDA approved for NASAL specimens in patients 56 years of age and older), is one component of a comprehensive surveillance program. It is not intended to diagnose infection nor to guide or monitor treatment. Performed at [facility], [place] [address], [place], [place] [address]      Liver Function Tests: Recent Labs  Lab 02/17/19 1721 02/18/19 0403 02/20/19 0954  AST 25 21 20   ALT 17 16 16   ALKPHOS 75 60 51  BILITOT 0.6 0.9 0.4  PROT 7.5 6.2* 5.9*  ALBUMIN 4.3 3.5 3.2*   Recent Labs  Lab 02/17/19 1721  LIPASE 33   No results for input(s): AMMONIA in the last 168 hours.  Cardiac Enzymes: No results for input(s): CKTOTAL, CKMB, CKMBINDEX, TROPONINI in the last 168 hours. BNP (last 3 results) No results for input(s): BNP in the last 8760 hours.  ProBNP (last 3 results) No results for input(s): PROBNP in the last 8760 hours.    Studies: No results found.   Admission status: Inpatient: Based on patients clinical presentation and evaluation of above clinical data, I have made determination that patient meets Inpatient criteria at this time.  Time spent: 25 min  [facility] Pager [phone]. If 7PM-7AM, please  contact night-coverage at [email or web address], Office  [phone]  password [place]  02/21/2019, 12:01 PM  LOS: 4 days

## 2019-02-21 NOTE — Progress Notes (Signed)
[facility] Office:  [phone] General Surgery Progress Note   LOS: 4 days  POD -  2 Days Post-Op  Chief Complaint: Bowel obstruction  Assessment and Plan: 1.  LAPAROSCOPIC LYSIS OF ADHESIONS - 02/19/2019 - Gross  Having bowel function  Clears today, advance diet as tolerated  2.  DVT prophylaxis - Lovenox   Principal Problem:   SBO (small bowel obstruction) s/p lap adhesiolysis 02/19/2019 Active Problems:   GERD (gastroesophageal reflux disease)   Hyponatremia  Subjective:  Having BM's.  Feels better.  Has one son, 62 yo, who lives in [place].  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  Objective:   Vitals:   02/20/19 2158 02/21/19 0521  BP: (!) 145/81 137/78  Pulse: 67 62  Resp: 18 18  Temp: 98.9 F (37.2 C) 98.2 F (36.8 C)  SpO2: 97% 95%     Intake/Output from previous day:  08/08 0701 - 08/09 0700 In: 773.2 [P.O.:360; I.V.:413.2] Out: 400 [Urine:400]  Intake/Output this shift:  No intake/output data recorded.   Physical Exam:   General: WN older WF who is alert and oriented.   NGT in place.   HEENT: Normal. Pupils equal. .   Lungs: Clear   Abdomen: Mild distention.  Has BS.   Wound: Clean   Lab Results:    Recent Labs    02/19/19 0334 02/20/19 0954  WBC 6.8 7.5  HGB 13.3 11.0*  HCT 40.9 33.7*  PLT 290 226    BMET   Recent Labs    02/20/19 0954 02/21/19 0241  NA 139 137  K 3.6 3.4*  CL 104 104  CO2 25 25  GLUCOSE 83 103*  BUN 15 7*  CREATININE 0.67 0.57  CALCIUM 8.1* 7.9*    PT/INR   No results for input(s): LABPROT, INR in the last 72 hours.  ABG  No results for input(s): PHART, HCO3 in the last 72 hours.  Invalid input(s): PCO2, PO2   Studies/Results:  Vas [place] Upper Extremity Venous Duplex  Result Date: 02/19/2019 UPPER VENOUS STUDY  Indications: Swelling Risk Factors: None identified. Comparison Study: No prior studies. Performing Technologist: Oliver Hum RVT  Examination Guidelines: A  complete evaluation includes B-mode imaging, spectral Doppler, color Doppler, and power Doppler as needed of all accessible portions of each vessel. Bilateral testing is considered an integral part of a complete examination. Limited examinations for reoccurring indications may be performed as noted.  Right Findings: +----------+------------+---------+-----------+----------+-------+ RIGHT     CompressiblePhasicitySpontaneousPropertiesSummary +----------+------------+---------+-----------+----------+-------+ IJV           Full       Yes       Yes                      +----------+------------+---------+-----------+----------+-------+ Subclavian    Full       Yes       Yes                      +----------+------------+---------+-----------+----------+-------+ Axillary      Full       Yes       Yes                      +----------+------------+---------+-----------+----------+-------+ Brachial      Full       Yes       Yes                      +----------+------------+---------+-----------+----------+-------+  Radial        Full                                          +----------+------------+---------+-----------+----------+-------+ Ulnar         Full                                          +----------+------------+---------+-----------+----------+-------+ Cephalic      Full                                          +----------+------------+---------+-----------+----------+-------+ Basilic       Full                                          +----------+------------+---------+-----------+----------+-------+  Left Findings: +----------+------------+---------+-----------+----------+-------+ LEFT      CompressiblePhasicitySpontaneousPropertiesSummary +----------+------------+---------+-----------+----------+-------+ Subclavian    Full       Yes       Yes                      +----------+------------+---------+-----------+----------+-------+   Summary:  Right: No evidence of deep vein thrombosis in the upper extremity. No evidence of superficial vein thrombosis in the upper extremity.  Left: No evidence of thrombosis in the subclavian.  *See table(s) above for measurements and observations.    Preliminary      Anti-infectives:   Anti-infectives (From admission, onward)   Start     Dose/Rate Route Frequency Ordered Stop   02/19/19 1757  metroNIDAZOLE (FLAGYL) 5-0.79 MG/ML-% IVPB    Note to Pharmacy: Sandrea Matte   : cabinet override      02/19/19 1757 02/19/19 1815   02/19/19 1757  sodium chloride 0.9 % with cefTRIAXone (ROCEPHIN) ADS Med    Note to Pharmacy: Sandrea Matte   : cabinet override      02/19/19 1757 02/20/19 0559   02/17/19 2315  cefTRIAXone (ROCEPHIN) 1 g in sodium chloride 0.9 % 100 mL IVPB  Status:  Discontinued     1 g 200 mL/hr over 30 Minutes Intravenous Daily at bedtime 02/17/19 2311 06/04/61 4469     Shauni Henner C Antwoine Zorn, MD  [facility]

## 2019-02-22 LAB — BASIC METABOLIC PANEL
Anion gap: 7 (ref 5–15)
BUN: <5 mg/dL — ABNORMAL LOW (ref 8–23)
CO2: 24 mmol/L (ref 22–32)
Calcium: 8.2 mg/dL — ABNORMAL LOW (ref 8.9–10.3)
Chloride: 104 mmol/L (ref 98–111)
Creatinine, Ser: 0.54 mg/dL (ref 0.44–1.00)
GFR calc Af Amer: >60 mL/min (ref >60)
GFR calc non Af Amer: >60 mL/min (ref >60)
Glucose, Bld: 111 mg/dL — ABNORMAL HIGH (ref 70–99)
Potassium: 3.7 mmol/L (ref 3.5–5.1)
Sodium: 135 mmol/L (ref 135–145)

## 2019-02-22 MED ORDER — ENSURE ENLIVE PO LIQD
237.0000 mL | Freq: Two times a day (BID) | ORAL | Status: DC
  Administered 2019-02-22: 12:00:00 237 mL via ORAL

## 2019-02-22 MED ORDER — TRAMADOL HCL 50 MG PO TABS: 50.0000 mg | ORAL_TABLET | Freq: Four times a day (QID) | ORAL | Status: DC | PRN

## 2019-02-22 MED ORDER — METHOCARBAMOL 500 MG PO TABS: 500.0000 mg | ORAL_TABLET | Freq: Four times a day (QID) | ORAL | Status: DC | PRN

## 2019-02-22 MED ORDER — PANTOPRAZOLE SODIUM 40 MG PO TBEC
40.0000 mg | DELAYED_RELEASE_TABLET | Freq: Every day | ORAL | Status: DC
  Administered 2019-02-22: 40 mg via ORAL
  Filled 2019-02-22: qty 1

## 2019-02-22 MED ORDER — MORPHINE SULFATE (PF) 2 MG/ML IV SOLN
1.0000 mg | INTRAVENOUS | Status: DC | PRN
  Administered 2019-02-22: 1 mg via INTRAVENOUS
  Filled 2019-02-22: qty 1

## 2019-02-22 NOTE — Progress Notes (Signed)
[facility] Progress Note  3 Days Post-Op  Subjective: CC-  States that her abdomen is still a little sore from surgery, but denies pain. Not taking any pain medication. Denies bloating, nausea, or vomiting. She had 3-4 loose BMs yesterday and was passing flatus. Tolerating clear liquids. Feels hungry. Ambulated in the halls twice yesterday.  Objective: Vital signs in last 24 hours: Temp:  [98.4 F (36.9 C)-98.5 F (36.9 C)] 98.4 F (36.9 C) (08/10 0516) Pulse Rate:  [62-66] 66 (08/10 0516) Resp:  [14-18] 14 (08/10 0516) BP: (130-162)/(64-79) 130/64 (08/10 0516) SpO2:  [97 %-99 %] 97 % (08/10 0516) Weight:  [58.6 kg] 58.6 kg (08/10 0516) Last BM Date: 02/20/19  Intake/Output from previous day: 08/09 0701 - 08/10 0700 In: 480 [P.O.:480] Out: -  Intake/Output this shift: No intake/output data recorded.  PE: Gen:  Alert, NAD, pleasant HEENT: EOM's intact, pupils equal and round Pulm:  Rate and effort normal Abd: Soft, NT/ND, +BS, lap incisions cdi without erythema or drainage Skin: no rashes noted, warm and dry  Lab Results:  Recent Labs    02/20/19 0954  WBC 7.5  HGB 11.0*  HCT 33.7*  PLT 226   BMET Recent Labs    02/21/19 0241 02/22/19 0458  NA 137 135  K 3.4* 3.7  CL 104 104  CO2 25 24  GLUCOSE 103* 111*  BUN 7* <5*  CREATININE 0.57 0.54  CALCIUM 7.9* 8.2*   PT/INR No results for input(s): LABPROT, INR in the last 72 hours. CMP     Component Value Date/Time   NA 135 02/22/2019 0458   NA 132 (A) 09/18/2016   K 3.7 02/22/2019 0458   CL 104 02/22/2019 0458   CO2 24 02/22/2019 0458   GLUCOSE 111 (H) 02/22/2019 0458   BUN <5 (L) 02/22/2019 0458   BUN 17 09/18/2016   CREATININE 0.54 02/22/2019 0458   CALCIUM 8.2 (L) 02/22/2019 0458   PROT 5.9 (L) 02/20/2019 0954   ALBUMIN 3.2 (L) 02/20/2019 0954   AST 20 02/20/2019 0954   ALT 16 02/20/2019 0954   ALKPHOS 51 02/20/2019 0954   BILITOT 0.4 02/20/2019 0954   GFRNONAA >60 02/22/2019  0458   GFRAA >60 02/22/2019 0458   Lipase     Component Value Date/Time   LIPASE 33 02/17/2019 1721       Studies/Results: No results found.  Anti-infectives: Anti-infectives (From admission, onward)   Start     Dose/Rate Route Frequency Ordered Stop   02/19/19 1757  metroNIDAZOLE (FLAGYL) 5-0.79 MG/ML-% IVPB    Note to Pharmacy: Sandrea Matte   : cabinet override      02/19/19 1757 02/19/19 1815   02/19/19 1757  sodium chloride 0.9 % with cefTRIAXone (ROCEPHIN) ADS Med    Note to Pharmacy: Sandrea Matte   : cabinet override      02/19/19 1757 02/20/19 0559   02/17/19 2315  cefTRIAXone (ROCEPHIN) 1 g in sodium chloride 0.9 % 100 mL IVPB  Status:  Discontinued     1 g 200 mL/hr over 30 Minutes Intravenous Daily at bedtime 02/17/19 2311 02/20/19 0945       Assessment/Plan  SMALL BOWEL OBSTRUCTION S/p LAPAROSCOPIC LYSIS OF ADHESIONS 8/7 Dr. Johney Maine - POD#3 - tolerating clears and having bowel function - Advance to full liquids now, and if tolerating fulls ok to advance to soft diet for dinner. Add ensure. Continue mobilizing. If patient continues to tolerate diet and have bowel function she will likely be ready  for discharge tomorrow from surgical standpoint.  ID - none currently FEN - IVF, FLD, Ensure VTE - SCDs, lovenox Foley - none Follow up - Dr. Nicholaus Corolla - son [name] [phone]   LOS: 5 days    Wellington Hampshire , [facility] 02/22/2019, 8:54 AM Pager: [phone] Mon-Thurs 7:00 am-4:30 pm Fri 7:00 am -11:30 AM Sat-Sun 7:00 am-11:30 am

## 2019-02-22 NOTE — Care Management Important Message (Signed)
Important Message  Patient Details  Name: Ashley Howard MRN: [id number] Date of Birth: 11/03/1945   Medicare Important Message Given:  Yes. CMA printed out IM for the Case Manager Nurse or CSW to give to the patient.      Herby Amick 02/22/2019, 11:35 AM

## 2019-02-22 NOTE — Progress Notes (Signed)
[facility]  PROGRESS NOTE  Ashley Howard XLK:[id number] DOB: [birth year] DOA: 02/17/2019 PCP: Lynnell Catalan, FNP   Brief HPI:   73 year old female with a history of chronic back pain, GERD, history of SBO, hysterectomy, appendectomy came to the hospital with abdominal pain nausea after breakfast.  Also had 3 episodes of vomiting.  CT abdomen pelvis showed multiple markedly dilated fluid-filled loops of small bowel with abrupt transition to decompressed mid to distal small findings consistent with high-grade small bowel obstruction. NG tube was inserted  Subjective   Patient seen and examined, denies abdominal pain.  Tolerating diet well.  Had BM, passing flatus.   Assessment/Plan:    1. Small bowel obstruction -resolved, status post adhesion lysis laparoscopically by Dr. Britt Bolognese had multiple BMs yesterday.  Tolerated liquid diet, diet advanced as tolerated.    General surgery following.  2. GERD-continue Protonix  3. Right arm swelling-venous duplex of right upper extremity obtained, is negative for DVT.  4. Hyponatremia-likely from GI losses, resolved, today sodium is 139.  5. Hypokalemia-replete  6. UTI-patient has ab normal UA, was started on Rocephin 1 g IV daily.  Urine culture showed insignificant growth.  Agree with discontinuing Rocephin.   CBC: Recent Labs  Lab 02/17/19 1721 02/18/19 0403 02/19/19 0334 02/20/19 0954  WBC 15.3* 6.8 6.8 7.5  NEUTROABS 13.6*  --   --   --   HGB 14.8 12.2 13.3 11.0*  HCT 43.8 36.9 40.9 33.7*  MCV 98.0 97.4 100.0 101.2*  PLT 308 286 290 366    Basic Metabolic Panel: Recent Labs  Lab 02/18/19 0403 02/19/19 0334 02/20/19 0954 02/21/19 0241 02/22/19 0458  NA 132* 139 139 137 135  K 4.1 4.1 3.6 3.4* 3.7  CL 100 104 104 104 104  CO2 22 25 25 25 24   GLUCOSE 119* 128* 83 103* 111*  BUN 20 21 15  7* <5*  CREATININE 0.90 0.93 0.67 0.57 0.54  CALCIUM 8.7* 9.1 8.1* 7.9* 8.2*     DVT prophylaxis: Lovenox  Code  Status: Full code  Family Communication: No family at bedside  Disposition Plan: likely home when medically ready for discharge   Scheduled medications:  . bisacodyl  10 mg Rectal Daily  . Chlorhexidine Gluconate Cloth  6 each Topical Daily  . enoxaparin (LOVENOX) injection  40 mg Subcutaneous Q24H  . feeding supplement (ENSURE ENLIVE)  237 mL Oral BID BM  . lip balm  1 application Topical BID  . mupirocin ointment  1 application Nasal BID  . pantoprazole  40 mg Oral QHS    Consultants:  Surgery  Procedures:     Antibiotics:   Anti-infectives (From admission, onward)   Start     Dose/Rate Route Frequency Ordered Stop   02/19/19 1757  metroNIDAZOLE (FLAGYL) 5-0.79 MG/ML-% IVPB    Note to Pharmacy: Sandrea Matte   : cabinet override      02/19/19 1757 02/19/19 1815   02/19/19 1757  sodium chloride 0.9 % with cefTRIAXone (ROCEPHIN) ADS Med    Note to Pharmacy: Sandrea Matte   : cabinet override      02/19/19 1757 02/20/19 0559   02/17/19 2315  cefTRIAXone (ROCEPHIN) 1 g in sodium chloride 0.9 % 100 mL IVPB  Status:  Discontinued     1 g 200 mL/hr over 30 Minutes Intravenous Daily at bedtime 02/17/19 2311 02/20/19 0945       Objective   Vitals:   02/21/19 1427 02/21/19 2045 02/22/19 0516 02/22/19 1257  BP: Marland Kitchen)  162/79 (!) 151/79 130/64 (!) 142/78  Pulse: 62 66 66 76  Resp: 18 16 14 18   Temp: 98.5 F (36.9 C) 98.4 F (36.9 C) 98.4 F (36.9 C) (!) 97.5 F (36.4 C)  TempSrc: Oral Oral Oral Oral  SpO2: 99% 97% 97% 100%  Weight:   58.6 kg   Height:        Intake/Output Summary (Last 24 hours) at 02/22/2019 1404 Last data filed at 02/22/2019 0815 Gross per 24 hour  Intake 240 ml  Output -  Net 240 ml   Filed Weights   02/20/19 0550 02/21/19 0521 02/22/19 0516  Weight: 65.3 kg 60 kg 58.6 kg     Physical Examination:  General-appears in no acute distress Heart-S1-S2, regular, no murmur auscultated Lungs-clear to auscultation bilaterally, no wheezing  or crackles auscultated Abdomen-soft, nontender, no organomegaly Extremities-no edema in the lower extremities Neuro-alert, oriented x3, no focal deficit noted    Data Reviewed: I have personally reviewed following labs and imaging studies   Recent Results (from the past 240 hour(s))  SARS Coronavirus 2 [facility] order, Performed in [place] hospital lab) Nasopharyngeal Nasopharyngeal Swab     Status: None   Collection Time: 02/17/19  3:56 PM   Specimen: Nasopharyngeal Swab  Result Value Ref Range Status   SARS Coronavirus 2 NEGATIVE NEGATIVE Final    Comment: (NOTE) If result is NEGATIVE SARS-CoV-2 target nucleic acids are NOT DETECTED. The SARS-CoV-2 RNA is generally detectable in upper and lower  respiratory specimens during the acute phase of infection. The lowest  concentration of SARS-CoV-2 viral copies this assay can detect is 250  copies / mL. A negative result does not preclude SARS-CoV-2 infection  and should not be used as the sole basis for treatment or other  patient management decisions.  A negative result may occur with  improper specimen collection / handling, submission of specimen other  than nasopharyngeal swab, presence of viral mutation(s) within the  areas targeted by this assay, and inadequate number of viral copies  (<250 copies / mL). A negative result must be combined with clinical  observations, patient history, and epidemiological information. If result is POSITIVE SARS-CoV-2 target nucleic acids are DETECTED. The SARS-CoV-2 RNA is generally detectable in upper and lower  respiratory specimens dur ing the acute phase of infection.  Positive  results are indicative of active infection with SARS-CoV-2.  Clinical  correlation with patient history and other diagnostic information is  necessary to determine patient infection status.  Positive results do  not rule out bacterial infection or co-infection with other viruses. If result is PRESUMPTIVE  POSTIVE SARS-CoV-2 nucleic acids MAY BE PRESENT.   A presumptive positive result was obtained on the submitted specimen  and confirmed on repeat testing.  While 2019 novel coronavirus  (SARS-CoV-2) nucleic acids may be present in the submitted sample  additional confirmatory testing may be necessary for epidemiological  and / or clinical management purposes  to differentiate between  SARS-CoV-2 and other Sarbecovirus currently known to infect humans.  If clinically indicated additional testing with an alternate test  methodology [phone]) is advised. The SARS-CoV-2 RNA is generally  detectable in upper and lower respiratory sp ecimens during the acute  phase of infection. The expected result is Negative. Fact Sheet for Patients:  StrictlyIdeas.no Fact Sheet for Healthcare Providers: BankingDealers.co.za This test is not yet approved or cleared by the [place] FDA and has been authorized for detection and/or diagnosis of SARS-CoV-2 by FDA under an Emergency Use  Authorization (EUA).  This EUA will remain in effect (meaning this test can be used) for the duration of the COVID-19 declaration under Section 564(b)(1) of the Act, 21 U.S.C. section 360bbb-3(b)(1), unless the authorization is terminated or revoked sooner. Performed at [facility], [place] [address], [place], [place] [address]   MRSA PCR Screening     Status: None   Collection Time: 02/18/19 12:25 AM   Specimen: Nasal Mucosa; Nasopharyngeal  Result Value Ref Range Status   MRSA by PCR NEGATIVE NEGATIVE Final    Comment:        The GeneXpert MRSA Assay (FDA approved for NASAL specimens only), is one component of a comprehensive MRSA colonization surveillance program. It is not intended to diagnose MRSA infection nor to guide or monitor treatment for MRSA infections. Performed at [facility], [place] [address], [place], [place]  [address]   Culture, Urine     Status: Abnormal   Collection Time: 02/18/19  5:00 PM   Specimen: Urine, Random  Result Value Ref Range Status   Specimen Description   Final    URINE, RANDOM Performed at [place] [address] [place], [place] [address]    Special Requests   Final    NONE Performed at [facility], [place] [address] [place], [place] [address]    Culture (A)  Final    <10,000 COLONIES/mL INSIGNIFICANT GROWTH Performed at [place] [address] [place],  [address]    Report Status 02/19/2019 FINAL  Final  Surgical PCR screen     Status: Abnormal   Collection Time: 02/19/19  8:19 AM   Specimen: Nasal Mucosa; Nasal Swab  Result Value Ref Range Status   MRSA, PCR NEGATIVE NEGATIVE Final   Staphylococcus aureus POSITIVE (A) NEGATIVE Final    Comment: (NOTE) The Xpert SA Assay (FDA approved for NASAL specimens in patients 25 years of age and older), is one component of a comprehensive surveillance program. It is not intended to diagnose infection nor to guide or monitor treatment. Performed at [facility], [place] [address] [place],  [address]      Liver Function Tests: Recent Labs  Lab 02/17/19 1721 02/18/19 0403 02/20/19 0954  AST 25 21 20   ALT 17 16 16   ALKPHOS 75 60 51  BILITOT 0.6 0.9 0.4  PROT 7.5 6.2* 5.9*  ALBUMIN 4.3 3.5 3.2*   Recent Labs  Lab 02/17/19 1721  LIPASE 33   No results for input(s): AMMONIA in the last 168 hours.  Cardiac Enzymes: No results for input(s): CKTOTAL, CKMB, CKMBINDEX, TROPONINI in the last 168 hours. BNP (last 3 results) No results for input(s): BNP in the last 8760 hours.  ProBNP (last 3 results) No results for input(s): PROBNP in the last 8760 hours.    Studies: No results found.   Admission status: Inpatient: Based on patients clinical presentation and evaluation of above clinical data, I have made determination that  patient meets Inpatient criteria at this time.  Time spent: 25 min  [facility] Pager [phone]. If 7PM-7AM, please contact night-coverage at [email or web address], Office  [phone]  password [email or web address]  02/22/2019, 2:04 PM  LOS: 5 days

## 2019-02-23 DIAGNOSIS — Z23 Encounter for immunization: Secondary | ICD-10-CM | POA: Diagnosis not present

## 2019-02-23 MED ORDER — ENSURE ENLIVE PO LIQD: 237.0000 mL | Freq: Two times a day (BID) | ORAL | 12 refills | Status: DC

## 2019-02-23 MED ORDER — POLYETHYLENE GLYCOL 3350 17 G PO PACK: 17.0000 g | PACK | Freq: Every day | ORAL | 0 refills | Status: DC | PRN

## 2019-02-23 MED ORDER — DOCUSATE SODIUM 100 MG PO CAPS: 100.0000 mg | ORAL_CAPSULE | Freq: Every day | ORAL | 0 refills | Status: DC | PRN

## 2019-02-23 NOTE — Progress Notes (Signed)
I spoke to [name]  To give her report on Ashley Howard at [facility].

## 2019-02-23 NOTE — Progress Notes (Addendum)
[facility] was called for transportation. Confirmed with Ashley Howard, from [facility]. Transportation will pick pt up at 1115 AM. Pt was adamant with not going by EMS.

## 2019-02-23 NOTE — NC FL2 (Signed)
[place] LEVEL OF CARE SCREENING TOOL     IDENTIFICATION  Patient Name: Ashley Howard Birthdate: Jan 12, 1946 Sex: female Admission Date (Current Location): 02/17/2019  [facility] and [place] Number:  Herbalist and Address:  [facility],  [place] [address], [place]      Provider Number: [id number]  Attending Physician Name and Address:  Oswald Hillock, MD  Relative Name and Phone Number:       Current Level of Care: Hospital Recommended Level of Care: [place] Prior Approval Number:    Date Approved/Denied:   PASRR Number:    Discharge Plan: Other (Comment)(ALF)    Current Diagnoses: Patient Active Problem List   Diagnosis Date Noted  . Hyponatremia 12/30/2018  . T12 compression fracture ([place]) 12/30/2018  . HCAP (healthcare-associated pneumonia) 12/30/2018  . Anemia   . Rectal bleeding   . SBO (small bowel obstruction) s/p lap adhesiolysis 02/19/2019 08/21/2016  . Choledocholithiasis 08/21/2016  . Urinary incontinence 08/21/2016  . GERD (gastroesophageal reflux disease) 08/21/2016  . Neuropathic pain 08/21/2016  . Hyperglycemia 08/21/2016  . History of aspiration pneumonia 07/30/2016  . Septic shock ([place])   . Protein-calorie malnutrition, severe 07/20/2016  . Small bowel obstruction ([place]) 07/16/2016  . Dehydration 07/16/2016  . Abnormality of gait 01/04/2013    Orientation RESPIRATION BLADDER Height & Weight     Self, Time, Situation, Place  Normal Continent Weight: 59.6 kg Height:  4\' 11"  (149.9 cm)  BEHAVIORAL SYMPTOMS/MOOD NEUROLOGICAL BOWEL NUTRITION STATUS      Continent Diet(Regular)  AMBULATORY STATUS COMMUNICATION OF NEEDS Skin   Limited Assist(walk with walker)   Normal                       Personal Care Assistance Level of Assistance  Bathing, Feeding, Dressing Bathing Assistance: Independent Feeding assistance: Independent Dressing Assistance: Independent     Functional  Limitations Info             SPECIAL CARE FACTORS FREQUENCY                       Contractures Contractures Info: Not present    Additional Factors Info  Code Status Code Status Info: FULL             Current Medications (02/23/2019):  This is the current hospital active medication list Current Facility-Administered Medications  Medication Dose Route Frequency Provider Last Rate Last Dose  . acetaminophen (TYLENOL) suppository 650 mg  650 mg Rectal Q6H PRN Michael Boston, MD      . acetaminophen (TYLENOL) tablet 650 mg  650 mg Oral Q6H PRN Oswald Hillock, MD   650 mg at 02/20/19 1023  . bisacodyl (DULCOLAX) suppository 10 mg  10 mg Rectal Daily Michael Boston, MD   10 mg at 02/20/19 1023  . Chlorhexidine Gluconate Cloth 2 % PADS 6 each  6 each Topical Daily Oswald Hillock, MD   6 each at 02/22/19 1142  . dextrose 5 % and 0.45 % NaCl with KCl 20 mEq/L infusion   Intravenous Continuous Alphonsa Overall, MD 75 mL/hr at 02/23/19 [phone]    . enoxaparin (LOVENOX) injection 40 mg  40 mg Subcutaneous Q24H Michael Boston, MD   40 mg at 02/22/19 2156  . feeding supplement (ENSURE ENLIVE) (ENSURE ENLIVE) liquid 237 mL  237 mL Oral BID BM Meuth, Brooke A, PA-C   237 mL at 02/22/19 1142  . fentaNYL (  SUBLIMAZE) injection 25-50 mcg  25-50 mcg Intravenous Q1H PRN Oswald Hillock, MD      . lip balm (CARMEX) ointment 1 application  1 application Topical BID Michael Boston, MD   1 application at 97/41/63 0849  . LORazepam (ATIVAN) injection 0.5 mg  0.5 mg Intravenous Q6H PRN Michael Boston, MD      . magic mouthwash  15 mL Oral QID PRN Michael Boston, MD      . menthol-cetylpyridinium (CEPACOL) lozenge 3 mg  1 lozenge Oral PRN Michael Boston, MD      . methocarbamol (ROBAXIN) tablet 500 mg  500 mg Oral Q6H PRN Meuth, Brooke A, PA-C      . morphine 2 MG/ML injection 1 mg  1 mg Intravenous Q4H PRN Meuth, Brooke A, PA-C   1 mg at 02/22/19 1704  . mupirocin ointment (BACTROBAN) 2 % 1 application  1 application  Nasal BID Oswald Hillock, MD   1 application at 84/53/64 0848  . ondansetron (ZOFRAN) injection 4 mg  4 mg Intravenous Q6H PRN Michael Boston, MD       Or  . ondansetron (ZOFRAN) 8 mg in sodium chloride 0.9 % 50 mL IVPB  8 mg Intravenous Q6H PRN Michael Boston, MD      . ondansetron [facility]) injection 4 mg  4 mg Intravenous Q6H PRN Michael Boston, MD   4 mg at 02/19/19 0840  . pantoprazole (PROTONIX) EC tablet 40 mg  40 mg Oral QHS Meuth, Brooke A, PA-C   40 mg at 02/22/19 2156  . promethazine (PHENERGAN) injection 6.25 mg  6.25 mg Intravenous Q6H PRN Michael Boston, MD   6.25 mg at 02/18/19 1629  . traMADol (ULTRAM) tablet 50 mg  50 mg Oral Q6H PRN Meuth, Blaine Hamper, PA-C         Discharge Medications: Please see discharge summary for a list of discharge medications.  Relevant Imaging Results:  Relevant Lab Results:   Additional Information    Purcell Mouton, RN

## 2019-02-23 NOTE — Progress Notes (Signed)
Patient ambulated on hall way at this time via walker, tolerated well.  

## 2019-02-23 NOTE — Progress Notes (Signed)
4 Days Post-Op   Subjective/Chief Complaint: Tolerating po Having multiple loose BM's   Objective: Vital signs in last 24 hours: Temp:  [97.5 F (36.4 C)-98.4 F (36.9 C)] 98.4 F (36.9 C) (08/11 0525) Pulse Rate:  [71-76] 71 (08/11 0525) Resp:  [16-18] 16 (08/11 0525) BP: (142-156)/(78-89) 143/85 (08/11 0525) SpO2:  [97 %-100 %] 97 % (08/11 0525) Weight:  [59.6 kg] 59.6 kg (08/11 0525) Last BM Date: 02/23/19  Intake/Output from previous day: 08/10 0701 - 08/11 0700 In: 1071.3 [P.O.:240; I.V.:831.3] Out: 1650 [Urine:1650] Intake/Output this shift: No intake/output data recorded.  Exam: Awake and alert Abdomen soft, non-distended, incisions clean  Lab Results:  Recent Labs    02/20/19 0954  WBC 7.5  HGB 11.0*  HCT 33.7*  PLT 226   BMET Recent Labs    02/21/19 0241 02/22/19 0458  NA 137 135  K 3.4* 3.7  CL 104 104  CO2 25 24  GLUCOSE 103* 111*  BUN 7* <5*  CREATININE 0.57 0.54  CALCIUM 7.9* 8.2*   PT/INR No results for input(s): LABPROT, INR in the last 72 hours. ABG No results for input(s): PHART, HCO3 in the last 72 hours.  Invalid input(s): PCO2, PO2  Studies/Results: No results found.  Anti-infectives: Anti-infectives (From admission, onward)   Start     Dose/Rate Route Frequency Ordered Stop   02/19/19 1757  metroNIDAZOLE (FLAGYL) 5-0.79 MG/ML-% IVPB    Note to Pharmacy: Sandrea Matte   : cabinet override      02/19/19 1757 02/19/19 1815   02/19/19 1757  sodium chloride 0.9 % with cefTRIAXone (ROCEPHIN) ADS Med    Note to Pharmacy: Sandrea Matte   : cabinet override      02/19/19 1757 02/20/19 0559   02/17/19 2315  cefTRIAXone (ROCEPHIN) 1 g in sodium chloride 0.9 % 100 mL IVPB  Status:  Discontinued     1 g 200 mL/hr over 30 Minutes Intravenous Daily at bedtime 02/17/19 2311 02/20/19 0945      Assessment/Plan: s/p Procedure(s): LAPAROSCOPIC LYSIS OF ADHESIONS (N/A)  POD#4  Tolerating a diet Ready for d/c from a surgical  standpoint (pt is concerned about loose BM's but this may last several days   LOS: 6 days    Coralie Keens 02/23/2019

## 2019-02-23 NOTE — Discharge Summary (Signed)
Physician Discharge Summary  Ashley Howard OXB:[id number] DOB: [birth year] DOA: 02/17/2019  PCP: Lynnell Catalan, FNP  Admit date: 02/17/2019 Discharge date: 02/23/2019  Time spent: *40* minutes  Recommendations for Outpatient Follow-up:  1. Follow-up PCP in 2 weeks 2. Follow-up general surgery on 03/15/2019   Discharge Diagnoses:  Principal Problem:   SBO (small bowel obstruction) s/p lap adhesiolysis 02/19/2019 Active Problems:   GERD (gastroesophageal reflux disease)   Hyponatremia   Discharge Condition: Stable  Diet recommendation: Regular diet  Filed Weights   02/21/19 0521 02/22/19 0516 02/23/19 0525  Weight: 60 kg 58.6 kg 59.6 kg    History of present illness:  73 year old female with a history of chronic back pain, GERD, history of SBO, hysterectomy, appendectomy came to the hospital with abdominal pain nausea after breakfast.  Also had 3 episodes of vomiting.  CT abdomen pelvis showed multiple markedly dilated fluid-filled loops of small bowel with abrupt transition to decompressed mid to distal small findings consistent with high-grade small bowel obstruction. NG tube was inserted  Hospital Course:  1. Small bowel obstruction -resolved, status post adhesion lysis laparoscopically by Dr. Britt Bolognese had multiple BMs yesterday.  Tolerated liquid diet, diet advanced as tolerated.    General surgery has cleared the patient for discharge.  She will follow-up with general surgery as outpatient on 03/15/2019.  2. GERD-continue PPI  3. Right arm swelling-venous duplex of right upper extremity obtained, is negative for DVT.  4. Hyponatremia-likely from GI losses, resolved, today sodium is 139.  5. Hypokalemia-replete  6. UTI-patient has ab normal UA, was started on Rocephin 1 g IV daily.  Urine culture showed insignificant growth.    Rocephin was discontinued.  7. Loose stools-patient complains of loose stools, general surgery says that she is going to have some loose  stools over the next few days.  Have changed medications MiraLAX and Colace to as needed for constipation. *  Procedures:  Laparoscopic adhesion lysis for SBO  Consultations:  General surgery  Discharge Exam: Vitals:   02/22/19 2120 02/23/19 0525  BP: (!) 156/89 (!) 143/85  Pulse: 73 71  Resp: 16 16  Temp: 98.4 F (36.9 C) 98.4 F (36.9 C)  SpO2: 100% 97%    General: Appears in no acute distress Cardiovascular: S1-S2, regular Respiratory: Clear to auscultation bilaterally Abdomen-soft, nontender, no organomegaly  Discharge Instructions   Discharge Instructions    Diet - low sodium heart healthy   Complete by: As directed    Increase activity slowly   Complete by: As directed      Allergies as of 02/23/2019      Reactions   Tape Other (See Comments)   Paper tape - blisters      Medication List    STOP taking these medications   Konsyl Daily Fiber 28.3 % Powd Generic drug: Psyllium     TAKE these medications   acetaminophen 500 MG tablet Commonly known as: TYLENOL Take 500 mg by mouth every 8 (eight) hours as needed for moderate pain.   alum & mag hydroxide-simeth 200-200-20 MG/5ML suspension Commonly known as: MAALOX/MYLANTA Take 30 mLs by mouth every 6 (six) hours as needed for indigestion or heartburn.   Calcium 500 + D3 500-600 MG-UNIT Tabs Generic drug: Calcium Carb-Cholecalciferol Take 1 tablet by mouth 2 (two) times daily. (0800 & 2000)   docusate sodium 100 MG capsule Commonly known as: COLACE Take 1 capsule (100 mg total) by mouth daily as needed for mild constipation. (0800) What changed:   when  to take this  reasons to take this   feeding supplement (ENSURE ENLIVE) Liqd Take 237 mLs by mouth 2 (two) times daily between meals.   gabapentin 100 MG capsule Commonly known as: NEURONTIN Take 100 mg by mouth at bedtime. (2000)   loratadine 10 MG tablet Commonly known as: CLARITIN Take 10 mg by mouth daily.   LORazepam 0.5 MG  tablet Commonly known as: ATIVAN Take 1 tablet (0.5 mg total) by mouth every 6 (six) hours as needed for anxiety. Take 0.5 mg by mouth at bedtime. May also take twice daily as needed for anxiety. What changed: when to take this   meloxicam 15 MG tablet Commonly known as: MOBIC Take 15 mg by mouth daily. (0800)   multivitamin with minerals Tabs tablet Take 1 tablet by mouth daily. (0800)   Myrbetriq 25 MG Tb24 tablet Generic drug: mirabegron ER Take 25 mg by mouth daily. 0800   omeprazole 20 MG capsule Commonly known as: PRILOSEC Take 20 mg by mouth daily. (0800)   ondansetron 4 MG tablet Commonly known as: Zofran Take 1 tablet (4 mg total) by mouth daily as needed for nausea or vomiting.   polyethylene glycol 17 g packet Commonly known as: MIRALAX / GLYCOLAX Take 17 g by mouth daily as needed for moderate constipation. What changed:   when to take this  reasons to take this   risedronate 35 MG tablet Commonly known as: ACTONEL Take 35 mg by mouth every Wednesday. Mix with 8 oz of water. Remain upright and no food/drink for 30 min. Do not crush   simethicone 125 MG chewable tablet Commonly known as: MYLICON Chew 852 mg by mouth 2 (two) times daily. (0800 & 1800)      Allergies  Allergen Reactions  . Tape Other (See Comments)    Paper tape - blisters   Follow-up Information    Michael Boston, MD. Go on 03/15/2019.   Specialty: General Surgery Why: Your appointment is 03/15/19 3:00 pm Please arrive 15 minutes prior to your appointment to check in and fill out paperwork. Bring photo ID and insurance information. Contact information: [address] [place] [address] [phone]            The results of significant diagnostics from this hospitalization (including imaging, microbiology, ancillary and laboratory) are listed below for reference.    Significant Diagnostic Studies: Ct Abdomen Pelvis W Contrast  Result Date: 02/17/2019 CLINICAL  DATA:  Abdominal pain with nausea EXAM: CT ABDOMEN AND PELVIS WITH CONTRAST TECHNIQUE: Multidetector CT imaging of the abdomen and pelvis was performed using the standard protocol following bolus administration of intravenous contrast. CONTRAST:  142mL OMNIPAQUE IOHEXOL 300 MG/ML  SOLN COMPARISON:  01/08/2019 radiograph, CT 12/30/2018 FINDINGS: Lower chest: Lung bases demonstrate no consolidation or pleural effusion. The heart size is within normal limits. Large hiatal hernia. Hepatobiliary: Status post cholecystectomy. No focal hepatic abnormality. Stable mild intra and moderate extrahepatic biliary dilatation. Pancreas: Unremarkable. No pancreatic ductal dilatation or surrounding inflammatory changes. Spleen: Normal in size without focal abnormality. Adrenals/Urinary Tract: Adrenal glands are unremarkable. Kidneys are normal, without renal calculi, focal lesion, or hydronephrosis. Bladder is unremarkable. Stomach/Bowel: Mild fluid-filled stomach. Multiple dilated loops of fluid-filled small bowel, measuring up to 7 cm in the left lower quadrant with abrupt transition to decompressed distal small bowel and colon consistent with mechanical small bowel obstruction, high-grade. Transition point within the mid pelvis, slightly to the right of midline, series 2, image number 50. No bowel  wall thickening. History of appendectomy. Descending and sigmoid colon diverticular disease without acute inflammatory change. Vascular/Lymphatic: Moderate aortic atherosclerosis. No aneurysm. No significantly enlarged lymph nodes. Reproductive: Status post hysterectomy. No adnexal masses. Other: Negative for free air or free fluid Musculoskeletal: Scoliosis of the spine. Chronic compression deformity T12. Mild chronic superior endplate deformity at L2. Grade 1 anterolisthesis L3 on L4 and L4 on L5. Diffuse degenerative changes. IMPRESSION: 1. Multiple markedly dilated fluid-filled loops of small bowel with abrupt transition to  decompressed mid to distal small, findings are consistent with high-grade small bowel obstruction. Transition point visualized within the mid pelvis, slightly to the right of midline. Negative for free air 2. Status post cholecystectomy. Stable intra and extrahepatic biliary dilatation 3. Descending and sigmoid colon diverticular disease without acute inflammatory change Electronically Signed   By: Donavan Foil M.D.   On: 02/17/2019 19:26   Dg Abd Portable 1v-small Bowel Obstruction Protocol-24 Hr Delay  Result Date: 02/19/2019 CLINICAL DATA:  Small-bowel obstruction. EXAM: PORTABLE ABDOMEN - 1 VIEW COMPARISON:  02/18/2019. FINDINGS: Surgical clips right upper quadrant. NG tube noted in stable position. Multiple loops of dilated small bowel are again noted. Oral contrast noted in the colon. No free air noted. Contrast in the bladder. Surgical clips right upper quadrant. Degenerative changes scoliosis thoracic spine. IMPRESSION: NG tube in stable position. Persistent distended loops of small bowel again noted consistent small bowel obstruction. No significant change from prior exam. Oral contrast noted in the colon. Electronically Signed   By: Marcello Moores  Register   On: 02/19/2019 07:50   Dg Abd Portable 1v-small Bowel Obstruction Protocol-initial, 8 Hr Delay  Result Date: 02/18/2019 CLINICAL DATA:  Small bowel obstruction EXAM: PORTABLE ABDOMEN - 1 VIEW COMPARISON:  02/18/2019 FINDINGS: NG tube tip is in the distal stomach. Dilated small bowel loops within the abdomen and pelvis compatible with small bowel obstruction. No change. Prior cholecystectomy. No organomegaly or free air. IMPRESSION: Stable high-grade small bowel obstruction pattern. Electronically Signed   By: Rolm Baptise M.D.   On: 02/18/2019 19:23   Dg Abd Portable 1v  Result Date: 02/18/2019 CLINICAL DATA:  Nasogastric tube placement EXAM: PORTABLE ABDOMEN - 1 VIEW COMPARISON:  Same-day radiograph FINDINGS: Nasogastric tube is in place with  distal tip and side hole projecting below the diaphragm within the stomach. Hiatal hernia is noted. Multiple markedly dilated loops of small bowel ago at throughout the abdomen compatible with a small-bowel obstruction. No gross free intraperitoneal air. IMPRESSION: Nasogastric tube in satisfactory positioning. Electronically Signed   By: Davina Poke M.D.   On: 02/18/2019 11:07   Dg Abd Portable 1v  Result Date: 02/18/2019 CLINICAL DATA:  NG tube placement EXAM: PORTABLE ABDOMEN - 1 VIEW COMPARISON:  CT 02/17/2019 FINDINGS: NG tube is in place with the tip in the proximal stomach. The side port is near the GE junction. Dilated small bowel loops again noted compatible with small bowel obstruction. IMPRESSION: NG tube tip in the proximal stomach. Electronically Signed   By: Rolm Baptise M.D.   On: 02/18/2019 02:59   Dg Abd Portable 1 View  Result Date: 02/17/2019 CLINICAL DATA:  Replacement of NG tube EXAM: PORTABLE ABDOMEN - 1 VIEW COMPARISON:  02/17/2019, CT 02/17/2019 FINDINGS: Esophageal tube side port at the level of hiatal hernia in the distal mediastinum with tip at the level of the diaphragmatic hiatus. Dilated small bowel consistent with obstruction. Contrast within the renal collecting systems IMPRESSION: Esophageal tube tip is partially coiled over the distal mediastinum within  the patient's hiatal hernia, the tip is at the level of diaphragmatic hiatus. Further advancement and repositioning is recommended. Electronically Signed   By: Donavan Foil M.D.   On: 02/17/2019 21:45   Dg Abd Portable 1 View  Result Date: 02/17/2019 CLINICAL DATA:  Check NG placement EXAM: PORTABLE ABDOMEN - 1 VIEW COMPARISON:  Film from earlier in the same day. FINDINGS: Nasogastric catheter is again identified now coiled within the distal esophagus with the tip at the level of the aortic knob. This should be removed completely and readvanced. Cardiac shadow is within normal limits. Contrast from recent CT is noted.  IMPRESSION: Nasogastric catheter coiled within the esophagus as described. This should be removed and readvanced. Electronically Signed   By: Inez Catalina M.D.   On: 02/17/2019 21:38   Dg Abd Portable 1 View  Result Date: 02/17/2019 CLINICAL DATA:  NG tube placement. Small bowel obstruction. EXAM: PORTABLE ABDOMEN - 1 VIEW COMPARISON:  CT scan dated 02/17/2019 FINDINGS: The NG tube tip is at the level of the diaphragm. The patient has a known large hiatal hernia with narrowing of the gastric lumen at the level of the diaphragmatic hiatus as demonstrated on the prior CT scan. Persistent dilated small bowel loops. Contrast seen in the kidneys and bladder from recent CT scan. Thoracolumbar scoliosis. IMPRESSION: NG tube tip at the level of the diaphragm likely in the hiatal hernia above the diaphragmatic hiatus. Persistent small bowel obstruction. Critical Value/emergent results were called by telephone at the time of interpretation on 02/17/2019 at 9:02 pm to Dr. Gilford Raid, who verbally acknowledged these results. Electronically Signed   By: Lorriane Shire M.D.   On: 02/17/2019 21:04   Vas [place] Upper Extremity Venous Duplex  Result Date: 02/21/2019 UPPER VENOUS STUDY  Indications: Swelling Risk Factors: None identified. Comparison Study: No prior studies. Performing Technologist: Oliver Hum RVT  Examination Guidelines: A complete evaluation includes B-mode imaging, spectral Doppler, color Doppler, and power Doppler as needed of all accessible portions of each vessel. Bilateral testing is considered an integral part of a complete examination. Limited examinations for reoccurring indications may be performed as noted.  Right Findings: +----------+------------+---------+-----------+----------+-------+ RIGHT     CompressiblePhasicitySpontaneousPropertiesSummary +----------+------------+---------+-----------+----------+-------+ IJV           Full       Yes       Yes                       +----------+------------+---------+-----------+----------+-------+ Subclavian    Full       Yes       Yes                      +----------+------------+---------+-----------+----------+-------+ Axillary      Full       Yes       Yes                      +----------+------------+---------+-----------+----------+-------+ Brachial      Full       Yes       Yes                      +----------+------------+---------+-----------+----------+-------+ Radial        Full                                          +----------+------------+---------+-----------+----------+-------+  Ulnar         Full                                          +----------+------------+---------+-----------+----------+-------+ Cephalic      Full                                          +----------+------------+---------+-----------+----------+-------+ Basilic       Full                                          +----------+------------+---------+-----------+----------+-------+  Left Findings: +----------+------------+---------+-----------+----------+-------+ LEFT      CompressiblePhasicitySpontaneousPropertiesSummary +----------+------------+---------+-----------+----------+-------+ Subclavian    Full       Yes       Yes                      +----------+------------+---------+-----------+----------+-------+  Summary:  Right: No evidence of deep vein thrombosis in the upper extremity. No evidence of superficial vein thrombosis in the upper extremity.  Left: No evidence of thrombosis in the subclavian.  *See table(s) above for measurements and observations.  Diagnosing physician: Harold Barban MD Electronically signed by Harold Barban MD on 02/21/2019 at 12:34:49 PM.    Final     Microbiology: Recent Results (from the past 240 hour(s))  SARS Coronavirus 2 [facility] order, Performed in [facility] hospital lab) Nasopharyngeal Nasopharyngeal Swab     Status: None   Collection Time:  02/17/19  3:56 PM   Specimen: Nasopharyngeal Swab  Result Value Ref Range Status   SARS Coronavirus 2 NEGATIVE NEGATIVE Final    Comment: (NOTE) If result is NEGATIVE SARS-CoV-2 target nucleic acids are NOT DETECTED. The SARS-CoV-2 RNA is generally detectable in upper and lower  respiratory specimens during the acute phase of infection. The lowest  concentration of SARS-CoV-2 viral copies this assay can detect is 250  copies / mL. A negative result does not preclude SARS-CoV-2 infection  and should not be used as the sole basis for treatment or other  patient management decisions.  A negative result may occur with  improper specimen collection / handling, submission of specimen other  than nasopharyngeal swab, presence of viral mutation(s) within the  areas targeted by this assay, and inadequate number of viral copies  (<250 copies / mL). A negative result must be combined with clinical  observations, patient history, and epidemiological information. If result is POSITIVE SARS-CoV-2 target nucleic acids are DETECTED. The SARS-CoV-2 RNA is generally detectable in upper and lower  respiratory specimens dur ing the acute phase of infection.  Positive  results are indicative of active infection with SARS-CoV-2.  Clinical  correlation with patient history and other diagnostic information is  necessary to determine patient infection status.  Positive results do  not rule out bacterial infection or co-infection with other viruses. If result is PRESUMPTIVE POSTIVE SARS-CoV-2 nucleic acids MAY BE PRESENT.   A presumptive positive result was obtained on the submitted specimen  and confirmed on repeat testing.  While 2019 novel coronavirus  (SARS-CoV-2) nucleic acids may be present in the submitted sample  additional confirmatory testing may be necessary for epidemiological  and / or clinical management  purposes  to differentiate between  SARS-CoV-2 and other Sarbecovirus currently known to  infect humans.  If clinically indicated additional testing with an alternate test  methodology [phone]) is advised. The SARS-CoV-2 RNA is generally  detectable in upper and lower respiratory sp ecimens during the acute  phase of infection. The expected result is Negative. Fact Sheet for Patients:  StrictlyIdeas.no Fact Sheet for Healthcare Providers: BankingDealers.co.za This test is not yet approved or cleared by the [place] FDA and has been authorized for detection and/or diagnosis of SARS-CoV-2 by FDA under an Emergency Use Authorization (EUA).  This EUA will remain in effect (meaning this test can be used) for the duration of the COVID-19 declaration under Section 564(b)(1) of the Act, 21 U.S.C. section 360bbb-3(b)(1), unless the authorization is terminated or revoked sooner. Performed at [facility], [place] [address] [place], [place] [address]   MRSA PCR Screening     Status: None   Collection Time: 02/18/19 12:25 AM   Specimen: Nasal Mucosa; Nasopharyngeal  Result Value Ref Range Status   MRSA by PCR NEGATIVE NEGATIVE Final    Comment:        The GeneXpert MRSA Assay (FDA approved for NASAL specimens only), is one component of a comprehensive MRSA colonization surveillance program. It is not intended to diagnose MRSA infection nor to guide or monitor treatment for MRSA infections. Performed at [facility], [place] [address] [place], [place] [address]   Culture, Urine     Status: Abnormal   Collection Time: 02/18/19  5:00 PM   Specimen: Urine, Random  Result Value Ref Range Status   Specimen Description   Final    URINE, RANDOM Performed at [place] [address], [place], [place] [address]    Special Requests   Final    NONE Performed at [facility], [place] [address] [place], [place] [address]    Culture (A)  Final    <10,000  COLONIES/mL INSIGNIFICANT GROWTH Performed at [place] [address] [place], [place] [address]    Report Status 02/19/2019 FINAL  Final  Surgical PCR screen     Status: Abnormal   Collection Time: 02/19/19  8:19 AM   Specimen: Nasal Mucosa; Nasal Swab  Result Value Ref Range Status   MRSA, PCR NEGATIVE NEGATIVE Final   Staphylococcus aureus POSITIVE (A) NEGATIVE Final    Comment: (NOTE) The Xpert SA Assay (FDA approved for NASAL specimens in patients 83 years of age and older), is one component of a comprehensive surveillance program. It is not intended to diagnose infection nor to guide or monitor treatment. Performed at [facility], [place] [address], [place], [place] [address]      Labs: Basic Metabolic Panel: Recent Labs  Lab 02/18/19 0403 02/19/19 0334 02/20/19 0954 02/21/19 0241 02/22/19 0458  NA 132* 139 139 137 135  K 4.1 4.1 3.6 3.4* 3.7  CL 100 104 104 104 104  CO2 22 25 25 25 24   GLUCOSE 119* 128* 83 103* 111*  BUN 20 21 15  7* <5*  CREATININE 0.90 0.93 0.67 0.57 0.54  CALCIUM 8.7* 9.1 8.1* 7.9* 8.2*   Liver Function Tests: Recent Labs  Lab 02/17/19 1721 02/18/19 0403 02/20/19 0954  AST 25 21 20   ALT 17 16 16   ALKPHOS 75 60 51  BILITOT 0.6 0.9 0.4  PROT 7.5 6.2* 5.9*  ALBUMIN 4.3 3.5 3.2*   Recent Labs  Lab 02/17/19 1721  LIPASE 33   No results for input(s): AMMONIA in the last 168 hours. CBC: Recent Labs  Lab 02/17/19 1721 02/18/19 0403 02/19/19 0334 02/20/19 0954  WBC 15.3* 6.8 6.8 7.5  NEUTROABS 13.6*  --   --   --   HGB 14.8 12.2 13.3 11.0*  HCT 43.8 36.9 40.9 33.7*  MCV 98.0 97.4 100.0 101.2*  PLT 308 286 290 226   Cardiac Enzymes:     Signed:  Oswald Hillock MD.  [facility] 02/23/2019, 9:47 AM

## 2019-03-01 DIAGNOSIS — M1990 Unspecified osteoarthritis, unspecified site: Secondary | ICD-10-CM | POA: Diagnosis not present

## 2019-03-08 DIAGNOSIS — M1990 Unspecified osteoarthritis, unspecified site: Secondary | ICD-10-CM | POA: Diagnosis not present

## 2019-03-08 DIAGNOSIS — J302 Other seasonal allergic rhinitis: Secondary | ICD-10-CM | POA: Diagnosis not present

## 2019-03-08 DIAGNOSIS — G9009 Other idiopathic peripheral autonomic neuropathy: Secondary | ICD-10-CM | POA: Diagnosis not present

## 2019-03-10 DIAGNOSIS — Z03818 Encounter for observation for suspected exposure to other biological agents ruled out: Secondary | ICD-10-CM | POA: Diagnosis not present

## 2019-03-17 DIAGNOSIS — Z79899 Other long term (current) drug therapy: Secondary | ICD-10-CM | POA: Diagnosis not present

## 2019-03-25 DIAGNOSIS — Z03818 Encounter for observation for suspected exposure to other biological agents ruled out: Secondary | ICD-10-CM | POA: Diagnosis not present

## 2019-03-29 DIAGNOSIS — G9009 Other idiopathic peripheral autonomic neuropathy: Secondary | ICD-10-CM | POA: Diagnosis not present

## 2019-04-05 DIAGNOSIS — N3281 Overactive bladder: Secondary | ICD-10-CM | POA: Diagnosis not present

## 2019-04-05 DIAGNOSIS — K219 Gastro-esophageal reflux disease without esophagitis: Secondary | ICD-10-CM | POA: Diagnosis not present

## 2019-04-05 DIAGNOSIS — R143 Flatulence: Secondary | ICD-10-CM | POA: Diagnosis not present

## 2019-04-22 DIAGNOSIS — Z03818 Encounter for observation for suspected exposure to other biological agents ruled out: Secondary | ICD-10-CM | POA: Diagnosis not present

## 2019-04-26 DIAGNOSIS — G9009 Other idiopathic peripheral autonomic neuropathy: Secondary | ICD-10-CM | POA: Diagnosis not present

## 2019-05-03 DIAGNOSIS — K5904 Chronic idiopathic constipation: Secondary | ICD-10-CM | POA: Diagnosis not present

## 2019-05-03 DIAGNOSIS — J302 Other seasonal allergic rhinitis: Secondary | ICD-10-CM | POA: Diagnosis not present

## 2019-05-03 DIAGNOSIS — G9009 Other idiopathic peripheral autonomic neuropathy: Secondary | ICD-10-CM | POA: Diagnosis not present

## 2019-05-06 DIAGNOSIS — Z03818 Encounter for observation for suspected exposure to other biological agents ruled out: Secondary | ICD-10-CM | POA: Diagnosis not present

## 2019-05-24 DIAGNOSIS — G894 Chronic pain syndrome: Secondary | ICD-10-CM | POA: Diagnosis not present

## 2019-05-24 DIAGNOSIS — G9009 Other idiopathic peripheral autonomic neuropathy: Secondary | ICD-10-CM | POA: Diagnosis not present

## 2019-06-08 DIAGNOSIS — Q845 Enlarged and hypertrophic nails: Secondary | ICD-10-CM | POA: Diagnosis not present

## 2019-06-08 DIAGNOSIS — B351 Tinea unguium: Secondary | ICD-10-CM | POA: Diagnosis not present

## 2019-06-08 DIAGNOSIS — I739 Peripheral vascular disease, unspecified: Secondary | ICD-10-CM | POA: Diagnosis not present

## 2019-06-28 DIAGNOSIS — G9009 Other idiopathic peripheral autonomic neuropathy: Secondary | ICD-10-CM | POA: Diagnosis not present

## 2019-06-28 DIAGNOSIS — M1991 Primary osteoarthritis, unspecified site: Secondary | ICD-10-CM | POA: Diagnosis not present

## 2019-07-05 DIAGNOSIS — K219 Gastro-esophageal reflux disease without esophagitis: Secondary | ICD-10-CM | POA: Diagnosis not present

## 2019-07-05 DIAGNOSIS — J302 Other seasonal allergic rhinitis: Secondary | ICD-10-CM | POA: Diagnosis not present

## 2019-07-05 DIAGNOSIS — K5904 Chronic idiopathic constipation: Secondary | ICD-10-CM | POA: Diagnosis not present

## 2019-07-26 DIAGNOSIS — G9009 Other idiopathic peripheral autonomic neuropathy: Secondary | ICD-10-CM | POA: Diagnosis not present

## 2019-07-26 DIAGNOSIS — M1991 Primary osteoarthritis, unspecified site: Secondary | ICD-10-CM | POA: Diagnosis not present

## 2019-07-29 DIAGNOSIS — Z79899 Other long term (current) drug therapy: Secondary | ICD-10-CM | POA: Diagnosis not present

## 2019-08-02 DIAGNOSIS — R143 Flatulence: Secondary | ICD-10-CM | POA: Diagnosis not present

## 2019-08-02 DIAGNOSIS — G9009 Other idiopathic peripheral autonomic neuropathy: Secondary | ICD-10-CM | POA: Diagnosis not present

## 2019-08-02 DIAGNOSIS — N3281 Overactive bladder: Secondary | ICD-10-CM | POA: Diagnosis not present

## 2019-08-30 DIAGNOSIS — K649 Unspecified hemorrhoids: Secondary | ICD-10-CM | POA: Diagnosis not present

## 2019-08-30 DIAGNOSIS — K219 Gastro-esophageal reflux disease without esophagitis: Secondary | ICD-10-CM | POA: Diagnosis not present

## 2019-08-30 DIAGNOSIS — J302 Other seasonal allergic rhinitis: Secondary | ICD-10-CM | POA: Diagnosis not present

## 2019-09-20 DIAGNOSIS — G9009 Other idiopathic peripheral autonomic neuropathy: Secondary | ICD-10-CM | POA: Diagnosis not present

## 2019-09-20 DIAGNOSIS — M1991 Primary osteoarthritis, unspecified site: Secondary | ICD-10-CM | POA: Diagnosis not present

## 2019-09-27 DIAGNOSIS — K5904 Chronic idiopathic constipation: Secondary | ICD-10-CM | POA: Diagnosis not present

## 2019-09-27 DIAGNOSIS — G9009 Other idiopathic peripheral autonomic neuropathy: Secondary | ICD-10-CM | POA: Diagnosis not present

## 2019-09-27 DIAGNOSIS — N3281 Overactive bladder: Secondary | ICD-10-CM | POA: Diagnosis not present

## 2020-06-18 IMAGING — DX PORTABLE ABDOMEN - 1 VIEW
1 series · 1 of 1 positions shown · non-contrast
Comparison: 02/17/2019, CT 02/17/2019

CLINICAL DATA: Replacement of NG tube

EXAM:
PORTABLE ABDOMEN - 1 VIEW

[abdomen kub]
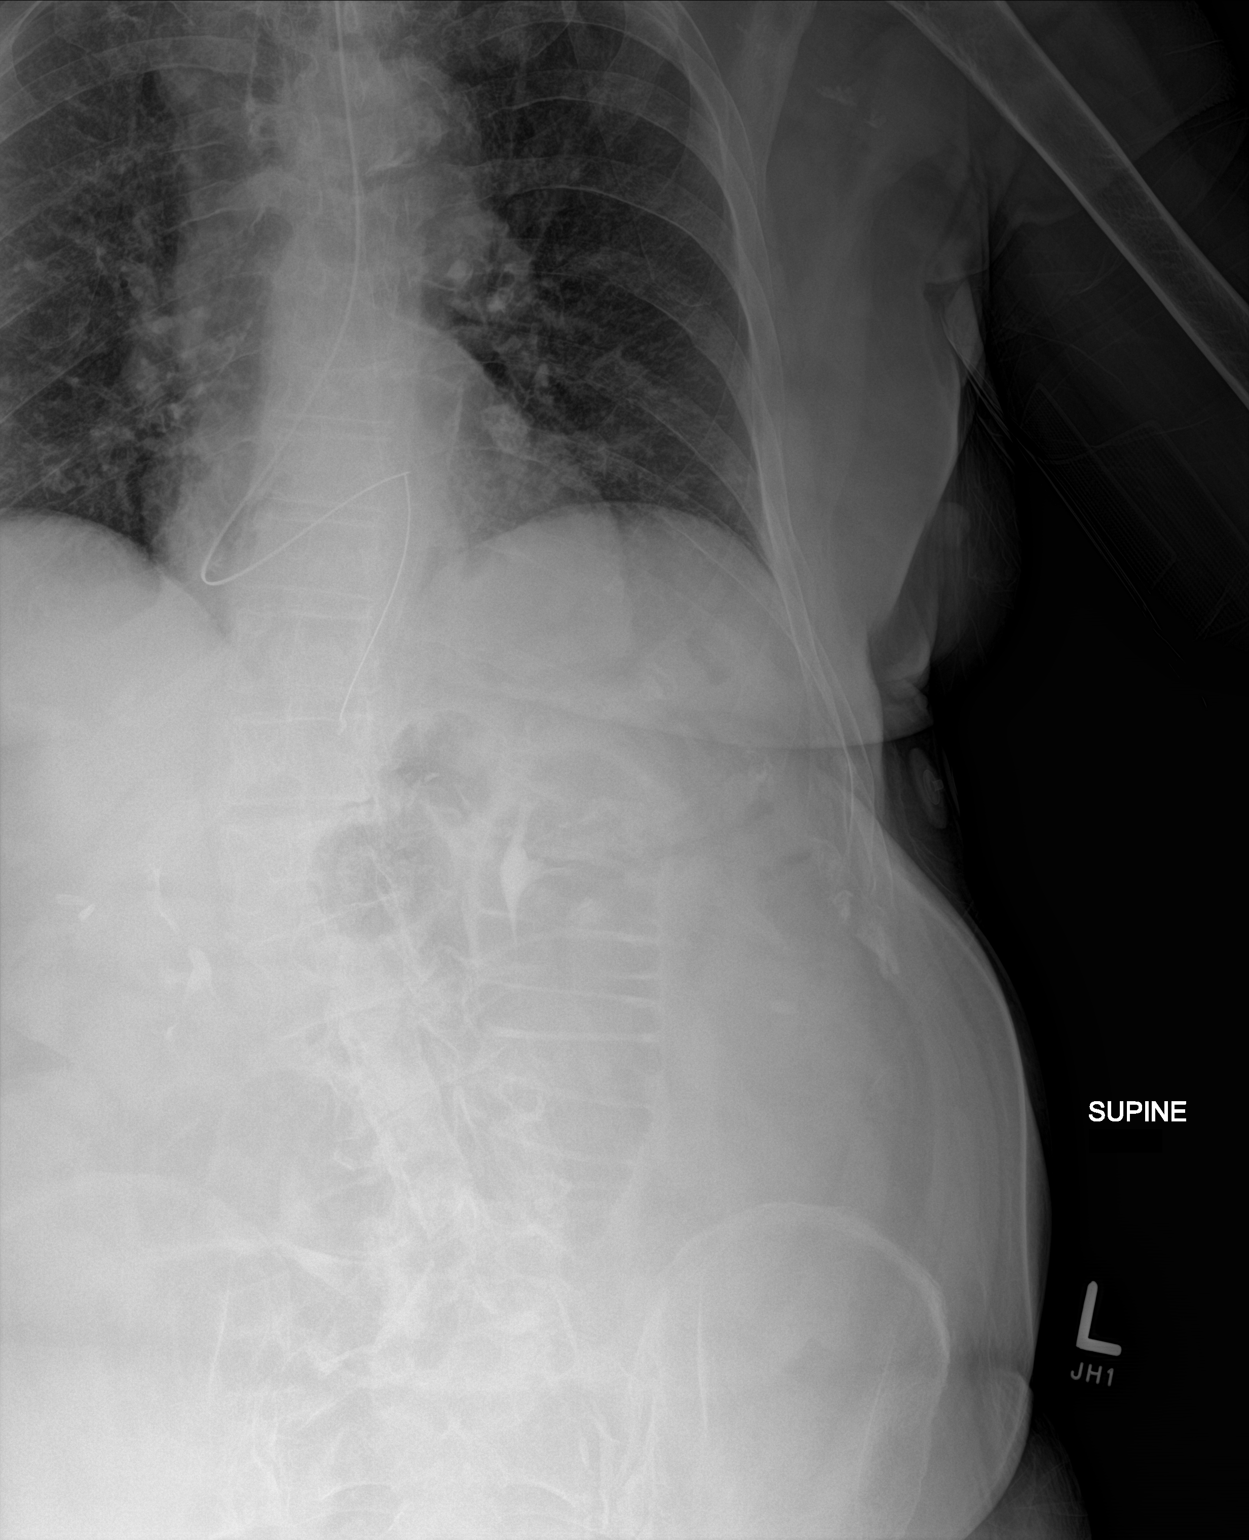

[1 of 1 positions shown; findings below may reference images not displayed]

FINDINGS: Esophageal tube side port at the level of hiatal hernia in the
distal mediastinum with tip at the level of the diaphragmatic
hiatus. Dilated small bowel consistent with obstruction. Contrast
within the renal collecting systems
IMPRESSION: Esophageal tube tip is partially coiled over the distal mediastinum
within the patient's hiatal hernia, the tip is at the level of
diaphragmatic hiatus. Further advancement and repositioning is
recommended.

## 2020-06-19 IMAGING — DX PORTABLE ABDOMEN - 1 VIEW
1 series · 1 of 1 positions shown · non-contrast
Comparison: 02/18/2019

CLINICAL DATA: Small bowel obstruction

EXAM:
PORTABLE ABDOMEN - 1 VIEW

[abdomen kub]
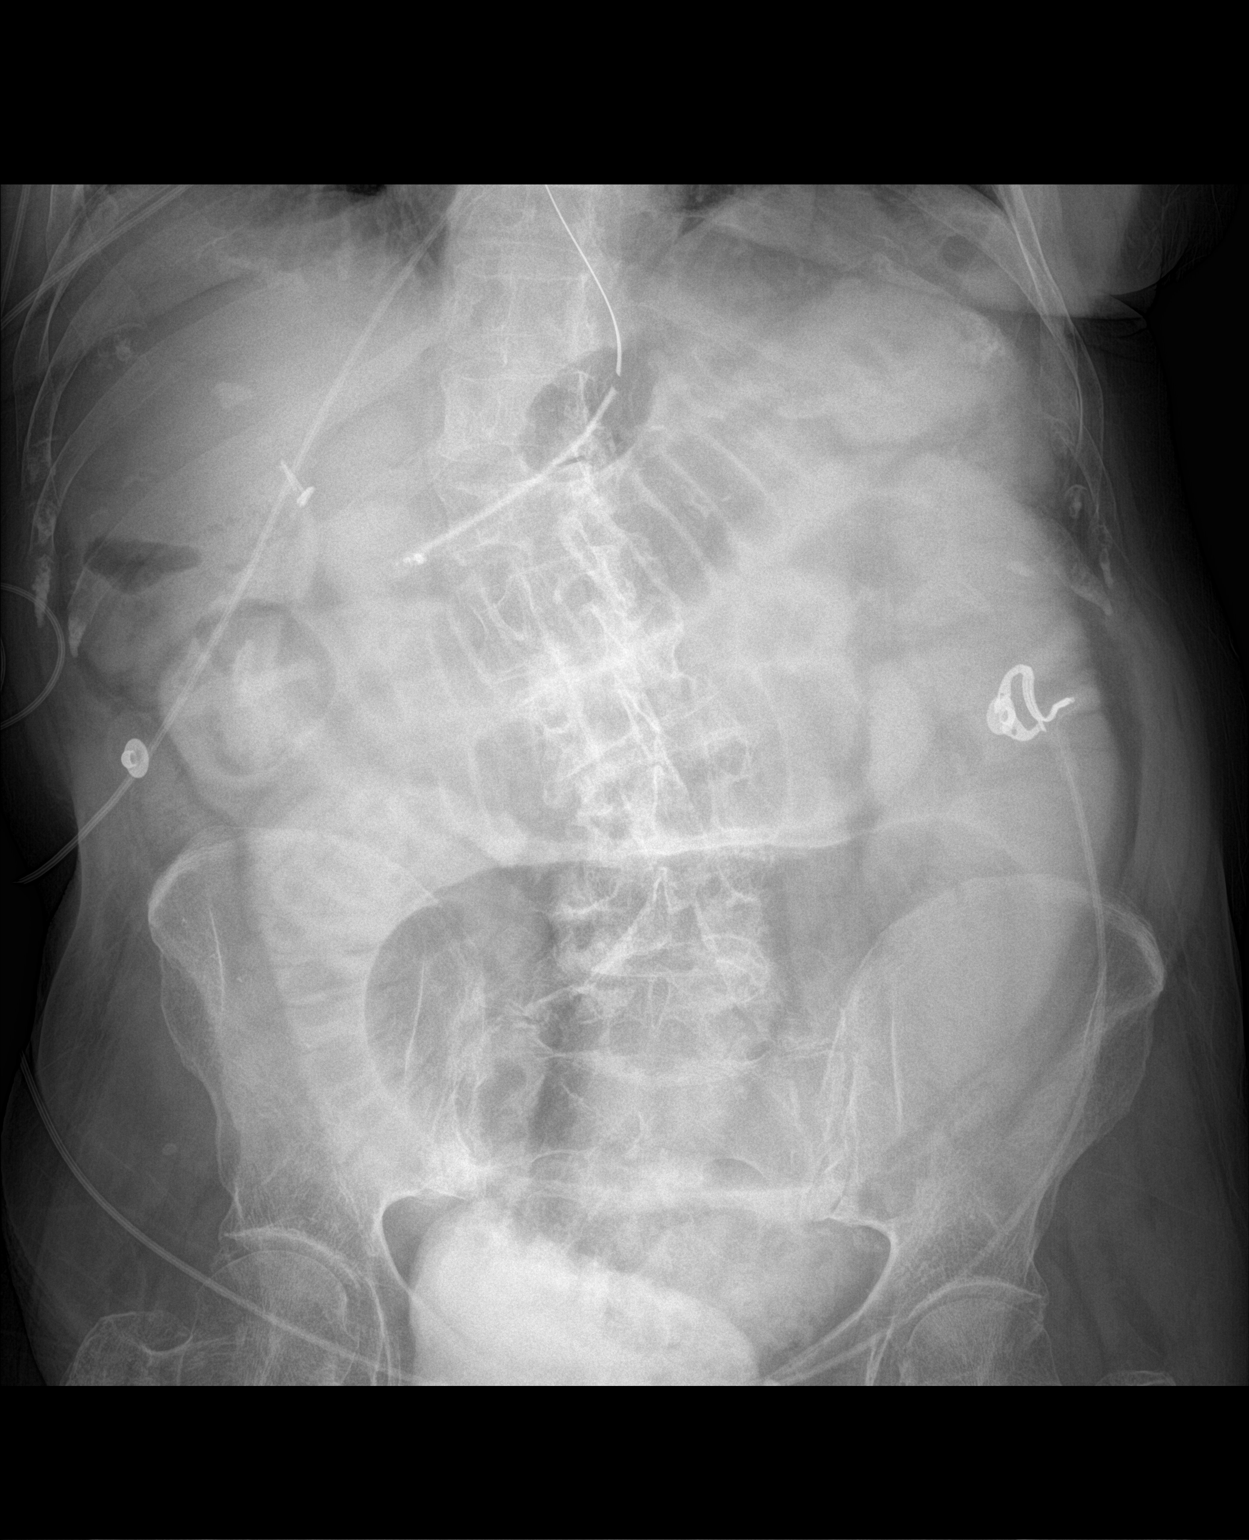

[1 of 1 positions shown; findings below may reference images not displayed]

FINDINGS: NG tube tip is in the distal stomach. Dilated small bowel loops
within the abdomen and pelvis compatible with small bowel
obstruction. No change. Prior cholecystectomy. No organomegaly or
free air.
IMPRESSION: Stable high-grade small bowel obstruction pattern.

## 2020-06-19 IMAGING — DX PORTABLE ABDOMEN - 1 VIEW
1 series · 1 of 1 positions shown · non-contrast
Comparison: CT 02/17/2019

CLINICAL DATA: NG tube placement

EXAM:
PORTABLE ABDOMEN - 1 VIEW

[abdomen kub]
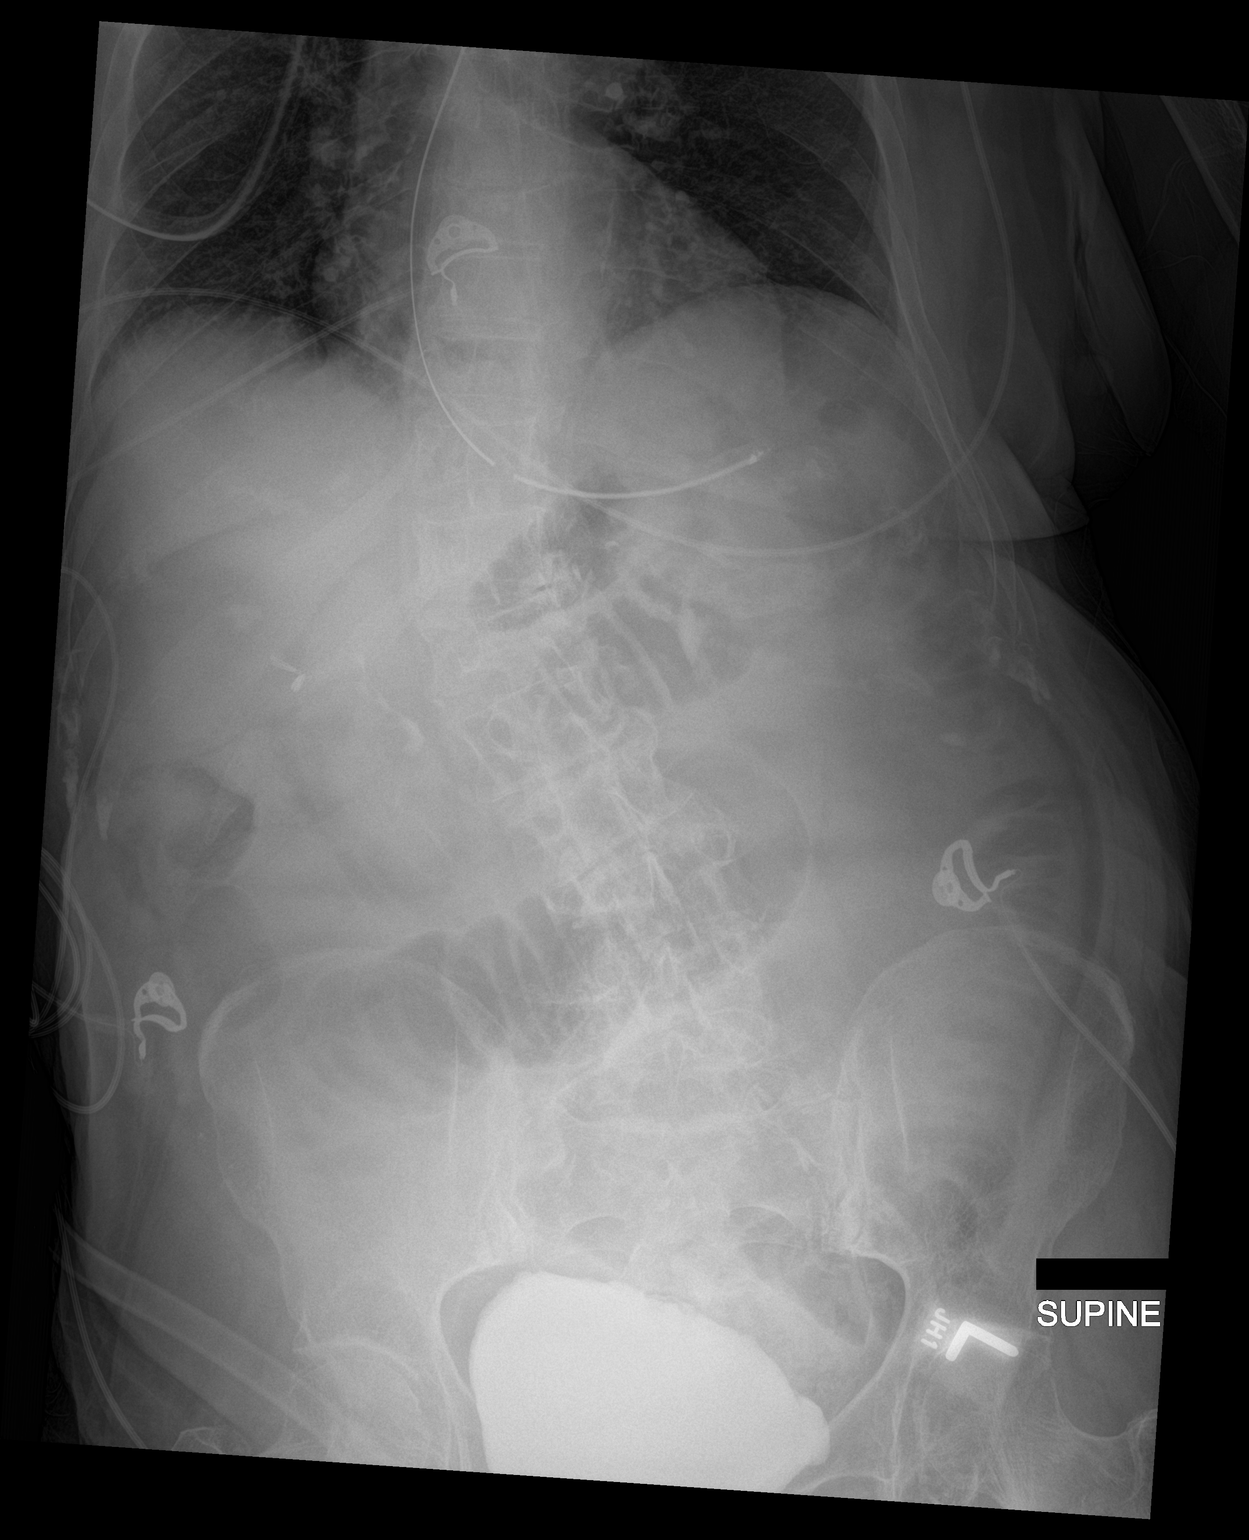

[1 of 1 positions shown; findings below may reference images not displayed]

FINDINGS: NG tube is in place with the tip in the proximal stomach. The side
port is near the GE junction. Dilated small bowel loops again noted
compatible with small bowel obstruction.
IMPRESSION: NG tube tip in the proximal stomach.

## 2020-12-12 DIAGNOSIS — I739 Peripheral vascular disease, unspecified: Secondary | ICD-10-CM | POA: Diagnosis not present

## 2020-12-12 DIAGNOSIS — G894 Chronic pain syndrome: Secondary | ICD-10-CM | POA: Diagnosis not present

## 2020-12-12 DIAGNOSIS — K5900 Constipation, unspecified: Secondary | ICD-10-CM | POA: Diagnosis not present

## 2020-12-18 DIAGNOSIS — F064 Anxiety disorder due to known physiological condition: Secondary | ICD-10-CM | POA: Diagnosis not present

## 2020-12-18 DIAGNOSIS — F331 Major depressive disorder, recurrent, moderate: Secondary | ICD-10-CM | POA: Diagnosis not present

## 2021-02-28 ENCOUNTER — Other Ambulatory Visit: Payer: Self-pay | Admitting: Internal Medicine

## 2021-02-28 DIAGNOSIS — Z0000 Encounter for general adult medical examination without abnormal findings: Secondary | ICD-10-CM | POA: Diagnosis not present

## 2021-02-28 DIAGNOSIS — R030 Elevated blood-pressure reading, without diagnosis of hypertension: Secondary | ICD-10-CM | POA: Diagnosis not present

## 2021-02-28 DIAGNOSIS — J302 Other seasonal allergic rhinitis: Secondary | ICD-10-CM | POA: Diagnosis not present

## 2021-02-28 DIAGNOSIS — M5136 Other intervertebral disc degeneration, lumbar region: Secondary | ICD-10-CM | POA: Diagnosis not present

## 2021-02-28 DIAGNOSIS — K219 Gastro-esophageal reflux disease without esophagitis: Secondary | ICD-10-CM | POA: Diagnosis not present

## 2021-02-28 DIAGNOSIS — Z13220 Encounter for screening for lipoid disorders: Secondary | ICD-10-CM | POA: Diagnosis not present

## 2021-02-28 DIAGNOSIS — F411 Generalized anxiety disorder: Secondary | ICD-10-CM | POA: Diagnosis not present

## 2021-02-28 DIAGNOSIS — Z131 Encounter for screening for diabetes mellitus: Secondary | ICD-10-CM | POA: Diagnosis not present

## 2021-02-28 DIAGNOSIS — K5904 Chronic idiopathic constipation: Secondary | ICD-10-CM | POA: Diagnosis not present

## 2021-03-01 LAB — CBC
HCT: 40.6 % (ref 35.0–45.0)
Hemoglobin: 14 g/dL (ref 11.7–15.5)
MCH: 33.4 pg — ABNORMAL HIGH (ref 27.0–33.0)
MCHC: 34.5 g/dL (ref 32.0–36.0)
MCV: 96.9 fL (ref 80.0–100.0)
MPV: 9.8 fL (ref 7.5–12.5)
Platelets: 253 10*3/uL (ref 140–400)
RBC: 4.19 10*6/uL (ref 3.80–5.10)
RDW: 12.1 % (ref 11.0–15.0)
WBC: 9.1 10*3/uL (ref 3.8–10.8)

## 2021-03-01 LAB — COMPLETE METABOLIC PANEL WITH GFR
AG Ratio: 1.7 (calc) (ref 1.0–2.5)
ALT: 11 U/L (ref 6–29)
AST: 16 U/L (ref 10–35)
Albumin: 4.8 g/dL (ref 3.6–5.1)
Alkaline phosphatase (APISO): 98 U/L (ref 37–153)
BUN: 9 mg/dL (ref 7–25)
CO2: 27 mmol/L (ref 20–32)
Calcium: 10.1 mg/dL (ref 8.6–10.4)
Chloride: 95 mmol/L — ABNORMAL LOW (ref 98–110)
Creat: 0.73 mg/dL (ref 0.60–1.00)
Globulin: 2.8 g/dL (calc) (ref 1.9–3.7)
Glucose, Bld: 86 mg/dL (ref 65–99)
Potassium: 3.8 mmol/L (ref 3.5–5.3)
Sodium: 135 mmol/L (ref 135–146)
Total Bilirubin: 0.6 mg/dL (ref 0.2–1.2)
Total Protein: 7.6 g/dL (ref 6.1–8.1)
eGFR: 85 mL/min/{1.73_m2} (ref >=60)

## 2021-03-01 LAB — LIPID PANEL
Cholesterol: 330 mg/dL — ABNORMAL HIGH (ref <200)
HDL: 90 mg/dL (ref >=50)
LDL Cholesterol (Calc): 217 mg/dL (calc) — ABNORMAL HIGH
Non-HDL Cholesterol (Calc): 240 mg/dL (calc) — ABNORMAL HIGH (ref <130)
Total CHOL/HDL Ratio: 3.7 (calc) (ref <5.0)
Triglycerides: 98 mg/dL (ref <150)

## 2021-03-01 LAB — TSH: TSH: 2.77 mIU/L (ref 0.40–4.50)

## 2022-10-25 ENCOUNTER — Encounter: Payer: Self-pay | Admitting: Internal Medicine

## 2022-10-25 ENCOUNTER — Ambulatory Visit (INDEPENDENT_AMBULATORY_CARE_PROVIDER_SITE_OTHER): Payer: Medicare Other | Admitting: Internal Medicine

## 2022-10-25 VITALS — BP 100/80 | HR 103 | Temp 98.7°F | Ht <= 58 in | Wt 98.0 lb

## 2022-10-25 DIAGNOSIS — K219 Gastro-esophageal reflux disease without esophagitis: Secondary | ICD-10-CM

## 2022-10-25 DIAGNOSIS — K5909 Other constipation: Secondary | ICD-10-CM | POA: Diagnosis not present

## 2022-10-25 DIAGNOSIS — M810 Age-related osteoporosis without current pathological fracture: Secondary | ICD-10-CM

## 2022-10-25 DIAGNOSIS — M1990 Unspecified osteoarthritis, unspecified site: Secondary | ICD-10-CM | POA: Insufficient documentation

## 2022-10-25 DIAGNOSIS — R269 Unspecified abnormalities of gait and mobility: Secondary | ICD-10-CM | POA: Diagnosis not present

## 2022-10-25 DIAGNOSIS — R739 Hyperglycemia, unspecified: Secondary | ICD-10-CM | POA: Diagnosis not present

## 2022-10-25 LAB — LIPID PANEL
Cholesterol: 201 mg/dL — ABNORMAL HIGH (ref 0–200)
HDL: 69 mg/dL (ref >39.00)
LDL Cholesterol: 114 mg/dL — ABNORMAL HIGH (ref 0–99)
NonHDL: 132.23
Total CHOL/HDL Ratio: 3
Triglycerides: 89 mg/dL (ref 0.0–149.0)
VLDL: 17.8 mg/dL (ref 0.0–40.0)

## 2022-10-25 LAB — COMPREHENSIVE METABOLIC PANEL
ALT: 7 U/L (ref 0–35)
AST: 15 U/L (ref 0–37)
Albumin: 4 g/dL (ref 3.5–5.2)
Alkaline Phosphatase: 146 U/L — ABNORMAL HIGH (ref 39–117)
BUN: 8 mg/dL (ref 6–23)
CO2: 29 mEq/L (ref 19–32)
Calcium: 10.2 mg/dL (ref 8.4–10.5)
Chloride: 90 mEq/L — ABNORMAL LOW (ref 96–112)
Creatinine, Ser: 0.58 mg/dL (ref 0.40–1.20)
GFR: 86.46 mL/min (ref >60.00)
Glucose, Bld: 100 mg/dL — ABNORMAL HIGH (ref 70–99)
Potassium: 3.9 mEq/L (ref 3.5–5.1)
Sodium: 129 mEq/L — ABNORMAL LOW (ref 135–145)
Total Bilirubin: 0.5 mg/dL (ref 0.2–1.2)
Total Protein: 8.1 g/dL (ref 6.0–8.3)

## 2022-10-25 LAB — CBC
HCT: 32.6 % — ABNORMAL LOW (ref 36.0–46.0)
Hemoglobin: 11.1 g/dL — ABNORMAL LOW (ref 12.0–15.0)
MCHC: 34.1 g/dL (ref 30.0–36.0)
MCV: 93 fl (ref 78.0–100.0)
Platelets: 519 10*3/uL — ABNORMAL HIGH (ref 150.0–400.0)
RBC: 3.51 Mil/uL — ABNORMAL LOW (ref 3.87–5.11)
RDW: 13.3 % (ref 11.5–15.5)
WBC: 10.5 10*3/uL (ref 4.0–10.5)

## 2022-10-25 LAB — HEMOGLOBIN A1C: Hgb A1c MFr Bld: 6.1 % (ref 4.6–6.5)

## 2022-10-25 MED ORDER — LINACLOTIDE 290 MCG PO CAPS: 290.0000 ug | ORAL_CAPSULE | Freq: Every day | ORAL | 3 refills | Status: AC

## 2022-10-25 MED ORDER — OMEPRAZOLE 20 MG PO CPDR: 20.0000 mg | DELAYED_RELEASE_CAPSULE | Freq: Every day | ORAL | 3 refills | Status: AC

## 2022-10-25 MED ORDER — AMOXICILLIN-POT CLAVULANATE 875-125 MG PO TABS: 1.0000 | ORAL_TABLET | Freq: Two times a day (BID) | ORAL | 0 refills | Status: AC

## 2022-10-25 NOTE — Assessment & Plan Note (Signed)
Gait in unsteady and needed assistance in the office. She is not using cane or walker and asked her if she had this at home to use she will check.

## 2022-10-25 NOTE — Assessment & Plan Note (Signed)
Rx linzess 290 mcg daily. She had previously tried 145 with not great results wants to try higher dose. Has tried many otc products including colace, miralax, metamucil without relief.

## 2022-10-25 NOTE — Assessment & Plan Note (Signed)
With unclear treatment history and unclear last DEXA. Prior compression fracture makes clinical diagnosis. I see actonel on an old medication list but she states was too expensive and unclear if she ever took. Once records received likely needs treatment to prevent future fracture.

## 2022-10-25 NOTE — Assessment & Plan Note (Signed)
Sugars elevated on prior labs. Checking HgA1c and CMP and CBC.

## 2022-10-25 NOTE — Assessment & Plan Note (Signed)
Multiple joints including knee, hip, shoulder. She is using tylenol for pain. Checking CMP to ensure no overuse.

## 2022-10-25 NOTE — Assessment & Plan Note (Signed)
Taking omeprazole 20 mg daily and controlled. Rx done today.

## 2022-10-25 NOTE — Progress Notes (Signed)
   Subjective:   Patient ID: Ashley Howard, female    DOB: 29-Mar-1946, 77 y.o.   MRN: [id number]  HPI The patient is a new 77 YO female. See A/P for new and chronic issues.  PMH, [facility], social history reviewed and updated  Review of Systems  Constitutional:  Positive for activity change and appetite change. Negative for fatigue, fever and unexpected weight change.  HENT:  Positive for congestion, hearing loss, postnasal drip, rhinorrhea and sinus pressure. Negative for ear discharge, ear pain, sinus pain, sneezing, sore throat, tinnitus, trouble swallowing and voice change.   Eyes: Negative.   Respiratory:  Negative for cough, chest tightness, shortness of breath and wheezing.   Cardiovascular: Negative.  Negative for chest pain, palpitations and leg swelling.  Gastrointestinal:  Positive for constipation. Negative for abdominal distention, abdominal pain, diarrhea, nausea and vomiting.  Musculoskeletal:  Positive for arthralgias, back pain and myalgias.  Skin: Negative.   Neurological: Negative.   Psychiatric/Behavioral: Negative.      Objective:  Physical Exam Constitutional:      Appearance: She is well-developed.     Comments: thin  HENT:     Head: Normocephalic and atraumatic.     Comments: Moderately hard of hearing Cardiovascular:     Rate and Rhythm: Normal rate and regular rhythm.  Pulmonary:     Effort: Pulmonary effort is normal. No respiratory distress.     Breath sounds: Normal breath sounds. No wheezing or rales.  Abdominal:     General: Bowel sounds are normal. There is no distension.     Palpations: Abdomen is soft.     Tenderness: There is no abdominal tenderness. There is no rebound.  Musculoskeletal:     Cervical back: Normal range of motion.  Skin:    General: Skin is warm and dry.  Neurological:     Mental Status: She is alert and oriented to person, place, and time.     Coordination: Coordination abnormal.     Vitals:   10/25/22 0956  BP: 100/80   Pulse: (!) 103  Temp: 98.7 F (37.1 C)  TempSrc: Oral  SpO2: 94%  Weight: 98 lb (44.5 kg)  Height: 4' 9.5" (1.461 m)    Assessment & Plan:

## 2022-10-25 NOTE — Patient Instructions (Signed)
We will check the labs today.   We have sent in linzess high dose to take 1 pill daily for constipation.  We have sent in augmentin 1 pill twice a day for 1 week to help the sinus infection. We have sent in cetirizine to take 1 pill daily for allergies.

## 2022-10-28 ENCOUNTER — Other Ambulatory Visit: Payer: Self-pay | Admitting: Internal Medicine

## 2022-10-28 DIAGNOSIS — E871 Hypo-osmolality and hyponatremia: Secondary | ICD-10-CM

## 2022-11-17 ENCOUNTER — Emergency Department (HOSPITAL_COMMUNITY): Payer: Medicare Other

## 2022-11-17 ENCOUNTER — Encounter (HOSPITAL_COMMUNITY): Payer: Self-pay | Admitting: *Deleted

## 2022-11-17 ENCOUNTER — Inpatient Hospital Stay (HOSPITAL_COMMUNITY)
Admission: EM | Admit: 2022-11-17 | Discharge: 2022-11-20 | DRG: 988 | Disposition: A | Discharge status: Hospice | Payer: Medicare Other | Attending: Internal Medicine | Admitting: Internal Medicine

## 2022-11-17 ENCOUNTER — Other Ambulatory Visit: Payer: Self-pay

## 2022-11-17 DIAGNOSIS — R52 Pain, unspecified: Secondary | ICD-10-CM | POA: Diagnosis not present

## 2022-11-17 DIAGNOSIS — K5909 Other constipation: Secondary | ICD-10-CM | POA: Diagnosis not present

## 2022-11-17 DIAGNOSIS — Z8249 Family history of ischemic heart disease and other diseases of the circulatory system: Secondary | ICD-10-CM | POA: Diagnosis not present

## 2022-11-17 DIAGNOSIS — E876 Hypokalemia: Secondary | ICD-10-CM | POA: Diagnosis not present

## 2022-11-17 DIAGNOSIS — M899 Disorder of bone, unspecified: Secondary | ICD-10-CM | POA: Diagnosis present

## 2022-11-17 DIAGNOSIS — I700 Atherosclerosis of aorta: Secondary | ICD-10-CM | POA: Diagnosis present

## 2022-11-17 DIAGNOSIS — M810 Age-related osteoporosis without current pathological fracture: Secondary | ICD-10-CM | POA: Diagnosis not present

## 2022-11-17 DIAGNOSIS — N281 Cyst of kidney, acquired: Secondary | ICD-10-CM | POA: Diagnosis not present

## 2022-11-17 DIAGNOSIS — K219 Gastro-esophageal reflux disease without esophagitis: Secondary | ICD-10-CM | POA: Diagnosis not present

## 2022-11-17 DIAGNOSIS — C7951 Secondary malignant neoplasm of bone: Secondary | ICD-10-CM | POA: Diagnosis not present

## 2022-11-17 DIAGNOSIS — K5730 Diverticulosis of large intestine without perforation or abscess without bleeding: Secondary | ICD-10-CM | POA: Diagnosis not present

## 2022-11-17 DIAGNOSIS — Z743 Need for continuous supervision: Secondary | ICD-10-CM | POA: Diagnosis not present

## 2022-11-17 DIAGNOSIS — C801 Malignant (primary) neoplasm, unspecified: Secondary | ICD-10-CM | POA: Diagnosis not present

## 2022-11-17 DIAGNOSIS — C787 Secondary malignant neoplasm of liver and intrahepatic bile duct: Secondary | ICD-10-CM | POA: Diagnosis present

## 2022-11-17 DIAGNOSIS — S42001A Fracture of unspecified part of right clavicle, initial encounter for closed fracture: Secondary | ICD-10-CM | POA: Diagnosis not present

## 2022-11-17 DIAGNOSIS — D75839 Thrombocytosis, unspecified: Secondary | ICD-10-CM | POA: Diagnosis not present

## 2022-11-17 DIAGNOSIS — E222 Syndrome of inappropriate secretion of antidiuretic hormone: Secondary | ICD-10-CM | POA: Diagnosis present

## 2022-11-17 DIAGNOSIS — J910 Malignant pleural effusion: Secondary | ICD-10-CM | POA: Diagnosis present

## 2022-11-17 DIAGNOSIS — Z515 Encounter for palliative care: Secondary | ICD-10-CM | POA: Diagnosis not present

## 2022-11-17 DIAGNOSIS — Z6822 Body mass index (BMI) 22.0-22.9, adult: Secondary | ICD-10-CM

## 2022-11-17 DIAGNOSIS — Z87891 Personal history of nicotine dependence: Secondary | ICD-10-CM | POA: Diagnosis not present

## 2022-11-17 DIAGNOSIS — K5904 Chronic idiopathic constipation: Secondary | ICD-10-CM

## 2022-11-17 DIAGNOSIS — Z89422 Acquired absence of other left toe(s): Secondary | ICD-10-CM

## 2022-11-17 DIAGNOSIS — R400 Somnolence: Secondary | ICD-10-CM | POA: Diagnosis not present

## 2022-11-17 DIAGNOSIS — W1830XA Fall on same level, unspecified, initial encounter: Secondary | ICD-10-CM | POA: Diagnosis present

## 2022-11-17 DIAGNOSIS — Z91048 Other nonmedicinal substance allergy status: Secondary | ICD-10-CM

## 2022-11-17 DIAGNOSIS — J4489 Other specified chronic obstructive pulmonary disease: Secondary | ICD-10-CM | POA: Diagnosis present

## 2022-11-17 DIAGNOSIS — Z66 Do not resuscitate: Secondary | ICD-10-CM | POA: Diagnosis not present

## 2022-11-17 DIAGNOSIS — J439 Emphysema, unspecified: Secondary | ICD-10-CM | POA: Diagnosis present

## 2022-11-17 DIAGNOSIS — C3411 Malignant neoplasm of upper lobe, right bronchus or lung: Principal | ICD-10-CM | POA: Diagnosis present

## 2022-11-17 DIAGNOSIS — D649 Anemia, unspecified: Secondary | ICD-10-CM | POA: Diagnosis not present

## 2022-11-17 DIAGNOSIS — Z7189 Other specified counseling: Secondary | ICD-10-CM | POA: Diagnosis not present

## 2022-11-17 DIAGNOSIS — F419 Anxiety disorder, unspecified: Secondary | ICD-10-CM | POA: Diagnosis present

## 2022-11-17 DIAGNOSIS — Z9842 Cataract extraction status, left eye: Secondary | ICD-10-CM

## 2022-11-17 DIAGNOSIS — M419 Scoliosis, unspecified: Secondary | ICD-10-CM | POA: Diagnosis present

## 2022-11-17 DIAGNOSIS — E440 Moderate protein-calorie malnutrition: Secondary | ICD-10-CM | POA: Diagnosis not present

## 2022-11-17 DIAGNOSIS — E871 Hypo-osmolality and hyponatremia: Secondary | ICD-10-CM | POA: Diagnosis not present

## 2022-11-17 DIAGNOSIS — J432 Centrilobular emphysema: Secondary | ICD-10-CM | POA: Diagnosis not present

## 2022-11-17 DIAGNOSIS — Z9841 Cataract extraction status, right eye: Secondary | ICD-10-CM

## 2022-11-17 DIAGNOSIS — Z961 Presence of intraocular lens: Secondary | ICD-10-CM | POA: Diagnosis present

## 2022-11-17 DIAGNOSIS — M898X8 Other specified disorders of bone, other site: Secondary | ICD-10-CM | POA: Diagnosis not present

## 2022-11-17 DIAGNOSIS — R4589 Other symptoms and signs involving emotional state: Secondary | ICD-10-CM | POA: Diagnosis not present

## 2022-11-17 DIAGNOSIS — M25511 Pain in right shoulder: Secondary | ICD-10-CM | POA: Diagnosis not present

## 2022-11-17 DIAGNOSIS — R6889 Other general symptoms and signs: Secondary | ICD-10-CM | POA: Diagnosis not present

## 2022-11-17 DIAGNOSIS — W19XXXA Unspecified fall, initial encounter: Principal | ICD-10-CM

## 2022-11-17 DIAGNOSIS — C799 Secondary malignant neoplasm of unspecified site: Secondary | ICD-10-CM

## 2022-11-17 DIAGNOSIS — S42031A Displaced fracture of lateral end of right clavicle, initial encounter for closed fracture: Secondary | ICD-10-CM | POA: Diagnosis present

## 2022-11-17 DIAGNOSIS — D638 Anemia in other chronic diseases classified elsewhere: Secondary | ICD-10-CM | POA: Diagnosis not present

## 2022-11-17 DIAGNOSIS — E8809 Other disorders of plasma-protein metabolism, not elsewhere classified: Secondary | ICD-10-CM | POA: Diagnosis present

## 2022-11-17 DIAGNOSIS — R918 Other nonspecific abnormal finding of lung field: Secondary | ICD-10-CM

## 2022-11-17 DIAGNOSIS — J90 Pleural effusion, not elsewhere classified: Secondary | ICD-10-CM | POA: Diagnosis not present

## 2022-11-17 DIAGNOSIS — Z79899 Other long term (current) drug therapy: Secondary | ICD-10-CM

## 2022-11-17 DIAGNOSIS — S0990XA Unspecified injury of head, initial encounter: Secondary | ICD-10-CM | POA: Diagnosis not present

## 2022-11-17 DIAGNOSIS — R0789 Other chest pain: Secondary | ICD-10-CM | POA: Diagnosis not present

## 2022-11-17 DIAGNOSIS — R937 Abnormal findings on diagnostic imaging of other parts of musculoskeletal system: Secondary | ICD-10-CM | POA: Diagnosis not present

## 2022-11-17 DIAGNOSIS — R932 Abnormal findings on diagnostic imaging of liver and biliary tract: Secondary | ICD-10-CM | POA: Diagnosis not present

## 2022-11-17 DIAGNOSIS — M25552 Pain in left hip: Secondary | ICD-10-CM | POA: Diagnosis not present

## 2022-11-17 LAB — I-STAT CHEM 8, ED
BUN: 11 mg/dL (ref 8–23)
Calcium, Ion: 1.14 mmol/L — ABNORMAL LOW (ref 1.15–1.40)
Chloride: 99 mmol/L (ref 98–111)
Creatinine, Ser: 0.5 mg/dL (ref 0.44–1.00)
Glucose, Bld: 84 mg/dL (ref 70–99)
HCT: 28 % — ABNORMAL LOW (ref 36.0–46.0)
Hemoglobin: 9.5 g/dL — ABNORMAL LOW (ref 12.0–15.0)
Potassium: 3.5 mmol/L (ref 3.5–5.1)
Sodium: 131 mmol/L — ABNORMAL LOW (ref 135–145)
TCO2: 24 mmol/L (ref 22–32)

## 2022-11-17 LAB — CBC WITH DIFFERENTIAL/PLATELET
Abs Immature Granulocytes: 0.05 10*3/uL (ref 0.00–0.07)
Basophils Absolute: 0.1 10*3/uL (ref 0.0–0.1)
Basophils Relative: 1 %
Eosinophils Absolute: 0 10*3/uL (ref 0.0–0.5)
Eosinophils Relative: 0 %
HCT: 29.8 % — ABNORMAL LOW (ref 36.0–46.0)
Hemoglobin: 9.6 g/dL — ABNORMAL LOW (ref 12.0–15.0)
Immature Granulocytes: 1 %
Lymphocytes Relative: 10 %
Lymphs Abs: 1.1 10*3/uL (ref 0.7–4.0)
MCH: 31.1 pg (ref 26.0–34.0)
MCHC: 32.2 g/dL (ref 30.0–36.0)
MCV: 96.4 fL (ref 80.0–100.0)
Monocytes Absolute: 0.8 10*3/uL (ref 0.1–1.0)
Monocytes Relative: 7 %
Neutro Abs: 9 10*3/uL — ABNORMAL HIGH (ref 1.7–7.7)
Neutrophils Relative %: 81 %
Platelets: 441 10*3/uL — ABNORMAL HIGH (ref 150–400)
RBC: 3.09 MIL/uL — ABNORMAL LOW (ref 3.87–5.11)
RDW: 13.6 % (ref 11.5–15.5)
WBC: 11 10*3/uL — ABNORMAL HIGH (ref 4.0–10.5)
nRBC: 0 % (ref 0.0–0.2)

## 2022-11-17 LAB — COMPREHENSIVE METABOLIC PANEL
ALT: 10 U/L (ref 0–44)
AST: 16 U/L (ref 15–41)
Albumin: 2.8 g/dL — ABNORMAL LOW (ref 3.5–5.0)
Alkaline Phosphatase: 109 U/L (ref 38–126)
Anion gap: 12 (ref 5–15)
BUN: 11 mg/dL (ref 8–23)
CO2: 23 mmol/L (ref 22–32)
Calcium: 9.6 mg/dL (ref 8.9–10.3)
Chloride: 96 mmol/L — ABNORMAL LOW (ref 98–111)
Creatinine, Ser: 0.62 mg/dL (ref 0.44–1.00)
GFR, Estimated: >60 mL/min (ref >60)
Glucose, Bld: 76 mg/dL (ref 70–99)
Potassium: 3.6 mmol/L (ref 3.5–5.1)
Sodium: 131 mmol/L — ABNORMAL LOW (ref 135–145)
Total Bilirubin: 0.3 mg/dL (ref 0.3–1.2)
Total Protein: 6.5 g/dL (ref 6.5–8.1)

## 2022-11-17 MED ORDER — ENOXAPARIN SODIUM 30 MG/0.3ML IJ SOSY
30.0000 mg | PREFILLED_SYRINGE | INTRAMUSCULAR | Status: DC
  Administered 2022-11-17: 30 mg via SUBCUTANEOUS
  Filled 2022-11-17: qty 0.3

## 2022-11-17 MED ORDER — OXYCODONE-ACETAMINOPHEN 5-325 MG PO TABS
1.0000 | ORAL_TABLET | Freq: Once | ORAL | Status: AC
  Administered 2022-11-17: 1 via ORAL
  Filled 2022-11-17: qty 1

## 2022-11-17 MED ORDER — ACETAMINOPHEN 325 MG PO TABS
650.0000 mg | ORAL_TABLET | Freq: Four times a day (QID) | ORAL | Status: DC | PRN
  Administered 2022-11-19 – 2022-11-20 (×2): 650 mg via ORAL
  Filled 2022-11-17 (×2): qty 2

## 2022-11-17 MED ORDER — POLYETHYLENE GLYCOL 3350 17 G PO PACK
17.0000 g | PACK | Freq: Every day | ORAL | Status: DC | PRN
  Administered 2022-11-18: 17 g via ORAL
  Filled 2022-11-17: qty 1

## 2022-11-17 MED ORDER — MELATONIN 5 MG PO TABS
5.0000 mg | ORAL_TABLET | Freq: Every evening | ORAL | Status: DC | PRN
  Administered 2022-11-18: 5 mg via ORAL
  Filled 2022-11-17: qty 1

## 2022-11-17 MED ORDER — IOHEXOL 350 MG/ML SOLN
75.0000 mL | Freq: Once | INTRAVENOUS | Status: AC | PRN
  Administered 2022-11-17: 75 mL via INTRAVENOUS

## 2022-11-17 MED ORDER — PANTOPRAZOLE SODIUM 40 MG PO TBEC
40.0000 mg | DELAYED_RELEASE_TABLET | Freq: Every day | ORAL | Status: DC
  Administered 2022-11-18 – 2022-11-20 (×3): 40 mg via ORAL
  Filled 2022-11-17 (×3): qty 1

## 2022-11-17 MED ORDER — OXYCODONE HCL 5 MG PO TABS
5.0000 mg | ORAL_TABLET | Freq: Four times a day (QID) | ORAL | Status: DC | PRN
  Administered 2022-11-18 – 2022-11-19 (×4): 5 mg via ORAL
  Filled 2022-11-17 (×4): qty 1

## 2022-11-17 MED ORDER — SODIUM CHLORIDE 0.9 % IV SOLN: INTRAVENOUS | Status: DC

## 2022-11-17 MED ORDER — LORATADINE 10 MG PO TABS
10.0000 mg | ORAL_TABLET | Freq: Every day | ORAL | Status: DC
  Administered 2022-11-18 – 2022-11-20 (×3): 10 mg via ORAL
  Filled 2022-11-17 (×3): qty 1

## 2022-11-17 MED ORDER — PROCHLORPERAZINE EDISYLATE 10 MG/2ML IJ SOLN
5.0000 mg | Freq: Four times a day (QID) | INTRAMUSCULAR | Status: DC | PRN
  Administered 2022-11-19: 5 mg via INTRAVENOUS
  Filled 2022-11-17: qty 2
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
## 2022-11-17 NOTE — ED Notes (Signed)
Patient transported to CT 

## 2022-11-17 NOTE — ED Notes (Signed)
Patient assisted to bedpan.

## 2022-11-17 NOTE — ED Notes (Signed)
[facility] called for transport to [place] 
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
## 2022-11-17 NOTE — Progress Notes (Signed)
Orthopedic Tech Progress Note Patient Details:  LEGEND CARBONARO June 05, 1946 [id number]  Ortho Devices Type of Ortho Device: Shoulder immobilizer Ortho Device/Splint Location: RUE Ortho Device/Splint Interventions: Ordered, Application, Adjustment   Post Interventions Patient Tolerated: Well Instructions Provided: Care of device, Adjustment of device  [place] A Gerilyn Pilgrim 11/17/2022, 2:58 PM
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
## 2022-11-18 ENCOUNTER — Inpatient Hospital Stay (HOSPITAL_COMMUNITY): Payer: Medicare Other

## 2022-11-18 DIAGNOSIS — R918 Other nonspecific abnormal finding of lung field: Secondary | ICD-10-CM | POA: Diagnosis not present

## 2022-11-18 LAB — MAGNESIUM: Magnesium: 1.7 mg/dL (ref 1.7–2.4)

## 2022-11-18 LAB — BASIC METABOLIC PANEL
Anion gap: 9 (ref 5–15)
BUN: 14 mg/dL (ref 8–23)
CO2: 26 mmol/L (ref 22–32)
Calcium: 9.1 mg/dL (ref 8.9–10.3)
Chloride: 99 mmol/L (ref 98–111)
Creatinine, Ser: 0.61 mg/dL (ref 0.44–1.00)
GFR, Estimated: >60 mL/min (ref >60)
Glucose, Bld: 86 mg/dL (ref 70–99)
Potassium: 3.4 mmol/L — ABNORMAL LOW (ref 3.5–5.1)
Sodium: 134 mmol/L — ABNORMAL LOW (ref 135–145)

## 2022-11-18 LAB — CBC
HCT: 27.9 % — ABNORMAL LOW (ref 36.0–46.0)
Hemoglobin: 9 g/dL — ABNORMAL LOW (ref 12.0–15.0)
MCH: 31.4 pg (ref 26.0–34.0)
MCHC: 32.3 g/dL (ref 30.0–36.0)
MCV: 97.2 fL (ref 80.0–100.0)
Platelets: 374 10*3/uL (ref 150–400)
RBC: 2.87 MIL/uL — ABNORMAL LOW (ref 3.87–5.11)
RDW: 13.8 % (ref 11.5–15.5)
WBC: 9 10*3/uL (ref 4.0–10.5)
nRBC: 0 % (ref 0.0–0.2)

## 2022-11-18 LAB — PROTIME-INR
INR: 1 (ref 0.8–1.2)
Prothrombin Time: 13.1 seconds (ref 11.4–15.2)

## 2022-11-18 LAB — PHOSPHORUS: Phosphorus: 3.3 mg/dL (ref 2.5–4.6)

## 2022-11-18 MED ORDER — FENTANYL CITRATE (PF) 100 MCG/2ML IJ SOLN
INTRAMUSCULAR | Status: AC | PRN
  Administered 2022-11-18 (×2): 50 ug via INTRAVENOUS

## 2022-11-18 MED ORDER — FENTANYL CITRATE (PF) 100 MCG/2ML IJ SOLN
INTRAMUSCULAR | Status: AC
  Filled 2022-11-18: qty 4

## 2022-11-18 MED ORDER — MIDAZOLAM HCL 2 MG/2ML IJ SOLN
INTRAMUSCULAR | Status: AC | PRN
  Administered 2022-11-18 (×2): 1 mg via INTRAVENOUS

## 2022-11-18 MED ORDER — NALOXONE HCL 0.4 MG/ML IJ SOLN
INTRAMUSCULAR | Status: AC
  Filled 2022-11-18: qty 1

## 2022-11-18 MED ORDER — LIDOCAINE HCL (PF) 1 % IJ SOLN
INTRAMUSCULAR | Status: AC | PRN
  Administered 2022-11-18: 10 mL

## 2022-11-18 MED ORDER — MIDAZOLAM HCL 2 MG/2ML IJ SOLN
INTRAMUSCULAR | Status: AC
  Filled 2022-11-18: qty 4

## 2022-11-18 MED ORDER — FLUMAZENIL 0.5 MG/5ML IV SOLN
INTRAVENOUS | Status: AC
  Filled 2022-11-18: qty 5

## 2022-11-18 NOTE — Progress Notes (Signed)
Oncology note   Pt had left iliac mass biopsy by IR this afternoon. She was still very drowsy after the procedure when I saw her this afternoon.  She was not able to talk to me.   I will see her tomorrow, hopefully I will have the preliminary results tomorrow.  Malachy Mood MD  11/18/2022

## 2022-11-18 NOTE — Sedation Documentation (Signed)
Sample #4 obtained 

## 2022-11-18 NOTE — Sedation Documentation (Signed)
Sample #1 obtained 
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
## 2022-11-18 NOTE — Sedation Documentation (Signed)
Sample #3 obtained 
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
## 2022-11-18 NOTE — Sedation Documentation (Signed)
Sample #5 obtained 

## 2022-11-18 NOTE — Procedures (Signed)
Interventional Radiology Procedure Note  Procedure: CT guided left iliac mass biopsy  Indication: Left iliac mass  Findings: Please refer to procedural dictation for full description.  Complications: None  EBL: < 10 mL  Acquanetta Belling, MD [phone]

## 2022-11-18 NOTE — TOC Progression Note (Signed)
Transition of Care [facility]) - Progression Note    Patient Details  Name: Ashley Howard MRN: [id number] Date of Birth: 1945/10/01  Transition of Care [facility]) CM/SW Contact  Beckie Busing, RN Phone Number:[phone]  11/18/2022, 3:23 PM  Clinical Narrative:     Transition of Care [facility]) Screening Note   Patient Details  Name: Ashley Howard Date of Birth: June 15, 1946   Transition of Care [facility]) CM/SW Contact:    Beckie Busing, RN Phone Number: 11/18/2022, 3:24 PM    Transition of Care Department [facility]) has reviewed patient and no TOC needs have been identified at this time. We will continue to monitor patient advancement through interdisciplinary progression rounds. If new patient transition needs arise, please place a TOC consult.          Expected Discharge Plan and Services                                               Social Determinants of Health (SDOH) Interventions SDOH Screenings   Food Insecurity: No Food Insecurity (11/17/2022)  Housing: Low Risk  (11/17/2022)  Transportation Needs: No Transportation Needs (11/17/2022)  Utilities: Not At Risk (11/17/2022)  Depression (PHQ2-9): Low Risk  (10/25/2022)  Tobacco Use: Medium Risk (11/17/2022)    Readmission Risk Interventions     No data to display

## 2022-11-18 NOTE — Sedation Documentation (Signed)
Sample #6 obtained 

## 2022-11-18 NOTE — Sedation Documentation (Signed)
Sample #7 obtained 

## 2022-11-18 NOTE — Sedation Documentation (Signed)
Sample #2 obtained 

## 2022-11-19 DIAGNOSIS — Z66 Do not resuscitate: Secondary | ICD-10-CM

## 2022-11-19 DIAGNOSIS — R918 Other nonspecific abnormal finding of lung field: Secondary | ICD-10-CM

## 2022-11-19 DIAGNOSIS — D638 Anemia in other chronic diseases classified elsewhere: Secondary | ICD-10-CM

## 2022-11-19 DIAGNOSIS — S42001A Fracture of unspecified part of right clavicle, initial encounter for closed fracture: Secondary | ICD-10-CM

## 2022-11-19 DIAGNOSIS — R932 Abnormal findings on diagnostic imaging of liver and biliary tract: Secondary | ICD-10-CM | POA: Diagnosis not present

## 2022-11-19 DIAGNOSIS — Z79899 Other long term (current) drug therapy: Secondary | ICD-10-CM

## 2022-11-19 DIAGNOSIS — Z515 Encounter for palliative care: Secondary | ICD-10-CM

## 2022-11-19 DIAGNOSIS — E871 Hypo-osmolality and hyponatremia: Secondary | ICD-10-CM | POA: Diagnosis not present

## 2022-11-19 DIAGNOSIS — M899 Disorder of bone, unspecified: Secondary | ICD-10-CM

## 2022-11-19 DIAGNOSIS — Z7189 Other specified counseling: Secondary | ICD-10-CM

## 2022-11-19 DIAGNOSIS — R937 Abnormal findings on diagnostic imaging of other parts of musculoskeletal system: Secondary | ICD-10-CM | POA: Diagnosis not present

## 2022-11-19 DIAGNOSIS — R52 Pain, unspecified: Secondary | ICD-10-CM

## 2022-11-19 DIAGNOSIS — Z87891 Personal history of nicotine dependence: Secondary | ICD-10-CM

## 2022-11-19 DIAGNOSIS — R4589 Other symptoms and signs involving emotional state: Secondary | ICD-10-CM

## 2022-11-19 DIAGNOSIS — C799 Secondary malignant neoplasm of unspecified site: Secondary | ICD-10-CM

## 2022-11-19 DIAGNOSIS — K5904 Chronic idiopathic constipation: Secondary | ICD-10-CM

## 2022-11-19 DIAGNOSIS — J90 Pleural effusion, not elsewhere classified: Secondary | ICD-10-CM

## 2022-11-19 DIAGNOSIS — E440 Moderate protein-calorie malnutrition: Secondary | ICD-10-CM

## 2022-11-19 LAB — COMPREHENSIVE METABOLIC PANEL
ALT: 8 U/L (ref 0–44)
AST: 13 U/L — ABNORMAL LOW (ref 15–41)
Albumin: 2.5 g/dL — ABNORMAL LOW (ref 3.5–5.0)
Alkaline Phosphatase: 84 U/L (ref 38–126)
Anion gap: 10 (ref 5–15)
BUN: 13 mg/dL (ref 8–23)
CO2: 22 mmol/L (ref 22–32)
Calcium: 8.3 mg/dL — ABNORMAL LOW (ref 8.9–10.3)
Chloride: 96 mmol/L — ABNORMAL LOW (ref 98–111)
Creatinine, Ser: 0.58 mg/dL (ref 0.44–1.00)
GFR, Estimated: >60 mL/min (ref >60)
Glucose, Bld: 90 mg/dL (ref 70–99)
Potassium: 3.4 mmol/L — ABNORMAL LOW (ref 3.5–5.1)
Sodium: 128 mmol/L — ABNORMAL LOW (ref 135–145)
Total Bilirubin: 0.6 mg/dL (ref 0.3–1.2)
Total Protein: 5.6 g/dL — ABNORMAL LOW (ref 6.5–8.1)

## 2022-11-19 LAB — MAGNESIUM: Magnesium: 1.5 mg/dL — ABNORMAL LOW (ref 1.7–2.4)

## 2022-11-19 LAB — SURGICAL PATHOLOGY

## 2022-11-19 MED ORDER — OXYCODONE HCL 5 MG PO TABS
5.0000 mg | ORAL_TABLET | ORAL | Status: DC | PRN
  Administered 2022-11-20 (×2): 5 mg via ORAL
  Filled 2022-11-19 (×2): qty 1

## 2022-11-19 MED ORDER — MAGNESIUM SULFATE 2 GM/50ML IV SOLN
2.0000 g | Freq: Once | INTRAVENOUS | Status: AC
  Administered 2022-11-19: 2 g via INTRAVENOUS
  Filled 2022-11-19: qty 50

## 2022-11-19 MED ORDER — SENNOSIDES-DOCUSATE SODIUM 8.6-50 MG PO TABS
1.0000 | ORAL_TABLET | Freq: Two times a day (BID) | ORAL | Status: DC
  Administered 2022-11-19: 1 via ORAL
  Filled 2022-11-19: qty 1

## 2022-11-19 MED ORDER — LORAZEPAM 2 MG/ML PO CONC: 0.5000 mg | Freq: Four times a day (QID) | ORAL | Status: DC | PRN

## 2022-11-19 MED ORDER — SODIUM CHLORIDE 1 G PO TABS
1.0000 g | ORAL_TABLET | Freq: Three times a day (TID) | ORAL | Status: DC
  Administered 2022-11-19 – 2022-11-20 (×4): 1 g via ORAL
  Filled 2022-11-19 (×3): qty 1

## 2022-11-19 MED ORDER — POLYETHYLENE GLYCOL 3350 17 G PO PACK
17.0000 g | PACK | Freq: Two times a day (BID) | ORAL | Status: DC
  Administered 2022-11-19 – 2022-11-20 (×2): 17 g via ORAL
  Filled 2022-11-19 (×2): qty 1

## 2022-11-19 MED ORDER — POTASSIUM CHLORIDE CRYS ER 20 MEQ PO TBCR
40.0000 meq | EXTENDED_RELEASE_TABLET | Freq: Once | ORAL | Status: AC
  Administered 2022-11-19: 40 meq via ORAL
  Filled 2022-11-19: qty 2

## 2022-11-19 MED ORDER — SENNA 8.6 MG PO TABS
2.0000 | ORAL_TABLET | Freq: Two times a day (BID) | ORAL | Status: DC
  Administered 2022-11-19 – 2022-11-20 (×3): 17.2 mg via ORAL
  Filled 2022-11-19 (×3): qty 2
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
## 2022-11-19 NOTE — Progress Notes (Signed)
Chaplain engaged in an initial visit with Ashley Howard. [name] shared that she was recently diagnosed with stage 4 cancer and she knows she is dying. When Chaplain asked about possible interventions, she stated that she didn't want to do chemo because it would make her sicker. Ashley Howard vocalized wanting to go back with her friend and caregiver Ashley Howard to be taken care of, wanting to be DNR, and wanting to be taken off a number of the medicines she has been on. Chaplain affirmed her wants and desires, and her agency. [name] voiced that she wants to medical team to involve her caregiver and friend Ashley Howard in pertinent conversations about where she will discharge.   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 That proved to be a stressful time in her life. She has found a lot of care, joy, and love in being cared for by [facility] and her mom, Ashley Howard. She considers Ashley Howard to be her best friend and sister. Though they are culturally different, she expressed a great connection to Ashley Howard and her family.   Chaplain offered support, reflective listening, and education. Chaplain let Ashley Howard know about the Palliative Care team and speaking with them about her needs.     11/19/22 1300  Spiritual Encounters  Type of Visit Initial  Care provided to: Patient  Referral source Physician  Reason for visit Routine spiritual support  Spiritual Framework  Presenting Themes Goals in life/care;Community and relationships;Significant life change;Impactful experiences and emotions  Interventions  Spiritual Care Interventions Made Reflective listening;Compassionate presence;Established relationship of care and support  Intervention Outcomes  Outcomes Awareness of support
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
## 2022-11-19 NOTE — TOC Initial Note (Signed)
Transition of Care [facility]) - Initial/Assessment Note    Patient Details  Name: Ashley Howard MRN: [id number] Date of Birth: 1946/02/16  Transition of Care [facility]) CM/SW Contact:    Beckie Busing, RN Phone Number:[phone]  11/19/2022, 3:41 PM  Clinical Narrative:                 [facility] acknowledges consult for outpatient hospice referral. CM at bedside introduces self and explained role. Patient states that she is from home where she previously functioned independently and lives with a roommate. Patient states that she will be going to live with [name]. Patient does acknowledge that she is agreeable to home with hospice but would like CM to speak with [name] who will be taking care of her once she is discharged. CM called [name] [phone]. [name] would like Outpatient hospice services with [facility]. CM has called referral to hospital liaison for [facility].     Barriers to Discharge: Continued Medical Work up   Patient Goals and CMS Choice Patient states their goals for this hospitalization and ongoing recovery are:: Wants to go home with [facility] where she can be taken care of. CMS Medicare.gov Compare Post Acute Care list provided to:: Patient Represenative (must comment) [name]) Choice offered to / list presented to : NA (friend) [place] ownership interest in [facility].provided to:: Patient    Expected Discharge Plan and Services In-house Referral: NA Discharge Planning Services: CM Consult Post Acute Care Choice: Hospice Living arrangements for the past 2 months: Single Family Home                 DME Arranged: N/A (deferred to hospice) DME Agency: NA                  Prior Living Arrangements/Services Living arrangements for the past 2 months: Single Family Home Lives with:: Roommate Patient language and need for interpreter reviewed:: Yes Do you feel safe going back to the place where you live?: Yes      Need for Family  Participation in Patient Care: Yes (Comment) Care giver support system in place?: Yes (comment) Current home services:  (n/a) Criminal Activity/Legal Involvement Pertinent to Current Situation/Hospitalization: No - Comment as needed  Activities of Daily Living Home Assistive Devices/Equipment: Environmental consultant (specify type), Wheelchair ADL Screening (condition at time of admission) Patient's cognitive ability adequate to safely complete daily activities?: Yes Is the patient deaf or have difficulty hearing?: Yes Does the patient have difficulty seeing, even when wearing glasses/contacts?: No Does the patient have difficulty concentrating, remembering, or making decisions?: No Patient able to express need for assistance with ADLs?: Yes Does the patient have difficulty dressing or bathing?: Yes Independently performs ADLs?: No Does the patient have difficulty walking or climbing stairs?: Yes Weakness of Legs: Both Weakness of Arms/Hands: Both  Permission Sought/Granted Permission sought to share information with : Family Supports Permission granted to share information with : Yes, Verbal Permission Granted  Share Information with NAME: [name]     Permission granted to share info w Relationship: friend  Permission granted to share info w Contact Information: [phone]  Emotional Assessment Appearance:: Appears stated age Attitude/Demeanor/Rapport: Gracious Affect (typically observed): Accepting, Pleasant Orientation: : Oriented to Self, Oriented to Place, Oriented to  Time, Oriented to Situation Alcohol / Substance Use: Not Applicable    Admission diagnosis:  Hyponatremia [E87.1] Lung mass [R91.8] Lytic bone lesions on xray [M89.9] Fall, initial encounter [W19.XXXA] Anemia, unspecified type [D64.9]  Closed displaced fracture of right clavicle, unspecified part of clavicle, initial encounter [S42.001A] Patient Active Problem List   Diagnosis Date Noted   Lung mass 11/17/2022    Osteoporosis 10/25/2022   Arthritis 10/25/2022   Chronic constipation 10/25/2022   T12 compression fracture (HCC) 12/30/2018   Anemia    Rectal bleeding    Urinary incontinence 08/21/2016   GERD (gastroesophageal reflux disease) 08/21/2016   Neuropathic pain 08/21/2016   Hyperglycemia 08/21/2016   Protein-calorie malnutrition, severe 07/20/2016   Abnormality of gait 01/04/2013   PCP:  Myrlene Broker, MD Pharmacy:   [facility] - [address] ([place]), [place] - [address] [place] ([place]) [place] [address] Phone: [phone] Fax: [phone]     Social Determinants of Health (SDOH) Social History: SDOH Screenings   Food Insecurity: No Food Insecurity (11/17/2022)  Housing: Low Risk  (11/17/2022)  Transportation Needs: No Transportation Needs (11/17/2022)  Utilities: Not At Risk (11/17/2022)  Depression (PHQ2-9): Low Risk  (10/25/2022)  Tobacco Use: Medium Risk (11/17/2022)   SDOH Interventions:     Readmission Risk Interventions    11/19/2022    3:30 PM  Readmission Risk Prevention Plan  Post Dischage Appt Complete  Medication Screening Complete

## 2022-11-19 NOTE — Consult Note (Signed)
Consultation Note Date: 11/19/2022   Patient Name: Ashley Howard  DOB: 1946-01-27  MRN: [id number]  Age / Sex: 77 y.o., female   PCP: Myrlene Broker, MD Referring Physician: Glade Lloyd, MD  Reason for Consultation: Establishing goals of care     Chief Complaint/History of Present Illness:   Patient is a 77 year old female with past medical history of GERD, tobacco use, and seasonal allergies who was admitted on 11/17/2022 for management of right shoulder and left hip pain.  Imaging obtained upon presentation showed large right upper lobe mass in chest with possible invasion of mass into SVC with metastatic disease to liver, kidney, and bone as well as a lytic lesion with pathologic fracture of the lateral right clavicle at the [facility] joint and lytic lesions in the left ilium and sacroiliac joint.  Oncology, pulmonology, and IR were consulted for evaluation.  Patient underwent CT-guided left iliac mass biopsy on 11/18/2022.  Palliative medicine team consulted to assist with complex medical decision making.  Extensive EMR review prior to presenting to bedside.  Patient has ACP documentation on file regarding HCPOA though notes and recent EMR noted patient is estranged from her son who was noted to be her HCPOA from 2018.  Presented to bedside to meet with patient.  Introduced myself and the role of the palliative medicine team in patient's care.  On presentation, patient requested that her caregiver [name] be included in conversation.  Was able to call [name] and place her on speaker phone to join in conversation.  Again introduced myself and the role of the palliative medicine team in patient's care.  Patient able to discuss her recent diagnosis of metastatic cancer.  Patient immediately states that she wants to go back home with [name] to be comfortable and does not want to seek cancer directed therapies as she feels this will make her sicker and die faster.  Also able to obtain some  history from he is on speaker phone.  [name] has been caring for the patient for over 6 years.  Patient initially came from independent living facility where [facility] assists in her care though with this diagnosis, [name] has agreed she wants to bring patient back to her home and care for her at the end of life.  [name] explained that patient lived with her and her mother a while back and they all full in the room to really close mom and.  They are really good friends and so [name] wants to support patient being by the ones who love her at the end of life.  [name] shared that she is an Charity fundraiser.  Reviewed patient's ACP documentation she currently has on file.  Patient noted that she is estranged from her son and would like [name] to be her medical decision-maker if she is unable to make medical decisions for herself.  Patient also noted that she is working to get [employer] for [employer] as well.  [name] noted that they have this paperwork but they are planning to follow-up properly outside of the hospital.  Discussed hospital can assist with [facility] paperwork though not POA paperwork.  Patient agreeing with completing HCPOA paperwork at this time to name [name] as her healthcare power of attorney.  Patient confirmed her DNR status.  Patient's goal is to return back to [facility] as soon as possible.  Discussed support of hospice and how this could assist with symptom management at the end of life knowing patient does not want to seek any  cancer directed therapies.  Patient and [name] agreed this would be a beneficial support for patient to prevent any further hospitalizations and to make sure she is comfortable at [facility] home.  Noted would involve TOC to assist with coordination of home hospice.  Patient's only symptom concern at this time was constipation.  Patient has had issues with chronic constipation in the past.  Discussed aggressive bowel regimen at this time including MiraLAX twice daily and senna tablets twice daily.   Noted patient can receive up to 8 tabs of senna in a day.  Patient and [name] agreeing with aggressive symptom management at this time due to patient's underlying chronic constipation.  All questions answered at that time.  Thanked patient and [name] for speaking with me today.  Noted palliative medicine team would continue to follow along with patient's care.  Updated care team regarding discussion with patient and patient's desire to go home with [facility] with hospice support as soon as possible.  Primary Diagnoses  Present on Admission:  Lung mass   Palliative Review of Systems: Constipation, last bowel movement 5/4  Past Medical History:  Diagnosis Date   Anemia    Arthritis    "qwhere" (08/21/2016)   Aspiration pneumonia (HCC) 07/2016   Bowel incontinence    Chronic back pain    Chronic bronchitis (HCC)    Chronic low back pain    Fluid retention in legs    wears compression stocking R leg   Gait abnormality    Gallstones    GERD (gastroesophageal reflux disease)    H/O hiatal hernia    H/O small bowel obstruction 07/2016   admitted to [facility] 1/2 -07/24/16 with SBO which caused aspiration pneumonia and septic shock/notes 08/21/2016   Hearing loss    mild   Hemorrhoid    History of blood transfusion 1975   "had a reaction"   Incontinence of urine    wears adult briefs   Pain, dental    PONV (postoperative nausea and vomiting)    "doesn't bother me so bad anymore" (08/21/2016)   Poor historian    Protein calorie malnutrition (HCC)    Scoliosis    Scoliosis    Seasonal allergies    Shortness of breath    with pain   Small bowel obstruction (HCC) 08/21/2016   Status post partial amputation of foot, left (HCC)    Weight loss, unintentional    40 lbs in last year   Social History   Socioeconomic History   Marital status: Married    Spouse name: Dimas Aguas   Number of children: 1   Years of education: Not on file   Highest education level: Not on file  Occupational History    Occupation: retired  Tobacco Use   Smoking status: Former    Packs/day: 1.00    Years: 54.00    Additional pack years: 0.00    Total pack years: 54.00    Types: Cigarettes    Quit date: 07/15/2016    Years since quitting: 6.3   Smokeless tobacco: Never  Vaping Use   Vaping Use: Never used  Substance and Sexual Activity   Alcohol use: No    Comment: 08/21/2016 "nothing since ~ 04/2016"   Drug use: No   Sexual activity: Not Currently  Other Topics Concern   Not on file  Social History Narrative   Patient is retired.  As of 2018 she was staying at [facility] nursing home. Previously she was living at home alone, her  husband, who has dementia, lives in a SNF    Right handed.   Caffeine- coffee daily.   Social Determinants of Health   Financial Resource Strain: Not on file  Food Insecurity: No Food Insecurity (11/17/2022)   Hunger Vital Sign    Worried About Running Out of Food in the Last Year: Never true    Ran Out of Food in the Last Year: Never true  Transportation Needs: No Transportation Needs (11/17/2022)   PRAPARE - Administrator, Civil Service (Medical): No    Lack of Transportation (Non-Medical): No  Physical Activity: Not on file  Stress: Not on file  Social Connections: Not on file   Family History  Problem Relation Age of Onset   Hypertension Mother    Cancer Father    Diabetes Neg Hx    Stroke Neg Hx    Scheduled Meds:  loratadine  10 mg Oral Daily   pantoprazole  40 mg Oral Daily   senna-docusate  1 tablet Oral BID   sodium chloride  1 g Oral TID WC   Continuous Infusions: PRN Meds:.acetaminophen, melatonin, oxyCODONE, polyethylene glycol, prochlorperazine Allergies  Allergen Reactions   Tape Other (See Comments)    Paper tape - blisters   CBC:    Component Value Date/Time   WBC 9.0 11/18/2022 0803   HGB 9.0 (L) 11/18/2022 0803   HCT 27.9 (L) 11/18/2022 0803   PLT 374 11/18/2022 0803   MCV 97.2 11/18/2022 0803   NEUTROABS 9.0 (H)  11/17/2022 1650   LYMPHSABS 1.1 11/17/2022 1650   MONOABS 0.8 11/17/2022 1650   EOSABS 0.0 11/17/2022 1650   BASOSABS 0.1 11/17/2022 1650   Comprehensive Metabolic Panel:    Component Value Date/Time   NA 128 (L) 11/19/2022 0619   NA 132 (A) 09/18/2016 0000   K 3.4 (L) 11/19/2022 0619   CL 96 (L) 11/19/2022 0619   CO2 22 11/19/2022 0619   BUN 13 11/19/2022 0619   BUN 17 09/18/2016 0000   CREATININE 0.58 11/19/2022 0619   CREATININE 0.73 02/28/2021 0000   GLUCOSE 90 11/19/2022 0619   CALCIUM 8.3 (L) 11/19/2022 0619   AST 13 (L) 11/19/2022 0619   ALT 8 11/19/2022 0619   ALKPHOS 84 11/19/2022 0619   BILITOT 0.6 11/19/2022 0619   PROT 5.6 (L) 11/19/2022 0619   ALBUMIN 2.5 (L) 11/19/2022 0619    Physical Exam: Vital Signs: BP (!) 145/75 (BP Location: Left Arm)   Pulse 78   Temp 98.4 F (36.9 C) (Oral)   Resp 16   Ht 4' 9.5" (1.461 m)   Wt 48.1 kg   SpO2 99%   BMI 22.55 kg/m  SpO2: SpO2: 99 % O2 Device: O2 Device: Nasal Cannula O2 Flow Rate: O2 Flow Rate (L/min): 1 L/min Intake/output summary:  Intake/Output Summary (Last 24 hours) at 11/19/2022 1914 Last data filed at 11/19/2022 0500 Gross per 24 hour  Intake 554.7 ml  Output --  Net 554.7 ml   LBM: Last BM Date : 11/16/22 Baseline Weight: Weight: 44.5 kg Most recent weight: Weight: 48.1 kg  General: NAD, alert, chronically ill-appearing Eyes: No drainage noted HENT: moist mucous membranes Cardiovascular: RRR Respiratory: no increased work of breathing noted, not in respiratory distress Abdomen: not distended Neuro: A&Ox4, following commands easily Psych: appropriately answers all questions          Palliative Performance Scale: 40%              Additional Data Reviewed: Recent  Labs    11/17/22 1650 11/18/22 0803 11/19/22 0619  WBC 11.0* 9.0  --   HGB 9.6* 9.0*  --   PLT 441* 374  --   NA 131* 134* 128*  BUN 11 14 13   CREATININE 0.62 0.61 0.58    Imaging: CT BIOPSY INDICATION: 77 year old  woman with multifocal metastatic disease presents to IR for biopsy of left iliac mass.  EXAM: CT-guided biopsy of left iliac mass  TECHNIQUE: Multidetector CT imaging of the pelvis was performed following the standard protocol without IV contrast.  RADIATION DOSE REDUCTION: This exam was performed according to the departmental dose-optimization program which includes automated exposure control, adjustment of the mA and/or kV according to patient size and/or use of iterative reconstruction technique.  MEDICATIONS: None.  ANESTHESIA/SEDATION: Moderate (conscious) sedation was employed during this procedure. A total of Versed 2 mg and Fentanyl 100 mcg was administered intravenously by the radiology nurse.  Total intra-service moderate Sedation Time: 14 minutes. The patient's level of consciousness and vital signs were monitored continuously by radiology nursing throughout the procedure under my direct supervision.  COMPLICATIONS: None immediate.  PROCEDURE: Informed written consent was obtained from the patient after a thorough discussion of the procedural risks, benefits and alternatives. All questions were addressed. Maximal Sterile Barrier Technique was utilized including caps, mask, sterile gowns, sterile gloves, sterile drape, hand hygiene and skin antiseptic. A timeout was performed prior to the initiation of the procedure.  Patient position prone on the CT table.  Left posterior pelvic skin prepped and draped in usual sterile fashion.  Following local lidocaine administration, 17 gauge introducer needle was advanced into the left posterior iliac mass utilizing CT guidance.  Four cores were obtained from the left posterior iliac mass and sent to pathology in formalin.  Needle removed and hemostasis achieved with manual compression.  IMPRESSION: CT-guided biopsy of left posterior iliac mass.  Electronically Signed   By: Acquanetta Belling M.D.   On: 11/18/2022  15:18    I personally reviewed recent imaging.   Palliative Care Assessment and Plan Summary of Established Goals of Care and Medical Treatment Preferences   Patient is a 77 year old female with past medical history of GERD, tobacco use, and seasonal allergies who was admitted on 11/17/2022 for management of right shoulder and left hip pain.  Imaging obtained upon presentation showed large right upper lobe mass in chest with possible invasion of mass into SVC with metastatic disease to liver, kidney, and bone as well as a lytic lesion with pathologic fracture of the lateral right clavicle at the [facility] joint and lytic lesions in the left ilium and sacroiliac joint.  Oncology, pulmonology, and IR were consulted for evaluation.  Patient underwent CT-guided left iliac mass biopsy on 11/18/2022.  Palliative medicine team consulted to assist with complex medical decision making.  # Complex medical decision making/goals of care  -Extensive conversation with patient and her caregiver, Desma Maxim, on speaker phone as described above in HPI.  Patient able to state known metastatic cancer.  Patient states she would not want to seek any cancer directed therapies.  Patient would want to focus on symptom management at the end of her life.  Patient wants to return to [facility] which [facility] is agreeing to support.  [name] is an Charity fundraiser who notes patient lives with her previously and they became very close friends.  Patient and [facility] agreeing with patient returning to her home with hospice support.  -Patient wants to focus on comfort and does  not want further aggressive interventions: no further lab work or imaging.   -  Code Status: DNR -Need to provide with completed DNR form to take home.   Prognosis: < 6 months  # Symptom management  -Constipation, chronic   -Start MiraLAX 17 g twice daily scheduled   -Start senna 2 tabs twice daily scheduled   -Pain, metastatic cancer   -Start oxycodone 5mg  q4hrs prn     -Anxiety/Agitation, metastatic cancer   -Start Ativan SL 0.5mg  q6hrs prn  # Psycho-social/Spiritual Support:  - Support System: Desma Maxim - Desire for further Chaplain support:yes. Discussed with chaplain who will assist with HCPOA.   # Discharge Planning:  Home with Hospice. Patient wants to return to [facility] with hospice with [name] agrees with supporting.   Thank you for allowing the palliative care team to participate in the care [name].  Alvester Morin, DO Palliative Care Provider PMT # [phone]  If patient remains symptomatic despite maximum doses, please call PMT at [phone] between 0700 and 1900. Outside of these hours, please call attending, as PMT does not have night coverage.  *Please note that this is a verbal dictation therefore any spelling or grammatical errors are due to the "Dragon Medical One" system interpretation.

## 2022-11-20 DIAGNOSIS — M899 Disorder of bone, unspecified: Secondary | ICD-10-CM | POA: Diagnosis not present

## 2022-11-20 DIAGNOSIS — R918 Other nonspecific abnormal finding of lung field: Secondary | ICD-10-CM | POA: Diagnosis not present

## 2022-11-20 DIAGNOSIS — E871 Hypo-osmolality and hyponatremia: Secondary | ICD-10-CM | POA: Diagnosis not present

## 2022-11-20 DIAGNOSIS — W19XXXA Unspecified fall, initial encounter: Secondary | ICD-10-CM | POA: Diagnosis not present

## 2022-11-20 DIAGNOSIS — C799 Secondary malignant neoplasm of unspecified site: Secondary | ICD-10-CM

## 2022-11-20 MED ORDER — OXYCODONE HCL 5 MG PO TABS: 5.0000 mg | ORAL_TABLET | ORAL | 0 refills | Status: AC | PRN

## 2022-11-20 MED ORDER — SODIUM CHLORIDE 1 G PO TABS: 1.0000 g | ORAL_TABLET | Freq: Three times a day (TID) | ORAL | 1 refills | Status: AC

## 2022-11-20 MED ORDER — POLYETHYLENE GLYCOL 3350 17 G PO PACK: 17.0000 g | PACK | Freq: Two times a day (BID) | ORAL | 0 refills | Status: AC

## 2022-11-20 NOTE — TOC Progression Note (Signed)
Transition of Care [facility]) - Progression Note    Patient Details  Name: Ashley Howard MRN: [id number] Date of Birth: [birth year]  Transition of Care [facility]) CM/SW Contact  Beckie Busing, RN Phone Number:[phone]  11/20/2022, 2:17 PM  Clinical Narrative:    CM made aware that patient will need transportation home. CM spoke with [name] who states that she is able to pick patient up for transport home. Nurse has been updated. No other needs noted at this time. TOC will sign off.      Barriers to Discharge: Continued Medical Work up  Expected Discharge Plan and Services In-house Referral: NA Discharge Planning Services: CM Consult Post Acute Care Choice: Hospice Living arrangements for the past 2 months: Single Family Home Expected Discharge Date: 11/20/22               DME Arranged: N/A (deferred to hospice) DME Agency: NA                   Social Determinants of Health (SDOH) Interventions SDOH Screenings   Food Insecurity: No Food Insecurity (11/17/2022)  Housing: Low Risk  (11/17/2022)  Transportation Needs: No Transportation Needs (11/17/2022)  Utilities: Not At Risk (11/17/2022)  Depression (PHQ2-9): Low Risk  (10/25/2022)  Tobacco Use: Medium Risk (11/17/2022)    Readmission Risk Interventions    11/19/2022    3:30 PM  Readmission Risk Prevention Plan  Post Dischage Appt Complete  Medication Screening Complete

## 2022-11-20 NOTE — Progress Notes (Signed)
Chaplain engaged in a follow-up visit with Ashley Howard. Chaplain was able to provide Advanced Directive, Healthcare POA, education and support. Chaplain facilitated the notarization and witnessing of the Advanced Directive. Chaplain provided three copies to Ranim, including the original. Chaplain also input one in her medical chart and sent one to ACP documents.   Ashley Howard became tearful as she expressed her journey to learning about having stage 4 cancer. She also became emotional during the decision making process. Chaplain provided compassionate presence and reflective listening. Chaplain affirmed her emotions and highlighted the teams desire to uplift her wishes and desires.  Chaplain Leila Schuff, MDiv  11/20/22 1000  Spiritual Encounters  Type of Visit Follow up  Care provided to: Patient  Referral source Physician  Reason for visit Advance directives  Spiritual Framework  Presenting Themes Goals in life/care  Interventions  Spiritual Care Interventions Made Decision-making support/facilitation;Reflective listening;Normalization of emotions;Established relationship of care and support;Compassionate presence  Intervention Outcomes  Outcomes Awareness of support;Autonomy/agency;Connection to values and goals of care;Connection to spiritual care
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
## 2022-11-20 NOTE — Progress Notes (Signed)
Daily Progress Note   Patient Name: Ashley [facility]       Date: 11/20/2022 DOB: 05/13/1946  Age: 77 y.o. MRN#: [id number] Attending Physician: Ashley Courser, MD Primary Care Physician: Ashley Broker, MD Admit Date: 11/17/2022 Length of Stay: 3 days  Reason for Consultation/Follow-up: Establishing goals of care and symptom management  Subjective:   CC: Patient wanting to get to [facility] with hospice support as soon as possible.  Regarding complex medical decision making and symptom management.  Subjective:  Reviewed EMR prior to presenting to bedside.  When seen patient today, she is laying comfortably in bed.  No visitors present at bedside.  Patient able to update me that she had a good bowel movement this morning and feels much better regarding her constipation.  Discussed importance of remaining on bowel regimen moving forward.  Patient denies any symptoms of concern related to nausea, anxiety, or pain currently.  Patient wants to get out of the hospital to [facility] with hospice as soon as this has all been set up.  All questions answered at that time.  Provided emotional support via active listening.  Noted palliative medicine team would continue to follow along with patient's medical journey.   Review of Systems Denies symptoms of concern currently  Objective:   Vital Signs:  BP 133/63 (BP Location: Left Arm)   Pulse (!) 43   Temp (!) 97.5 F (36.4 C) (Oral)   Resp 16   Ht 4' 9.5" (1.461 m)   Wt 48.1 kg   SpO2 99%   BMI 22.55 kg/m   Physical Exam: General: NAD, alert, chronically ill-appearing, hard of hearing  Eyes: No drainage noted HENT: moist mucous membranes Cardiovascular: RRR Respiratory: no increased work of breathing noted, not in respiratory distress Abdomen: not distended Neuro: A&Ox4, following commands easily Psych: appropriately answers all questions  Imaging:  I personally reviewed recent imaging.   Assessment & Plan:    Assessment: Patient is a 77 year old female with past medical history of GERD, tobacco use, and seasonal allergies who was admitted on 11/17/2022 for management of right shoulder and left hip pain.  Imaging obtained upon presentation showed large right upper lobe mass in chest with possible invasion of mass into SVC with metastatic disease to liver, kidney, and bone as well as a lytic lesion with pathologic fracture of the lateral right clavicle at the [facility] joint and lytic lesions in the left ilium and sacroiliac joint.  Oncology, pulmonology, and IR were consulted for evaluation.  Patient underwent CT-guided left iliac mass biopsy on 11/18/2022.  Palliative medicine team consulted to assist with complex medical decision making.   Recommendations/Plan: # Complex medical decision making/goals of care:       -Patient still wanting to get [facility] with hospice support as soon as possible.  Patient's caregiver, Ashley [facility], assisting with coordination as patient will be going back to her [facility].                -  Code Status: DNR -Signed DNR form and placed on patient's chart. Prognosis: < 6 months   # Symptom management                -Constipation, chronic                               -Continue MiraLAX 17 g twice daily scheduled                               -  Continue senna 2 tabs twice daily scheduled                  -Pain, metastatic cancer                               -Continue oxycodone 5mg  q4hrs prn                   -Anxiety/Agitation, metastatic cancer                               -Continue Ativan SL 0.5mg  q6hrs prn   # Psycho-social/Spiritual Support:  - Support System: [facility] - Desire for further Chaplain support: Chaplain able to assist with HCPOA completion today naming Ashley [facility] as per patient's stated wishes.   # Discharge Planning:  [facility] with Hospice. Patient planning to return to [facility] with [facility].  Thank you for allowing the palliative care team  to participate in the care Ashley [facility].  Ashley Morin, DO Palliative Care Provider PMT # [phone]  If patient remains symptomatic despite maximum doses, please call PMT at [phone] between 0700 and 1900. Outside of these hours, please call attending, as PMT does not have night coverage.  *Please note that this is a verbal dictation therefore any spelling or grammatical errors are due to the "[employer]" system interpretation.

## 2023-01-13 DEATH — deceased

## 2023-01-27 ENCOUNTER — Ambulatory Visit: Payer: Self-pay | Admitting: Internal Medicine
